# Patient Record
Sex: Female | Born: 1990 | Race: Black or African American | Hispanic: No | Marital: Married | Smoking: Never smoker
Health system: Southern US, Community
[De-identification: ages and names within clinical notes are randomized; demographics above are authoritative.]

## PROBLEM LIST (undated history)

## (undated) DIAGNOSIS — R87619 Unspecified abnormal cytological findings in specimens from cervix uteri: Secondary | ICD-10-CM

## (undated) DIAGNOSIS — F419 Anxiety disorder, unspecified: Secondary | ICD-10-CM

## (undated) DIAGNOSIS — E282 Polycystic ovarian syndrome: Secondary | ICD-10-CM

## (undated) DIAGNOSIS — K219 Gastro-esophageal reflux disease without esophagitis: Secondary | ICD-10-CM

## (undated) DIAGNOSIS — E119 Type 2 diabetes mellitus without complications: Secondary | ICD-10-CM

## (undated) DIAGNOSIS — N926 Irregular menstruation, unspecified: Secondary | ICD-10-CM

## (undated) HISTORY — PX: NO PAST SURGERIES: SHX2092

## (undated) HISTORY — DX: Anxiety disorder, unspecified: F41.9

## (undated) HISTORY — DX: Unspecified abnormal cytological findings in specimens from cervix uteri: R87.619

---
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
## 2008-01-24 ENCOUNTER — Emergency Department (HOSPITAL_COMMUNITY): Admission: EM | Admit: 2008-01-24 | Discharge: 2008-01-24 | Payer: Self-pay | Admitting: Emergency Medicine

## 2008-09-21 ENCOUNTER — Ambulatory Visit: Payer: Self-pay | Admitting: Women's Health

## 2008-10-12 ENCOUNTER — Ambulatory Visit: Payer: Self-pay | Admitting: Women's Health

## 2008-10-12 ENCOUNTER — Other Ambulatory Visit: Admission: RE | Admit: 2008-10-12 | Discharge: 2008-10-12 | Payer: Self-pay | Admitting: Gynecology

## 2008-10-12 ENCOUNTER — Encounter: Payer: Self-pay | Admitting: Women's Health

## 2008-11-09 ENCOUNTER — Ambulatory Visit: Payer: Self-pay | Admitting: Women's Health

## 2009-06-06 ENCOUNTER — Ambulatory Visit: Payer: Self-pay | Admitting: Women's Health

## 2010-01-23 ENCOUNTER — Ambulatory Visit: Payer: Self-pay | Admitting: Internal Medicine

## 2010-05-01 ENCOUNTER — Emergency Department (HOSPITAL_COMMUNITY): Admission: EM | Admit: 2010-05-01 | Discharge: 2010-05-01 | Payer: Self-pay | Admitting: Emergency Medicine

## 2010-11-15 LAB — RAPID STREP SCREEN (MED CTR MEBANE ONLY): Streptococcus, Group A Screen (Direct): NEGATIVE

## 2010-12-31 LAB — HM PAP SMEAR: HM Pap smear: ABNORMAL

## 2011-01-06 ENCOUNTER — Encounter: Payer: Self-pay | Admitting: *Deleted

## 2011-02-20 ENCOUNTER — Emergency Department (HOSPITAL_BASED_OUTPATIENT_CLINIC_OR_DEPARTMENT_OTHER)
Admission: EM | Admit: 2011-02-20 | Discharge: 2011-02-20 | Disposition: A | Discharge status: Home / Self Care | Payer: Self-pay | Attending: Emergency Medicine | Admitting: Emergency Medicine

## 2011-02-20 ENCOUNTER — Emergency Department (INDEPENDENT_AMBULATORY_CARE_PROVIDER_SITE_OTHER): Payer: Self-pay

## 2011-02-20 DIAGNOSIS — R059 Cough, unspecified: Secondary | ICD-10-CM

## 2011-02-20 DIAGNOSIS — J069 Acute upper respiratory infection, unspecified: Secondary | ICD-10-CM | POA: Insufficient documentation

## 2011-02-20 DIAGNOSIS — R0989 Other specified symptoms and signs involving the circulatory and respiratory systems: Secondary | ICD-10-CM

## 2011-02-20 DIAGNOSIS — J329 Chronic sinusitis, unspecified: Secondary | ICD-10-CM

## 2011-02-20 DIAGNOSIS — R05 Cough: Secondary | ICD-10-CM

## 2011-05-28 LAB — POCT URINALYSIS DIP (DEVICE)
Hgb urine dipstick: NEGATIVE
Protein, ur: NEGATIVE
Specific Gravity, Urine: 1.015
Urobilinogen, UA: 0.2
pH: 8.5 — ABNORMAL HIGH

## 2011-06-09 LAB — COMPREHENSIVE METABOLIC PANEL
ALT: 81 — ABNORMAL HIGH
AST: 53 — ABNORMAL HIGH
Alkaline Phosphatase: 77
CO2: 27
GFR calc Af Amer: >60
Glucose, Bld: 101 — ABNORMAL HIGH
Potassium: 3.6
Sodium: 138
Total Protein: 7.6

## 2011-06-09 LAB — DIFFERENTIAL
Basophils Relative: 0
Eosinophils Absolute: 0 — ABNORMAL LOW
Eosinophils Relative: 0
Monocytes Relative: 2 — ABNORMAL LOW
Neutrophils Relative %: 92 — ABNORMAL HIGH

## 2011-06-09 LAB — URINE MICROSCOPIC-ADD ON

## 2011-06-09 LAB — URINALYSIS, ROUTINE W REFLEX MICROSCOPIC
Glucose, UA: NEGATIVE
Hgb urine dipstick: NEGATIVE
Ketones, ur: 15 — AB
Protein, ur: 30 — AB

## 2011-06-09 LAB — CBC
Hemoglobin: 13.3
RBC: 4.84
RDW: 15

## 2011-06-09 LAB — POCT PREGNANCY, URINE
Operator id: 173591
Preg Test, Ur: NEGATIVE

## 2011-07-11 ENCOUNTER — Ambulatory Visit (INDEPENDENT_AMBULATORY_CARE_PROVIDER_SITE_OTHER): Payer: 59 | Admitting: Family

## 2011-07-11 ENCOUNTER — Encounter: Payer: Self-pay | Admitting: Family

## 2011-07-11 ENCOUNTER — Telehealth: Payer: Self-pay | Admitting: Family

## 2011-07-11 ENCOUNTER — Ambulatory Visit: Payer: Self-pay | Admitting: Family

## 2011-07-11 ENCOUNTER — Ambulatory Visit (HOSPITAL_BASED_OUTPATIENT_CLINIC_OR_DEPARTMENT_OTHER)
Admission: RE | Admit: 2011-07-11 | Discharge: 2011-07-11 | Disposition: A | Discharge status: Home / Self Care | Payer: 59 | Source: Ambulatory Visit | Attending: Family | Admitting: Family

## 2011-07-11 DIAGNOSIS — R635 Abnormal weight gain: Secondary | ICD-10-CM

## 2011-07-11 DIAGNOSIS — K219 Gastro-esophageal reflux disease without esophagitis: Secondary | ICD-10-CM

## 2011-07-11 DIAGNOSIS — R141 Gas pain: Secondary | ICD-10-CM | POA: Insufficient documentation

## 2011-07-11 DIAGNOSIS — R112 Nausea with vomiting, unspecified: Secondary | ICD-10-CM

## 2011-07-11 DIAGNOSIS — R142 Eructation: Secondary | ICD-10-CM | POA: Insufficient documentation

## 2011-07-11 DIAGNOSIS — F329 Major depressive disorder, single episode, unspecified: Secondary | ICD-10-CM

## 2011-07-11 DIAGNOSIS — R1013 Epigastric pain: Secondary | ICD-10-CM | POA: Insufficient documentation

## 2011-07-11 DIAGNOSIS — Z0000 Encounter for general adult medical examination without abnormal findings: Secondary | ICD-10-CM

## 2011-07-11 DIAGNOSIS — J45909 Unspecified asthma, uncomplicated: Secondary | ICD-10-CM | POA: Insufficient documentation

## 2011-07-11 DIAGNOSIS — R109 Unspecified abdominal pain: Secondary | ICD-10-CM

## 2011-07-11 DIAGNOSIS — F3289 Other specified depressive episodes: Secondary | ICD-10-CM

## 2011-07-11 DIAGNOSIS — F32A Depression, unspecified: Secondary | ICD-10-CM | POA: Insufficient documentation

## 2011-07-11 DIAGNOSIS — R87619 Unspecified abnormal cytological findings in specimens from cervix uteri: Secondary | ICD-10-CM | POA: Insufficient documentation

## 2011-07-11 DIAGNOSIS — E282 Polycystic ovarian syndrome: Secondary | ICD-10-CM | POA: Insufficient documentation

## 2011-07-11 HISTORY — DX: Unspecified abnormal cytological findings in specimens from cervix uteri: R87.619

## 2011-07-11 LAB — CBC WITH DIFFERENTIAL/PLATELET
Basophils Relative: 0 % (ref 0–1)
Eosinophils Absolute: 0.1 10*3/uL (ref 0.0–0.7)
Eosinophils Relative: 2 % (ref 0–5)
MCH: 26 pg (ref 26.0–34.0)
MCHC: 31.4 g/dL (ref 30.0–36.0)
MCV: 82.6 fL (ref 78.0–100.0)
Neutrophils Relative %: 48 % (ref 43–77)
Platelets: 356 10*3/uL (ref 150–400)
RDW: 14.7 % (ref 11.5–15.5)

## 2011-07-11 MED ORDER — OMEPRAZOLE 40 MG PO CPDR: 40.0000 mg | DELAYED_RELEASE_CAPSULE | Freq: Every day | ORAL | Status: DC

## 2011-07-11 MED ORDER — ALBUTEROL 90 MCG/ACT IN AERS: 2.0000 | INHALATION_SPRAY | Freq: Four times a day (QID) | RESPIRATORY_TRACT | Status: DC | PRN

## 2011-07-11 NOTE — Telephone Encounter (Signed)
Pls let patient know that her ultrasound is normal.  I am still waiting on her blood work.  She should start omeprazole rx and I will make arrangement for her to see GI to further evaluate her pain.

## 2011-07-11 NOTE — Progress Notes (Signed)
Subjective:    Patient ID: Melissa [name], female    DOB: 10-10-1990, 20 y.o.   MRN: [id number]  HPI  Ms.  Melissa [name] is a 20 yr old female who presents today with chief complaint of RUQ pain.  1) RUQ pain-  She feels like the food "just sits."  She reports "sour spit."  Symptoms started about 2-3 weeks ago.  She has tried pepcid Customer service manager and tums.  "nothing is helping." Symptoms are worsening.  She reports that several female family members have had cholecystectomy.  Denies fever.  She has had some associated nausea and vomitting.   She has not had a primary care.  She has followed at [facility]) in [place].    2) Abnormal Pap smear- this was performed by GYN.  They recommended that she follow up but she tells me that she did not because she was "scared."  3)PCOS- she was diagnosed in 2008.  She has been on Metformin since that time.  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  4) Asthma-  She reports that this is well controlled. Exercised induced.  Uses Albuterol MDI PRN.  5) Depression-   She reports that she has been trying to get pregnant since last year.  She is upset about her female issues.  Feels that her depression is situational.  She denies suicidal ideation.  She reports feeling bad about her hirsuitism.    6) + weight gain-  50 pound weight gain in the last 8 months.     Review of Systems  Constitutional: Positive for unexpected weight change.  HENT: Negative for hearing loss.   Eyes: Negative for visual disturbance.  Respiratory: Negative for shortness of breath.   Cardiovascular: Negative for leg swelling.  Gastrointestinal: Positive for abdominal pain.  Genitourinary:       She notes occasional urinary incontinence.    Musculoskeletal: Negative for myalgias and arthralgias.  Skin: Negative for rash.  Neurological: Negative for headaches.  Hematological: Negative for adenopathy.   Past Medical History  Diagnosis Date  . Asthma     activity  induced  . Depression     History   Social History  . Marital Status: Married    Spouse Name: N/A    Number of Children: 0  . Years of Education: N/A   Occupational History  . Not on file.   Social History Main Topics  . Smoking status: Never Smoker   . Smokeless tobacco: Never Used  . Alcohol Use: No  . Drug Use: No  . Sexually Active: Not on file   Other Topics Concern  . Not on file   Social History Narrative   Caffeine use:  1 can soda dailyRegular exercise:  Just started2 sisters.  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 Denies drugs, smoking, or ETOH.    Past Surgical History  Procedure Date  . No past surgeries     Family History  Problem Relation Age of Onset  . Gallbladder disease Mother   . Hypertension Father   . Other Father     Hepatitis A  . Cancer Maternal Grandmother     stomach cancer  . Emphysema Maternal Grandfather   . Heart disease Paternal Grandfather     No Known Allergies  No current outpatient prescriptions on file prior to visit.    BP 100/70  Pulse 66  Temp(Src) 97.8 F (36.6 C) (Oral)  Resp 16  Ht 5\' 3"  (1.6 m)  Wt 210 lb 0.6 oz (95.274 kg)  BMI 37.21 kg/m2  LMP 06/19/2011        Objective:   Physical Exam  Constitutional: She is oriented to person, place, and time. She appears well-developed and well-nourished. No distress.  HENT:  Head: Normocephalic and atraumatic.  Eyes: Conjunctivae are normal. Pupils are equal, round, and reactive to light.  Neck: Normal range of motion. Neck supple. No thyromegaly present.  Cardiovascular: Normal rate and regular rhythm.   No murmur heard. Pulmonary/Chest: Effort normal and breath sounds normal. No respiratory distress. She has no wheezes. She has no rales. She exhibits no tenderness.  Abdominal: Soft. Bowel sounds are normal.       + epigastric tenderness.  + RUQ tenderness    Musculoskeletal: She exhibits no edema.  Neurological: She is alert and oriented to person, place, and time.  Skin: Skin is warm and dry.  Psychiatric:       Slightly flat affect, but appropriate and pleasant.          Assessment & Plan:

## 2011-07-11 NOTE — Assessment & Plan Note (Signed)
I recommended to the patient that she arrange follow up with GYN as recommended.

## 2011-07-11 NOTE — Assessment & Plan Note (Signed)
Lab work pending, ultrasound shows normal gallbladder.  Suspect GERD.  Add PPI and plan to refer to GI as she may also need endoscopy to rule out PUD.

## 2011-07-11 NOTE — Telephone Encounter (Signed)
Call placed to patient [phone], she was advised per Sandford Craze instructions and has verbalized understanding. She is aware the Rx for Omeprazole was sent to pharmacy.

## 2011-07-11 NOTE — Assessment & Plan Note (Signed)
Pt feels that her depression is largely situation due to "female issues" i.e. Infertility, hirsuitism.  I recommended that she see a therapist which she declines at this time.

## 2011-07-11 NOTE — Assessment & Plan Note (Signed)
Exercise induced. Controlled.  Continue PRN albuterol.

## 2011-07-11 NOTE — Patient Instructions (Signed)
Please complete your lab work prior to leaving today. Please schedule your ultrasound on the first floor today. Follow up in 1 month.

## 2011-07-11 NOTE — Assessment & Plan Note (Signed)
Pt is on metformin.  Concerned about fertility issues.  These issues are being managed by OB/GYN.

## 2011-07-11 NOTE — Assessment & Plan Note (Signed)
Check TSH 

## 2011-07-12 LAB — BASIC METABOLIC PANEL
BUN: 9 mg/dL (ref 6–23)
CO2: 28 mEq/L (ref 19–32)
Calcium: 9.1 mg/dL (ref 8.4–10.5)
Chloride: 103 mEq/L (ref 96–112)
Creat: 0.68 mg/dL (ref 0.50–1.10)
Glucose, Bld: 80 mg/dL (ref 70–99)

## 2011-07-12 LAB — TSH: TSH: 1.973 u[IU]/mL (ref 0.350–4.500)

## 2011-07-12 LAB — HEPATIC FUNCTION PANEL
Albumin: 4.5 g/dL (ref 3.5–5.2)
Alkaline Phosphatase: 72 U/L (ref 39–117)
Total Bilirubin: 0.2 mg/dL — ABNORMAL LOW (ref 0.3–1.2)
Total Protein: 7.3 g/dL (ref 6.0–8.3)

## 2011-07-13 ENCOUNTER — Encounter: Payer: Self-pay | Admitting: Family

## 2011-07-14 ENCOUNTER — Ambulatory Visit: Payer: Self-pay | Admitting: Family

## 2011-08-04 ENCOUNTER — Ambulatory Visit: Payer: 59 | Admitting: Gastroenterology

## 2011-08-11 ENCOUNTER — Ambulatory Visit: Payer: 59 | Admitting: Family

## 2012-01-27 ENCOUNTER — Encounter (HOSPITAL_COMMUNITY): Payer: Self-pay

## 2012-01-27 ENCOUNTER — Emergency Department (HOSPITAL_COMMUNITY)
Admission: EM | Admit: 2012-01-27 | Discharge: 2012-01-27 | Disposition: A | Discharge status: Home / Self Care | Payer: 59 | Attending: Emergency Medicine | Admitting: Emergency Medicine

## 2012-01-27 DIAGNOSIS — K529 Noninfective gastroenteritis and colitis, unspecified: Secondary | ICD-10-CM

## 2012-01-27 DIAGNOSIS — N390 Urinary tract infection, site not specified: Secondary | ICD-10-CM | POA: Insufficient documentation

## 2012-01-27 DIAGNOSIS — K5289 Other specified noninfective gastroenteritis and colitis: Secondary | ICD-10-CM | POA: Insufficient documentation

## 2012-01-27 HISTORY — DX: Polycystic ovarian syndrome: E28.2

## 2012-01-27 HISTORY — DX: Gastro-esophageal reflux disease without esophagitis: K21.9

## 2012-01-27 HISTORY — DX: Irregular menstruation, unspecified: N92.6

## 2012-01-27 LAB — CBC
HCT: 37.9 % (ref 36.0–46.0)
Hemoglobin: 12.5 g/dL (ref 12.0–15.0)
MCHC: 33 g/dL (ref 30.0–36.0)
RDW: 14.9 % (ref 11.5–15.5)
WBC: 5.4 10*3/uL (ref 4.0–10.5)

## 2012-01-27 LAB — DIFFERENTIAL
Basophils Absolute: 0 10*3/uL (ref 0.0–0.1)
Basophils Relative: 0 % (ref 0–1)
Lymphocytes Relative: 45 % (ref 12–46)
Monocytes Absolute: 0.6 10*3/uL (ref 0.1–1.0)
Monocytes Relative: 10 % (ref 3–12)
Neutro Abs: 2.4 10*3/uL (ref 1.7–7.7)
Neutrophils Relative %: 44 % (ref 43–77)

## 2012-01-27 LAB — URINALYSIS, ROUTINE W REFLEX MICROSCOPIC
Nitrite: NEGATIVE
Protein, ur: 30 mg/dL — AB
Specific Gravity, Urine: 1.036 — ABNORMAL HIGH (ref 1.005–1.030)
Urobilinogen, UA: 1 mg/dL (ref 0.0–1.0)

## 2012-01-27 LAB — COMPREHENSIVE METABOLIC PANEL
ALT: 16 U/L (ref 0–35)
BUN: 11 mg/dL (ref 6–23)
CO2: 23 mEq/L (ref 19–32)
Calcium: 9.1 mg/dL (ref 8.4–10.5)
Chloride: 102 mEq/L (ref 96–112)
Creatinine, Ser: 0.65 mg/dL (ref 0.50–1.10)
GFR calc Af Amer: >90 mL/min (ref >90)
Potassium: 3.4 mEq/L — ABNORMAL LOW (ref 3.5–5.1)
Sodium: 139 mEq/L (ref 135–145)
Total Protein: 7.6 g/dL (ref 6.0–8.3)

## 2012-01-27 LAB — URINE MICROSCOPIC-ADD ON

## 2012-01-27 LAB — POCT PREGNANCY, URINE: Preg Test, Ur: NEGATIVE

## 2012-01-27 LAB — LIPASE, BLOOD: Lipase: 32 U/L (ref 11–59)

## 2012-01-27 MED ORDER — CEPHALEXIN 500 MG PO CAPS: 500.0000 mg | ORAL_CAPSULE | Freq: Four times a day (QID) | ORAL | Status: AC

## 2012-01-27 MED ORDER — SODIUM CHLORIDE 0.9 % IV SOLN
INTRAVENOUS | Status: DC
  Administered 2012-01-27 (×2): via INTRAVENOUS

## 2012-01-27 MED ORDER — PROMETHAZINE HCL 25 MG RE SUPP: 25.0000 mg | Freq: Four times a day (QID) | RECTAL | Status: DC | PRN

## 2012-01-27 MED ORDER — ONDANSETRON HCL 4 MG/2ML IJ SOLN
4.0000 mg | Freq: Once | INTRAMUSCULAR | Status: AC
  Administered 2012-01-27: 4 mg via INTRAVENOUS
  Filled 2012-01-27: qty 2

## 2012-01-27 MED ORDER — ONDANSETRON HCL 4 MG/2ML IJ SOLN
4.0000 mg | INTRAMUSCULAR | Status: DC | PRN
  Administered 2012-01-27: 4 mg via INTRAVENOUS
  Filled 2012-01-27: qty 2

## 2012-01-27 MED ORDER — DEXTROSE 5 % IV SOLN
1.0000 g | Freq: Once | INTRAVENOUS | Status: AC
  Administered 2012-01-27: 1 g via INTRAVENOUS
  Filled 2012-01-27: qty 10

## 2012-01-27 MED ORDER — SODIUM CHLORIDE 0.9 % IV BOLUS (SEPSIS)
2000.0000 mL | Freq: Once | INTRAVENOUS | Status: AC
  Administered 2012-01-27: 2000 mL via INTRAVENOUS

## 2012-01-27 NOTE — ED Notes (Signed)
Pa in to discuss results and discharge plan with with pt and family

## 2012-01-27 NOTE — ED Provider Notes (Signed)
Patient  was placed in CDU on observation by Dr. Fonnie Jarvis. Patient care resumed from blue side .  Patient is here for fluid hydration with labs pending and has received 1Lbolus. While in obeservation over night the pt slept well and had no complaints, per nursing staff. Patient re-evaluated and is resting comfortable, VSS, with no new complaints or concerns at this time. Plan per previous provider is to dc pt id can tolerate PO challenge. On exam: hemodynamically stable, NAD, heart w/ RRR, lungs CTAB, Chest & abd diffuse mild tenderness, no peripheral edema or calf tenderness.  12:01 PM  Patient with symptoms consistent with viral gastroenteritis.  Vitals are stable, no fever.  No signs of dehydration, tolerating PO fluids > 6 oz.  Lungs are clear.  No focal abdominal pain, no concern for appendicitis, cholecystitis, pancreatitis, ruptured viscus, UTI, kidney stone, or any other abdominal etiology.  Supportive therapy indicated with return if symptoms worsen.  Patient counseled. In addition Pt has been diagnosed with a UTI. Pt is afebrile, no CVA tenderness, normotensive, and denies N/V. Pt has been given 1 g rocephin & will to be dc home with antibiotics and instructions to follow up with PCP if symptoms persist.       Jaci Carrel, PA-C 01/27/12 1202

## 2012-01-27 NOTE — ED Notes (Signed)
Pt family has returned and is requesting soda etc.

## 2012-01-27 NOTE — ED Notes (Signed)
IV attempt x 2 unsuccessful.

## 2012-01-27 NOTE — ED Notes (Signed)
Patient has arrived in cdu. Her iv fluids have been placed on pump. Site asymptomatic. States still with some nausea, last emesis around 4 am, last diarrhea around 8 am states stool liquid yellow. States she did not feel well Saturday. States onset vomiting and diarrhea Sunday evening. States a lot of vomiting and diarrhea yesterday. States has had dry heaves this morning. States at this time she does feel some better. Patient has ambulated to the restroom and collected urine sample. Sample cloudy. Pt made comfortable in room,.

## 2012-01-27 NOTE — Discharge Instructions (Signed)
Follow up with your primary care doctor about your hospital visit. Continue to hydrate orally.Take all medications as prescribed & use Zofran as directed for nausea & vomiting.  Read the instructions below for reasons to return to the ER.  ° °The 'BRAT' diet is suggested, then progress to diet as tolerated as symptoms abate. Call if bloody stools, persistent diarrhea, vomiting, fever or abdominal pain. °Bananas.  °Rice.  °Applesauce.  °Toast (and other simple starches such as crackers, potatoes, noodles).  ° °SEEK IMMEDIATE MEDICAL ATTENTION IF: ° °You begin having localized abdominal pain that does not go away or becomes severe (The right side could  possibly be appendicitis. In an adult, the left lower portion of the abdomen could be colitis or diverticulitis) °  °A temperature above 101 develops ° °Repeated vomiting occurs (multiple uncontrollable episodes) or you are unable to keep fluids down ° °Blood is being passed in stools or vomit (bright red or black tarry stools).  ° °Return also if you develop chest pain, difficulty breathing, dizziness or fainting, or become confused, poorly responsive, or inconsolable (young children). ° ° °RESOURCE GUIDE ° °Dental Problems ° °Patients with Medicaid: °[facility]                     [facility] °[address]                                           [address] °Phone:  [phone]                                                  Phone:  510-2600 ° °If unable to pay or uninsured, contact:  [facility] or [facility] to become qualified for the adult dental clinic. ° °Chronic Pain Problems °Contact [facility]  [phone] °Patients need to be referred by their primary care doctor. ° °Insufficient Money for Medicine °Contact [employer]:  call "211" or [facility] 271-5999. ° °No Primary Care Doctor °Call [facility]  832-8000 °Other agencies that provide inexpensive medical care °   [facility]  [phone] °   [facility]  [phone] °   [facility]  [phone] °   [facility]  [phone] °   [facility]  [phone] °   [facility]  [phone] ° °Psychological Services °[facility]  [phone] °[facility]  [phone] °[facility]   [phone] (emergency services [phone]) ° °Substance Abuse Resources °[facility]  336-882-2125 °[facility] 336-784-9470 °[facility] 336-285-9073 °[facility] 336-845-3988 °Residential & Outpatient Substance Abuse Program  800-659-3381 ° °Abuse/Neglect °[place] [facility] (336) 641-3795 °[place] [facility] Hotline 800-378-5315 (After Hours) ° °Emergency Shelter °[facility] (336) 271-5985 ° °Maternity Homes °[facility] (336) 275-9566 °[facility] (704) 372-4663 ° °MRSA Hotline #:   832-7006 ° ° ° °[place] [facility] ° °[facility]     [employer]                            [facility] °[address] [place]                       [address]      [address]  °[place]                                                [place]                            [place] °Phone:  349-3220                                   Phone:  [phone]                 Phone:  342-8140 ° °[facility] °Phone:  342-8316 ° °[place] [facility] Hotline °(336) 342-1394 °(336) 342-3537 (After Hours) ° ° ° ° °

## 2012-01-27 NOTE — ED Notes (Signed)
Dr. Bednar at bedside. 

## 2012-01-27 NOTE — ED Notes (Signed)
Family at bedside. 

## 2012-01-27 NOTE — ED Provider Notes (Signed)
History     CSN: [id number]  Arrival date & time 01/27/12  2130   First MD Initiated Contact with Patient 01/27/12 [phone]      Chief Complaint  Patient presents with  . Emesis  . Diarrhea    (Consider location/radiation/quality/duration/timing/severity/associated sxs/prior treatment) HPI This 21 year old female has had nonbloody vomiting and diarrhea several times a day for the last couple of days and today is day 3 of her illness with intermittent diffuse crampy abdominal pains mostly in the epigastric area. She is no fever no body aches no confusion no rash no shortness of breath no vaginal discharge no vaginal bleeding no dysuria. There is no treatment prior to arrival other than attempting to swallow Zofran tablets which she vomited and was not able to hold down. Today she feels as if she has a dry mouth and is a little bit lightheaded and generally weak and dehydrated so came to the ED for rehydration. Past Medical History  Diagnosis Date  . Asthma     activity induced  . Abnormal Pap smear of cervix 07/11/2011  . Polycystic ovarian syndrome   . Irregular menses   . Acid reflux     Past Surgical History  Procedure Date  . No past surgeries     Family History  Problem Relation Age of Onset  . Gallbladder disease Mother   . Hypertension Father   . Other Father     Hepatitis A  . Cancer Maternal Grandmother     stomach cancer  . Emphysema Maternal Grandfather   . Heart disease Paternal Grandfather     History  Substance Use Topics  . Smoking status: Never Smoker   . Smokeless tobacco: Never Used  . Alcohol Use: No    OB History    Grav Para Term Preterm Abortions TAB SAB Ect Mult Living                  Review of Systems  Constitutional: Negative for fever.       10 Systems reviewed and are negative for acute change except as noted in the HPI.  HENT: Negative for congestion.   Eyes: Negative for discharge and redness.  Respiratory: Negative for cough and  shortness of breath.   Cardiovascular: Negative for chest pain.  Gastrointestinal: Positive for nausea, vomiting, abdominal pain and diarrhea.  Genitourinary: Negative for dysuria, vaginal bleeding and vaginal discharge.  Musculoskeletal: Negative for back pain.  Skin: Negative for rash.  Neurological: Negative for syncope, numbness and headaches.  Psychiatric/Behavioral:       No behavior change.    Allergies  Benadryl  Home Medications   Current Outpatient Rx  Name Route Sig Dispense Refill  . LETROZOLE 2.5 MG PO TABS Oral Take 5 mg by mouth daily.    Marland Kitchen OMEPRAZOLE 40 MG PO CPDR Oral Take 1 capsule (40 mg total) by mouth daily. 30 capsule 2  . ALBUTEROL 90 MCG/ACT IN AERS Inhalation Inhale 2 puffs into the lungs every 6 (six) hours as needed for wheezing. 17 g 2  . CEPHALEXIN 500 MG PO CAPS Oral Take 1 capsule (500 mg total) by mouth 4 (four) times daily. 40 capsule 0  . PROMETHAZINE HCL 25 MG RE SUPP Rectal Place 1 suppository (25 mg total) rectally every 6 (six) hours as needed for nausea. 12 each 0    BP 107/72  Pulse 75  Temp(Src) 98.1 F (36.7 C) (Oral)  Resp 16  Ht 5\' 2"  (1.575 m)  Wt  211 lb (95.709 kg)  BMI 38.59 kg/m2  SpO2 100%  LMP 01/17/2012  Physical Exam  Nursing note and vitals reviewed. Constitutional:       Awake, alert, nontoxic appearance.  HENT:  Head: Atraumatic.  Eyes: Right eye exhibits no discharge. Left eye exhibits no discharge.  Neck: Neck supple.  Cardiovascular: Normal rate and regular rhythm.   No murmur heard. Pulmonary/Chest: Effort normal and breath sounds normal. No respiratory distress. She has no wheezes. She has no rales. She exhibits no tenderness.  Abdominal: Soft. Bowel sounds are normal. She exhibits no mass. There is tenderness. There is no rebound and no guarding.       Minimal diffuse tenderness with minimal to mild tenderness in the epigastrium  Genitourinary:       No CVA tenderness  Musculoskeletal: She exhibits no  edema and no tenderness.       Baseline ROM, no obvious new focal weakness.  Neurological:       Mental status and motor strength appears baseline for patient and situation.  Skin: No rash noted.  Psychiatric: She has a normal mood and affect.    ED Course  Procedures (including critical care time) Move to CDU labs pnd. Labs Reviewed  CBC - Abnormal; Notable for the following:    MCH 25.7 (*)    All other components within normal limits  COMPREHENSIVE METABOLIC PANEL - Abnormal; Notable for the following:    Potassium 3.4 (*)    Total Bilirubin 0.2 (*)    All other components within normal limits  URINALYSIS, ROUTINE W REFLEX MICROSCOPIC - Abnormal; Notable for the following:    Color, Urine AMBER (*) BIOCHEMICALS MAY BE AFFECTED BY COLOR   APPearance CLOUDY (*)    Specific Gravity, Urine 1.036 (*)    Hgb urine dipstick TRACE (*)    Bilirubin Urine MODERATE (*)    Ketones, ur 15 (*)    Protein, ur 30 (*)    Leukocytes, UA MODERATE (*)    All other components within normal limits  URINE MICROSCOPIC-ADD ON - Abnormal; Notable for the following:    Squamous Epithelial / LPF MANY (*)    Bacteria, UA FEW (*)    All other components within normal limits  DIFFERENTIAL  LIPASE, BLOOD  POCT PREGNANCY, URINE  LAB REPORT - SCANNED   No results found.   1. UTI (lower urinary tract infection)   2. Gastroenteritis       MDM  Patient / Family / Caregiver understand and agree with initial ED impression and plan with expectations set for ED visit.  Medical screening examination/treatment/procedure(s) were conducted as a shared visit with non-physician practitioner(s) and myself.  I personally evaluated the patient during the encounter      Hurman Horn, MD 01/29/12 [phone]

## 2012-01-27 NOTE — ED Notes (Signed)
Pt sts she has been vomiting and having diarrhea since Sunday. Unable to keep liquids down. Unsure if running a fever. States that she sweats a lot at night while she is sleeping. Cramping pain in stomach from vomiting or diarrhea.

## 2012-01-29 NOTE — ED Provider Notes (Signed)
Medical screening examination/treatment/procedure(s) were conducted as a shared visit with non-physician practitioner(s) and myself.  I personally evaluated the patient during the encounter    Hurman Horn, MD 01/29/12 [phone]

## 2012-08-10 ENCOUNTER — Ambulatory Visit: Payer: 59 | Admitting: Family

## 2012-08-11 ENCOUNTER — Ambulatory Visit: Payer: 59 | Admitting: Family

## 2012-10-03 ENCOUNTER — Encounter (HOSPITAL_BASED_OUTPATIENT_CLINIC_OR_DEPARTMENT_OTHER): Payer: Self-pay | Admitting: *Deleted

## 2012-10-03 ENCOUNTER — Emergency Department (HOSPITAL_BASED_OUTPATIENT_CLINIC_OR_DEPARTMENT_OTHER)
Admission: EM | Admit: 2012-10-03 | Discharge: 2012-10-03 | Disposition: A | Discharge status: Home / Self Care | Payer: 59 | Attending: Emergency Medicine | Admitting: Emergency Medicine

## 2012-10-03 DIAGNOSIS — E282 Polycystic ovarian syndrome: Secondary | ICD-10-CM | POA: Insufficient documentation

## 2012-10-03 DIAGNOSIS — R42 Dizziness and giddiness: Secondary | ICD-10-CM | POA: Insufficient documentation

## 2012-10-03 DIAGNOSIS — R112 Nausea with vomiting, unspecified: Secondary | ICD-10-CM

## 2012-10-03 DIAGNOSIS — Z3202 Encounter for pregnancy test, result negative: Secondary | ICD-10-CM | POA: Insufficient documentation

## 2012-10-03 DIAGNOSIS — R51 Headache: Secondary | ICD-10-CM | POA: Insufficient documentation

## 2012-10-03 DIAGNOSIS — K219 Gastro-esophageal reflux disease without esophagitis: Secondary | ICD-10-CM | POA: Insufficient documentation

## 2012-10-03 DIAGNOSIS — N946 Dysmenorrhea, unspecified: Secondary | ICD-10-CM | POA: Insufficient documentation

## 2012-10-03 DIAGNOSIS — J45909 Unspecified asthma, uncomplicated: Secondary | ICD-10-CM | POA: Insufficient documentation

## 2012-10-03 DIAGNOSIS — Z79899 Other long term (current) drug therapy: Secondary | ICD-10-CM | POA: Insufficient documentation

## 2012-10-03 DIAGNOSIS — R1013 Epigastric pain: Secondary | ICD-10-CM | POA: Insufficient documentation

## 2012-10-03 LAB — URINALYSIS, ROUTINE W REFLEX MICROSCOPIC
Bilirubin Urine: NEGATIVE
Ketones, ur: NEGATIVE mg/dL
Leukocytes, UA: NEGATIVE
Nitrite: NEGATIVE
Protein, ur: 30 mg/dL — AB
pH: 8.5 — ABNORMAL HIGH (ref 5.0–8.0)

## 2012-10-03 LAB — CBC WITH DIFFERENTIAL/PLATELET
Eosinophils Absolute: 0 10*3/uL (ref 0.0–0.7)
Eosinophils Relative: 0 % (ref 0–5)
Lymphs Abs: 1.5 10*3/uL (ref 0.7–4.0)
MCH: 26.2 pg (ref 26.0–34.0)
MCHC: 33.2 g/dL (ref 30.0–36.0)
MCV: 78.7 fL (ref 78.0–100.0)
Monocytes Relative: 6 % (ref 3–12)
Platelets: 362 10*3/uL (ref 150–400)
RBC: 4.89 MIL/uL (ref 3.87–5.11)

## 2012-10-03 LAB — COMPREHENSIVE METABOLIC PANEL
BUN: 10 mg/dL (ref 6–23)
Calcium: 9.4 mg/dL (ref 8.4–10.5)
GFR calc Af Amer: >90 mL/min (ref >90)
Glucose, Bld: 97 mg/dL (ref 70–99)
Sodium: 143 mEq/L (ref 135–145)
Total Protein: 8.2 g/dL (ref 6.0–8.3)

## 2012-10-03 LAB — LIPASE, BLOOD: Lipase: 21 U/L (ref 11–59)

## 2012-10-03 LAB — URINE MICROSCOPIC-ADD ON

## 2012-10-03 LAB — PREGNANCY, URINE: Preg Test, Ur: NEGATIVE

## 2012-10-03 MED ORDER — FAMOTIDINE IN NACL 20-0.9 MG/50ML-% IV SOLN
20.0000 mg | Freq: Once | INTRAVENOUS | Status: AC
  Administered 2012-10-03: 20 mg via INTRAVENOUS
  Filled 2012-10-03: qty 50

## 2012-10-03 MED ORDER — SODIUM CHLORIDE 0.9 % IV BOLUS (SEPSIS)
1000.0000 mL | Freq: Once | INTRAVENOUS | Status: AC
  Administered 2012-10-03: 1000 mL via INTRAVENOUS

## 2012-10-03 MED ORDER — ONDANSETRON 8 MG PO TBDP: 8.0000 mg | ORAL_TABLET | Freq: Three times a day (TID) | ORAL | Status: DC | PRN

## 2012-10-03 MED ORDER — ONDANSETRON HCL 4 MG/2ML IJ SOLN
4.0000 mg | Freq: Once | INTRAMUSCULAR | Status: AC
  Administered 2012-10-03: 4 mg via INTRAVENOUS
  Filled 2012-10-03: qty 2

## 2012-10-03 NOTE — ED Notes (Signed)
Pt states she drank alcohol last night and woke up this a.m. vomiting

## 2012-10-03 NOTE — ED Provider Notes (Signed)
History     CSN: [id number]  Arrival date & time 10/03/12  1423   First MD Initiated Contact with Patient 10/03/12 1436      Chief Complaint  Patient presents with  . Emesis    (Consider location/radiation/quality/duration/timing/severity/associated sxs/prior treatment) HPI Melissa [name] is a 22 y.o. female who presents to ED complaining of nausea, vomiting, abdominal pain. States symptoms began this morning when she woke up. Pt admits to drinking alcohol lastnight. States normally does not drink. Had 3 martinis at dinner and two vodka cocktails after. State doe snot remember what happened after this. This morning states nauseated, unable to eat or drink, states upper abdomina pain. States has hx of indigestion and hx of gallbladder problems. States no surgeries. States tried taking phenergan, but vomited.   Past Medical History  Diagnosis Date  . Asthma     activity induced  . Abnormal Pap smear of cervix 07/11/2011  . Polycystic ovarian syndrome   . Irregular menses   . Acid reflux     Past Surgical History  Procedure Date  . No past surgeries     Family History  Problem Relation Age of Onset  . Gallbladder disease Mother   . Hypertension Father   . Other Father     Hepatitis A  . Cancer Maternal Grandmother     stomach cancer  . Emphysema Maternal Grandfather   . Heart disease Paternal Grandfather     History  Substance Use Topics  . Smoking status: Never Smoker   . Smokeless tobacco: Never Used  . Alcohol Use: No    OB History    Grav Para Term Preterm Abortions TAB SAB Ect Mult Living                  Review of Systems  Constitutional: Negative for fever and chills.  Respiratory: Negative.   Cardiovascular: Negative.   Gastrointestinal: Positive for nausea, vomiting and abdominal pain. Negative for diarrhea.  Genitourinary: Negative for dysuria and flank pain.  Musculoskeletal: Positive for myalgias.  Skin: Negative.   Neurological: Positive for  dizziness, light-headedness and headaches.    Allergies  Benadryl  Home Medications   Current Outpatient Rx  Name  Route  Sig  Dispense  Refill  . ALBUTEROL 90 MCG/ACT IN AERS   Inhalation   Inhale 2 puffs into the lungs every 6 (six) hours as needed for wheezing.   17 g   2   . LETROZOLE 2.5 MG PO TABS   Oral   Take 5 mg by mouth daily.         Marland Kitchen OMEPRAZOLE 40 MG PO CPDR   Oral   Take 1 capsule (40 mg total) by mouth daily.   30 capsule   2   . PROMETHAZINE HCL 25 MG RE SUPP   Rectal   Place 1 suppository (25 mg total) rectally every 6 (six) hours as needed for nausea.   12 each   0     BP 126/77  Pulse 94  Temp 98.5 F (36.9 C) (Oral)  Resp 18  Ht 5' 1.75" (1.568 m)  Wt 209 lb (94.802 kg)  BMI 38.54 kg/m2  SpO2 99%  LMP 07/03/2012  Physical Exam  Nursing note and vitals reviewed. Constitutional: She is oriented to person, place, and time. She appears well-developed and well-nourished. No distress.  HENT:  Head: Normocephalic and atraumatic.  Neck: Neck supple.  Cardiovascular: Normal rate, regular rhythm and normal heart sounds.   Pulmonary/Chest:  Effort normal and breath sounds normal. No respiratory distress. She has no wheezes. She has no rales.  Abdominal: Soft. Bowel sounds are normal. She exhibits no distension. There is tenderness. There is no rebound and no guarding.       Epigastric tenderenss  Musculoskeletal: She exhibits no edema.  Neurological: She is alert and oriented to person, place, and time.  Skin: Skin is warm and dry.    ED Course  Procedures (including critical care time)  Pt with epigastric abdominal pain. Alcohol intake last night, more than usual. Hx of gallstones.  Will get labs. Fluids. zofran IV.   Results for orders placed during the hospital encounter of 10/03/12  URINALYSIS, ROUTINE W REFLEX MICROSCOPIC      Component Value Range   Color, Urine YELLOW  YELLOW   APPearance CLOUDY (*) CLEAR   Specific Gravity,  Urine 1.019  1.005 - 1.030   pH 8.5 (*) 5.0 - 8.0   Glucose, UA NEGATIVE  NEGATIVE mg/dL   Hgb urine dipstick NEGATIVE  NEGATIVE   Bilirubin Urine NEGATIVE  NEGATIVE   Ketones, ur NEGATIVE  NEGATIVE mg/dL   Protein, ur 30 (*) NEGATIVE mg/dL   Urobilinogen, UA 0.2  0.0 - 1.0 mg/dL   Nitrite NEGATIVE  NEGATIVE   Leukocytes, UA NEGATIVE  NEGATIVE  PREGNANCY, URINE      Component Value Range   Preg Test, Ur NEGATIVE  NEGATIVE  CBC WITH DIFFERENTIAL      Component Value Range   WBC 7.8  4.0 - 10.5 K/uL   RBC 4.89  3.87 - 5.11 MIL/uL   Hemoglobin 12.8  12.0 - 15.0 g/dL   HCT 16.1  09.6 - 04.5 %   MCV 78.7  78.0 - 100.0 fL   MCH 26.2  26.0 - 34.0 pg   MCHC 33.2  30.0 - 36.0 g/dL   RDW 40.9  81.1 - 91.4 %   Platelets 362  150 - 400 K/uL   Neutrophils Relative 75  43 - 77 %   Neutro Abs 5.9  1.7 - 7.7 K/uL   Lymphocytes Relative 19  12 - 46 %   Lymphs Abs 1.5  0.7 - 4.0 K/uL   Monocytes Relative 6  3 - 12 %   Monocytes Absolute 0.4  0.1 - 1.0 K/uL   Eosinophils Relative 0  0 - 5 %   Eosinophils Absolute 0.0  0.0 - 0.7 K/uL   Basophils Relative 0  0 - 1 %   Basophils Absolute 0.0  0.0 - 0.1 K/uL  COMPREHENSIVE METABOLIC PANEL      Component Value Range   Sodium 143  135 - 145 mEq/L   Potassium 3.8  3.5 - 5.1 mEq/L   Chloride 104  96 - 112 mEq/L   CO2 26  19 - 32 mEq/L   Glucose, Bld 97  70 - 99 mg/dL   BUN 10  6 - 23 mg/dL   Creatinine, Ser 7.82  0.50 - 1.10 mg/dL   Calcium 9.4  8.4 - 95.6 mg/dL   Total Protein 8.2  6.0 - 8.3 g/dL   Albumin 4.3  3.5 - 5.2 g/dL   AST 18  0 - 37 U/L   ALT 22  0 - 35 U/L   Alkaline Phosphatase 77  39 - 117 U/L   Total Bilirubin 0.1 (*) 0.3 - 1.2 mg/dL   GFR calc non Af Amer >90  >90 mL/min   GFR calc Af Amer >90  >90  mL/min  LIPASE, BLOOD      Component Value Range   Lipase 21  11 - 59 U/L  URINE MICROSCOPIC-ADD ON      Component Value Range   Squamous Epithelial / LPF FEW (*) RARE   WBC, UA 3-6  <3 WBC/hpf   RBC / HPF 0-2  <3 RBC/hpf    No results found.  4:40 PM Pt feeling better. Abdominal pain and nausea resolved. Tolerating POs. Will d/c home.    1. Nausea & vomiting   2. Epigastric pain       MDM  Pt with nausea, vomiting, epigastric pain after having more alcohol than usual. Headache. Exam unremarkable other than mild epigastric tenderness, with no guarding or rebound. UA, labs all normal. I treated her in ED with IV fluids, zofran, pepcid. Her symptoms all resolved. She is tolerating PO fluids. Her abdomen remains soft, now non tender. i do believe this is most likely alcoholic gastritis. Will d/c home with anti emetics and follow up.   Filed Vitals:   10/03/12 1429 10/03/12 1522 10/03/12 1523 10/03/12 1600  BP: 126/77 105/58  104/52  Pulse: 94  88   Temp: 98.5 F (36.9 C)     TempSrc: Oral     Resp: 18     Height: 5' 1.75" (1.568 m)     Weight: 209 lb (94.802 kg)     SpO2: 99%  100%          Lottie Mussel, PA 10/03/12 1645

## 2012-10-04 NOTE — ED Provider Notes (Signed)
Medical screening examination/treatment/procedure(s) were performed by non-physician practitioner and as supervising physician I was immediately available for consultation/collaboration.    Nelia Shi, MD 10/04/12 2230

## 2012-11-22 ENCOUNTER — Other Ambulatory Visit: Payer: Self-pay | Admitting: Obstetrics and Gynecology

## 2012-11-23 LAB — PAP IG, CT-NG, RFX HPV ASCU: GC Probe Amp: NEGATIVE

## 2012-11-24 LAB — HUMAN PAPILLOMAVIRUS, HIGH RISK: HPV DNA High Risk: NOT DETECTED

## 2013-02-04 ENCOUNTER — Ambulatory Visit: Payer: 59 | Admitting: Family

## 2013-02-07 ENCOUNTER — Ambulatory Visit: Payer: 59 | Admitting: Family

## 2013-02-11 ENCOUNTER — Ambulatory Visit (INDEPENDENT_AMBULATORY_CARE_PROVIDER_SITE_OTHER): Payer: 59 | Admitting: Family

## 2013-02-11 ENCOUNTER — Encounter: Payer: Self-pay | Admitting: Family

## 2013-02-11 VITALS — BP 102/70 | HR 69 | Temp 98.3°F | Resp 16 | Wt 211.0 lb

## 2013-02-11 DIAGNOSIS — D229 Melanocytic nevi, unspecified: Secondary | ICD-10-CM | POA: Insufficient documentation

## 2013-02-11 DIAGNOSIS — K219 Gastro-esophageal reflux disease without esophagitis: Secondary | ICD-10-CM | POA: Insufficient documentation

## 2013-02-11 DIAGNOSIS — IMO0001 Reserved for inherently not codable concepts without codable children: Secondary | ICD-10-CM

## 2013-02-11 DIAGNOSIS — D239 Other benign neoplasm of skin, unspecified: Secondary | ICD-10-CM

## 2013-02-11 DIAGNOSIS — R635 Abnormal weight gain: Secondary | ICD-10-CM

## 2013-02-11 MED ORDER — PANTOPRAZOLE SODIUM 40 MG PO TBEC: 40.0000 mg | DELAYED_RELEASE_TABLET | Freq: Every day | ORAL | Status: DC

## 2013-02-11 NOTE — Assessment & Plan Note (Signed)
Will refer to dermatology

## 2013-02-11 NOTE — Progress Notes (Signed)
  Subjective:    Patient ID: Melissa [name], female    DOB: 02/05/1991, 22 y.o.   MRN: [id number]  HPI  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  Reports that the lesion "hurts."  2) Weight gain- reports 20 pound weight gain in last 7 months.  Would like diabetes testing. She reports poor diet and lack of exercise.    3) GERD- reports that she is taking OTC prilosec 2 tabs daily.  Has gerd symptoms "all day."  Review of Systems    see HPI  Past Medical History  Diagnosis Date  . Asthma     activity induced  . Abnormal Pap smear of cervix 07/11/2011  . Polycystic ovarian syndrome   . Irregular menses   . Acid reflux     History   Social History  . Marital Status: Married    Spouse Name: N/A    Number of Children: 0  . Years of Education: N/A   Occupational History  . Not on file.   Social History Main Topics  . Smoking status: Never Smoker   . Smokeless tobacco: Never Used  . Alcohol Use: No  . Drug Use: No  . Sexually Active: Yes    Birth Control/ Protection: None   Other Topics Concern  . Not on file   Social History Narrative   Caffeine use:  1 can soda daily   Regular exercise:  Just started   2 sisters.     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   Married   No children.    Denies drugs, smoking, or ETOH.          Past Surgical History  Procedure Laterality Date  . No past surgeries      Family History  Problem Relation Age of Onset  . Gallbladder disease Mother   . Hypertension Father   . Other Father     Hepatitis A  . Cancer Maternal Grandmother     stomach cancer  . Emphysema Maternal Grandfather   . Heart disease Paternal Grandfather     Allergies  Allergen Reactions  . Benadryl (Diphenhydramine Hcl) Other (See Comments)    Heart races     Current Outpatient Prescriptions on File Prior to Visit  Medication Sig Dispense Refill  . letrozole (FEMARA) 2.5 MG tablet Take 5 mg by mouth daily.      Marland Kitchen albuterol (PROVENTIL,VENTOLIN) 90 MCG/ACT inhaler Inhale 2 puffs into the lungs every 6 (six) hours as needed for wheezing.  17 g  2  . omeprazole (PRILOSEC) 40 MG capsule Take 1 capsule (40 mg total) by mouth daily.  30 capsule  2   No current facility-administered medications on file prior to visit.    BP 102/70  Pulse 69  Temp(Src) 98.3 F (36.8 C) (Oral)  Resp 16  Wt 211 lb 0.6 oz (95.727 kg)  BMI 38.94 kg/m2  SpO2 99%    Objective:   Physical Exam  Constitutional: She appears well-developed and well-nourished.  Skin:  Approximately 1 cm wide raised, irregular lesion noted on scalp.          Assessment & Plan:

## 2013-02-11 NOTE — Assessment & Plan Note (Signed)
Check TSH, A1C.

## 2013-02-11 NOTE — Assessment & Plan Note (Signed)
Deteriorated. Switch to trial of protonix.

## 2013-02-11 NOTE — Patient Instructions (Signed)
Call if reflux symptoms are not improved in 1 month. You will be contacted about your referral to dermatology. Please let us know if you have not heard back within 1 week about your referral. Complete your lab work prior to leaving.

## 2013-09-01 LAB — HM PAP SMEAR

## 2013-10-02 ENCOUNTER — Emergency Department (HOSPITAL_COMMUNITY)
Admission: EM | Admit: 2013-10-02 | Discharge: 2013-10-02 | Disposition: A | Discharge status: Home / Self Care | Payer: 59 | Attending: Emergency Medicine | Admitting: Emergency Medicine

## 2013-10-02 ENCOUNTER — Encounter (HOSPITAL_COMMUNITY): Payer: Self-pay | Admitting: Emergency Medicine

## 2013-10-02 DIAGNOSIS — R112 Nausea with vomiting, unspecified: Secondary | ICD-10-CM

## 2013-10-02 DIAGNOSIS — R1013 Epigastric pain: Secondary | ICD-10-CM

## 2013-10-02 DIAGNOSIS — Z862 Personal history of diseases of the blood and blood-forming organs and certain disorders involving the immune mechanism: Secondary | ICD-10-CM | POA: Insufficient documentation

## 2013-10-02 DIAGNOSIS — J45909 Unspecified asthma, uncomplicated: Secondary | ICD-10-CM | POA: Insufficient documentation

## 2013-10-02 DIAGNOSIS — K219 Gastro-esophageal reflux disease without esophagitis: Secondary | ICD-10-CM | POA: Insufficient documentation

## 2013-10-02 DIAGNOSIS — J029 Acute pharyngitis, unspecified: Secondary | ICD-10-CM | POA: Insufficient documentation

## 2013-10-02 DIAGNOSIS — J3489 Other specified disorders of nose and nasal sinuses: Secondary | ICD-10-CM | POA: Insufficient documentation

## 2013-10-02 DIAGNOSIS — Z8639 Personal history of other endocrine, nutritional and metabolic disease: Secondary | ICD-10-CM | POA: Insufficient documentation

## 2013-10-02 DIAGNOSIS — Z8742 Personal history of other diseases of the female genital tract: Secondary | ICD-10-CM | POA: Insufficient documentation

## 2013-10-02 DIAGNOSIS — R197 Diarrhea, unspecified: Secondary | ICD-10-CM | POA: Insufficient documentation

## 2013-10-02 DIAGNOSIS — Z79899 Other long term (current) drug therapy: Secondary | ICD-10-CM | POA: Insufficient documentation

## 2013-10-02 DIAGNOSIS — Z3202 Encounter for pregnancy test, result negative: Secondary | ICD-10-CM | POA: Insufficient documentation

## 2013-10-02 LAB — COMPREHENSIVE METABOLIC PANEL
ALT: 23 U/L (ref 0–35)
AST: 21 U/L (ref 0–37)
Albumin: 4.2 g/dL (ref 3.5–5.2)
Alkaline Phosphatase: 84 U/L (ref 39–117)
BILIRUBIN TOTAL: 0.2 mg/dL — AB (ref 0.3–1.2)
BUN: 8 mg/dL (ref 6–23)
CHLORIDE: 100 meq/L (ref 96–112)
CO2: 24 meq/L (ref 19–32)
Calcium: 9.2 mg/dL (ref 8.4–10.5)
Creatinine, Ser: 0.52 mg/dL (ref 0.50–1.10)
GLUCOSE: 87 mg/dL (ref 70–99)
Potassium: 4.2 mEq/L (ref 3.7–5.3)
SODIUM: 141 meq/L (ref 137–147)
Total Protein: 8.4 g/dL — ABNORMAL HIGH (ref 6.0–8.3)

## 2013-10-02 LAB — URINALYSIS, ROUTINE W REFLEX MICROSCOPIC
Bilirubin Urine: NEGATIVE
GLUCOSE, UA: NEGATIVE mg/dL
HGB URINE DIPSTICK: NEGATIVE
Ketones, ur: NEGATIVE mg/dL
LEUKOCYTES UA: NEGATIVE
Nitrite: NEGATIVE
PH: 8.5 — AB (ref 5.0–8.0)
PROTEIN: NEGATIVE mg/dL
SPECIFIC GRAVITY, URINE: 1.016 (ref 1.005–1.030)
Urobilinogen, UA: 0.2 mg/dL (ref 0.0–1.0)

## 2013-10-02 LAB — CBC WITH DIFFERENTIAL/PLATELET
BASOS ABS: 0 10*3/uL (ref 0.0–0.1)
Basophils Relative: 0 % (ref 0–1)
Eosinophils Absolute: 0 10*3/uL (ref 0.0–0.7)
Eosinophils Relative: 1 % (ref 0–5)
HEMATOCRIT: 39.3 % (ref 36.0–46.0)
Hemoglobin: 13.3 g/dL (ref 12.0–15.0)
LYMPHS ABS: 1.8 10*3/uL (ref 0.7–4.0)
LYMPHS PCT: 29 % (ref 12–46)
MCH: 27 pg (ref 26.0–34.0)
MCHC: 33.8 g/dL (ref 30.0–36.0)
MCV: 79.9 fL (ref 78.0–100.0)
MONO ABS: 0.4 10*3/uL (ref 0.1–1.0)
Monocytes Relative: 6 % (ref 3–12)
NEUTROS ABS: 4 10*3/uL (ref 1.7–7.7)
Neutrophils Relative %: 64 % (ref 43–77)
Platelets: 339 10*3/uL (ref 150–400)
RBC: 4.92 MIL/uL (ref 3.87–5.11)
RDW: 15.5 % (ref 11.5–15.5)
WBC: 6.2 10*3/uL (ref 4.0–10.5)

## 2013-10-02 LAB — RAPID STREP SCREEN (MED CTR MEBANE ONLY): Streptococcus, Group A Screen (Direct): NEGATIVE

## 2013-10-02 LAB — LIPASE, BLOOD: Lipase: 24 U/L (ref 11–59)

## 2013-10-02 LAB — POCT PREGNANCY, URINE: Preg Test, Ur: NEGATIVE

## 2013-10-02 MED ORDER — MORPHINE SULFATE 4 MG/ML IJ SOLN
2.0000 mg | Freq: Once | INTRAMUSCULAR | Status: AC
Start: 2013-10-02 — End: 2013-10-02
  Administered 2013-10-02: 2 mg via INTRAVENOUS
  Filled 2013-10-02: qty 1

## 2013-10-02 MED ORDER — ONDANSETRON HCL 4 MG/2ML IJ SOLN
4.0000 mg | Freq: Once | INTRAMUSCULAR | Status: AC
  Administered 2013-10-02: 4 mg via INTRAVENOUS
  Filled 2013-10-02: qty 2

## 2013-10-02 MED ORDER — GI COCKTAIL ~~LOC~~
30.0000 mL | Freq: Once | ORAL | Status: AC
  Administered 2013-10-02: 30 mL via ORAL
  Filled 2013-10-02: qty 30

## 2013-10-02 MED ORDER — SODIUM CHLORIDE 0.9 % IV BOLUS (SEPSIS)
1000.0000 mL | Freq: Once | INTRAVENOUS | Status: AC
  Administered 2013-10-02: 1000 mL via INTRAVENOUS

## 2013-10-02 NOTE — ED Provider Notes (Signed)
CSN: [id number]     Arrival date & time 10/02/13  1111 History   First MD Initiated Contact with Patient 10/02/13 1223     Chief Complaint  Patient presents with  . Emesis    HPI  Melissa [name] is a 23 y.o. female with a PMH of asthma, PCOS, irregular mensis, and acid reflux who presents to the ED for evaluation of emesis.  History was provided by the patient.  Patient states that she had vomiting, nausea, and diarrhea which started this morning.  She had approximately 5 episodes of vomiting today which was described as yellow with no hematemesis.  She also had yellow diarrhea this morning with no hematochezia.  She complains of intermittent epigastric abdominal pain which is worse prior to emesis and relieved after vomiting.  She states she has a hx of reflux and this pain is similar to her reflux in the past but worse. She took anti-nausea medication and her omeprazole with no relief in her symptoms. She denies any dysuria, vaginal bleeding/discharge, or genital sores/odors. She also admits to drinking two alcoholic beverages last night. No previous abdominal surgeries.   She denies any back pain, fevers, chills, cough, chest pain or SOB. She states she is developing a sore throat and rhinorrhea and thinks she may be getting a cold.    Past Medical History  Diagnosis Date  . Asthma     activity induced  . Abnormal Pap smear of cervix 07/11/2011  . Polycystic ovarian syndrome   . Irregular menses   . Acid reflux    Past Surgical History  Procedure Laterality Date  . No past surgeries     Family History  Problem Relation Age of Onset  . Gallbladder disease Mother   . Hypertension Father   . Other Father     Hepatitis A  . Cancer Maternal Grandmother     stomach cancer  . Emphysema Maternal Grandfather   . Heart disease Paternal Grandfather    History  Substance Use Topics  . Smoking status: Never Smoker   . Smokeless tobacco: Never Used  . Alcohol Use: No   OB History   Grav  Para Term Preterm Abortions TAB SAB Ect Mult Living                 Review of Systems  Constitutional: Negative for fever, chills, diaphoresis, activity change, appetite change and fatigue.  HENT: Positive for congestion, rhinorrhea and sore throat. Negative for ear pain.   Respiratory: Negative for cough and shortness of breath.   Cardiovascular: Negative for chest pain and leg swelling.  Gastrointestinal: Positive for nausea, vomiting, abdominal pain and diarrhea. Negative for constipation, blood in stool, anal bleeding and rectal pain.  Genitourinary: Negative for dysuria, hematuria, vaginal bleeding, vaginal discharge, difficulty urinating and vaginal pain.  Musculoskeletal: Negative for back pain and myalgias.  Skin: Negative for color change and wound.  Neurological: Negative for dizziness, syncope, weakness, light-headedness, numbness and headaches.    Allergies  Benadryl  Home Medications   Current Outpatient Rx  Name  Route  Sig  Dispense  Refill  . omeprazole (PRILOSEC) 20 MG capsule   Oral   Take 20 mg by mouth daily.          BP 112/77  Pulse 91  Temp(Src) 97.8 F (36.6 C) (Oral)  Resp 18  SpO2 99%  LMP 03/01/2013  Filed Vitals:   10/02/13 1135 10/02/13 1309 10/02/13 1401 10/02/13 1403  BP: 112/77 111/77 100/62  Pulse: 91 90  91  Temp: 97.8 F (36.6 C)     TempSrc: Oral     Resp: 18     SpO2: 99% 100%  100%    Physical Exam  Nursing note and vitals reviewed. Constitutional: She is oriented to person, place, and time. She appears well-developed and well-nourished. No distress.  HENT:  Head: Normocephalic and atraumatic.  Right Ear: External ear normal.  Left Ear: External ear normal.  Nose: Nose normal.  Mouth/Throat: Oropharynx is clear and moist. No oropharyngeal exudate.  Mild erythema to the posterior pharynx. No exudates or edema. No trismus. Uvula midline. Tympanic membranes gray and translucent bilaterally with no erythema, edema, or  hemotympanum.   Eyes: Conjunctivae are normal. Pupils are equal, round, and reactive to light. Right eye exhibits no discharge. Left eye exhibits no discharge.  Neck: Normal range of motion. Neck supple.  Cardiovascular: Normal rate, regular rhythm and normal heart sounds.  Exam reveals no gallop and no friction rub.   No murmur heard. Pulmonary/Chest: Effort normal and breath sounds normal. No respiratory distress. She has no wheezes. She has no rales. She exhibits no tenderness.  Abdominal: Soft. Bowel sounds are normal. She exhibits no distension and no mass. There is no tenderness. There is no rebound and no guarding.  Musculoskeletal: Normal range of motion. She exhibits no edema and no tenderness.  No CVA or flank tenderness bilaterally.   Neurological: She is alert and oriented to person, place, and time.  Skin: Skin is warm and dry. She is not diaphoretic.   s ED Course  Procedures (including critical care time) Labs Review Labs Reviewed  URINALYSIS, ROUTINE W REFLEX MICROSCOPIC   Imaging Review No results found.  EKG Interpretation   None      Results for orders placed during the hospital encounter of 10/02/13  RAPID STREP SCREEN      Result Value Range   Streptococcus, Group A Screen (Direct) NEGATIVE  NEGATIVE  URINALYSIS, ROUTINE W REFLEX MICROSCOPIC      Result Value Range   Color, Urine YELLOW  YELLOW   APPearance CLEAR  CLEAR   Specific Gravity, Urine 1.016  1.005 - 1.030   pH 8.5 (*) 5.0 - 8.0   Glucose, UA NEGATIVE  NEGATIVE mg/dL   Hgb urine dipstick NEGATIVE  NEGATIVE   Bilirubin Urine NEGATIVE  NEGATIVE   Ketones, ur NEGATIVE  NEGATIVE mg/dL   Protein, ur NEGATIVE  NEGATIVE mg/dL   Urobilinogen, UA 0.2  0.0 - 1.0 mg/dL   Nitrite NEGATIVE  NEGATIVE   Leukocytes, UA NEGATIVE  NEGATIVE  CBC WITH DIFFERENTIAL      Result Value Range   WBC 6.2  4.0 - 10.5 K/uL   RBC 4.92  3.87 - 5.11 MIL/uL   Hemoglobin 13.3  12.0 - 15.0 g/dL   HCT 39.3  36.0 - 46.0 %    MCV 79.9  78.0 - 100.0 fL   MCH 27.0  26.0 - 34.0 pg   MCHC 33.8  30.0 - 36.0 g/dL   RDW 15.5  11.5 - 15.5 %   Platelets 339  150 - 400 K/uL   Neutrophils Relative % 64  43 - 77 %   Neutro Abs 4.0  1.7 - 7.7 K/uL   Lymphocytes Relative 29  12 - 46 %   Lymphs Abs 1.8  0.7 - 4.0 K/uL   Monocytes Relative 6  3 - 12 %   Monocytes Absolute 0.4  0.1 - 1.0 K/uL  Eosinophils Relative 1  0 - 5 %   Eosinophils Absolute 0.0  0.0 - 0.7 K/uL   Basophils Relative 0  0 - 1 %   Basophils Absolute 0.0  0.0 - 0.1 K/uL  COMPREHENSIVE METABOLIC PANEL      Result Value Range   Sodium 141  137 - 147 mEq/L   Potassium 4.2  3.7 - 5.3 mEq/L   Chloride 100  96 - 112 mEq/L   CO2 24  19 - 32 mEq/L   Glucose, Bld 87  70 - 99 mg/dL   BUN 8  6 - 23 mg/dL   Creatinine, Ser 0.52  0.50 - 1.10 mg/dL   Calcium 9.2  8.4 - 10.5 mg/dL   Total Protein 8.4 (*) 6.0 - 8.3 g/dL   Albumin 4.2  3.5 - 5.2 g/dL   AST 21  0 - 37 U/L   ALT 23  0 - 35 U/L   Alkaline Phosphatase 84  39 - 117 U/L   Total Bilirubin 0.2 (*) 0.3 - 1.2 mg/dL   GFR calc non Af Amer >90  >90 mL/min   GFR calc Af Amer >90  >90 mL/min  LIPASE, BLOOD      Result Value Range   Lipase 24  11 - 59 U/L  POCT PREGNANCY, URINE      Result Value Range   Preg Test, Ur NEGATIVE  NEGATIVE    MDM   Fallan Mccarey is a 23 y.o. female  with a PMH of asthma, PCOS, irregular mensis, and acid reflux who presents to the ED for evaluation of emesis.    Rechecks  1:30 PM = Pain still present after 2 mg morphine. GI cocktail ordered.   2:15 PM = Patient dressed. States abdominal pain resolved after GI cocktail.    Etiology of abdominal pain possibly due to gastroenteritis (vomiting and diarrhea) vs reflux/GERD. Patient had complete relief in her symptoms with GI cocktail. Abdominal exam benign. Labs unremarkable. Patient has hx of GERD with similar symptoms today. Patient afebrile and non-toxic in appearance. Patient instructed to continue to take omeprazole  and follow-up with her PCP. Patient also complained of sore throat, rhinorrhea and congestion. Rapid strep negative. Possibly developing a URI. Return precautions, discharge instructions, and follow-up was discussed with the patient before discharge. Patient's significant other in the ED to drive patient home.   Discharge Medication List as of 10/02/2013  2:27 PM      Final impressions: 1. Epigastric pain   2. Nausea vomiting and diarrhea      Mercy Moore PA-C   This patient was discussed with Dr. Shon Hale, PA-C 10/02/13 2056

## 2013-10-02 NOTE — ED Notes (Addendum)
To ED for eval of vomiting and nausea that started this am. Last meal was yesterday, cake. LMP July, pt states she is irregular. Pt states she drank 2 tequila drinks last night also

## 2013-10-02 NOTE — Discharge Instructions (Signed)
Drink plenty of fluids and rest - follow a clear liquid diet for 24 hours  Continue to take your omeprazole as directed  Return to the emergency department if you develop any changing/worsening condition, repeated vomiting, fever, back pain, blood in your stool/vomit/urine, feeling like you are going to pass out, or any other concerns (please read additional information regarding your condition below)   Abdominal Pain, Adult Many things can cause abdominal pain. Usually, abdominal pain is not caused by a disease and will improve without treatment. It can often be observed and treated at home. Your health care provider will do a physical exam and possibly order blood tests and X-rays to help determine the seriousness of your pain. However, in many cases, more time must pass before a clear cause of the pain can be found. Before that point, your health care provider may not know if you need more testing or further treatment. HOME CARE INSTRUCTIONS  Monitor your abdominal pain for any changes. The following actions may help to alleviate any discomfort you are experiencing:  Only take over-the-counter or prescription medicines as directed by your health care provider.  Do not take laxatives unless directed to do so by your health care provider.  Try a clear liquid diet (broth, tea, or water) as directed by your health care provider. Slowly move to a bland diet as tolerated. SEEK MEDICAL CARE IF:  You have unexplained abdominal pain.  You have abdominal pain associated with nausea or diarrhea.  You have pain when you urinate or have a bowel movement.  You experience abdominal pain that wakes you in the night.  You have abdominal pain that is worsened or improved by eating food.  You have abdominal pain that is worsened with eating fatty foods. SEEK IMMEDIATE MEDICAL CARE IF:   Your pain does not go away within 2 hours.  You have a fever.  You keep throwing up (vomiting).  Your pain is  felt only in portions of the abdomen, such as the right side or the left lower portion of the abdomen.  You pass bloody or black tarry stools. MAKE SURE YOU:  Understand these instructions.   Will watch your condition.   Will get help right away if you are not doing well or get worse.  Document Released: 05/28/2005 Document Revised: 06/08/2013 Document Reviewed: 04/27/2013 [facility] Patient Information 2014 [place].  Clear liquid diet    The clear liquid diet consists of foods that are liquid or will become liquid at room temperature. Examples of foods allowed on a clear liquid diet include fruit juice, broth or bouillon, gelatin, or frozen ice pops. You should be able to see through the liquid. The purpose of this diet is to provide the necessary fluids, electrolytes (such as sodium and potassium), and energy to keep the body functioning during times when you are not able to consume a regular diet. A clear liquid diet should not be continued for long periods of time, as it is not nutritionally adequate.  A CLEAR LIQUID DIET MAY BE NEEDED:  When a sudden-onset (acute) condition occurs before or after surgery.   As the first step in oral feeding.   For fluid and electrolyte replacement in diarrheal diseases.   As a diet before certain medical tests are performed.  ADEQUACY The clear liquid diet is adequate only in ascorbic acid, according to the Recommended Dietary Allowances of the [employer].  CHOOSING FOODS Breads and Starches  Allowed: None are allowed.  Avoid: All are to be avoided.  Vegetables  Allowed: Strained vegetable juices.   Avoid: Any others.  Fruit  Allowed: Strained fruit juices and fruit drinks. Include 1 serving of citrus or vitamin C-enriched fruit juice daily.   Avoid: Any others.  Meat and Meat Substitutes  Allowed: None are allowed.   Avoid: All are to be avoided.  Milk Products  Allowed:  None are allowed.   Avoid: All are to be avoided.  Soups and Combination Foods  Allowed: Clear bouillon, broth, or strained broth-based soups.   Avoid: Any others.  Desserts and Sweets  Allowed: Sugar, honey. High-protein gelatin. Flavored gelatin, ices, or frozen ice pops that do not contain milk.   Avoid: Any others.  Fats and Oils  Allowed: None are allowed.   Avoid: All are to be avoided.  Beverages  Allowed: Cereal beverages, coffee (regular or decaffeinated), tea, or soda at the discretion of your health care provider.   Avoid: Any others.  Condiments  Allowed: Salt.   Avoid: Any others, including pepper.  Supplements  Allowed: Liquid nutrition beverages that you can see through.   Avoid: Any others that contain lactose or fiber. SAMPLE MEAL PLAN Breakfast  4 oz (120 mL) strained orange juice.   to 1 cup (120 to 240 mL) gelatin (plain or fortified).  1 cup (240 mL) beverage (coffee or tea).  Sugar, if desired. Midmorning Snack   cup (120 mL) gelatin (plain or fortified). Lunch  1 cup (240 mL) broth or consomm.  4 oz (120 mL) strained grapefruit juice.   cup (120 mL) gelatin (plain or fortified).  1 cup (240 mL) beverage (coffee or tea).  Sugar, if desired. Midafternoon Snack   cup (120 mL) fruit ice.   cup (120 mL) strained fruit juice. Dinner  1 cup (240 mL) broth or consomm.   cup (120 mL) cranberry juice.   cup (120 mL) flavored gelatin (plain or fortified).  1 cup (240 mL) beverage (coffee or tea).  Sugar, if desired. Evening Snack  4 oz (120 mL) strained apple juice (vitamin C-fortified).   cup (120 mL) flavored gelatin (plain or fortified). MAKE SURE YOU:  Understand these instructions.  Will watch your child's condition.  Will get help right away if your child is not doing well or gets worse. Document Released: 08/18/2005 Document Revised: 04/20/2013 Document Reviewed:  01/18/2013 [facility] Patient Information 2014 [place].

## 2013-10-03 NOTE — ED Provider Notes (Signed)
Medical screening examination/treatment/procedure(s) were conducted as a shared visit with non-physician practitioner(s) or resident and myself. I personally evaluated the patient during the encounter and agree with the findings and plan unless otherwise indicated.  I have personally reviewed any xrays and/ or EKG's with the provider and I agree with interpretation.  Vomiting, nausea and abd pain worse after eating. Pt did have alcohol last night but has had intermittent post meal pain. No known ulcers or biliary hx. Exam mild epig pain, soft, no guarding, mmm, well appearing. Clinically gastritis vs gerd vs biliary. Labs okay. Bedside US gb okay. PPI and fup outpt discussed.  EMERGENCY DEPARTMENT BILIARY ULTRASOUND INTERPRETATION  "Study: Limited Abdominal Ultrasound of the gallbladder and common bile duct."  INDICATIONS: Abdominal pain, Nausea and Vomiting  Indication: Multiple views of the gallbladder and common bile duct were obtained in real-time with a Multi-frequency probe."  PERFORMED BY: Myself  IMAGES ARCHIVED?: Yes  FINDINGS: Gallstones absent, Gallbladder wall normal in thickness, Sonographic Dakin's sign absent and Common bile duct normal in size  LIMITATIONS: Body Habitus, Bowel Gas and Abdominal pain  INTERPRETATION: Normal  Epig pain, Vomiting   Mariea Clonts, MD 10/03/13 1956

## 2013-10-04 LAB — CULTURE, GROUP A STREP

## 2013-11-15 ENCOUNTER — Encounter (HOSPITAL_COMMUNITY): Payer: Self-pay | Admitting: Emergency Medicine

## 2013-11-15 ENCOUNTER — Encounter: Payer: Self-pay | Admitting: Physician Assistant

## 2013-11-15 ENCOUNTER — Emergency Department (HOSPITAL_COMMUNITY)
Admission: EM | Admit: 2013-11-15 | Discharge: 2013-11-15 | Disposition: A | Discharge status: Home / Self Care | Payer: 59 | Attending: Emergency Medicine | Admitting: Emergency Medicine

## 2013-11-15 ENCOUNTER — Ambulatory Visit (INDEPENDENT_AMBULATORY_CARE_PROVIDER_SITE_OTHER): Payer: 59 | Admitting: Physician Assistant

## 2013-11-15 ENCOUNTER — Emergency Department (HOSPITAL_COMMUNITY): Payer: 59

## 2013-11-15 VITALS — BP 124/82 | HR 112 | Temp 101.4°F

## 2013-11-15 DIAGNOSIS — J45909 Unspecified asthma, uncomplicated: Secondary | ICD-10-CM | POA: Insufficient documentation

## 2013-11-15 DIAGNOSIS — Z8742 Personal history of other diseases of the female genital tract: Secondary | ICD-10-CM | POA: Insufficient documentation

## 2013-11-15 DIAGNOSIS — R109 Unspecified abdominal pain: Secondary | ICD-10-CM | POA: Insufficient documentation

## 2013-11-15 DIAGNOSIS — K219 Gastro-esophageal reflux disease without esophagitis: Secondary | ICD-10-CM | POA: Insufficient documentation

## 2013-11-15 DIAGNOSIS — Z79899 Other long term (current) drug therapy: Secondary | ICD-10-CM | POA: Insufficient documentation

## 2013-11-15 DIAGNOSIS — Z3202 Encounter for pregnancy test, result negative: Secondary | ICD-10-CM | POA: Insufficient documentation

## 2013-11-15 DIAGNOSIS — K810 Acute cholecystitis: Secondary | ICD-10-CM

## 2013-11-15 DIAGNOSIS — E669 Obesity, unspecified: Secondary | ICD-10-CM | POA: Insufficient documentation

## 2013-11-15 DIAGNOSIS — R000 Tachycardia, unspecified: Secondary | ICD-10-CM | POA: Insufficient documentation

## 2013-11-15 DIAGNOSIS — R1110 Vomiting, unspecified: Secondary | ICD-10-CM | POA: Insufficient documentation

## 2013-11-15 LAB — PREGNANCY, URINE: PREG TEST UR: NEGATIVE

## 2013-11-15 LAB — URINALYSIS, ROUTINE W REFLEX MICROSCOPIC
Glucose, UA: NEGATIVE mg/dL
KETONES UR: 40 mg/dL — AB
Leukocytes, UA: NEGATIVE
NITRITE: NEGATIVE
PH: 6 (ref 5.0–8.0)
Protein, ur: NEGATIVE mg/dL
Specific Gravity, Urine: 1.026 (ref 1.005–1.030)
Urobilinogen, UA: 0.2 mg/dL (ref 0.0–1.0)

## 2013-11-15 LAB — COMPREHENSIVE METABOLIC PANEL
ALK PHOS: 82 U/L (ref 39–117)
ALT: 26 U/L (ref 0–35)
AST: 20 U/L (ref 0–37)
Albumin: 4.3 g/dL (ref 3.5–5.2)
BUN: 11 mg/dL (ref 6–23)
CHLORIDE: 97 meq/L (ref 96–112)
CO2: 24 mEq/L (ref 19–32)
CREATININE: 0.65 mg/dL (ref 0.50–1.10)
Calcium: 9.3 mg/dL (ref 8.4–10.5)
GFR calc Af Amer: >90 mL/min (ref >90)
Glucose, Bld: 86 mg/dL (ref 70–99)
POTASSIUM: 3.4 meq/L — AB (ref 3.7–5.3)
Sodium: 138 mEq/L (ref 137–147)
Total Bilirubin: 0.4 mg/dL (ref 0.3–1.2)
Total Protein: 8.4 g/dL — ABNORMAL HIGH (ref 6.0–8.3)

## 2013-11-15 LAB — CBC WITH DIFFERENTIAL/PLATELET
Basophils Absolute: 0 10*3/uL (ref 0.0–0.1)
Basophils Relative: 0 % (ref 0–1)
Eosinophils Absolute: 0 10*3/uL (ref 0.0–0.7)
Eosinophils Relative: 0 % (ref 0–5)
HEMATOCRIT: 41.2 % (ref 36.0–46.0)
HEMOGLOBIN: 13.8 g/dL (ref 12.0–15.0)
LYMPHS PCT: 14 % (ref 12–46)
Lymphs Abs: 1 10*3/uL (ref 0.7–4.0)
MCH: 26.7 pg (ref 26.0–34.0)
MCHC: 33.5 g/dL (ref 30.0–36.0)
MCV: 79.7 fL (ref 78.0–100.0)
MONO ABS: 0.6 10*3/uL (ref 0.1–1.0)
MONOS PCT: 8 % (ref 3–12)
NEUTROS ABS: 5.8 10*3/uL (ref 1.7–7.7)
Neutrophils Relative %: 78 % — ABNORMAL HIGH (ref 43–77)
Platelets: 349 10*3/uL (ref 150–400)
RBC: 5.17 MIL/uL — ABNORMAL HIGH (ref 3.87–5.11)
RDW: 14.7 % (ref 11.5–15.5)
WBC: 7.4 10*3/uL (ref 4.0–10.5)

## 2013-11-15 LAB — URINE MICROSCOPIC-ADD ON

## 2013-11-15 LAB — LIPASE, BLOOD: Lipase: 23 U/L (ref 11–59)

## 2013-11-15 MED ORDER — ONDANSETRON HCL 4 MG/2ML IJ SOLN: 4.0000 mg | Freq: Once | INTRAMUSCULAR | Status: DC

## 2013-11-15 MED ORDER — PROMETHAZINE HCL 25 MG/ML IJ SOLN
12.5000 mg | Freq: Once | INTRAMUSCULAR | Status: AC
  Administered 2013-11-15: 12.5 mg via INTRAVENOUS
  Filled 2013-11-15: qty 1

## 2013-11-15 MED ORDER — MORPHINE SULFATE 4 MG/ML IJ SOLN: 6.0000 mg | Freq: Once | INTRAMUSCULAR | Status: DC

## 2013-11-15 MED ORDER — ONDANSETRON HCL 4 MG/2ML IJ SOLN
4.0000 mg | Freq: Once | INTRAMUSCULAR | Status: AC
  Administered 2013-11-15: 4 mg via INTRAVENOUS
  Filled 2013-11-15: qty 2

## 2013-11-15 MED ORDER — POTASSIUM CHLORIDE 10 MEQ/100ML IV SOLN
10.0000 meq | Freq: Once | INTRAVENOUS | Status: AC
  Administered 2013-11-15: 10 meq via INTRAVENOUS
  Filled 2013-11-15: qty 100

## 2013-11-15 MED ORDER — FENTANYL CITRATE 0.05 MG/ML IJ SOLN
100.0000 ug | Freq: Once | INTRAMUSCULAR | Status: AC
  Administered 2013-11-15: 100 ug via INTRAVENOUS
  Filled 2013-11-15: qty 2

## 2013-11-15 MED ORDER — ONDANSETRON HCL 4 MG PO TABS: 4.0000 mg | ORAL_TABLET | Freq: Four times a day (QID) | ORAL | Status: DC

## 2013-11-15 MED ORDER — GI COCKTAIL ~~LOC~~
30.0000 mL | Freq: Once | ORAL | Status: AC
  Administered 2013-11-15: 30 mL via ORAL
  Filled 2013-11-15: qty 30

## 2013-11-15 MED ORDER — SODIUM CHLORIDE 0.9 % IV BOLUS (SEPSIS)
2000.0000 mL | Freq: Once | INTRAVENOUS | Status: AC
  Administered 2013-11-15: 2000 mL via INTRAVENOUS

## 2013-11-15 NOTE — Discharge Instructions (Signed)
Return here as needed. Follow up with your doctor. °

## 2013-11-15 NOTE — ED Provider Notes (Signed)
CSN: [id number]     Arrival date & time 11/15/13  1545 History   First MD Initiated Contact with Patient 11/15/13 1808     Chief Complaint  Patient presents with  . Abdominal Pain  . Emesis     (Consider location/radiation/quality/duration/timing/severity/associated sxs/prior Treatment) HPI: [name] is a 23 year old woman with past medical history of GERD who presents to the Emergency Department today with vomiting and abdominal pain.  She reports that the pain and vomiting began this morning.  The pain begins in her middle upper abdomen and shoots around her right upper quadrant to her right side and back.  She describes it as a 7/10 "stabbing" pain that is relieved after she vomits for about 20 minutes and then starts to come back.  She has vomited over five times and is now dry heaving.  She has not had anything to eat or drink today.  She had one watery bowel movement this morning.  She also describes strong smelling urine for a few weeks, for which she has a doctors appointment in a few days.  She denies dysuria.  She went to her PCP for evaluation of her vomiting and abdominal pain earlier today and was found to have a temperature of 101 F.    Past Medical History  Diagnosis Date  . Asthma     activity induced  . Abnormal Pap smear of cervix 07/11/2011  . Polycystic ovarian syndrome   . Irregular menses   . Acid reflux    Past Surgical History  Procedure Laterality Date  . No past surgeries     Family History  Problem Relation Age of Onset  . Gallbladder disease Mother   . Hypertension Father   . Other Father     Hepatitis A  . Cancer Maternal Grandmother     stomach cancer  . Emphysema Maternal Grandfather   . Heart disease Paternal Grandfather    History  Substance Use Topics  . Smoking status: Never Smoker   . Smokeless tobacco: Never Used  . Alcohol Use: No   OB History   Grav Para Term Preterm Abortions TAB SAB Ect Mult Living                 Review of  Systems  All other systems negative except as documented in the HPI. All pertinent positives and negatives as reviewed in the HPI.  Allergies  Benadryl  Home Medications   Current Outpatient Rx  Name  Route  Sig  Dispense  Refill  . omeprazole (PRILOSEC) 20 MG capsule   Oral   Take 20 mg by mouth daily.          BP 110/81  Pulse 86  Temp(Src) 100 F (37.8 C) (Oral)  Resp 22  Ht 5\' 2"  (1.575 m)  Wt 215 lb (97.523 kg)  BMI 39.31 kg/m2  SpO2 98% Physical Exam  Nursing note and vitals reviewed. Constitutional: She is oriented to person, place, and time. She appears well-developed and well-nourished. No distress.  Patient is obese with significant central adiposity  HENT:  Head: Normocephalic and atraumatic.  Mouth/Throat: Oropharynx is clear and moist.  Eyes: Pupils are equal, round, and reactive to light.  Neck: Normal range of motion. Neck supple.  Cardiovascular: Normal rate, regular rhythm, normal heart sounds and intact distal pulses.  Exam reveals no gallop and no friction rub.   No murmur heard. Tachycardic, regular rhythm  Pulmonary/Chest: Effort normal and breath sounds normal. No respiratory  distress.  Abdominal: Soft. She exhibits no distension. There is tenderness. There is no rebound and no guarding.  Bowel sounds distant; tenderness to light and deep palpation of right upper quadrant.  No rebound tenderness.  Negative Murphys.  Neurological: She is alert and oriented to person, place, and time.  Skin: Skin is warm and dry. No rash noted. No erythema.    ED Course  Procedures (including critical care time) Labs Review Labs Reviewed  CBC WITH DIFFERENTIAL - Abnormal; Notable for the following:    RBC 5.17 (*)    Neutrophils Relative % 78 (*)    All other components within normal limits  COMPREHENSIVE METABOLIC PANEL - Abnormal; Notable for the following:    Potassium 3.4 (*)    Total Protein 8.4 (*)    All other components within normal limits   URINALYSIS, ROUTINE W REFLEX MICROSCOPIC - Abnormal; Notable for the following:    APPearance CLOUDY (*)    Hgb urine dipstick SMALL (*)    Bilirubin Urine SMALL (*)    Ketones, ur 40 (*)    All other components within normal limits  URINE MICROSCOPIC-ADD ON - Abnormal; Notable for the following:    Squamous Epithelial / LPF MANY (*)    Bacteria, UA MANY (*)    All other components within normal limits  LIPASE, BLOOD  PREGNANCY, URINE   Patient be treated for gastroenteritis.  Told to return here as needed.  Patient has a GI Dr.  Followup.  Told to increase her fluid intake.  She is advised to rest as much possible   Brent General, PA-C 11/16/13 0139

## 2013-11-15 NOTE — Progress Notes (Signed)
   Subjective:    Patient ID: Melissa [name], female    DOB: [birth year], 23 y.o.   MRN: [id number]  HPI Comments: Reports epigastric and RUQ pain, feels needs to burp and then vomits. Vomiting occurs approximately every 1.5 to 2.5 hours. Vomit is without blood. History significant for GERD.  Abdominal Pain This is a new problem. The current episode started today. The onset quality is sudden. The problem occurs constantly. The problem has been gradually worsening. The pain is located in the epigastric region and RUQ. The pain is at a severity of 8/10. The quality of the pain is aching and cramping. The abdominal pain does not radiate. Associated symptoms include anorexia, belching, a fever, nausea and vomiting. Pertinent negatives include no arthralgias, constipation, diarrhea, dysuria, flatus, headaches, hematochezia or hematuria. Exacerbated by: no aggravating or alleviating factors. The pain is relieved by nothing. She has tried nothing for the symptoms.  Emesis  Associated symptoms include abdominal pain (does not radiate) and a fever. Pertinent negatives include no arthralgias, chills, diarrhea, dizziness or headaches.      Review of Systems  Constitutional: Positive for fever and appetite change. Negative for chills.  Gastrointestinal: Positive for nausea, vomiting, abdominal pain (does not radiate) and anorexia. Negative for diarrhea, constipation, hematochezia and flatus.  Genitourinary: Negative for dysuria and hematuria.  Musculoskeletal: Negative for arthralgias.  Skin: Negative for rash.  Neurological: Negative for dizziness and headaches.   Past Medical History  Diagnosis Date  . Asthma     activity induced  . Abnormal Pap smear of cervix 07/11/2011  . Polycystic ovarian syndrome   . Irregular menses   . Acid reflux        Objective:   Physical Exam  Nursing note and vitals reviewed. Constitutional: She is oriented to person, place, and time. She appears well-developed  and well-nourished. She appears distressed (writhing in pain when I enter the room).  HENT:  Head: Normocephalic and atraumatic.  Eyes: Conjunctivae are normal. No scleral icterus.  Cardiovascular: Regular rhythm.  Exam reveals no gallop and no friction rub.   No murmur heard. Patient is tachycardic  Abdominal: Soft. Normal appearance. There is tenderness in the right upper quadrant and epigastric area. There is positive Tondreau's sign. There is no rigidity, no rebound, no guarding, no CVA tenderness and no tenderness at McBurney's point.  Musculoskeletal: Normal range of motion.  Neurological: She is oriented to person, place, and time.  Skin: Skin is warm and dry.  Psychiatric: She has a normal mood and affect.   Lab Results  Component Value Date   WBC 6.2 10/02/2013   HGB 13.3 10/02/2013   HCT 39.3 10/02/2013   PLT 339 10/02/2013   GLUCOSE 87 10/02/2013   ALT 23 10/02/2013   AST 21 10/02/2013   NA 141 10/02/2013   K 4.2 10/02/2013   CL 100 10/02/2013   CREATININE 0.52 10/02/2013   BUN 8 10/02/2013   CO2 24 10/02/2013   TSH 1.973 07/11/2011       Assessment & Plan:    Abdominal pain Possible cholecystitis Patient febrile, tachy with positive Dibari's sign Counseled patient to report to [facility] Emergency Department for further evaluation.

## 2013-11-15 NOTE — Progress Notes (Signed)
Pre visit review using our clinic review tool, if applicable. No additional management support is needed unless otherwise documented below in the visit note. 

## 2013-11-15 NOTE — ED Notes (Signed)
Pt c/o mid upper abdominal pain which started today. Pt also c/o vomiting since this morning. Pt states now she has dry heaves. Pt states pain is constant. Pt went to [facility] earlier today and they sent her to the ED. Pt alert, no acute distress. Skin warm dry. Pt reports last BM was this morning.

## 2013-11-15 NOTE — ED Notes (Signed)
Labs attempted to be drawn by phlebotomist, however unsuccessful.

## 2013-11-15 NOTE — Patient Instructions (Signed)
It was great to meet you today [name]!   I would like you to go to [facility] for further evaluation of your abdominal pain.  Cholecystitis  Cholecystitis is swelling and irritation (inflammation) of your gallbladder. This often happens when gallstones or sludge build up in the gallbladder. Treatment is needed right away. [place] care depends on how you were treated. In general:  If you were given antibiotic medicine, take it as told. Finish the medicine even if you start to feel better.  Only take medicines as told by your doctor.  Eat low-fat foods until your next doctor visit.  Keep all doctor visits as told. GET HELP RIGHT AWAY IF:  You have more pain and medicine does not help.  Your pain moves to a different part of your belly (abdomen) or to your back.  You have a fever.  You feel sick to your stomach (nauseous).  You throw up (vomit). MAKE SURE YOU:  Understand these instructions.  Will watch your condition.  Will get help right away if you are not doing well or get worse. Document Released: 08/07/2011 Document Revised: 11/10/2011 Document Reviewed: 08/07/2011 [facility] Patient Information 2014 [place], [place].

## 2013-11-16 ENCOUNTER — Encounter: Payer: Self-pay | Admitting: Gastroenterology

## 2013-11-16 NOTE — ED Provider Notes (Signed)
Medical screening examination/treatment/procedure(s) were performed by non-physician practitioner and as supervising physician I was immediately available for consultation/collaboration.   EKG Interpretation None       Martha K Linker, MD 11/16/13 1500 

## 2013-11-17 ENCOUNTER — Ambulatory Visit: Payer: 59 | Admitting: Physician Assistant

## 2013-11-18 ENCOUNTER — Encounter: Payer: Self-pay | Admitting: Family Medicine

## 2013-11-18 ENCOUNTER — Ambulatory Visit (INDEPENDENT_AMBULATORY_CARE_PROVIDER_SITE_OTHER): Payer: 59 | Admitting: Family Medicine

## 2013-11-18 VITALS — BP 100/78 | HR 83 | Temp 98.4°F | Ht 62.0 in

## 2013-11-18 DIAGNOSIS — R82998 Other abnormal findings in urine: Secondary | ICD-10-CM

## 2013-11-18 DIAGNOSIS — J329 Chronic sinusitis, unspecified: Secondary | ICD-10-CM

## 2013-11-18 DIAGNOSIS — R1013 Epigastric pain: Secondary | ICD-10-CM

## 2013-11-18 DIAGNOSIS — K219 Gastro-esophageal reflux disease without esophagitis: Secondary | ICD-10-CM

## 2013-11-18 DIAGNOSIS — R8271 Bacteriuria: Secondary | ICD-10-CM

## 2013-11-18 DIAGNOSIS — R112 Nausea with vomiting, unspecified: Secondary | ICD-10-CM

## 2013-11-18 LAB — CBC
HCT: 40.3 % (ref 36.0–46.0)
Hemoglobin: 13.1 g/dL (ref 12.0–15.0)
MCH: 25.9 pg — ABNORMAL LOW (ref 26.0–34.0)
MCHC: 32.5 g/dL (ref 30.0–36.0)
MCV: 79.8 fL (ref 78.0–100.0)
PLATELETS: 353 10*3/uL (ref 150–400)
RBC: 5.05 MIL/uL (ref 3.87–5.11)
RDW: 15.7 % — AB (ref 11.5–15.5)
WBC: 5.8 10*3/uL (ref 4.0–10.5)

## 2013-11-18 MED ORDER — CLARITHROMYCIN 500 MG PO TABS: 500.0000 mg | ORAL_TABLET | Freq: Two times a day (BID) | ORAL | Status: DC

## 2013-11-18 MED ORDER — RANITIDINE HCL 300 MG PO TABS: 300.0000 mg | ORAL_TABLET | Freq: Every day | ORAL | Status: DC

## 2013-11-18 MED ORDER — PROMETHAZINE HCL 25 MG PO TABS: 25.0000 mg | ORAL_TABLET | Freq: Four times a day (QID) | ORAL | Status: DC | PRN

## 2013-11-18 NOTE — Progress Notes (Signed)
Pre visit review using our clinic review tool, if applicable. No additional management support is needed unless otherwise documented below in the visit note. 

## 2013-11-18 NOTE — Patient Instructions (Signed)
Bananas, rice, applesauce toast Clear fluids, ginger ale and gatorade frequent sips  Diet for Diarrhea, Adult Frequent, runny stools (diarrhea) may be caused or worsened by food or drink. Diarrhea may be relieved by changing your diet. Since diarrhea can last up to 7 days, it is easy for you to lose too much fluid from the body and become dehydrated. Fluids that are lost need to be replaced. Along with a modified diet, make sure you drink enough fluids to keep your urine clear or pale yellow. DIET INSTRUCTIONS  Ensure adequate fluid intake (hydration): have 1 cup (8 oz) of fluid for each diarrhea episode. Avoid fluids that contain simple sugars or sports drinks, fruit juices, whole milk products, and sodas. Your urine should be clear or pale yellow if you are drinking enough fluids. Hydrate with an oral rehydration solution that you can purchase at pharmacies, retail stores, and online. You can prepare an oral rehydration solution at home by mixing the following ingredients together:    tsp table salt.   tsp baking soda.   tsp salt substitute containing potassium chloride.  1  tablespoons sugar.  1 L (34 oz) of water.  Certain foods and beverages may increase the speed at which food moves through the gastrointestinal (GI) tract. These foods and beverages should be avoided and include:  Caffeinated and alcoholic beverages.  High-fiber foods, such as raw fruits and vegetables, nuts, seeds, and whole grain breads and cereals.  Foods and beverages sweetened with sugar alcohols, such as xylitol, sorbitol, and mannitol.  Some foods may be well tolerated and may help thicken stool including:  Starchy foods, such as rice, toast, pasta, low-sugar cereal, oatmeal, grits, baked potatoes, crackers, and bagels.   Bananas.   Applesauce.  Add probiotic-rich foods to help increase healthy bacteria in the GI tract, such as yogurt and fermented milk products. RECOMMENDED FOODS AND  BEVERAGES Starches Choose foods with less than 2 g of fiber per serving.  Recommended:  White, [place], and pita breads, plain rolls, buns, bagels. Plain muffins, matzo. Soda, saltine, or graham crackers. Pretzels, melba toast, zwieback. Cooked cereals made with water: cornmeal, farina, cream cereals. Dry cereals: refined corn, wheat, rice. Potatoes prepared any way without skins, refined macaroni, spaghetti, noodles, refined rice.  Avoid:  Bread, rolls, or crackers made with whole wheat, multi-grains, rye, bran seeds, nuts, or coconut. Corn tortillas or taco shells. Cereals containing whole grains, multi-grains, bran, coconut, nuts, raisins. Cooked or dry oatmeal. Coarse wheat cereals, granola. Cereals advertised as "high-fiber." Potato skins. Whole grain pasta, wild or brown rice. Popcorn. Sweet potatoes, yams. Sweet rolls, doughnuts, waffles, pancakes, sweet breads. Vegetables  Recommended: Strained tomato and vegetable juices. Most well-cooked and canned vegetables without seeds. Fresh: Tender lettuce, cucumber without the skin, cabbage, spinach, bean sprouts.  Avoid: Fresh, cooked, or canned: Artichokes, baked beans, beet greens, broccoli, Brussels sprouts, corn, kale, legumes, peas, sweet potatoes. Cooked: Green or red cabbage, spinach. Avoid large servings of any vegetables because vegetables shrink when cooked, and they contain more fiber per serving than fresh vegetables. Fruit  Recommended: Cooked or canned: Apricots, applesauce, cantaloupe, cherries, fruit cocktail, grapefruit, grapes, kiwi, mandarin oranges, peaches, pears, plums, watermelon. Fresh: Apples without skin, ripe banana, grapes, cantaloupe, cherries, grapefruit, peaches, oranges, plums. Keep servings limited to  cup or 1 piece.  Avoid: Fresh: Apples with skin, apricots, mangoes, pears, raspberries, strawberries. Prune juice, stewed or dried prunes. Dried fruits, raisins, dates. Large servings of all fresh  fruits. Protein  Recommended: Ground or  well-cooked tender beef, ham, veal, lamb, pork, or poultry. Eggs. Fish, oysters, shrimp, lobster, other seafoods. Liver, organ meats.  Avoid: Tough, fibrous meats with gristle. Peanut butter, smooth or chunky. Cheese, nuts, seeds, legumes, dried peas, beans, lentils. Dairy  Recommended: Yogurt, lactose-free milk, kefir, drinkable yogurt, buttermilk, soy milk, or plain hard cheese.  Avoid: Milk, chocolate milk, beverages made with milk, such as milkshakes. Soups  Recommended: Bouillon, broth, or soups made from allowed foods. Any strained soup.  Avoid: Soups made from vegetables that are not allowed, cream or milk-based soups. Desserts and Sweets  Recommended: Sugar-free gelatin, sugar-free frozen ice pops made without sugar alcohol.  Avoid: Plain cakes and cookies, pie made with fruit, pudding, custard, cream pie. Gelatin, fruit, ice, sherbet, frozen ice pops. Ice cream, ice milk without nuts. Plain hard candy, honey, jelly, molasses, syrup, sugar, chocolate syrup, gumdrops, marshmallows. Fats and Oils  Recommended: Limit fats to less than 8 tsp per day.  Avoid: Seeds, nuts, olives, avocados. Margarine, butter, cream, mayonnaise, salad oils, plain salad dressings. Plain gravy, crisp bacon without rind. Beverages  Recommended: Water, decaffeinated teas, oral rehydration solutions, sugar-free beverages not sweetened with sugar alcohols.  Avoid: Fruit juices, caffeinated beverages (coffee, tea, soda), alcohol, sports drinks, or lemon-lime soda. Condiments  Recommended: Ketchup, mustard, horseradish, vinegar, cocoa powder. Spices in moderation: allspice, basil, bay leaves, celery powder or leaves, cinnamon, cumin powder, curry powder, ginger, mace, marjoram, onion or garlic powder, oregano, paprika, parsley flakes, ground pepper, rosemary, sage, savory, tarragon, thyme, turmeric.  Avoid: Coconut, honey. Document Released: 11/08/2003 Document  Revised: 05/12/2012 Document Reviewed: 01/02/2012 [facility] Patient Information 2014 [place].

## 2013-11-19 LAB — URINALYSIS
Glucose, UA: NEGATIVE mg/dL
LEUKOCYTES UA: NEGATIVE
NITRITE: NEGATIVE
Protein, ur: NEGATIVE mg/dL
SPECIFIC GRAVITY, URINE: 1.027 (ref 1.005–1.030)
UROBILINOGEN UA: 1 mg/dL (ref 0.0–1.0)
pH: 6.5 (ref 5.0–8.0)

## 2013-11-20 LAB — URINE CULTURE: Colony Count: 60000

## 2013-11-21 ENCOUNTER — Encounter: Payer: Self-pay | Admitting: Family Medicine

## 2013-11-21 DIAGNOSIS — R112 Nausea with vomiting, unspecified: Secondary | ICD-10-CM | POA: Insufficient documentation

## 2013-11-21 DIAGNOSIS — J329 Chronic sinusitis, unspecified: Secondary | ICD-10-CM | POA: Insufficient documentation

## 2013-11-21 LAB — H. PYLORI ANTIBODY, IGG

## 2013-11-21 NOTE — Progress Notes (Signed)
Patient ID: Melissa [name], female   DOB: [birth year], 23 y.o.   MRN: [id number] Danine Hor [id number] 1990-11-28 11/21/2013      Progress Note-Follow Up  Subjective  Chief Complaint  Chief Complaint  Patient presents with  . Sinusitis    alot of pressure in head, cough and blowing nose yellow phlegm X 1 week, drowsy and fever    HPI  Patient is a 23 year old femal in today for routine medical care. Condition is struggling with sinus pressure and cough. Has a headache and nasal congestion with yellow phlegm. Symptoms have been present for over a week. She's fevers, chills, malaise and myalgias. She has been using Tylenol and axial when necessary for pain relief but it's been minimally helpful. She has had some epistaxis as well. Is also complaining of nausea and vomiting with some epigastric discomfort and heartburn over the last couple of days. Has an appointment with gastroenterology in May and was seen in ER last week. 3 Past Medical History  Diagnosis Date  . Asthma     activity induced  . Abnormal Pap smear of cervix 07/11/2011  . Polycystic ovarian syndrome   . Irregular menses   . Acid reflux     Past Surgical History  Procedure Laterality Date  . No past surgeries      Family History  Problem Relation Age of Onset  . Gallbladder disease Mother   . Hypertension Father   . Other Father     Hepatitis A  . Cancer Maternal Grandmother     stomach cancer  . Emphysema Maternal Grandfather   . Heart disease Paternal Grandfather     History   Social History  . Marital Status: Married    Spouse Name: N/A    Number of Children: 0  . Years of Education: N/A   Occupational History  . Not on file.   Social History Main Topics  . Smoking status: Never Smoker   . Smokeless tobacco: Never Used  . Alcohol Use: No  . Drug Use: No  . Sexual Activity: Yes    Birth Control/ Protection: None   Other Topics Concern  . Not on file   Social History Narrative   Caffeine  use:  1 can soda daily   Regular exercise:  Just started   2 sisters.     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   Married   No children.    Denies drugs, smoking, or ETOH.          Current Outpatient Prescriptions on File Prior to Visit  Medication Sig Dispense Refill  . omeprazole (PRILOSEC) 20 MG capsule Take 20 mg by mouth daily.      . ondansetron (ZOFRAN) 4 MG tablet Take 1 tablet (4 mg total) by mouth every 6 (six) hours.  12 tablet  0   No current facility-administered medications on file prior to visit.    Allergies  Allergen Reactions  . Benadryl [Diphenhydramine Hcl] Other (See Comments)    Heart races    Review of Systems  Review of Systems  Constitutional: Positive for fever, chills and malaise/fatigue.  HENT: Positive for congestion.   Eyes: Negative for double vision and discharge.  Respiratory: Positive for cough and sputum production. Negative for shortness of breath.   Cardiovascular: Negative for chest pain, palpitations and leg swelling.  Gastrointestinal: Positive for heartburn, nausea and vomiting. Negative  for diarrhea.  Genitourinary: Negative for dysuria.  Musculoskeletal: Negative for falls.  Skin: Negative for rash.  Neurological: Positive for headaches. Negative for loss of consciousness.  Endo/Heme/Allergies: Negative for polydipsia.  Psychiatric/Behavioral: Negative for depression and suicidal ideas. The patient is not nervous/anxious and does not have insomnia.     Objective  BP 100/78  Pulse 83  Temp(Src) 98.4 F (36.9 C) (Oral)  Ht 5\' 2"  (1.575 m)  SpO2 99%  LMP 10/18/2013  Physical Exam  Physical Exam  Constitutional: She is oriented to person, place, and time and well-developed, well-nourished, and in no distress. No distress.  HENT:  Head: Normocephalic and atraumatic.  Nasal mucosa is boggy and erythematous  Eyes: Conjunctivae are normal.  Neck:  Neck supple. No thyromegaly present.  Cardiovascular: Normal rate, regular rhythm and normal heart sounds.   No murmur heard. Pulmonary/Chest: Effort normal and breath sounds normal. She has no wheezes.  Abdominal: She exhibits no distension and no mass.  Musculoskeletal: She exhibits no edema.  Lymphadenopathy:    She has no cervical adenopathy.  Neurological: She is alert and oriented to person, place, and time.  Skin: Skin is warm and dry. No rash noted. She is not diaphoretic.  Psychiatric: Memory, affect and judgment normal.    Lab Results  Component Value Date   TSH 1.973 07/11/2011   Lab Results  Component Value Date   WBC 5.8 11/18/2013   HGB 13.1 11/18/2013   HCT 40.3 11/18/2013   MCV 79.8 11/18/2013   PLT 353 11/18/2013   Lab Results  Component Value Date   CREATININE 0.65 11/15/2013   BUN 11 11/15/2013   NA 138 11/15/2013   K 3.4* 11/15/2013   CL 97 11/15/2013   CO2 24 11/15/2013   Lab Results  Component Value Date   ALT 26 11/15/2013   AST 20 11/15/2013   ALKPHOS 82 11/15/2013   BILITOT 0.4 11/15/2013     Assessment & Plan  Sinusitis Started on Clindamycin. Encouraged increased rest and hydration, add probiotics, zinc such as Coldeze or Xicam. Treat fevers as needed  GERD (gastroesophageal reflux disease) Avoid offending foods, start probiotics. Do not eat large meals in late evening and consider raising head of bed. Omeprazole daily. H Pylori negative  Nausea with vomiting Ginger prn and Promethazine prn. Increase fluids

## 2013-11-21 NOTE — Assessment & Plan Note (Signed)
Started on Clindamycin. Encouraged increased rest and hydration, add probiotics, zinc such as Coldeze or Xicam. Treat fevers as needed

## 2013-11-21 NOTE — Assessment & Plan Note (Signed)
Ginger prn and Promethazine prn. Increase fluids

## 2013-11-21 NOTE — Assessment & Plan Note (Signed)
Avoid offending foods, start probiotics. Do not eat large meals in late evening and consider raising head of bed. Omeprazole daily. H Pylori negative

## 2013-11-22 ENCOUNTER — Telehealth: Payer: Self-pay

## 2013-11-22 ENCOUNTER — Other Ambulatory Visit: Payer: Self-pay | Admitting: Family Medicine

## 2013-11-22 DIAGNOSIS — R109 Unspecified abdominal pain: Secondary | ICD-10-CM

## 2013-11-22 NOTE — Telephone Encounter (Signed)
I have released her labs to [facility]. Please let her know I reordered a new ua and culture due to contaminated and she still has abdominal pain please have her come in to do. The send her in ciprofloxacin 500 mg po bid x 7 days to treat empirically since she does not feel any better.

## 2013-11-22 NOTE — Telephone Encounter (Signed)
I remember seeing her urine and sending a note saying her urine looks contaminated. Did this show up anywhere? I will release via My chart but I will have her come in for repeat UA due to she is not feeling better.

## 2013-11-22 NOTE — Telephone Encounter (Signed)
Pt left a message stating that she is wandering what her lab results are because she is not feeling any better?  Please advise?  Pt is on mychart so this could be released this way

## 2013-11-23 NOTE — Telephone Encounter (Signed)
Notified pt and she voices understanding. States she will return to provide another urine sample tomorrow. Lab order released. Advised her we would send antibiotic when she brings in the urine. Pt asks that we contact GI and ask them for earlier appt as pt states her abdominal pain, n/v are not getting any better. Notified [facility] and she will contact GI.

## 2013-11-24 NOTE — Telephone Encounter (Signed)
She has an appt on 3/27 with GI

## 2013-11-25 ENCOUNTER — Ambulatory Visit (INDEPENDENT_AMBULATORY_CARE_PROVIDER_SITE_OTHER): Payer: 59 | Admitting: Nurse Practitioner

## 2013-11-25 ENCOUNTER — Encounter: Payer: Self-pay | Admitting: Nurse Practitioner

## 2013-11-25 VITALS — BP 124/70 | HR 70 | Ht 62.0 in | Wt 214.6 lb

## 2013-11-25 DIAGNOSIS — K219 Gastro-esophageal reflux disease without esophagitis: Secondary | ICD-10-CM

## 2013-11-25 DIAGNOSIS — R1013 Epigastric pain: Secondary | ICD-10-CM

## 2013-11-25 DIAGNOSIS — R112 Nausea with vomiting, unspecified: Secondary | ICD-10-CM

## 2013-11-25 NOTE — Progress Notes (Signed)
HPI :  Patient is a 23 year old female, new to this practice, referred for evaluation of nausea and vomiting. Patient is a 2 year history of GERD, she takes daily omeprazole but feels like it has lost efficacy.  For taking a Zantac at bedtime. On February 1 she developed sudden knifelike pain in the epigastric area. A couple of weeks she had another episode of the same pain. Since then patient has continued to have epigastric pain but to a lesser degree. She does not drink alcohol the regular basis. She takes an occasional BC powder, no other NSAIDs. Bowel movements are normal. Weight is stable. She is using Phenergan for nausea, it helps.   Labs at PCP office : H. pylori IgG negative, normal white count, hemoglobin 13, MCV 79 .8,  LFTs normal. Urine pregnancy test negative. Abdominal ultrasound negative  Past Medical History  Diagnosis Date  . Asthma     activity induced  . Abnormal Pap smear of cervix 07/11/2011  . Polycystic ovarian syndrome   . Irregular menses   . Acid reflux   . Anxiety     Family History  Problem Relation Age of Onset  . Gallbladder disease Mother   . Hypertension Father   . Other Father     Hepatitis A  . Stomach cancer Maternal Grandmother   . Emphysema Maternal Grandfather   . Heart disease Paternal Grandfather   . Breast cancer      mat great aunts x 2  . Colon cancer      mat great uncles x 2  . Diabetes Maternal Grandmother   . Irritable bowel syndrome Sister   . Colon polyps Mother   . Colon polyps Maternal Grandmother    History  Substance Use Topics  . Smoking status: Never Smoker   . Smokeless tobacco: Never Used  . Alcohol Use: No   Current Outpatient Prescriptions  Medication Sig Dispense Refill  . omeprazole (PRILOSEC) 20 MG capsule Take 20 mg by mouth daily.      . ondansetron (ZOFRAN) 4 MG tablet Take 1 tablet (4 mg total) by mouth every 6 (six) hours.  12 tablet  0  . OVER THE COUNTER MEDICATION Vitaex Lovell Sheehan, 2 tabs every  day to regulate her cycle      . promethazine (PHENERGAN) 25 MG tablet Take 1 tablet (25 mg total) by mouth every 6 (six) hours as needed for nausea or vomiting.  30 tablet  1  . ranitidine (ZANTAC) 300 MG tablet Take 1 tablet (300 mg total) by mouth at bedtime.  30 tablet  1   No current facility-administered medications for this visit.   Allergies  Allergen Reactions  . Benadryl [Diphenhydramine Hcl] Other (See Comments)    Heart races   Review of Systems: All systems reviewed and negative except where noted in HPI.    US Abdomen Complete  11/15/2013   CLINICAL DATA:  Epigastric pain.  Nausea and vomiting.  EXAM: ULTRASOUND ABDOMEN COMPLETE  COMPARISON:  Abdominal ultrasound 07/11/2011.  FINDINGS: Gallbladder:  No gallstones or wall thickening visualized. No sonographic Hagey sign noted.  Common bile duct:  Diameter: 0.1 cm  Liver:  No focal lesion identified. Within normal limits in parenchymal echogenicity.  IVC:  No abnormality visualized.  Pancreas:  Visualized portion unremarkable.  Spleen:  Size and appearance within normal limits.  Right Kidney:  Length: 10.4 cm. Echogenicity within normal limits. No mass or hydronephrosis visualized.  Left Kidney:  Length: 11.9  cm. Echogenicity within normal limits. No mass or hydronephrosis visualized.  Abdominal aorta:  No aneurysm visualized.  Other findings:  None.  IMPRESSION: Negative exam.   Electronically Signed   By: Inge Rise M.D.   On: 11/15/2013 22:48     Physical Exam: BP 124/70  Pulse 70  Ht 5\' 2"  (1.575 m)  Wt 214 lb 9.6 oz (97.342 kg)  BMI 39.24 kg/m2  LMP 11/20/2013 Constitutional: Pleasant,well-developed, female in no acute distress. HEENT: Normocephalic and atraumatic. Conjunctivae are normal. No scleral icterus. Neck supple.  Cardiovascular: Normal rate, regular rhythm.  Pulmonary/chest: Effort normal and breath sounds normal. No wheezing, rales or rhonchi. Abdominal: Soft, nondistended, nontender. Bowel sounds  active throughout. There are no masses palpable. No hepatomegaly. Extremities: no edema Lymphadenopathy: No cervical adenopathy noted. Neurological: Alert and oriented to person place and time. Skin: Skin is warm and dry. No rashes noted. Psychiatric: Normal mood and affect. Behavior is normal.   ASSESSMENT AND PLAN:   23 year old female with nausea, vomiting and upper abdominal pain. LFTs and ultrasound were negative. She does not drink alcohol the regular basis. Takes an occasional BC powder. This could be functional but peptic ulcer disease should be excluded. Will schedule patient for EGD. The benefits, risks, and potential complications of EGD with possible biopsies were discussed with the patient and she agrees to proceed. Further recommendations following EGD results

## 2013-11-25 NOTE — Patient Instructions (Signed)
You have been scheduled for an endoscopy with propofol. Please follow written instructions given to you at your visit today. If you use inhalers (even only as needed), please bring them with you on the day of your procedure. 

## 2013-11-27 NOTE — Progress Notes (Signed)
i agree with the plan above 

## 2013-12-05 ENCOUNTER — Encounter: Payer: Self-pay | Admitting: Gastroenterology

## 2013-12-26 ENCOUNTER — Ambulatory Visit (AMBULATORY_SURGERY_CENTER): Payer: 59 | Admitting: Gastroenterology

## 2013-12-26 ENCOUNTER — Encounter: Payer: Self-pay | Admitting: Gastroenterology

## 2013-12-26 VITALS — BP 108/68 | HR 78 | Temp 97.7°F | Resp 14 | Ht 62.0 in | Wt 214.0 lb

## 2013-12-26 DIAGNOSIS — K219 Gastro-esophageal reflux disease without esophagitis: Secondary | ICD-10-CM

## 2013-12-26 DIAGNOSIS — R1013 Epigastric pain: Secondary | ICD-10-CM

## 2013-12-26 DIAGNOSIS — R112 Nausea with vomiting, unspecified: Secondary | ICD-10-CM

## 2013-12-26 MED ORDER — RANITIDINE HCL 300 MG PO TABS: 300.0000 mg | ORAL_TABLET | Freq: Every day | ORAL | Status: DC

## 2013-12-26 MED ORDER — SODIUM CHLORIDE 0.9 % IV SOLN: 500.0000 mL | INTRAVENOUS | Status: DC

## 2013-12-26 NOTE — Patient Instructions (Signed)
YOU HAD AN ENDOSCOPIC PROCEDURE TODAY AT THE [facility]: Refer to the procedure report that was given to you for any specific questions about what was found during the examination.  If the procedure report does not answer your questions, please call your gastroenterologist to clarify.  If you requested that your care partner not be given the details of your procedure findings, then the procedure report has been included in a sealed envelope for you to review at your convenience later.  YOU SHOULD EXPECT: Some feelings of bloating in the abdomen. Passage of more gas than usual.  Walking can help get rid of the air that was put into your GI tract during the procedure and reduce the bloating. If you had a lower endoscopy (such as a colonoscopy or flexible sigmoidoscopy) you may notice spotting of blood in your stool or on the toilet paper. If you underwent a bowel prep for your procedure, then you may not have a normal bowel movement for a few days.  DIET: Your first meal following the procedure should be a light meal and then it is ok to progress to your normal diet.  A half-sandwich or bowl of soup is an example of a good first meal.  Heavy or fried foods are harder to digest and may make you feel nauseous or bloated.  Likewise meals heavy in dairy and vegetables can cause extra gas to form and this can also increase the bloating.  Drink plenty of fluids but you should avoid alcoholic beverages for 24 hours.  ACTIVITY: Your care partner should take you home directly after the procedure.  You should plan to take it easy, moving slowly for the rest of the day.  You can resume normal activity the day after the procedure however you should NOT DRIVE or use heavy machinery for 24 hours (because of the sedation medicines used during the test).    SYMPTOMS TO REPORT IMMEDIATELY: A gastroenterologist can be reached at any hour.  During normal business hours, 8:30 AM to 5:00 PM Monday through Friday,  call [phone].  After hours and on weekends, please call the GI answering service at [phone] who will take a message and have the physician on call contact you.   Following upper endoscopy (EGD)  Vomiting of blood or coffee ground material  New chest pain or pain under the shoulder blades  Painful or persistently difficult swallowing  New shortness of breath  Fever of 100F or higher  Black, tarry-looking stools  FOLLOW UP: If any biopsies were taken you will be contacted by phone or by letter within the next 1-3 weeks.  Call your gastroenterologist if you have not heard about the biopsies in 3 weeks.  Our staff will call the home number listed on your records the next business day following your procedure to check on you and address any questions or concerns that you may have at that time regarding the information given to you following your procedure. This is a courtesy call and so if there is no answer at the home number and we have not heard from you through the emergency physician on call, we will assume that you have returned to your regular daily activities without incident.   Resume medications.  SIGNATURES/CONFIDENTIALITY: You and/or your care partner have signed paperwork which will be entered into your electronic medical record.  These signatures attest to the fact that that the information above on your After Visit Summary has been reviewed and is   Full responsibility of the confidentiality of this discharge information lies with you and/or your care-partner.

## 2013-12-26 NOTE — Progress Notes (Signed)
Report to pacu rn, vss, bbs=clear 

## 2013-12-26 NOTE — Op Note (Signed)
[facility]  [address] [place], [id number]   ENDOSCOPY PROCEDURE REPORT  PATIENT: Melissa [name], Melissa [name]  MR#: [id number] BIRTHDATE: [birth year] , 23  yrs. old GENDER: Female ENDOSCOPIST: Milus Banister, MD REFERRED BY:  Debbrah Alar, FNP PROCEDURE DATE:  12/26/2013 PROCEDURE:  EGD, diagnostic ASA CLASS:     Class II INDICATIONS:  epigastric pain, nausea, vomiting (normal cbc, lfts, [place]). MEDICATIONS: MAC sedation, administered by CRNA and Propofol (Diprivan) 170 mg IV TOPICAL ANESTHETIC: Cetacaine Spray  DESCRIPTION OF PROCEDURE: After the risks benefits and alternatives of the procedure were thoroughly explained, informed consent was obtained.  The LB [id number] endoscope was introduced through the mouth and advanced to the second portion of the duodenum. Without limitations.  The instrument was slowly withdrawn as the mucosa was fully examined.     The upper, middle and distal third of the esophagus were carefully inspected and no abnormalities were noted.  The z-line was well seen at the GEJ.  The endoscope was pushed into the fundus which was normal including a retroflexed view.  The antrum, gastric body, first and second part of the duodenum were unremarkable. Retroflexed views revealed no abnormalities.     The scope was then withdrawn from the patient and the procedure completed.  COMPLICATIONS: There were no complications.  ENDOSCOPIC IMPRESSION: Normal EGD  RECOMMENDATIONS: My office will call you to scheduled HIDA scan (test of your gallbladder function).    eSigned:  Milus Banister, MD 12/26/2013 4:09 PM

## 2013-12-26 NOTE — Progress Notes (Signed)
Pt. Emotional when first arrived to recovery, she attempted to get up stating I want to go home and she began crying. Talked to pt. To calm down and pt. Began pulling off gown and stating that she was hot. Curtain pulled and cool wash cloth placed on back of neck. Pt. Calmed down and smiling and talking to me prior to discharge.

## 2013-12-27 ENCOUNTER — Telehealth: Payer: Self-pay

## 2013-12-27 NOTE — Telephone Encounter (Signed)
HIDA scan (test of your  gallbladder function).

## 2013-12-27 NOTE — Telephone Encounter (Signed)
Left a message at # [phone] with ID of Toriana, to call us back if any questions or concerns. Maw

## 2013-12-28 NOTE — Telephone Encounter (Signed)
Left message on machine to call back  

## 2013-12-30 NOTE — Telephone Encounter (Signed)
pts phone does not take incoming calls letter sent to the pt via my chart

## 2014-01-02 ENCOUNTER — Ambulatory Visit: Payer: 59 | Admitting: Gastroenterology

## 2014-01-11 ENCOUNTER — Other Ambulatory Visit: Payer: Self-pay

## 2014-01-11 DIAGNOSIS — R109 Unspecified abdominal pain: Secondary | ICD-10-CM

## 2014-01-11 NOTE — Progress Notes (Signed)
You have been scheduled for a HIDA scan at [facility] (1st floor) on 01/27/14. Please arrive 15 minutes prior to your scheduled appointment at  315 am. Make certain not to have anything to eat or drink at least 6 hours prior to your test. Should this appointment date or time not work well for you, please call radiology scheduling at [phone].  _____________________________________________________________________ hepatobiliary (HIDA) scan is an imaging procedure used to diagnose problems in the liver, gallbladder and bile ducts. In the HIDA scan, a radioactive chemical or tracer is injected into a vein in your arm. The tracer is handled by the liver like bile. Bile is a fluid produced and excreted by your liver that helps your digestive system break down fats in the foods you eat. Bile is stored in your gallbladder and the gallbladder releases the bile when you eat a meal. A special nuclear medicine scanner (gamma camera) tracks the flow of the tracer from your liver into your gallbladder and small intestine.  During your HIDA scan  You'll be asked to change into a hospital gown before your HIDA scan begins. Your health care team will position you on a table, usually on your back. The radioactive tracer is then injected into a vein in your arm.The tracer travels through your bloodstream to your liver, where it's taken up by the bile-producing cells. The radioactive tracer travels with the bile from your liver into your gallbladder and through your bile ducts to your small intestine.You may feel some pressure while the radioactive tracer is injected into your vein. As you lie on the table, a special gamma camera is positioned over your abdomen taking pictures of the tracer as it moves through your body. The gamma camera takes pictures continually for about an hour. You'll need to keep still during the HIDA scan. This can become uncomfortable, but you may find that you can lessen the discomfort by  taking deep breaths and thinking about other things. Tell your health care team if you're uncomfortable. The radiologist will watch on a computer the progress of the radioactive tracer through your body. The HIDA scan may be stopped when the radioactive tracer is seen in the gallbladder and enters your small intestine. This typically takes about an hour. In some cases extra imaging will be performed if original images aren't satisfactory, if morphine is given to help visualize the gallbladder or if the medication CCK is given to look at the contraction of the gallbladder. This test typically takes 2 hours to complete. ________________________________________________________________________ Pt has been instructed and will call with any questions or concerns

## 2014-01-17 ENCOUNTER — Encounter: Payer: Self-pay | Admitting: Family Medicine

## 2014-01-17 ENCOUNTER — Other Ambulatory Visit: Payer: Self-pay | Admitting: Family Medicine

## 2014-01-17 DIAGNOSIS — N390 Urinary tract infection, site not specified: Secondary | ICD-10-CM

## 2014-01-17 DIAGNOSIS — K219 Gastro-esophageal reflux disease without esophagitis: Secondary | ICD-10-CM

## 2014-01-17 MED ORDER — PROMETHAZINE HCL 25 MG PO TABS: 25.0000 mg | ORAL_TABLET | Freq: Four times a day (QID) | ORAL | Status: DC | PRN

## 2014-01-26 LAB — URINALYSIS
Bilirubin Urine: NEGATIVE
Glucose, UA: NEGATIVE mg/dL
Ketones, ur: NEGATIVE mg/dL
NITRITE: NEGATIVE
PH: 6 (ref 5.0–8.0)
Protein, ur: 100 mg/dL — AB
Specific Gravity, Urine: >1.03 — ABNORMAL HIGH (ref 1.005–1.030)
Urobilinogen, UA: 0.2 mg/dL (ref 0.0–1.0)

## 2014-01-26 LAB — URINE CULTURE: Colony Count: >=100000

## 2014-01-26 MED ORDER — CIPROFLOXACIN HCL 250 MG PO TABS: 250.0000 mg | ORAL_TABLET | Freq: Two times a day (BID) | ORAL | Status: DC

## 2014-01-26 NOTE — Addendum Note (Signed)
Addended by: Varney Daily on: 01/26/2014 05:08 PM   Modules accepted: Orders

## 2014-01-27 ENCOUNTER — Ambulatory Visit (HOSPITAL_COMMUNITY)
Admission: RE | Admit: 2014-01-27 | Discharge: 2014-01-27 | Disposition: A | Discharge status: Home / Self Care | Payer: 59 | Source: Ambulatory Visit | Attending: Gastroenterology | Admitting: Gastroenterology

## 2014-01-27 DIAGNOSIS — R112 Nausea with vomiting, unspecified: Secondary | ICD-10-CM | POA: Insufficient documentation

## 2014-01-27 DIAGNOSIS — R109 Unspecified abdominal pain: Secondary | ICD-10-CM | POA: Insufficient documentation

## 2014-01-27 MED ORDER — TECHNETIUM TC 99M MEBROFENIN IV KIT
5.0000 | PACK | Freq: Once | INTRAVENOUS | Status: AC | PRN
  Administered 2014-01-27: 5 via INTRAVENOUS

## 2014-03-14 ENCOUNTER — Telehealth: Payer: Self-pay | Admitting: Family Medicine

## 2014-03-14 ENCOUNTER — Ambulatory Visit (INDEPENDENT_AMBULATORY_CARE_PROVIDER_SITE_OTHER): Payer: 59 | Admitting: Nurse Practitioner

## 2014-03-14 ENCOUNTER — Encounter: Payer: Self-pay | Admitting: Nurse Practitioner

## 2014-03-14 VITALS — BP 102/69 | HR 75 | Temp 98.0°F | Ht 62.0 in

## 2014-03-14 DIAGNOSIS — S8000XA Contusion of unspecified knee, initial encounter: Secondary | ICD-10-CM

## 2014-03-14 DIAGNOSIS — W138XXA Fall from, out of or through other building or structure, initial encounter: Secondary | ICD-10-CM

## 2014-03-14 DIAGNOSIS — W130XXA Fall from, out of or through balcony, initial encounter: Secondary | ICD-10-CM | POA: Insufficient documentation

## 2014-03-14 DIAGNOSIS — S8010XA Contusion of unspecified lower leg, initial encounter: Secondary | ICD-10-CM

## 2014-03-14 NOTE — Progress Notes (Signed)
Pre visit review using our clinic review tool, if applicable. No additional management support is needed unless otherwise documented below in the visit note. 

## 2014-03-14 NOTE — Patient Instructions (Signed)
Ice L knee 3 times daily for 10 minutes for few days.  Hot bath or heat pack for sore leg muscles couple times daily for several days.  Take ibuprophen 400 mg twice daily for few days with food, then as needed for pain. Swelling should improve within 4-6 days. Bruising will be resolve over 2-3 weeks. Wound care: clean with hydrogen peroxide to remove dried blood. Then wash daily with mild soap-Dove bar soap. Pat dry. Apply thin layer vaseline for about 1 week.  Contusion A contusion is a deep bruise. Contusions are the result of an injury that caused bleeding under the skin. The contusion may turn blue, purple, or yellow. Minor injuries will give you a painless contusion, but more severe contusions may stay painful and swollen for a few weeks.  CAUSES  A contusion is usually caused by a blow, trauma, or direct force to an area of the body. SYMPTOMS   Swelling and redness of the injured area.  Bruising of the injured area.  Tenderness and soreness of the injured area.  Pain. DIAGNOSIS  The diagnosis can be made by taking a history and physical exam. An X-ray, CT scan, or MRI may be needed to determine if there were any associated injuries, such as fractures. TREATMENT  Specific treatment will depend on what area of the body was injured. In general, the best treatment for a contusion is resting, icing, elevating, and applying cold compresses to the injured area. Over-the-counter medicines may also be recommended for pain control. Ask your caregiver what the best treatment is for your contusion. HOME CARE INSTRUCTIONS   Put ice on the injured area.  Put ice in a plastic bag.  Place a towel between your skin and the bag.  Leave the ice on for 15-20 minutes, 3-4 times a day, or as directed by your health care provider.  Only take over-the-counter or prescription medicines for pain, discomfort, or fever as directed by your caregiver. Your caregiver may recommend avoiding anti-inflammatory  medicines (aspirin, ibuprofen, and naproxen) for 48 hours because these medicines may increase bruising.  Rest the injured area.  If possible, elevate the injured area to reduce swelling. SEEK IMMEDIATE MEDICAL CARE IF:   You have increased bruising or swelling.  You have pain that is getting worse.  Your swelling or pain is not relieved with medicines. MAKE SURE YOU:   Understand these instructions.  Will watch your condition.  Will get help right away if you are not doing well or get worse. Document Released: 05/28/2005 Document Revised: 08/23/2013 Document Reviewed: 06/23/2011 [facility] Patient Information 2015 [place], [place]. This information is not intended to replace advice given to you by your health care provider. Make sure you discuss any questions you have with your health care provider.

## 2014-03-14 NOTE — Telephone Encounter (Signed)
Patient Information:  Caller Name: Loa Socks  Phone: [phone]  Patient: Melissa [name]  Gender: Female  DOB: 1990/09/21  Age: 23 Years  PCP: Debbrah Alar (Adults only)  Pregnant: No  Office Follow Up:  Does the office need to follow up with this patient?: Yes  Instructions For The Office: Please call.  RN Note:  Pt was on a wooden deck that gave way on 03/13/14. She fell about 10 feet. Now has swelling to her L lower leg and ankle and some small lacerations on other parts of her body. Ambulatory. No obvious deformity.  Symptoms  Reason For Call & Symptoms: Fall  Reviewed Health History In EMR: Yes  Reviewed Medications In EMR: Yes  Reviewed Allergies In EMR: Yes  Reviewed Surgeries / Procedures: Yes  Date of Onset of Symptoms: 03/13/2014  Treatments Tried: Ibuprofen  Treatments Tried Worked: No OB / GYN:  LMP: Unknown  Guideline(s) Used:  Leg Injury  Disposition Per Guideline:   See Today in Office  Reason For Disposition Reached:   Patient wants to be seen  Advice Given:  Reassurance - Direct Blow (Contusion, Bruise)  A direct blow to your leg can cause a contusion. Contusion is the medical term for bruise.  Symptoms are mild pain, swelling, and/or bruising.  Apply a Cold Pack:  Apply a cold pack or an ice bag (wrapped in a moist towel) to the area for 20 minutes. Repeat in 1 hour, then every 4 hours while awake.  Apply Heat to the Area:  Beginning 48 hours after an injury, apply a warm washcloth or heating pad for 10 minutes three times a day.  This will help increase blood flow and improve healing.  Elevate the Leg:  Lay down and put your leg on a pillow. This puts (elevates) the leg above the heart.  This can also help decrease swelling, bruising, and pain.  Patient Will Follow Care Advice:  YES

## 2014-03-14 NOTE — Telephone Encounter (Signed)
Attempted to reach pt and left message on her voicemail to call to arrange appt or let us know if she has appt with another Provider.

## 2014-03-14 NOTE — Progress Notes (Signed)
   Subjective:    Patient ID: Melissa [name], female    DOB: February 12, 1991, 23 y.o.   MRN: [id number]  HPI Comments: Pt presents with injuries to bilateral legs, back & buttocks after fall from collapsed deck at her aunt's house. Deck was about 7 feet high. She was holding a baby when deck collapsed. Baby did not suffer injuries. She denies hitting her head.  Fall The accident occurred 12 to 24 hours ago (yesterday). The fall occurred while standing (standing on deck-about 56ft high, holding baby, deck collapsed.). She fell from a height of 6 to 10 ft. She landed on dirt (landed on some old boards). There was no blood loss. The point of impact was the buttocks and left knee. The pain is present in the right upper leg, right lower leg, left lower leg and left knee. The pain is moderate. The symptoms are aggravated by ambulation. Pertinent negatives include no abdominal pain, fever, headaches, nausea, numbness, tingling or vomiting. She has tried nothing for the symptoms.      Review of Systems  Constitutional: Negative for fever, chills and appetite change.  Cardiovascular: Positive for leg swelling. Negative for chest pain and palpitations.  Gastrointestinal: Negative for nausea, vomiting and abdominal pain.  Musculoskeletal: Positive for back pain and myalgias. Negative for joint swelling and neck pain.  Skin: Positive for color change (bruises back of legs) and wound.  Neurological: Negative for dizziness, tingling, numbness and headaches.       Objective:   Physical Exam  Vitals reviewed. Constitutional: She is oriented to person, place, and time. She appears well-developed and well-nourished. No distress.  HENT:  Head: Normocephalic and atraumatic.  Eyes: Conjunctivae are normal. Right eye exhibits no discharge. Left eye exhibits no discharge.  Cardiovascular: Normal rate.   Pulmonary/Chest: Effort normal. No respiratory distress.  Abdominal: Soft. She exhibits no distension and no mass.  There is no tenderness. There is no rebound and no guarding.  Musculoskeletal: She exhibits edema and tenderness.       Legs: Neurological: She is alert and oriented to person, place, and time.  Skin: Skin is warm and dry.  3" superficial linear scrape R upper thigh, scant dried blood, mild erythema.  Psychiatric: She has a normal mood and affect. Her behavior is normal. Thought content normal.          Assessment & Plan:  1. Fall from balcony, initial encounter Contusion bilat lower legs & L knee, scrape R thigh & backs of bilat thighs. See pt instructions. F/u PRN.

## 2014-03-15 ENCOUNTER — Encounter: Payer: Self-pay | Admitting: Nurse Practitioner

## 2014-03-15 ENCOUNTER — Other Ambulatory Visit: Payer: Self-pay | Admitting: Nurse Practitioner

## 2014-03-15 ENCOUNTER — Telehealth: Payer: Self-pay | Admitting: *Deleted

## 2014-03-15 DIAGNOSIS — M791 Myalgia, unspecified site: Secondary | ICD-10-CM

## 2014-03-15 MED ORDER — METHOCARBAMOL 500 MG PO TABS: 1000.0000 mg | ORAL_TABLET | Freq: Two times a day (BID) | ORAL | Status: DC | PRN

## 2014-03-15 NOTE — Progress Notes (Signed)
Pt called, having pain in legs, unrelieved by ibuprophen. Will prescribe robaxin, may continue tylenol & ibuprophen.

## 2014-03-15 NOTE — Telephone Encounter (Signed)
Patient called office requesting a "pain medication" for pain she is having due to fall. Please advise? Advised pt that it may be tomorrow before we get back to her.

## 2014-03-15 NOTE — Telephone Encounter (Signed)
I will send muscle relaxer to pharm. She may continue to use ibuprophen and tylenol: 400 mg ibuprophen and 1000 mg tylenol, taken together. Muscle relaxer will make her drowsy, she should not drive after taking.

## 2014-03-15 NOTE — Telephone Encounter (Signed)
Patient notified

## 2014-06-16 ENCOUNTER — Other Ambulatory Visit: Payer: Self-pay

## 2014-07-04 DIAGNOSIS — N970 Female infertility associated with anovulation: Secondary | ICD-10-CM | POA: Insufficient documentation

## 2014-08-07 ENCOUNTER — Ambulatory Visit (INDEPENDENT_AMBULATORY_CARE_PROVIDER_SITE_OTHER): Payer: 59 | Admitting: Nurse Practitioner

## 2014-08-07 ENCOUNTER — Encounter: Payer: Self-pay | Admitting: Nurse Practitioner

## 2014-08-07 VITALS — BP 134/83 | HR 78 | Temp 98.2°F | Resp 18 | Ht 62.0 in | Wt 216.0 lb

## 2014-08-07 DIAGNOSIS — L0291 Cutaneous abscess, unspecified: Secondary | ICD-10-CM

## 2014-08-07 MED ORDER — MUPIROCIN 2 % EX OINT: 1.0000 "application " | TOPICAL_OINTMENT | Freq: Three times a day (TID) | CUTANEOUS | Status: DC

## 2014-08-07 NOTE — Progress Notes (Signed)
Pre visit review using our clinic review tool, if applicable. No additional management support is needed unless otherwise documented below in the visit note. 

## 2014-08-07 NOTE — Progress Notes (Signed)
Subjective:     Melissa [name] is a 23 y.o. female presents w/c/o skin lesion on forehead. Painful & swollen for about 2 days. She has not put anything on lesion.  The following portions of the patient's history were reviewed and updated as appropriate: allergies, current medications, past medical history, past social history, past surgical history and problem list.  Review of Systems Constitutional: negative for fevers Integument/breast: positive for skin lesion(s), negative for drainage, previous lesions    Objective:    BP 134/83 mmHg  Pulse 78  Temp(Src) 98.2 F (36.8 C) (Oral)  Resp 18  Ht 5\' 2"  (1.575 m)  Wt 216 lb (97.977 kg)  BMI 39.50 kg/m2  SpO2 98%  LMP 08/07/2014 BP 134/83 mmHg  Pulse 78  Temp(Src) 98.2 F (36.8 C) (Oral)  Resp 18  Ht 5\' 2"  (1.575 m)  Wt 216 lb (97.977 kg)  BMI 39.50 kg/m2  SpO2 98%  LMP 08/07/2014 General appearance: alert, cooperative, appears stated age, mild distress and she has been crying-states not r/t skin lesion Head: Normocephalic, without obvious abnormality, atraumatic Eyes: negative findings: lids and lashes normal and conjunctiva injected-pt has been crying Skin: pustular lesion L forehead. swollen, mild erythema, tender to touch    Procedure: cleansed lesion w/alcohol pad. Pierced lesion using bevel depth of 23 ga needle. Moderate amount of purulent exudate expressed when pressure applied. Swelling reduced. Lesion cleansed. Thin layer of bacitracin applied. Band aid applied. Assessment:Plan     1. Abscess forehead - mupirocin ointment (BACTROBAN) 2 %; Apply 1 application topically 3 (three) times daily. Apply to forehead lesion.  Dispense: 30 g; Refill: 0  See patient instructions for complete plan. F/u PRN

## 2014-08-07 NOTE — Patient Instructions (Signed)
Wash twice daily with mild soap-Dove bar soap. Apply bactroban cream 3 times daily. Continue to try to express drainage daily. Let us know if it is not smaller by end of week.

## 2014-09-08 ENCOUNTER — Ambulatory Visit: Payer: Self-pay | Admitting: Medical

## 2014-09-12 ENCOUNTER — Encounter: Payer: Self-pay | Admitting: Family

## 2014-09-12 ENCOUNTER — Ambulatory Visit (INDEPENDENT_AMBULATORY_CARE_PROVIDER_SITE_OTHER): Payer: 59 | Admitting: Family

## 2014-09-12 VITALS — BP 120/80 | HR 93 | Temp 97.9°F | Resp 18 | Wt 216.2 lb

## 2014-09-12 DIAGNOSIS — R0683 Snoring: Secondary | ICD-10-CM

## 2014-09-12 DIAGNOSIS — R635 Abnormal weight gain: Secondary | ICD-10-CM

## 2014-09-12 DIAGNOSIS — L83 Acanthosis nigricans: Secondary | ICD-10-CM

## 2014-09-12 LAB — BASIC METABOLIC PANEL
BUN: 14 mg/dL (ref 6–23)
CHLORIDE: 106 meq/L (ref 96–112)
CO2: 26 meq/L (ref 19–32)
Calcium: 8.7 mg/dL (ref 8.4–10.5)
Creatinine, Ser: 0.6 mg/dL (ref 0.4–1.2)
GFR: 155.5 mL/min (ref >60.00)
Glucose, Bld: 88 mg/dL (ref 70–99)
Potassium: 3.7 mEq/L (ref 3.5–5.1)
SODIUM: 137 meq/L (ref 135–145)

## 2014-09-12 LAB — HEMOGLOBIN A1C: Hgb A1c MFr Bld: 6 % (ref 4.6–6.5)

## 2014-09-12 NOTE — Assessment & Plan Note (Addendum)
Discussed reducing her risk of diabetes through diet and exercise. Instructed her to avoid concentrated sweets, white fluffy carbs and  make sure to get regular exercise.  Follow up in 6 months. Check A1c and BMET.

## 2014-09-12 NOTE — Assessment & Plan Note (Signed)
She reports snoring, sometimes even waking herself up. Feels fatigued after sleeping all night. Will order home sleep study.

## 2014-09-12 NOTE — Assessment & Plan Note (Deleted)
Discussed reducing her risk of diabetes through diet and exercise. Instructed her to avoid concentrated sweets, white fluffy carbs and  make sure to get regular exercise.  Follow up in 6 months.

## 2014-09-12 NOTE — Progress Notes (Signed)
Pre visit review using our clinic review tool, if applicable. No additional management support is needed unless otherwise documented below in the visit note. 

## 2014-09-12 NOTE — Progress Notes (Signed)
Subjective:    Patient ID: Melissa [name], female    DOB: 07/03/1991, 24 y.o.   MRN: [id number]  HPI 1. Melissa [name] is here today for a rash on her neck for a little over a week. She reports it is pruritic and scaly. Picture she showed from last week shows a white scaly area over a hyperpigmented area on posterior neck. Has used diaper rash cream and A&D ointment without relief. She thinks the rash is related to her PCOS.  2. She is also interested in being tested for diabetes due to her PCOS. Mother has diabetes. Wt Readings from Last 3 Encounters:  09/12/14 216 lb 3.2 oz (98.068 kg)  08/07/14 216 lb (97.977 kg)  12/26/13 214 lb (97.07 kg)   3. Insomnia - has had trouble going to sleep and staying asleep for two years. She does not know of any precipitating factor. Has tried melatonin without success.  Works until 12:30 AM, goes to sleep 2:30-3:00. Wakes up at 8 feels unrested.  Hears herself snoring, wakes herself up.  Falls asleep with benadryl, but then can't stay asleep.    Review of Systems  Constitutional: Negative for fever.  Endocrine: Positive for polydipsia. Negative for polyphagia and polyuria.   Past Medical History  Diagnosis Date  . Asthma     activity induced  . Abnormal Pap smear of cervix 07/11/2011  . Polycystic ovarian syndrome   . Irregular menses   . Acid reflux   . Anxiety     History   Social History  . Marital Status: Married    Spouse Name: N/A    Number of Children: 0  . Years of Education: N/A   Occupational History  . student    Social History Main Topics  . Smoking status: Never Smoker   . Smokeless tobacco: Never Used  . Alcohol Use: No  . Drug Use: No  . Sexual Activity: Yes    Birth Control/ Protection: None   Other Topics Concern  . Not on file   Social History Narrative   Caffeine use:  1 can soda daily   Regular exercise:  Just started   2 sisters.     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   Married   No children.    Denies drugs, smoking, or ETOH.          Past Surgical History  Procedure Laterality Date  . No past surgeries      Family History  Problem Relation Age of Onset  . Gallbladder disease Mother   . Hypertension Father   . Other Father     Hepatitis A  . Stomach cancer Maternal Grandmother   . Emphysema Maternal Grandfather   . Heart disease Paternal Grandfather   . Breast cancer      mat great aunts x 2  . Colon cancer      mat great uncles x 2  . Diabetes Maternal Grandmother   . Irritable bowel syndrome Sister   . Colon polyps Mother   . Colon polyps Maternal Grandmother     Allergies  Allergen Reactions  . Benadryl [Diphenhydramine Hcl] Other (See Comments)    Heart races    Current Outpatient Prescriptions on File Prior to Visit  Medication Sig Dispense Refill  . omeprazole (PRILOSEC) 20 MG capsule Take 20 mg by mouth daily.    Marland Kitchen OVER THE COUNTER  MEDICATION Vitaex Lovell Sheehan, 2 tabs every day to regulate her cycle    . promethazine (PHENERGAN) 25 MG tablet Take 1 tablet (25 mg total) by mouth every 6 (six) hours as needed for nausea or vomiting. 30 tablet 1  . ranitidine (ZANTAC) 300 MG tablet Take 1 tablet (300 mg total) by mouth at bedtime. 30 tablet 11   No current facility-administered medications on file prior to visit.    BP 120/80 mmHg  Pulse 93  Temp(Src) 97.9 F (36.6 C) (Oral)  Resp 18  Wt 216 lb 3.2 oz (98.068 kg)  SpO2 94%  LMP 09/12/2014       Objective:   Physical Exam  Constitutional: She is oriented to person, place, and time. No distress.  Obese, cooperative.  Cardiovascular: Normal rate, regular rhythm and normal heart sounds.  Exam reveals no gallop and no friction rub.   No murmur heard. Pulmonary/Chest: Breath sounds normal. No respiratory distress. She has no wheezes. She has no rales.  Neurological: She is alert and oriented to person, place, and time.   Skin: Skin is warm and dry. She is not diaphoretic.  Hyperpigmented area noted in posterior neck skin fold. No rash noted.  Psychiatric:  Flat affect.          Assessment & Plan:  Patient seen along with Dawn Whitmire NP-student.  I have personally seen and examined patient and agree with Ms. Whitmire's assessment and plan- Debbrah Alar NP

## 2014-09-12 NOTE — Assessment & Plan Note (Addendum)
Instructed her keep her neck moisturized and use OTC hydrocortisone cream if she develops dry patches or scales.  She desires testing for diabtetes due to her family hx (mother) and PCOS. Will do A1c and BMET today.

## 2014-09-12 NOTE — Patient Instructions (Signed)
Please complete your lab work prior to leaving. We will contact you about your sleep study. Avoid concentrated sweets, white fluffy carbs. Make sure to get regular exercise. Work on weight loss.   Follow up in 6 months.

## 2014-09-13 ENCOUNTER — Encounter: Payer: Self-pay | Admitting: Family

## 2014-11-09 ENCOUNTER — Telehealth: Payer: Self-pay | Admitting: Family

## 2014-11-09 NOTE — Telephone Encounter (Signed)
See my chart message

## 2014-11-22 ENCOUNTER — Other Ambulatory Visit: Payer: Self-pay | Admitting: Gastroenterology

## 2014-11-29 ENCOUNTER — Other Ambulatory Visit: Payer: Self-pay | Admitting: Gastroenterology

## 2014-12-13 ENCOUNTER — Ambulatory Visit: Payer: 59 | Admitting: Medical

## 2014-12-15 ENCOUNTER — Ambulatory Visit: Payer: Self-pay | Admitting: Medical

## 2014-12-18 ENCOUNTER — Telehealth: Payer: Self-pay | Admitting: *Deleted

## 2014-12-18 NOTE — Telephone Encounter (Signed)
Pt dropped off form for Dept of SS for foster/adoptive parent. Forwarded to [facility] (per pt request). JG//CMA

## 2015-01-01 NOTE — Telephone Encounter (Signed)
Form completed. Informed pt. Copy sent for scanning. JG//CMA

## 2015-01-23 ENCOUNTER — Encounter: Payer: Self-pay | Admitting: Physician Assistant

## 2015-01-23 ENCOUNTER — Telehealth: Payer: Self-pay | Admitting: *Deleted

## 2015-01-23 ENCOUNTER — Ambulatory Visit (INDEPENDENT_AMBULATORY_CARE_PROVIDER_SITE_OTHER): Payer: 59 | Admitting: Physician Assistant

## 2015-01-23 VITALS — BP 106/58 | HR 76 | Temp 98.2°F | Ht 62.0 in | Wt 203.0 lb

## 2015-01-23 DIAGNOSIS — M76899 Other specified enthesopathies of unspecified lower limb, excluding foot: Secondary | ICD-10-CM

## 2015-01-23 DIAGNOSIS — M76891 Other specified enthesopathies of right lower limb, excluding foot: Secondary | ICD-10-CM

## 2015-01-23 HISTORY — DX: Other specified enthesopathies of unspecified lower limb, excluding foot: M76.899

## 2015-01-23 MED ORDER — MELOXICAM 15 MG PO TABS
15.0000 mg | ORAL_TABLET | Freq: Every day | ORAL | Status: DC
Start: 2015-01-23 — End: 2015-03-02

## 2015-01-23 MED ORDER — CYCLOBENZAPRINE HCL 10 MG PO TABS: 10.0000 mg | ORAL_TABLET | Freq: Every day | ORAL | Status: DC

## 2015-01-23 NOTE — Progress Notes (Signed)
Pre visit review using our clinic review tool, if applicable. No additional management support is needed unless otherwise documented below in the visit note. 

## 2015-01-23 NOTE — Telephone Encounter (Signed)
Pt was in the office today and has concerns about Medical Evaluation form we completed on 12/20/14 for [employer]. Pt wanted to know if we could some other BP reading than the one we took at 09/12/14 office visit (120/80). I advised pt this is a normal reading and not to be concerned. Pt also wants mental health section addended as she states she has never had depression and feels that she will not be able to obtain a foster child if there has been mention of a mental health diagnosis.  I gave pt printouts of 09/12/14 and 07/11/11 office notes that supported the findings on this form. Pt requests review by PCP.  Please advise.

## 2015-01-23 NOTE — Progress Notes (Signed)
Patient presents to clinic today c/o R leg pain that first began yesterday while doing squats at the gym  States she noted "popping" sound and then pain began.  Denies numbness, tingling or weakness. Endorses pain is worse with ambulating.  Is taking ibuprofen with no relief of symptoms.  Denies prior injury to leg.  Past Medical History  Diagnosis Date  . Asthma     activity induced  . Abnormal Pap smear of cervix 07/11/2011  . Polycystic ovarian syndrome   . Irregular menses   . Acid reflux   . Anxiety     Current Outpatient Prescriptions on File Prior to Visit  Medication Sig Dispense Refill  . omeprazole (PRILOSEC) 20 MG capsule Take 20 mg by mouth daily.    Marland Kitchen OVER THE COUNTER MEDICATION Vitaex Lovell Sheehan, 2 tabs every day to regulate her cycle    . ranitidine (ZANTAC) 300 MG tablet TAKE 1 TABLET (300 MG TOTAL) BY MOUTH AT BEDTIME. 30 tablet 1  . promethazine (PHENERGAN) 25 MG tablet Take 1 tablet (25 mg total) by mouth every 6 (six) hours as needed for nausea or vomiting. (Patient not taking: Reported on 01/23/2015) 30 tablet 1   No current facility-administered medications on file prior to visit.    Allergies  Allergen Reactions  . Benadryl [Diphenhydramine Hcl] Other (See Comments)    Heart races    Family History  Problem Relation Age of Onset  . Gallbladder disease Mother   . Hypertension Father   . Other Father     Hepatitis A  . Stomach cancer Maternal Grandmother   . Emphysema Maternal Grandfather   . Heart disease Paternal Grandfather   . Breast cancer      mat great aunts x 2  . Colon cancer      mat great uncles x 2  . Diabetes Maternal Grandmother   . Irritable bowel syndrome Sister   . Colon polyps Mother   . Colon polyps Maternal Grandmother     History   Social History  . Marital Status: Married    Spouse Name: N/A  . Number of Children: 0  . Years of Education: N/A   Occupational History  . student    Social History Main Topics  .  Smoking status: Never Smoker   . Smokeless tobacco: Never Used  . Alcohol Use: No  . Drug Use: No  . Sexual Activity: Yes    Birth Control/ Protection: None   Other Topics Concern  . None   Social History Narrative   Caffeine use:  1 can soda daily   Regular exercise:  Just started   2 sisters.     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   Married   No children.    Denies drugs, smoking, or ETOH.         Review of Systems - See HPI.  All other ROS are negative.  BP 106/58 mmHg  Pulse 76  Temp(Src) 98.2 F (36.8 C) (Oral)  Ht 5\' 2"  (1.575 m)  Wt 203 lb (92.08 kg)  BMI 37.12 kg/m2  SpO2 100%  LMP 01/16/2015  Physical Exam  Constitutional: She is oriented to person, place, and time and well-developed, well-nourished, and in no distress.  HENT:  Head: Normocephalic and atraumatic.  Eyes: Conjunctivae are normal. Pupils are equal, round, and reactive to light.  Cardiovascular: Normal rate, regular rhythm, normal  heart sounds and intact distal pulses.   Pulmonary/Chest: Effort normal and breath sounds normal. No respiratory distress. She has no wheezes. She has no rales. She exhibits no tenderness.  Musculoskeletal:       Legs: Neurological: She is alert and oriented to person, place, and time.  Skin: Skin is warm and dry. No rash noted.  Psychiatric: Affect normal.  Vitals reviewed.  Assessment/Plan: Hip abductor tendinitis Rx mobic daily with food.  ES tylenol for breakthrough pain.  RICE discussed.  Topical Aspercreme to the area. Follow-up in 1 week.

## 2015-01-23 NOTE — Patient Instructions (Signed)
Please take Meloxicam daily as directed with food.  Use Extra Strength tylenol for breakthrough pain. Avoid lying on affected side. No exercising for the next week.   Alternate ice and heat to the area. Apply topical Aspercreme to the lateral and inside hip. Follow-up with Melissa in 1 week.

## 2015-01-23 NOTE — Assessment & Plan Note (Signed)
Rx mobic daily with food.  ES tylenol for breakthrough pain.  RICE discussed.  Topical Aspercreme to the area. Follow-up in 1 week.

## 2015-01-24 ENCOUNTER — Telehealth: Payer: Self-pay | Admitting: Family Medicine

## 2015-01-24 NOTE — Telephone Encounter (Signed)
We discussed situational depression back in 2012.  This is part of her medical record so I cannot remove it.  I did note on her form that her depression was situational and is well controlled off meds.

## 2015-01-24 NOTE — Telephone Encounter (Signed)
Form retrieved and placed at front desk for pick up.

## 2015-01-24 NOTE — Telephone Encounter (Signed)
Caller name:Celester Percell Miller Relationship to patient: Can be reached:[phone] Pharmacy:  Reason for call: Wants to apologize for hanging up, will be by to get her form

## 2015-01-24 NOTE — Telephone Encounter (Signed)
Notified pt of Provider response. She became upset and states she is not going to submit this form to the state because she feels like it makes her sound like she has a "mental problem" and she doesn't think they would allow her to become a foster parent. I restated that form states "depression was situational and she was not requiring medication" indicating this is not a current problem. Pt states she doesn't want the form and hung up. Copy was already scanned to the chart so original has been shredded.

## 2015-01-24 NOTE — Telephone Encounter (Signed)
See phone note. If she wants to come in for weight and bp recheck we can do this.

## 2015-03-02 ENCOUNTER — Other Ambulatory Visit (HOSPITAL_COMMUNITY)
Admission: RE | Admit: 2015-03-02 | Discharge: 2015-03-02 | Disposition: A | Discharge status: Home / Self Care | Payer: 59 | Source: Ambulatory Visit | Attending: Family Medicine | Admitting: Family Medicine

## 2015-03-02 ENCOUNTER — Ambulatory Visit (INDEPENDENT_AMBULATORY_CARE_PROVIDER_SITE_OTHER): Payer: 59 | Admitting: Family Medicine

## 2015-03-02 ENCOUNTER — Encounter: Payer: Self-pay | Admitting: Family Medicine

## 2015-03-02 VITALS — BP 110/74 | HR 70 | Temp 98.6°F | Ht 62.0 in | Wt 198.4 lb

## 2015-03-02 DIAGNOSIS — N760 Acute vaginitis: Secondary | ICD-10-CM | POA: Insufficient documentation

## 2015-03-02 DIAGNOSIS — Z113 Encounter for screening for infections with a predominantly sexual mode of transmission: Secondary | ICD-10-CM | POA: Diagnosis not present

## 2015-03-02 MED ORDER — FLUCONAZOLE 150 MG PO TABS: ORAL_TABLET | ORAL | Status: DC

## 2015-03-02 NOTE — Addendum Note (Signed)
Addended by: Ewing Schlein on: 03/02/2015 05:00 PM   Modules accepted: Orders

## 2015-03-02 NOTE — Progress Notes (Signed)
Patient ID: Melissa [name], female    DOB: 1991-03-09  Age: 24 y.o. MRN: [id number]    Subjective:  Subjective HPI Melissa [name] presents for vaginal d/c ---gray -- with odor.    Review of Systems  Constitutional: Negative for fever, chills, activity change and appetite change.  Gastrointestinal: Negative for abdominal pain and abdominal distention.  Genitourinary: Positive for vaginal discharge. Negative for dysuria, urgency, frequency, hematuria, flank pain, difficulty urinating, genital sores, vaginal pain, menstrual problem, pelvic pain and dyspareunia.  Musculoskeletal: Negative for back pain.    History Past Medical History  Diagnosis Date  . Asthma     activity induced  . Abnormal Pap smear of cervix 07/11/2011  . Polycystic ovarian syndrome   . Irregular menses   . Acid reflux   . Anxiety     She has past surgical history that includes No past surgeries.   Her family history includes Breast cancer in an other family member; Colon cancer in an other family member; Colon polyps in her maternal grandmother and mother; Diabetes in her maternal grandmother; Emphysema in her maternal grandfather; Gallbladder disease in her mother; Heart disease in her paternal grandfather; Hypertension in her father; Irritable bowel syndrome in her sister; Other in her father; Stomach cancer in her maternal grandmother.She reports that she has never smoked. She has never used smokeless tobacco. She reports that she does not drink alcohol or use illicit drugs.  No current outpatient prescriptions on file prior to visit.   No current facility-administered medications on file prior to visit.     Objective:  Objective Physical Exam  Abdominal: Soft. She exhibits no distension. There is no tenderness. There is no rebound, no guarding and no CVA tenderness.  Genitourinary: Pelvic exam was performed with patient supine. There is no rash, tenderness, lesion or injury on the right labia. There is no  rash, tenderness, lesion or injury on the left labia. Cervix exhibits discharge. Cervix exhibits no motion tenderness and no friability. No erythema or tenderness in the vagina. Vaginal discharge found.  Psychiatric: She has a normal mood and affect. Her behavior is normal. Thought content normal.   BP 110/74 mmHg  Pulse 70  Temp(Src) 98.6 F (37 C) (Oral)  Ht 5\' 2"  (1.575 m)  Wt 198 lb 6.4 oz (89.994 kg)  BMI 36.28 kg/m2  SpO2 98%  LMP  (LMP Unknown) Wt Readings from Last 3 Encounters:  03/02/15 198 lb 6.4 oz (89.994 kg)  01/23/15 203 lb (92.08 kg)  09/12/14 216 lb 3.2 oz (98.068 kg)     Lab Results  Component Value Date   WBC 5.8 11/18/2013   HGB 13.1 11/18/2013   HCT 40.3 11/18/2013   PLT 353 11/18/2013   GLUCOSE 88 09/12/2014   ALT 26 11/15/2013   AST 20 11/15/2013   NA 137 09/12/2014   K 3.7 09/12/2014   CL 106 09/12/2014   CREATININE 0.6 09/12/2014   BUN 14 09/12/2014   CO2 26 09/12/2014   TSH 1.973 07/11/2011   HGBA1C 6.0 09/12/2014    Nm Hepato W/eject Fract  01/27/2014   CLINICAL DATA:  Abdominal pain.  Nausea and vomiting.  EXAM: NUCLEAR MEDICINE HEPATOBILIARY IMAGING WITH GALLBLADDER EF  TECHNIQUE: Sequential images of the abdomen were obtained out to 60 minutes following intravenous administration of radiopharmaceutical. After slow intravenous infusion of 2.0 micrograms Cholecystokinin, gallbladder ejection fraction was determined.  RADIOPHARMACEUTICALS:  5.0 [place] NW-29F Choletec  COMPARISON:  None.  FINDINGS: Prompt radiopharmaceutical uptake by the liver is  seen. Liver is normal in appearance. Breast attenuation artifact noted.  Prompt biliary excretion of activity is seen. Biliary activity reaches small bowel on the 15 min image. Gallbladder activity is initially seen on the 20 min image.  During infusion of CCK, gallbladder ejection fraction reaches 93%. At 30 min, normal ejection fraction is greater than 30%.  The patient did not experience symptoms  during CCK infusion.  IMPRESSION: Normal hepatobiliary scan.  Normal gallbladder ejection fraction of 93%.   Electronically Signed   By: Earle Gell M.D.   On: 01/27/2014 10:00     Assessment & Plan:  Plan I have discontinued Melissa [name]'s omeprazole, OVER THE COUNTER MEDICATION, promethazine, ranitidine, meloxicam, and cyclobenzaprine. I am also having her start on fluconazole.  Meds ordered this encounter  Medications  . fluconazole (DIFLUCAN) 150 MG tablet    Sig: 1 po qd x1, may repeat in 3 days prn    Dispense:  2 tablet    Refill:  0    Problem List Items Addressed This Visit    None    Visit Diagnoses    Vaginitis and vulvovaginitis    -  Primary    Relevant Medications    fluconazole (DIFLUCAN) 150 MG tablet    Other Relevant Orders    Urine cytology ancillary only    POCT urinalysis dipstick    POCT urine pregnancy    WET PREP BY MOLECULAR PROBE       Follow-up: Return if symptoms worsen or fail to improve.  Garnet Koyanagi, DO

## 2015-03-02 NOTE — Patient Instructions (Signed)

## 2015-03-02 NOTE — Progress Notes (Signed)
Pre visit review using our clinic review tool, if applicable. No additional management support is needed unless otherwise documented below in the visit note. 

## 2015-03-06 ENCOUNTER — Telehealth: Payer: Self-pay | Admitting: Family Medicine

## 2015-03-06 MED ORDER — PROMETHAZINE HCL 25 MG PO TABS: 25.0000 mg | ORAL_TABLET | Freq: Two times a day (BID) | ORAL | Status: DC | PRN

## 2015-03-06 NOTE — Telephone Encounter (Signed)
Called the patient needs more specifics as there are [facility] in [place]. [place].

## 2015-03-06 NOTE — Telephone Encounter (Signed)
Caller name: Lovina Relationship to patient: SELF Can be reached: [phone] Pharmacy: [place]   Reason for call: [place] [phone]

## 2015-03-06 NOTE — Telephone Encounter (Signed)
Pt called back checking on the status of medication request.  Pt states she can not schedule OV she is out of town. Pt is feeling nausea and states a nausea medication has been prescribed in the past. Advised pt to check with pharmacy regarding OTC medication pt was not happy. Please advise

## 2015-03-06 NOTE — Telephone Encounter (Signed)
Advise on this please.  Looks like you have not seen her since 10/2013 and medication is not on her current list.

## 2015-03-06 NOTE — Telephone Encounter (Signed)
Patient called back and wanted to have prescription sent to [place] as she is now on her way home

## 2015-03-06 NOTE — Telephone Encounter (Signed)
Can have an rx for just 10 tabs of promethazine 25 mg tabs 1 tab po bid prn nausea. If she still feels ill will need to be seen

## 2015-03-08 ENCOUNTER — Telehealth: Payer: Self-pay | Admitting: Family Medicine

## 2015-03-08 DIAGNOSIS — N760 Acute vaginitis: Secondary | ICD-10-CM

## 2015-03-08 LAB — URINE CYTOLOGY ANCILLARY ONLY
CHLAMYDIA, DNA PROBE: NEGATIVE
Neisseria Gonorrhea: NEGATIVE
Trichomonas: NEGATIVE

## 2015-03-08 NOTE — Telephone Encounter (Signed)
Pt called b/c fluconazole (DIFLUCAN) 150 MG tablet was sent to the wrong pharmacy. [facility] [address]

## 2015-03-09 LAB — URINE CYTOLOGY ANCILLARY ONLY
BACTERIAL VAGINITIS: NEGATIVE
Candida vaginitis: NEGATIVE

## 2015-03-09 MED ORDER — FLUCONAZOLE 150 MG PO TABS: ORAL_TABLET | ORAL | Status: DC

## 2015-03-09 NOTE — Telephone Encounter (Signed)
Resent to correct pharmacy.

## 2015-03-28 ENCOUNTER — Telehealth: Payer: Self-pay | Admitting: Family Medicine

## 2015-03-28 NOTE — Telephone Encounter (Signed)
Caller name:Helyn Relationship to patient:self Can be reached:[phone] Pharmacy:  Reason for call:came to see dr  Etter Sjogren for a yeast infection a week ago finished with the meds but doesn't feel any better

## 2015-03-29 NOTE — Telephone Encounter (Signed)
Called left detailed message of information PCP is in need of before being able to treat

## 2015-03-29 NOTE — Telephone Encounter (Signed)
Need to know more symptoms so I can advise if she needs to come in or can treat again. Fever, abdominal pain, discharge, sores, dysuria?

## 2015-03-29 NOTE — Telephone Encounter (Signed)
Called the patient left message to call back [phone]).

## 2015-03-30 NOTE — Telephone Encounter (Signed)
Patient returning your call best # [phone]

## 2015-03-30 NOTE — Telephone Encounter (Signed)
Patient denies fevers, abdominal pain, or any urinary problems.  She states that she does have one sore because she has herpes, but it is itching very badly and she is getting no relief.  Her main concerns is the itching.

## 2015-03-30 NOTE — Telephone Encounter (Signed)
OK so if the lesion is not open or painful she is just having itching from the healing process so she should cleanse the area with [place] as much as she needs that stops itching, then send her in some Triamcinolone cream 0.025% to apply a small amount twice daily prn itching. Disp 15 gm tube. Warn her there is a slight chance this can make her herpes lesion flare again but it is unlikely, if it causes the sore to enlarge or get painful then stop the cream but continue the witch hazel. And then call back

## 2015-04-02 NOTE — Telephone Encounter (Signed)
Called patient at # [phone] and left message to return call when available.

## 2015-04-04 NOTE — Telephone Encounter (Signed)
Called patient at [phone] [facility]) *Preferred* and left message to return call when available.

## 2015-04-05 NOTE — Telephone Encounter (Signed)
Called patient and she stated that she has called OBGYN and scheduled appointment for follow-up.

## 2015-11-07 ENCOUNTER — Other Ambulatory Visit: Payer: Self-pay | Admitting: Gastroenterology

## 2016-01-30 ENCOUNTER — Encounter (HOSPITAL_COMMUNITY): Payer: Self-pay

## 2016-01-30 ENCOUNTER — Emergency Department (HOSPITAL_COMMUNITY)
Admission: EM | Admit: 2016-01-30 | Discharge: 2016-01-30 | Disposition: A | Discharge status: Home / Self Care | Payer: Commercial Managed Care - HMO | Attending: Emergency Medicine | Admitting: Emergency Medicine

## 2016-01-30 DIAGNOSIS — Y999 Unspecified external cause status: Secondary | ICD-10-CM | POA: Insufficient documentation

## 2016-01-30 DIAGNOSIS — Y939 Activity, unspecified: Secondary | ICD-10-CM | POA: Diagnosis not present

## 2016-01-30 DIAGNOSIS — M545 Low back pain: Secondary | ICD-10-CM | POA: Diagnosis present

## 2016-01-30 DIAGNOSIS — J45909 Unspecified asthma, uncomplicated: Secondary | ICD-10-CM | POA: Diagnosis not present

## 2016-01-30 DIAGNOSIS — M546 Pain in thoracic spine: Secondary | ICD-10-CM | POA: Insufficient documentation

## 2016-01-30 DIAGNOSIS — Y92410 Unspecified street and highway as the place of occurrence of the external cause: Secondary | ICD-10-CM | POA: Insufficient documentation

## 2016-01-30 DIAGNOSIS — M549 Dorsalgia, unspecified: Secondary | ICD-10-CM
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
## 2016-01-30 NOTE — Discharge Instructions (Signed)
Take 4 over the counter ibuprofen tablets 3 times a day or 2 over-the-counter naproxen tablets twice a day for pain. ° °Back Pain, Adult °Back pain is very common. The pain often gets better over time. The cause of back pain is usually not dangerous. Most people can learn to manage their back pain on their own.  °HOME CARE  °Watch your back pain for any changes. The following actions may help to lessen any pain you are feeling: °· Stay active. Start with short walks on flat ground if you can. Try to walk farther each day. °· Exercise regularly as told by your doctor. Exercise helps your back heal faster. It also helps avoid future injury by keeping your muscles strong and flexible. °· Do not sit, drive, or stand in one place for more than 30 minutes. °· Do not stay in bed. Resting more than 1-2 days can slow down your recovery. °· Be careful when you bend or lift an object. Use good form when lifting: °¨ Bend at your knees. °¨ Keep the object close to your body. °¨ Do not twist. °· Sleep on a firm mattress. Lie on your side, and bend your knees. If you lie on your back, put a pillow under your knees. °· Take medicines only as told by your doctor. °· Put ice on the injured area. °¨ Put ice in a plastic bag. °¨ Place a towel between your skin and the bag. °¨ Leave the ice on for 20 minutes, 2-3 times a day for the first 2-3 days. After that, you can switch between ice and heat packs. °· Avoid feeling anxious or stressed. Find good ways to deal with stress, such as exercise. °· Maintain a healthy weight. Extra weight puts stress on your back. °GET HELP IF:  °· You have pain that does not go away with rest or medicine. °· You have worsening pain that goes down into your legs or buttocks. °· You have pain that does not get better in one week. °· You have pain at night. °· You lose weight. °· You have a fever or chills. °GET HELP RIGHT AWAY IF:  °· You cannot control when you poop (bowel movement) or pee  (urinate). °· Your arms or legs feel weak. °· Your arms or legs lose feeling (numbness). °· You feel sick to your stomach (nauseous) or throw up (vomit). °· You have belly (abdominal) pain. °· You feel like you may pass out (faint). °  °This information is not intended to replace advice given to you by your health care provider. Make sure you discuss any questions you have with your health care provider. °  °Document Released: 02/04/2008 Document Revised: 09/08/2014 Document Reviewed: 12/20/2013 °Elsevier Interactive Patient Education ©2016 [employer] ° °

## 2016-01-31 ENCOUNTER — Ambulatory Visit (INDEPENDENT_AMBULATORY_CARE_PROVIDER_SITE_OTHER): Payer: Commercial Managed Care - HMO | Admitting: Family Medicine

## 2016-01-31 ENCOUNTER — Encounter: Payer: Self-pay | Admitting: Family Medicine

## 2016-01-31 DIAGNOSIS — M791 Myalgia, unspecified site: Secondary | ICD-10-CM

## 2016-01-31 MED ORDER — CYCLOBENZAPRINE HCL 10 MG PO TABS: 10.0000 mg | ORAL_TABLET | Freq: Two times a day (BID) | ORAL | Status: DC | PRN

## 2016-01-31 NOTE — Progress Notes (Signed)
Pre visit review using our clinic review tool, if applicable. No additional management support is needed unless otherwise documented below in the visit note. 
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
## 2016-02-01 ENCOUNTER — Encounter: Payer: Self-pay | Admitting: Family Medicine

## 2016-02-01 MED ORDER — TRAMADOL HCL 50 MG PO TABS: 50.0000 mg | ORAL_TABLET | Freq: Three times a day (TID) | ORAL | Status: DC | PRN

## 2016-02-06 ENCOUNTER — Other Ambulatory Visit: Payer: Self-pay | Admitting: Family Medicine

## 2016-02-06 ENCOUNTER — Ambulatory Visit (INDEPENDENT_AMBULATORY_CARE_PROVIDER_SITE_OTHER): Payer: Commercial Managed Care - HMO | Admitting: Family Medicine

## 2016-02-06 ENCOUNTER — Encounter: Payer: Self-pay | Admitting: Family Medicine

## 2016-02-06 ENCOUNTER — Ambulatory Visit (HOSPITAL_BASED_OUTPATIENT_CLINIC_OR_DEPARTMENT_OTHER)
Admission: RE | Admit: 2016-02-06 | Discharge: 2016-02-06 | Disposition: A | Discharge status: Home / Self Care | Payer: No Typology Code available for payment source | Source: Ambulatory Visit | Attending: Family Medicine | Admitting: Family Medicine

## 2016-02-06 VITALS — BP 117/55 | HR 96 | Temp 98.8°F | Ht 62.0 in | Wt 219.0 lb

## 2016-02-06 DIAGNOSIS — M546 Pain in thoracic spine: Secondary | ICD-10-CM

## 2016-02-06 DIAGNOSIS — M542 Cervicalgia: Secondary | ICD-10-CM

## 2016-02-06 MED ORDER — MELOXICAM 15 MG PO TABS: 15.0000 mg | ORAL_TABLET | Freq: Every day | ORAL | Status: DC

## 2016-02-06 MED ORDER — HYDROCODONE-ACETAMINOPHEN 5-325 MG PO TABS: 1.0000 | ORAL_TABLET | Freq: Three times a day (TID) | ORAL | Status: DC | PRN

## 2016-02-06 NOTE — Progress Notes (Signed)
Pre visit review using our clinic review tool, if applicable. No additional management support is needed unless otherwise documented below in the visit note. 

## 2016-02-06 NOTE — Addendum Note (Signed)
Addended by: Lamar Blinks C on: 02/06/2016 08:51 PM   Modules accepted: Orders

## 2016-02-06 NOTE — Patient Instructions (Addendum)
Please go to the ground floor for your x-rays (imaging department). After this you can go to work and I will contact you with your reports this evening I sent in an rx for mobic 15 mg that you can use daily as needed for pain Also you can use hydrocodone for a few days if needed for pain.  Remember this will make you sleepy- do not combine with tramadol or the muscle relaxer
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
## 2016-02-07 ENCOUNTER — Other Ambulatory Visit: Payer: Self-pay | Admitting: Emergency Medicine

## 2016-02-07 MED ORDER — TRAMADOL HCL 50 MG PO TABS: 50.0000 mg | ORAL_TABLET | Freq: Three times a day (TID) | ORAL | Status: DC | PRN

## 2016-02-07 NOTE — Telephone Encounter (Signed)
Received refill request for Tramadol from pt. Will it be ok to refill? Please advise. Thanks

## 2016-03-22 ENCOUNTER — Other Ambulatory Visit: Payer: Self-pay | Admitting: Gastroenterology

## 2016-03-24 ENCOUNTER — Encounter: Payer: Self-pay | Admitting: Family Medicine

## 2016-03-25 MED ORDER — RANITIDINE HCL 300 MG PO TABS: 300.0000 mg | ORAL_TABLET | Freq: Every day | ORAL | 5 refills | Status: DC

## 2016-05-15 ENCOUNTER — Encounter: Payer: Self-pay | Admitting: Family Medicine

## 2016-05-15 ENCOUNTER — Ambulatory Visit (INDEPENDENT_AMBULATORY_CARE_PROVIDER_SITE_OTHER): Payer: Commercial Managed Care - HMO | Admitting: Family Medicine

## 2016-05-15 VITALS — BP 100/70 | HR 76 | Temp 97.9°F | Ht 62.0 in

## 2016-05-15 DIAGNOSIS — R21 Rash and other nonspecific skin eruption: Secondary | ICD-10-CM | POA: Diagnosis not present

## 2016-05-15 DIAGNOSIS — R8290 Unspecified abnormal findings in urine: Secondary | ICD-10-CM

## 2016-05-15 LAB — POC URINALSYSI DIPSTICK (AUTOMATED)
BILIRUBIN UA: NEGATIVE
GLUCOSE UA: NEGATIVE
KETONES UA: NEGATIVE
LEUKOCYTES UA: NEGATIVE
NITRITE UA: NEGATIVE
Protein, UA: NEGATIVE
Spec Grav, UA: >=1.03
Urobilinogen, UA: 0.2
pH, UA: 6

## 2016-05-15 MED ORDER — FLUOCINONIDE 0.05 % EX OINT: 1.0000 "application " | TOPICAL_OINTMENT | Freq: Two times a day (BID) | CUTANEOUS | 0 refills | Status: DC

## 2016-05-15 NOTE — Progress Notes (Signed)
Dr. Frederico Hamman T. Charnay Nazario, MD, [facility] [place], [address] Phone: [phone] Fax: [phone]  05/15/2016  Patient: Melissa [name], MRN: [id number], DOB: Mar 11, 1991, 25 y.o.  Primary Physician:  Penni Homans, MD   Chief Complaint  Patient presents with  . Rash    Underarm Left Side  . Odor to Urine   Subjective:   Melissa [name] is a 25 y.o. very pleasant female patient who presents with the following:  25 yo presents with 2 primary complaints.  Rash, left axilla.   This is been present for 2 weeks.  Initially she had one vesicle, and then developed a small open area.  She placed some Bactroban on this.  Now she has an area that is approximately 3-1/2 cm across by  2 cm.  This is itchy and character.  She is placed multiple topical ointments on this at this time as detailed below.   None have alleviated her symptoms.  Tinactin burned  Mupiricin Triamcinalone And now Lidex.  One time dried.   Odor with urination. No dysuria or  Polyuria or urgency.  She is currently menstruating.  Past Medical History, Surgical History, Social History, Family History, Problem List, Medications, and Allergies have been reviewed and updated if relevant.  Patient Active Problem List   Diagnosis Date Noted  . Morbid obesity ([place]) 01/31/2016  . Hip abductor tendinitis 01/23/2015  . Acanthosis nigricans 09/12/2014  . Snoring 09/12/2014  . Oligo-ovulation 07/04/2014  . Fall from balcony 03/14/2014  . Nausea with vomiting 11/21/2013  . Sinusitis 11/21/2013  . Nevus 02/11/2013  . GERD (gastroesophageal reflux disease) 02/11/2013  . Epigastric pain 07/11/2011  . PCOS (polycystic ovarian syndrome) 07/11/2011  . Abnormal Pap smear of cervix 07/11/2011  . Depression 07/11/2011  . Asthma 07/11/2011  . Weight gain 07/11/2011    Past Medical History:  Diagnosis Date  . Abnormal Pap smear of cervix 07/11/2011  . Acid reflux   .  Anxiety   . Asthma    activity induced  . Irregular menses   . Polycystic ovarian syndrome     Past Surgical History:  Procedure Laterality Date  . NO PAST SURGERIES      Social History   Social History  . Marital status: Married    Spouse name: N/A  . Number of children: 0  . Years of education: N/A   Occupational History  . student Building surveyor    Social History Main Topics  . Smoking status: Never Smoker  . Smokeless tobacco: Never Used  . Alcohol use No  . Drug use: No  . Sexual activity: Yes    Birth control/ protection: None   Other Topics Concern  . Not on file   Social History Narrative   Caffeine use:  1 can soda daily   Regular exercise:  Just started   2 sisters.     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   Married   No children.    Denies drugs, smoking, or ETOH.          Family History  Problem Relation Age of Onset  . Gallbladder disease Mother   . Hypertension Father   . Other Father     Hepatitis A  . Stomach cancer Maternal Grandmother   . Emphysema Maternal Grandfather   . Heart disease Paternal Grandfather   .  Breast cancer      mat great aunts x 2  . Colon cancer      mat great uncles x 2  . Diabetes Maternal Grandmother   . Irritable bowel syndrome Sister   . Colon polyps Mother   . Colon polyps Maternal Grandmother     Allergies  Allergen Reactions  . Benadryl [Diphenhydramine Hcl] Other (See Comments)    Heart races    Medication list reviewed and updated in full in [place].   GEN: No acute illnesses, no fevers, chills. GI: No n/v/d, eating normally Pulm: No SOB Interactive and getting along well at home.  Otherwise, ROS is as per the HPI.  Objective:   BP 100/70   Pulse 76   Temp 97.9 F (36.6 C) (Oral)   Ht 5\' 2"  (1.575 m)   LMP 05/09/2016   GEN: WDWN, NAD, Non-toxic, A & O x 3 HEENT: Atraumatic, Normocephalic.  Neck supple. No masses, No LAD. Ears and Nose: No external deformity. EXTR: No c/c/e NEURO Normal gait.  PSYCH: Normally interactive. Conversant. Not depressed or anxious appearing.  Calm demeanor.   SKIN: there is a flat  Mildly  Elevated area in the left axilla approximately 3.5 x 2 cm across.  There is no vesicle.  There is no central clearing. This portion of the physical examination was chaperoned by Hedy Camara, CMA.   Laboratory and Imaging Data: Results for orders placed or performed in visit on 05/15/16  POCT Urinalysis Dipstick (Automated)  Result Value Ref Range   Color, UA yellow    Clarity, UA clear    Glucose, UA negative    Bilirubin, UA negative    Ketones, UA negative    Spec Grav, UA >=1.030    Blood, UA large    pH, UA 6.0    Protein, UA negative    Urobilinogen, UA 0.2    Nitrite, UA negative    Leukocytes, UA Negative Negative     Assessment and Plan:   Rash and nonspecific skin eruption  Abnormal urine odor - Plan: POCT Urinalysis Dipstick (Automated), Urine culture  The patient's urine appears clear.  Certainly dehydrated.  Specific gravity is 1.030.  Reassured the patient.  We will obtain a urine culture  To be very clear that this is not infection.  Multiple medications have been used on this rash.   This may all simply be irritation an allergic response secondary to multiple medication use, and is very difficult to tell etiology secondary to this.  To me this appears to be more allergic in character.  I sent the patient and some Lidex initially.   She sent me a internal mychart message  Within 3-4 hours of her office visit requesting some antifungal medication.  This is not entirely unreasonable.  Trial 3 weeks.  Follow-up with primary care provider or dermatology if symptoms persist.  New Prescriptions   BUTENAFINE HCL 1 % CREAM    Apply topically bid   FLUOCINONIDE OINTMENT (LIDEX) 0.05 %    Apply 1 application topically 2 (two) times daily.    Orders Placed This Encounter  Procedures  . Urine culture  . POCT Urinalysis Dipstick (Automated)    Signed,  Byrd Rushlow T. Usbaldo Pannone, MD   Patient's Medications  New Prescriptions   BUTENAFINE HCL 1 % CREAM    Apply topically bid   FLUOCINONIDE OINTMENT (LIDEX) 0.05 %    Apply 1 application topically 2 (two) times daily.  Previous Medications  RANITIDINE (ZANTAC) 300 MG TABLET    Take 1 tablet (300 mg total) by mouth at bedtime.  Modified Medications   No medications on file  Discontinued Medications   CYCLOBENZAPRINE (FLEXERIL) 10 MG TABLET    Take 1 tablet (10 mg total) by mouth 2 (two) times daily as needed for muscle spasms.   HYDROCODONE-ACETAMINOPHEN (NORCO/VICODIN) 5-325 MG TABLET    Take 1 tablet by mouth every 8 (eight) hours as needed.   IBUPROFEN (ADVIL,MOTRIN) 200 MG TABLET    Take 3 tablets as needed for pain   MELOXICAM (MOBIC) 15 MG TABLET    Take 1 tablet (15 mg total) by mouth daily.   ORSYTHIA 0.1-20 MG-MCG TABLET    Take 1 tablet by mouth daily.   TRAMADOL (ULTRAM) 50 MG TABLET    Take 1 tablet (50 mg total) by mouth every 8 (eight) hours as needed.

## 2016-05-15 NOTE — Progress Notes (Signed)
Pre visit review using our clinic review tool, if applicable. No additional management support is needed unless otherwise documented below in the visit note. 

## 2016-05-16 LAB — URINE CULTURE

## 2016-05-16 MED ORDER — BUTENAFINE HCL 1 % EX CREA: TOPICAL_CREAM | CUTANEOUS | 1 refills | Status: DC

## 2016-05-16 NOTE — Telephone Encounter (Signed)
Please call:  It would be accurate to say that I do not know exactly what your rash is, because I do not.   The use of several types of ointments distorts the picture, and while I think it is more likely an allergic response, I think it is very reasonable to do a trial of an antifungal medication for a few weeks.   We will send some to your pharmacy.

## 2016-06-10 ENCOUNTER — Telehealth: Payer: Commercial Managed Care - HMO | Admitting: Family

## 2016-06-10 DIAGNOSIS — N898 Other specified noninflammatory disorders of vagina: Secondary | ICD-10-CM

## 2016-06-10 MED ORDER — TERCONAZOLE 0.8 % VA CREA: 1.0000 | TOPICAL_CREAM | Freq: Every day | VAGINAL | 0 refills | Status: DC

## 2016-06-10 MED ORDER — FLUCONAZOLE 150 MG PO TABS: 150.0000 mg | ORAL_TABLET | Freq: Once | ORAL | 0 refills | Status: DC

## 2016-06-10 NOTE — Progress Notes (Signed)
We are sorry that you are not feeling well. Here is how we plan to help! Based on what you shared with me it looks like you: May have a yeast vaginosis  Vaginosis is an inflammation of the vagina that can result in discharge, itching and pain. The cause is usually a change in the normal balance of vaginal bacteria or an infection. Vaginosis can also result from reduced estrogen levels after menopause.  The most common causes of vaginosis are:   Bacterial vaginosis which results from an overgrowth of one on several organisms that are normally present in your vagina.   Yeast infections which are caused by a naturally occurring fungus called candida.   Vaginal atrophy (atrophic vaginosis) which results from the thinning of the vagina from reduced estrogen levels after menopause.   Trichomoniasis which is caused by a parasite and is commonly transmitted by sexual intercourse.  Factors that increase your risk of developing vaginosis include: Marland Kitchen Medications, such as antibiotics and steroids . Uncontrolled diabetes . Use of hygiene products such as bubble bath, vaginal spray or vaginal deodorant . Douching . Wearing damp or tight-fitting clothing . Using an intrauterine device (IUD) for birth control . Hormonal changes, such as those associated with pregnancy, birth control pills or menopause . Sexual activity . Having a sexually transmitted infection  Your treatment plan is A single Diflucan (fluconazole) 150mg  tablet once.  I have electronically sent this prescription into the pharmacy that you have chosen. And Terazol cream  Be sure to take all of the medication as directed. Stop taking any medication if you develop a rash, tongue swelling or shortness of breath. Mothers who are breast feeding should consider pumping and discarding their breast milk while on these antibiotics. However, there is no consensus that infant exposure at these doses would be harmful.  Remember that medication creams  can weaken latex condoms. Marland Kitchen   HOME CARE:  Good hygiene may prevent some types of vaginosis from recurring and may relieve some symptoms:  . Avoid baths, hot tubs and whirlpool spas. Rinse soap from your outer genital area after a shower, and dry the area well to prevent irritation. Don't use scented or harsh soaps, such as those with deodorant or antibacterial action. Marland Kitchen Avoid irritants. These include scented tampons and pads. . Wipe from front to back after using the toilet. Doing so avoids spreading fecal bacteria to your vagina.  Other things that may help prevent vaginosis include:  Marland Kitchen Don't douche. Your vagina doesn't require cleansing other than normal bathing. Repetitive douching disrupts the normal organisms that reside in the vagina and can actually increase your risk of vaginal infection. Douching won't clear up a vaginal infection. . Use a latex condom. Both female and female latex condoms may help you avoid infections spread by sexual contact. . Wear cotton underwear. Also wear pantyhose with a cotton crotch. If you feel comfortable without it, skip wearing underwear to bed. Yeast thrives in [employer] Your symptoms should improve in the next day or two.  GET HELP RIGHT AWAY IF:  . You have pain in your lower abdomen ( pelvic area or over your ovaries) . You develop nausea or vomiting . You develop a fever . Your discharge changes or worsens . You have persistent pain with intercourse . You develop shortness of breath, a rapid pulse, or you faint.  These symptoms could be signs of problems or infections that need to be evaluated by a medical provider now.  MAKE SURE  YOU    Understand these instructions.  Will watch your condition.  Will get help right away if you are not doing well or get worse.  Your e-visit answers were reviewed by a board certified advanced clinical practitioner to complete your personal care plan. Depending upon the condition, your plan  could have included both over the counter or prescription medications. Please review your pharmacy choice to make sure that you have choses a pharmacy that is open for you to pick up any needed prescription, Your safety is important to [place]. If you have drug allergies check your prescription carefully.   You can use MyChart to ask questions about today's visit, request a non-urgent call back, or ask for a work or school excuse for 24 hours related to this e-Visit. If it has been greater than 24 hours you will need to follow up with your provider, or enter a new e-Visit to address those concerns. You will get a MyChart message within the next two days asking about your experience. I hope that your e-visit has been valuable and will speed your recovery.

## 2016-06-11 MED ORDER — TERCONAZOLE 0.8 % VA CREA: 1.0000 | TOPICAL_CREAM | Freq: Every day | VAGINAL | 0 refills | Status: DC

## 2016-06-11 MED ORDER — FLUCONAZOLE 150 MG PO TABS: 150.0000 mg | ORAL_TABLET | Freq: Once | ORAL | 0 refills | Status: AC

## 2016-06-11 NOTE — Addendum Note (Signed)
Addended by: Evelina Dun A on: 06/11/2016 08:01 PM   Modules accepted: Orders

## 2016-07-07 ENCOUNTER — Encounter: Payer: Self-pay | Admitting: Family Medicine

## 2016-07-23 ENCOUNTER — Ambulatory Visit (INDEPENDENT_AMBULATORY_CARE_PROVIDER_SITE_OTHER): Payer: Commercial Managed Care - HMO | Admitting: Family Medicine

## 2016-07-23 ENCOUNTER — Encounter: Payer: Self-pay | Admitting: Family Medicine

## 2016-07-23 VITALS — BP 109/54 | HR 98 | Temp 99.1°F | Ht 62.0 in | Wt 208.4 lb

## 2016-07-23 DIAGNOSIS — J029 Acute pharyngitis, unspecified: Secondary | ICD-10-CM | POA: Diagnosis not present

## 2016-07-23 DIAGNOSIS — J209 Acute bronchitis, unspecified: Secondary | ICD-10-CM | POA: Diagnosis not present

## 2016-07-23 LAB — POCT RAPID STREP A (OFFICE): Rapid Strep A Screen: NEGATIVE

## 2016-07-23 MED ORDER — BENZONATATE 100 MG PO CAPS: 100.0000 mg | ORAL_CAPSULE | Freq: Three times a day (TID) | ORAL | 0 refills | Status: DC | PRN

## 2016-07-23 MED ORDER — KETOROLAC TROMETHAMINE 30 MG/ML IJ SOLN
30.0000 mg | Freq: Once | INTRAMUSCULAR | Status: AC
  Administered 2016-07-23: 30 mg via INTRAMUSCULAR

## 2016-07-23 NOTE — Progress Notes (Signed)
Pre visit review using our clinic review tool, if applicable. No additional management support is needed unless otherwise documented below in the visit note. 

## 2016-07-23 NOTE — Progress Notes (Signed)
Chief Complaint  Patient presents with  . Cough    Since Monday. States that her cough is dry, non-productive; causing her to experience SOB and a sore throat and LBP. Pt also states that she hasn't had a appetitie since [place] here for URI complaints.  Duration: 2 days  Associated symptoms: dry cough, SOB, ST, LBP, decreased appetite Denies: fever, ear pain/drainage, runny/stuffy nose Treatment to date:  Sick contacts: Yes- daughter has RSV  ROS:  Const: Denies fevers HEENT: As noted in HPI Lungs: +SOB, +cough  Past Medical History:  Diagnosis Date  . Abnormal Pap smear of cervix 07/11/2011  . Acid reflux   . Anxiety   . Asthma    activity induced  . Irregular menses   . Polycystic ovarian syndrome    Family History  Problem Relation Age of Onset  . Gallbladder disease Mother   . Hypertension Father   . Other Father     Hepatitis A  . Stomach cancer Maternal Grandmother   . Emphysema Maternal Grandfather   . Heart disease Paternal Grandfather   . Breast cancer      mat great aunts x 2  . Colon cancer      mat great uncles x 2  . Diabetes Maternal Grandmother   . Irritable bowel syndrome Sister   . Colon polyps Mother   . Colon polyps Maternal Grandmother     BP (!) 109/54 (BP Location: Left Arm, Patient Position: Sitting, Cuff Size: Large)   Pulse 98   Temp 99.1 F (37.3 C) (Oral)   Ht 5\' 2"  (1.575 m)   Wt 208 lb 6.4 oz (94.5 kg)   LMP 07/17/2016   SpO2 99% Comment: RA  BMI 38.12 kg/m  General: Awake, alert, appears stated age HEENT: AT, [place], ears patent b/l and TM's neg, nares patent w/o discharge, pharynx pink and without exudates, MMM Neck: No masses or asymmetry Heart: RRR, no murmurs, no bruits Lungs: CTAB, no accessory muscle use Psych: Age appropriate judgment and insight, normal mood and affect  Acute bronchitis, unspecified organism - Plan: benzonatate (TESSALON) 100 MG capsule  Orders as above. Strep neg. Push fluids and  practice good hand hygiene.  NSAIDs for pain. She is not breast feeding or pregnant. F/u in 1 week if symptoms worsen or fail to improve. Pt voiced understanding and agreement to the plan.  [place], DO 07/23/16 4:20 PM

## 2016-07-28 ENCOUNTER — Other Ambulatory Visit: Payer: Self-pay | Admitting: Family Medicine

## 2016-07-28 DIAGNOSIS — R05 Cough: Secondary | ICD-10-CM

## 2016-07-28 DIAGNOSIS — R059 Cough, unspecified: Secondary | ICD-10-CM

## 2016-07-28 MED ORDER — PROMETHAZINE-DM 6.25-15 MG/5ML PO SYRP: 5.0000 mL | ORAL_SOLUTION | Freq: Four times a day (QID) | ORAL | 0 refills | Status: DC | PRN

## 2016-07-28 NOTE — Telephone Encounter (Signed)
Cough med sent to pharmacy will help with nausea as well--- dr Etter Sjogren

## 2016-07-28 NOTE — Telephone Encounter (Signed)
Dr Etter Sjogren-- Please advise in Dr Irene Limbo absence?

## 2016-09-30 ENCOUNTER — Other Ambulatory Visit: Payer: Self-pay | Admitting: Family Medicine

## 2016-09-30 NOTE — Telephone Encounter (Signed)
Received refill request for ranitidine (ZANTAC) 300 MG tablet. Last office visit 02/06/2016 and last refill 03/25/2016. Sent refill to pharmacy.

## 2017-02-05 ENCOUNTER — Telehealth: Payer: Self-pay | Admitting: *Deleted

## 2017-02-05 NOTE — Telephone Encounter (Signed)
Received Medical Evaluation [place] DSS Form, forwarded to provider/SLS 06/07

## 2017-02-05 NOTE — Telephone Encounter (Signed)
Not sure best way to proceed but since I have not seen her I cannot complete her forms. I will forward to other provider to see what they want to do.unclear if she should stay with practice since she is threatening [place] for so little and being disrespectful to staff

## 2017-02-05 NOTE — Telephone Encounter (Signed)
Patient needs an appointment with PCP. Last seen by PCP in 2015. Form can not be filled out until visit.  Please schedule   PC

## 2017-02-05 NOTE — Telephone Encounter (Signed)
Looking through the patients chart she has been very rude and disrespectful to other staff members in the past.  01/23/15 she was very Rude to [place] in a telephone call. Also today 02/05/17 very rude to [place]. Patient has not been seen since 07/2016 for an acute bronchitis.  Her pcp was never changed to [facility], however she has ot seen Melissa since 09/12/14. Please advise as to how you would like to proceed with this situation.

## 2017-02-05 NOTE — Telephone Encounter (Signed)
lvm advising patient of message below °

## 2017-02-05 NOTE — Telephone Encounter (Signed)
Caller name: Relationship to patient: Self Can be reached: [phone] Pharmacy:  Reason for call: Patient returning a call from message she received. Patient was very rude and disrespectful. She was upset about the message that was left for her stating she needed an appointment. Patient states she transferred from Dr. Charlett Blake as her PCP and she has not seen her in 3 years and transferred to [facility] because she could never see Dr. Charlett Blake. States her last PCP was Debbrah Alar and she don't know why it was changed. States she needs these forms completed ASAP because she has children in her home that will be taken if the forms are not completed ASAP. Patient states she is not going to have her children taken from her because [place] can't decide who her PCP is so she will come to the office and just sit until someone sees her and completes the forms. States Melissa completed the forms in 2016 and she see no reason why she has to come in for a visit to get them completed. States she will call administration at [facility].

## 2017-02-06 NOTE — Telephone Encounter (Signed)
I agree that patient needs to be seen before forms can be completed.

## 2017-02-09 ENCOUNTER — Encounter: Payer: Self-pay | Admitting: Family

## 2017-02-09 ENCOUNTER — Ambulatory Visit (INDEPENDENT_AMBULATORY_CARE_PROVIDER_SITE_OTHER): Payer: Commercial Managed Care - HMO | Admitting: Family

## 2017-02-09 VITALS — BP 111/74 | HR 70 | Temp 98.6°F | Resp 16 | Ht 62.0 in | Wt 197.6 lb

## 2017-02-09 DIAGNOSIS — Z0000 Encounter for general adult medical examination without abnormal findings: Secondary | ICD-10-CM

## 2017-02-09 DIAGNOSIS — Z23 Encounter for immunization: Secondary | ICD-10-CM

## 2017-02-09 LAB — BASIC METABOLIC PANEL
BUN: 14 mg/dL (ref 6–23)
CALCIUM: 9.1 mg/dL (ref 8.4–10.5)
CO2: 25 mEq/L (ref 19–32)
Chloride: 106 mEq/L (ref 96–112)
Creatinine, Ser: 0.55 mg/dL (ref 0.40–1.20)
GFR: 168.85 mL/min (ref >60.00)
GLUCOSE: 95 mg/dL (ref 70–99)
Potassium: 3.9 mEq/L (ref 3.5–5.1)
Sodium: 139 mEq/L (ref 135–145)

## 2017-02-09 LAB — HEPATIC FUNCTION PANEL
ALK PHOS: 55 U/L (ref 39–117)
ALT: 23 U/L (ref 0–35)
AST: 18 U/L (ref 0–37)
Albumin: 4.2 g/dL (ref 3.5–5.2)
BILIRUBIN DIRECT: 0.1 mg/dL (ref 0.0–0.3)
BILIRUBIN TOTAL: 0.2 mg/dL (ref 0.2–1.2)
Total Protein: 7 g/dL (ref 6.0–8.3)

## 2017-02-09 LAB — CBC WITH DIFFERENTIAL/PLATELET
BASOS ABS: 0.1 10*3/uL (ref 0.0–0.1)
Basophils Relative: 1 % (ref 0.0–3.0)
EOS ABS: 0.1 10*3/uL (ref 0.0–0.7)
Eosinophils Relative: 2.1 % (ref 0.0–5.0)
HCT: 38.8 % (ref 36.0–46.0)
HEMOGLOBIN: 12.8 g/dL (ref 12.0–15.0)
LYMPHS ABS: 2.7 10*3/uL (ref 0.7–4.0)
Lymphocytes Relative: 54.8 % — ABNORMAL HIGH (ref 12.0–46.0)
MCHC: 32.9 g/dL (ref 30.0–36.0)
MCV: 80.7 fl (ref 78.0–100.0)
MONO ABS: 0.4 10*3/uL (ref 0.1–1.0)
Monocytes Relative: 7.9 % (ref 3.0–12.0)
NEUTROS PCT: 34.2 % — AB (ref 43.0–77.0)
Neutro Abs: 1.7 10*3/uL (ref 1.4–7.7)
Platelets: 347 10*3/uL (ref 150.0–400.0)
RBC: 4.81 Mil/uL (ref 3.87–5.11)
RDW: 15.8 % — ABNORMAL HIGH (ref 11.5–15.5)
WBC: 5 10*3/uL (ref 4.0–10.5)

## 2017-02-09 LAB — URINALYSIS, ROUTINE W REFLEX MICROSCOPIC
BILIRUBIN URINE: NEGATIVE
Ketones, ur: NEGATIVE
Leukocytes, UA: NEGATIVE
NITRITE: NEGATIVE
PH: 6 (ref 5.0–8.0)
Specific Gravity, Urine: >=1.03 — AB (ref 1.000–1.030)
Total Protein, Urine: NEGATIVE
Urine Glucose: NEGATIVE
Urobilinogen, UA: 0.2 (ref 0.0–1.0)

## 2017-02-09 LAB — TSH: TSH: 1.78 u[IU]/mL (ref 0.35–4.50)

## 2017-02-09 LAB — LIPID PANEL
CHOL/HDL RATIO: 4
Cholesterol: 177 mg/dL (ref 0–200)
HDL: 46 mg/dL (ref >39.00)
LDL CALC: 116 mg/dL — AB (ref 0–99)
NONHDL: 130.99
Triglycerides: 74 mg/dL (ref 0.0–149.0)
VLDL: 14.8 mg/dL (ref 0.0–40.0)

## 2017-02-09 NOTE — Telephone Encounter (Signed)
Pt had CPE today with Melissa and form was completed and given to pt. Copy was sent for scanning.

## 2017-02-09 NOTE — Patient Instructions (Addendum)
Complete lab work Programmer, systems to leaving.  Please complete lab work prior to leaving. Schedule follow up with your GYN for a pap smear. Continue to work on [employer], exercise and weight loss.

## 2017-02-09 NOTE — Progress Notes (Signed)
Subjective:    Patient ID: Melissa [name], female    DOB: 02-Jun-1991, 26 y.o.   MRN: [id number]  HPI  Patient presents today for complete physical.  Immunizations: 6/08 Diet: has been working on [employer] Readings from Last 3 Encounters:  02/09/17 197 lb 9.6 oz (89.6 kg)  07/23/16 208 lb 6.4 oz (94.5 kg)  02/06/16 219 lb (99.3 kg)    Exercise: walks during her lunch breakPap Smear: Mammogram: [facility] to [facility] [place] [facility] last pap 2015 Dental:  Due Vision: up to date   Review of Systems  Constitutional: Negative for unexpected weight change.  HENT: Negative for hearing loss and rhinorrhea.   Eyes: Negative for visual disturbance.  Respiratory: Negative for cough.   Cardiovascular: Negative for leg swelling.  Gastrointestinal: Negative for constipation and diarrhea.  Genitourinary: Negative for dysuria and frequency.  Musculoskeletal: Negative for arthralgias and myalgias.  Neurological: Negative for headaches.  Hematological: Negative for adenopathy.  Psychiatric/Behavioral:       Denies depression/anxiety   Past Medical History:  Diagnosis Date  . Abnormal Pap smear of cervix 07/11/2011  . Acid reflux   . Anxiety   . Asthma    activity induced  . Irregular menses   . Polycystic ovarian syndrome      Social History   Social History  . Marital status: Married    Spouse name: N/A  . Number of children: 0  . Years of education: N/A   Occupational History  . student Building surveyor    Social History Main Topics  . Smoking status: Never Smoker  . Smokeless tobacco: Never Used  . Alcohol use 2.4 - 4.2 oz/week    4 - 7 Cans of beer per week     Comment: 1 beer every other ay  . Drug use: No  . Sexual activity: Yes    Partners: Male    Birth control/ protection: None   Other Topics Concern  . Not on file   Social History Narrative   Caffeine use:  1 can soda daily   Regular exercise:  Just started   2 sisters.     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   Married   No children.    Denies drugs, smoking, or ETOH.          Past Surgical History:  Procedure Laterality Date  . NO PAST SURGERIES      Family History  Problem Relation Age of Onset  . Gallbladder disease Mother   . Colon polyps Mother   . Hypertension Father   . Other Father        Hepatitis A  . Stomach cancer Maternal Grandmother   . Diabetes Maternal Grandmother   . Colon polyps Maternal Grandmother   . Emphysema Maternal Grandfather   . Heart disease Paternal Grandfather   . Breast cancer Unknown        mat great aunts x 2  . Colon cancer Unknown        mat great uncles x 2  . Irritable bowel syndrome Sister     Allergies  Allergen Reactions  . Benadryl [Diphenhydramine Hcl] Other (See Comments)    Heart races    Current Outpatient Prescriptions on File Prior to Visit  Medication Sig Dispense Refill  . ranitidine (ZANTAC) 300 MG tablet TAKE 1 TABLET (300 MG TOTAL) BY MOUTH AT BEDTIME. (Patient taking  differently: Take 300 mg by mouth at bedtime as needed. ) 30 tablet 2   No current facility-administered medications on file prior to visit.     BP 111/74 (BP Location: Right Arm, Cuff Size: Normal)   Pulse 70   Temp 98.6 F (37 C) (Oral)   Resp 16   Ht 5\' 2"  (1.575 m)   Wt 197 lb 9.6 oz (89.6 kg)   LMP 01/26/2017   SpO2 99%   BMI 36.14 kg/m       Objective:   Physical Exam Physical Exam  Constitutional: She is oriented to person, place, and time. She appears well-developed and well-nourished. No distress.  HENT:  Head: Normocephalic and atraumatic.  Right Ear: Tympanic membrane and ear canal normal.  Left Ear: Tympanic membrane and ear canal normal.  Mouth/Throat: Oropharynx is clear and moist.  Eyes: Pupils are equal, round, and reactive to light. No scleral icterus.  Neck: Normal range of motion. No thyromegaly present.  Cardiovascular:  Normal rate and regular rhythm.   No murmur heard. Pulmonary/Chest: Effort normal and breath sounds normal. No respiratory distress. He has no wheezes. She has no rales. She exhibits no tenderness.  Abdominal: Soft. Bowel sounds are normal. She exhibits no distension and no mass. There is no tenderness. There is no rebound and no guarding.  Musculoskeletal: She exhibits no edema.  Lymphadenopathy:    She has no cervical adenopathy.  Neurological: She is alert and oriented to person, place, and time. She has normal patellar reflexes. She exhibits normal muscle tone. Coordination normal.  Skin: Skin is warm and dry.  Psychiatric: She has a normal mood and affect. Her behavior is normal. Judgment and thought content normal.  Breasts: Examined lying Right: Without masses, retractions, discharge or axillary adenopathy.  Left: Without masses, retractions, discharge or axillary adenopathy.  Pelvic: deferred to GYN         Assessment & Plan:    Preventative Care- discussed healthy diet, exercise and weight loss. Tdap today. Obtain routine lab work. Form was filled for foster care.       Assessment & Plan:

## 2017-03-05 ENCOUNTER — Telehealth: Payer: Self-pay | Admitting: Family Medicine

## 2017-03-05 NOTE — Telephone Encounter (Signed)
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 She said that one box wasn't complete when form was completed by PCP.   She would like a call back to be advised and have question answered.   Question states: Do pt have any current or ongoing conditions?   Please call back to verbalize.   CB: [phone]

## 2017-03-05 NOTE — Telephone Encounter (Signed)
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 She said that

## 2017-03-06 NOTE — Telephone Encounter (Signed)
I will fill on Monday when I am back in the office

## 2017-03-06 NOTE — Telephone Encounter (Signed)
Social services called back and they are refaxing over the form as one question was left unanswered. They will be faxing today 03/06/17 to Attn: Debbrah Alar and Dr. Sammuel Cooper saw this patient to complete form, but dr. Charlett Blake is her PCP

## 2017-03-06 NOTE — Telephone Encounter (Signed)
Sharyn Lull from [facility] emailed form due to both her faxes not working, formed received and forwarded to Shirlean Mylar for PCP review, please advise

## 2017-03-06 NOTE — Telephone Encounter (Signed)
Dr. Charlett Blake completed form Faxed back to attn Thurnell Lose at [phone]

## 2017-03-06 NOTE — Telephone Encounter (Signed)
Spoke to Dr. Charlett Blake and she had me copy last OV notes and she will try to complete at lunch today.

## 2017-03-09 NOTE — Telephone Encounter (Signed)
Sharyn Lull called back in. She said that she keep receiving the cover sheet but not the actual fax of form completed. She would like to know if form could be scanned in and emailed?    Her email is: [email or web address]

## 2017-03-09 NOTE — Telephone Encounter (Signed)
Have refaxed form again this time without cover sheet.--- only the form was faxed

## 2017-03-09 NOTE — Telephone Encounter (Signed)
Paperwork sent back d/t section on Medical Conditions left blank; needs to answer if pt has any Chronic/Ongoing Medical Conditions, attached Snapshot and forwarded to provider with section and note highlighted/SLS 07/09

## 2017-03-09 NOTE — Telephone Encounter (Signed)
Another form came through for completion as previously done and was dated 03/06/17. Form completed by Dr Charlett Blake and I emailed it to the below address.

## 2017-03-13 ENCOUNTER — Other Ambulatory Visit: Payer: Self-pay | Admitting: Family Medicine

## 2017-03-24 ENCOUNTER — Telehealth: Payer: Self-pay | Admitting: Family Medicine

## 2017-03-24 NOTE — Telephone Encounter (Signed)
Relation to ON:GEXB Call back number:[phone]   Reason for call:  Patient would like her PAP before her insurance covers ends August 11th, please advise if another physican can conduct pap

## 2017-03-27 NOTE — Telephone Encounter (Signed)
lvm advising patient of message below °

## 2017-03-27 NOTE — Telephone Encounter (Signed)
Patient has been seeing Melissa she can schedule with her.   PC
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
## 2019-07-21 ENCOUNTER — Other Ambulatory Visit: Payer: Self-pay

## 2019-07-21 DIAGNOSIS — Z20822 Contact with and (suspected) exposure to covid-19: Secondary | ICD-10-CM

## 2019-07-24 LAB — NOVEL CORONAVIRUS, NAA: SARS-CoV-2, NAA: NOT DETECTED

## 2019-09-20 ENCOUNTER — Telehealth: Payer: Commercial Managed Care - HMO | Admitting: Physician Assistant

## 2019-09-20 DIAGNOSIS — R059 Cough, unspecified: Secondary | ICD-10-CM

## 2019-09-20 DIAGNOSIS — R05 Cough: Secondary | ICD-10-CM

## 2019-09-20 DIAGNOSIS — R0981 Nasal congestion: Secondary | ICD-10-CM

## 2019-09-20 MED ORDER — BENZONATATE 100 MG PO CAPS: 100.0000 mg | ORAL_CAPSULE | Freq: Three times a day (TID) | ORAL | 0 refills | Status: DC | PRN

## 2019-09-20 MED ORDER — IPRATROPIUM BROMIDE 0.03 % NA SOLN: 2.0000 | Freq: Three times a day (TID) | NASAL | 0 refills | Status: DC | PRN

## 2019-09-20 MED ORDER — ALBUTEROL SULFATE HFA 108 (90 BASE) MCG/ACT IN AERS: 1.0000 | INHALATION_SPRAY | Freq: Four times a day (QID) | RESPIRATORY_TRACT | 0 refills | Status: DC | PRN

## 2019-09-20 NOTE — Progress Notes (Signed)
We are sorry you are not feeling well.  Here is how we plan to help!  Based on what you have shared with me, it looks like you may have a viral upper respiratory infection.  Upper respiratory infections are caused by a large number of viruses; however, rhinovirus is the most common cause. I would recommend getting re-tested for COVID due to your recent exposure. Please refer to https://www.[facility].org/ for information about testing.   Symptoms vary from person to person, with common symptoms including sore throat, cough, fatigue or lack of energy and feeling of general discomfort.  A low-grade fever of up to 100.4 may present, but is often uncommon.  Symptoms vary however, and are closely related to a person's age or underlying illnesses.  The most common symptoms associated with an upper respiratory infection are nasal discharge or congestion, cough, sneezing, headache and pressure in the ears and face.  These symptoms usually persist for about 3 to 10 days, but can last up to 2 weeks.  It is important to know that upper respiratory infections do not cause serious illness or complications in most cases.    Upper respiratory infections can be transmitted from person to person, with the most common method of transmission being a person's hands.  The virus is able to live on the skin and can infect other persons for up to 2 hours after direct contact.  Also, these can be transmitted when someone coughs or sneezes; thus, it is important to cover the mouth to reduce this risk.  To keep the spread of the illness at [place], good hand hygiene is very important.  This is an infection that is most likely caused by a virus. There are no specific treatments other than to help you with the symptoms until the infection runs its course.  We are sorry you are not feeling well.  Here is how we plan to help!   For nasal congestion, you may use an oral decongestants such as Mucinex D or if you  have glaucoma or high blood pressure use plain Mucinex.  Saline nasal spray or nasal drops can help and can safely be used as often as needed for congestion.  For your congestion, I have prescribed Ipratropium Bromide nasal spray 0.03% two sprays in each nostril 2-3 times a day  If you do not have a history of heart disease, hypertension, diabetes or thyroid disease, prostate/bladder issues or glaucoma, you may also use Sudafed to treat nasal congestion.  It is highly recommended that you consult with a pharmacist or your primary care physician to ensure this medication is safe for you to take.     If you have a cough, you may use cough suppressants such as Delsym and Robitussin.  If you have glaucoma or high blood pressure, you can also use Coricidin HBP.   For cough I have prescribed for you A prescription cough medication called Tessalon Perles 100 mg. You may take 1-2 capsules every 8 hours as needed for cough. I have also sent over a prescription for an albuterol inhaler to be used 1-2 puffs every 4-6 hours as needed for wheezing/shortness of breath. You may find that this will help decrease the cough as well.   If you have a sore or scratchy throat, use a saltwater gargle-  to  teaspoon of salt dissolved in a 4-ounce to 8-ounce glass of warm water.  Gargle the solution for approximately 15-30 seconds and then spit.  It is important not to  swallow the solution.  You can also use throat lozenges/cough drops and Chloraseptic spray to help with throat pain or discomfort.  Warm or cold liquids can also be helpful in relieving throat pain.  For headache, pain or general discomfort, you can use Ibuprofen or Tylenol as directed.  You can take 1 to 2 tablets of Tylenol (350mg -1000mg  depending on the dose) every 6 hours as needed for pain.  Do not exceed 4000 mg of Tylenol daily.  If your pain persists you can take a dose of ibuprofen in between doses of Tylenol.  I usually recommend 400 to 600 mg of  ibuprofen every 6 hours.  Take this with food to avoid upset stomach issues.  Some authorities believe that zinc sprays or the use of Echinacea may shorten the course of your symptoms.   HOME CARE . Only take medications as instructed by your medical team. . Be sure to drink plenty of fluids. Water is fine as well as fruit juices, sodas and electrolyte beverages. You may want to stay away from caffeine or alcohol. If you are nauseated, try taking small sips of liquids. How do you know if you are getting enough fluid? Your urine should be a pale yellow or almost colorless. . Get rest. . Taking a steamy shower or using a humidifier may help nasal congestion and ease sore throat pain. You can place a towel over your head and breathe in the steam from hot water coming from a faucet. . Using a saline nasal spray works much the same way. . Cough drops, hard candies and sore throat lozenges may ease your cough. . Avoid close contacts especially the very young and the elderly . Cover your mouth if you cough or sneeze . Always remember to wash your hands.   GET HELP RIGHT AWAY IF: . You develop worsening fever. . If your symptoms do not improve within 10 days . You develop yellow or green discharge from your nose over 3 days. . You have coughing fits . You develop a severe head ache or visual changes. . You develop shortness of breath, difficulty breathing or start having chest pain . Your symptoms persist after you have completed your treatment plan  MAKE SURE YOU   Understand these instructions.  Will watch your condition.  Will get help right away if you are not doing well or get worse.  Your e-visit answers were reviewed by a board certified advanced clinical practitioner to complete your personal care plan. Depending upon the condition, your plan could have included both over the counter or prescription medications. Please review your pharmacy choice. If there is a problem, you may call  our nursing hot line at and have the prescription routed to another pharmacy. Your safety is important to [place]. If you have drug allergies check your prescription carefully.   You can use MyChart to ask questions about today's visit, request a non-urgent call back, or ask for a work or school excuse for 24 hours related to this e-Visit. If it has been greater than 24 hours you will need to follow up with your provider, or enter a new e-Visit to address those concerns. You will get an e-mail in the next two days asking about your experience.  I hope that your e-visit has been valuable and will speed your recovery. Thank you for using e-visits.  Greater than 5 minutes, yet less than 10 minutes of time have been spent researching, coordinating, and implementing care for this patient  today.

## 2019-09-22 MED ORDER — PROMETHAZINE-DM 6.25-15 MG/5ML PO SYRP: 5.0000 mL | ORAL_SOLUTION | Freq: Four times a day (QID) | ORAL | 0 refills | Status: DC | PRN

## 2019-09-22 MED ORDER — ONDANSETRON 4 MG PO TBDP: ORAL_TABLET | ORAL | 0 refills | Status: DC

## 2019-09-22 NOTE — Addendum Note (Signed)
Addended by: Abigail Butts on: 09/22/2019 11:07 AM   Modules accepted: Orders

## 2019-09-22 NOTE — Addendum Note (Signed)
Addended by: Abigail Butts on: 09/22/2019 10:25 AM   Modules accepted: Orders

## 2019-09-23 ENCOUNTER — Emergency Department (HOSPITAL_COMMUNITY): Payer: 59

## 2019-09-23 ENCOUNTER — Encounter (HOSPITAL_COMMUNITY): Payer: Self-pay | Admitting: Student

## 2019-09-23 ENCOUNTER — Emergency Department (HOSPITAL_COMMUNITY)
Admission: EM | Admit: 2019-09-23 | Discharge: 2019-09-23 | Disposition: A | Discharge status: Home / Self Care | Payer: 59 | Attending: Emergency Medicine | Admitting: Emergency Medicine

## 2019-09-23 ENCOUNTER — Other Ambulatory Visit: Payer: Self-pay

## 2019-09-23 DIAGNOSIS — U071 COVID-19: Secondary | ICD-10-CM

## 2019-09-23 DIAGNOSIS — J45909 Unspecified asthma, uncomplicated: Secondary | ICD-10-CM | POA: Insufficient documentation

## 2019-09-23 DIAGNOSIS — R05 Cough: Secondary | ICD-10-CM | POA: Insufficient documentation

## 2019-09-23 DIAGNOSIS — R0602 Shortness of breath: Secondary | ICD-10-CM | POA: Diagnosis not present

## 2019-09-23 DIAGNOSIS — Z79899 Other long term (current) drug therapy: Secondary | ICD-10-CM | POA: Diagnosis not present

## 2019-09-23 DIAGNOSIS — R519 Headache, unspecified: Secondary | ICD-10-CM | POA: Insufficient documentation

## 2019-09-23 DIAGNOSIS — R5381 Other malaise: Secondary | ICD-10-CM | POA: Diagnosis present

## 2019-09-23 LAB — URINALYSIS, ROUTINE W REFLEX MICROSCOPIC
Bilirubin Urine: NEGATIVE
Glucose, UA: NEGATIVE mg/dL
Ketones, ur: 5 mg/dL — AB
Leukocytes,Ua: NEGATIVE
Nitrite: NEGATIVE
Protein, ur: NEGATIVE mg/dL
Specific Gravity, Urine: 1.027 (ref 1.005–1.030)
pH: 6 (ref 5.0–8.0)

## 2019-09-23 LAB — CBC WITH DIFFERENTIAL/PLATELET
Abs Immature Granulocytes: 0.04 10*3/uL (ref 0.00–0.07)
Basophils Absolute: 0 10*3/uL (ref 0.0–0.1)
Basophils Relative: 0 %
Eosinophils Absolute: 0.1 10*3/uL (ref 0.0–0.5)
Eosinophils Relative: 1 %
HCT: 45.6 % (ref 36.0–46.0)
Hemoglobin: 14.2 g/dL (ref 12.0–15.0)
Immature Granulocytes: 0 %
Lymphocytes Relative: 24 %
Lymphs Abs: 2.4 10*3/uL (ref 0.7–4.0)
MCH: 25.3 pg — ABNORMAL LOW (ref 26.0–34.0)
MCHC: 31.1 g/dL (ref 30.0–36.0)
MCV: 81.3 fL (ref 80.0–100.0)
Monocytes Absolute: 0.8 10*3/uL (ref 0.1–1.0)
Monocytes Relative: 8 %
Neutro Abs: 6.9 10*3/uL (ref 1.7–7.7)
Neutrophils Relative %: 67 %
Platelets: 354 10*3/uL (ref 150–400)
RBC: 5.61 MIL/uL — ABNORMAL HIGH (ref 3.87–5.11)
RDW: 18.8 % — ABNORMAL HIGH (ref 11.5–15.5)
WBC: 10.3 10*3/uL (ref 4.0–10.5)
nRBC: 0 % (ref 0.0–0.2)

## 2019-09-23 LAB — COMPREHENSIVE METABOLIC PANEL
ALT: 28 U/L (ref 0–44)
AST: 25 U/L (ref 15–41)
Albumin: 3.8 g/dL (ref 3.5–5.0)
Alkaline Phosphatase: 79 U/L (ref 38–126)
Anion gap: 12 (ref 5–15)
BUN: 11 mg/dL (ref 6–20)
CO2: 23 mmol/L (ref 22–32)
Calcium: 8.3 mg/dL — ABNORMAL LOW (ref 8.9–10.3)
Chloride: 104 mmol/L (ref 98–111)
Creatinine, Ser: 0.68 mg/dL (ref 0.44–1.00)
GFR calc Af Amer: >60 mL/min (ref >60)
GFR calc non Af Amer: >60 mL/min (ref >60)
Glucose, Bld: 97 mg/dL (ref 70–99)
Potassium: 3.5 mmol/L (ref 3.5–5.1)
Sodium: 139 mmol/L (ref 135–145)
Total Bilirubin: 0.8 mg/dL (ref 0.3–1.2)
Total Protein: 7.8 g/dL (ref 6.5–8.1)

## 2019-09-23 LAB — LIPASE, BLOOD: Lipase: 21 U/L (ref 11–51)

## 2019-09-23 LAB — TROPONIN I (HIGH SENSITIVITY)
Troponin I (High Sensitivity): 2 ng/L (ref <18)
Troponin I (High Sensitivity): <2 ng/L (ref <18)

## 2019-09-23 LAB — I-STAT BETA HCG BLOOD, ED (MC, WL, AP ONLY): I-stat hCG, quantitative: <5 m[IU]/mL (ref <5)

## 2019-09-23 LAB — BRAIN NATRIURETIC PEPTIDE: B Natriuretic Peptide: 36.1 pg/mL (ref 0.0–100.0)

## 2019-09-23 LAB — D-DIMER, QUANTITATIVE: D-Dimer, Quant: 1.06 ug/mL-FEU — ABNORMAL HIGH (ref 0.00–0.50)

## 2019-09-23 MED ORDER — SODIUM CHLORIDE 0.9 % IV BOLUS
500.0000 mL | Freq: Once | INTRAVENOUS | Status: AC
  Administered 2019-09-23: 500 mL via INTRAVENOUS

## 2019-09-23 MED ORDER — IOHEXOL 350 MG/ML SOLN
100.0000 mL | Freq: Once | INTRAVENOUS | Status: AC | PRN
  Administered 2019-09-23: 100 mL via INTRAVENOUS

## 2019-09-23 MED ORDER — SODIUM CHLORIDE (PF) 0.9 % IJ SOLN
INTRAMUSCULAR | Status: AC
  Filled 2019-09-23: qty 50

## 2019-09-23 MED ORDER — LIDOCAINE VISCOUS HCL 2 % MT SOLN
15.0000 mL | Freq: Once | OROMUCOSAL | Status: AC
  Administered 2019-09-23: 15 mL via ORAL
  Filled 2019-09-23: qty 15

## 2019-09-23 MED ORDER — ALUM & MAG HYDROXIDE-SIMETH 200-200-20 MG/5ML PO SUSP
30.0000 mL | Freq: Once | ORAL | Status: AC
  Administered 2019-09-23: 30 mL via ORAL
  Filled 2019-09-23: qty 30

## 2019-09-23 MED ORDER — ONDANSETRON HCL 4 MG/2ML IJ SOLN
4.0000 mg | Freq: Once | INTRAMUSCULAR | Status: AC
  Administered 2019-09-23: 4 mg via INTRAVENOUS
  Filled 2019-09-23: qty 2

## 2019-09-23 MED ORDER — ONDANSETRON HCL 4 MG/2ML IJ SOLN
4.0000 mg | Freq: Once | INTRAMUSCULAR | Status: AC
  Administered 2019-09-23: 06:00:00 4 mg via INTRAVENOUS
  Filled 2019-09-23: qty 2

## 2019-09-23 MED ORDER — ONDANSETRON HCL 4 MG PO TABS: 4.0000 mg | ORAL_TABLET | Freq: Four times a day (QID) | ORAL | 0 refills | Status: DC

## 2019-09-23 MED ORDER — BENZONATATE 100 MG PO CAPS: 100.0000 mg | ORAL_CAPSULE | Freq: Three times a day (TID) | ORAL | 0 refills | Status: DC

## 2019-09-23 MED ORDER — ALBUTEROL SULFATE HFA 108 (90 BASE) MCG/ACT IN AERS
2.0000 | INHALATION_SPRAY | Freq: Once | RESPIRATORY_TRACT | Status: AC
  Administered 2019-09-23: 2 via RESPIRATORY_TRACT
  Filled 2019-09-23: qty 6.7

## 2019-09-23 MED ORDER — AEROCHAMBER PLUS FLO-VU MEDIUM MISC
1.0000 | Freq: Once | Status: AC
  Administered 2019-09-23: 1
  Filled 2019-09-23: qty 1

## 2019-09-23 MED ORDER — ACETAMINOPHEN 325 MG PO TABS
650.0000 mg | ORAL_TABLET | Freq: Once | ORAL | Status: DC
  Filled 2019-09-23: qty 2

## 2019-09-23 MED ORDER — PANTOPRAZOLE SODIUM 20 MG PO TBEC: 20.0000 mg | DELAYED_RELEASE_TABLET | Freq: Every day | ORAL | 0 refills | Status: DC

## 2019-09-23 NOTE — ED Provider Notes (Signed)
[facility] DEPT Provider Note   CSN: [id number] Arrival date & time: 09/23/19  [id number]     History Chief complaint: Covid 19  Melissa [name] is a 29 y.o. female with a history of PCOS, anxiety, asthma, & obesity who presents to the emergency department with known positive COVID-19 testing with complaints of feeling generally poorly with acute worsening over the past 24 hours.  Patient states she has been sick for the past 5-6 days with sxs including nasal congestion, sinus headaches, sore throat, cough productive of phlegm sputum which occasionally has bright red blood, dyspnea, chest pain (worse with deep breath & coughing), fever, chills, body aches, emesis,& diarrhea. She has had poor PO intake. She has had E visits with multiple prescriptions for medicines (albuterol inhaler, tessalon, promethazine-DM, zofran, & ipratropium bromide spray) all which she has tried without relief. She feels very weak/exhausted & at times lightheaded. She is concerned she is dehydrated.  Denies unilateral leg pain/swelling, recent surgery/trauma, recent long travel, hormone use, personal hx of cancer, or hx of DVT/PE.   HPI     Past Medical History:  Diagnosis Date  . Abnormal Pap smear of cervix 07/11/2011  . Acid reflux   . Anxiety   . Asthma    activity induced  . Irregular menses   . Polycystic ovarian syndrome     Patient Active Problem List   Diagnosis Date Noted  . Morbid obesity ([place]) 01/31/2016  . Hip abductor tendinitis 01/23/2015  . Acanthosis nigricans 09/12/2014  . Snoring 09/12/2014  . Oligo-ovulation 07/04/2014  . Fall from balcony 03/14/2014  . Nausea with vomiting 11/21/2013  . Sinusitis 11/21/2013  . Nevus 02/11/2013  . GERD (gastroesophageal reflux disease) 02/11/2013  . Epigastric pain 07/11/2011  . PCOS (polycystic ovarian syndrome) 07/11/2011  . Abnormal Pap smear of cervix 07/11/2011  . Depression 07/11/2011  . Asthma 07/11/2011  . Weight  gain 07/11/2011    Past Surgical History:  Procedure Laterality Date  . NO PAST SURGERIES       OB History   No obstetric history on file.     Family History  Problem Relation Age of Onset  . Gallbladder disease Mother   . Colon polyps Mother   . Hypertension Father   . Other Father        Hepatitis A  . Stomach cancer Maternal Grandmother   . Diabetes Maternal Grandmother   . Colon polyps Maternal Grandmother   . Emphysema Maternal Grandfather   . Heart disease Paternal Grandfather   . Breast cancer Unknown        mat great aunts x 2  . Colon cancer Unknown        mat great uncles x 2  . Irritable bowel syndrome Sister     Social History   Tobacco Use  . Smoking status: Never Smoker  . Smokeless tobacco: Never Used  Substance Use Topics  . Alcohol use: Yes    Alcohol/week: 4.0 - 7.0 standard drinks    Types: 4 - 7 Cans of beer per week    Comment: 1 beer every other ay  . Drug use: No    Home Medications Prior to Admission medications   Medication Sig Start Date End Date Taking? Authorizing Provider  albuterol (VENTOLIN HFA) 108 (90 Base) MCG/ACT inhaler Inhale 1-2 puffs into the lungs every 6 (six) hours as needed for wheezing or shortness of breath. 09/20/19   Fawze, Mina A, PA-C  benzonatate (TESSALON) 100 MG capsule  Take 1 capsule (100 mg total) by mouth 3 (three) times daily as needed for cough. 09/20/19   Fawze, Mina A, PA-C  ipratropium (ATROVENT) 0.03 % nasal spray Place 2 sprays into both nostrils 3 (three) times daily as needed for rhinitis. 09/20/19   Nils Flack, Mina A, PA-C  ondansetron (ZOFRAN ODT) 4 MG disintegrating tablet 4mg  ODT q4 hours prn nausea/vomit 09/22/19   Muthersbaugh, Jarrett Soho, PA-C  promethazine-dextromethorphan (PROMETHAZINE-DM) 6.25-15 MG/5ML syrup Take 5 mLs by mouth 4 (four) times daily as needed for cough. 09/22/19   Muthersbaugh, Jarrett Soho, PA-C  ranitidine (ZANTAC) 300 MG tablet TAKE 1 TABLET (300 MG TOTAL) BY MOUTH AT BEDTIME. 03/13/17    Copland, Gay Filler, MD    Allergies    Benadryl [diphenhydramine hcl]  Review of Systems   Review of Systems  Constitutional: Positive for appetite change, chills, fatigue and fever.  HENT: Positive for congestion, sinus pain and sore throat. Negative for ear pain.   Eyes: Negative for visual disturbance.  Respiratory: Positive for cough and shortness of breath.   Cardiovascular: Positive for chest pain. Negative for leg swelling.  Gastrointestinal: Positive for diarrhea, nausea and vomiting. Negative for anal bleeding, blood in stool and constipation.  Genitourinary: Negative for dysuria.  Musculoskeletal: Positive for myalgias (generalized).  Neurological: Positive for weakness (generalized), light-headedness and headaches. Negative for syncope.  All other systems reviewed and are negative.   Physical Exam Updated Vital Signs BP (!) 133/92 (BP Location: Left Arm)   Pulse (!) 121   Temp 99.5 F (37.5 C) (Oral)   Resp 20   SpO2 97%   Physical Exam Vitals and nursing note reviewed.  Constitutional:      General: She is not in acute distress.    Appearance: She is well-developed.  HENT:     Head: Normocephalic and atraumatic.     Right Ear: Ear canal normal. Tympanic membrane is not perforated, erythematous, retracted or bulging.     Left Ear: Ear canal normal. Tympanic membrane is not perforated, erythematous, retracted or bulging.     Ears:     Comments: No mastoid erythema/swelling/tenderness.     Nose: Congestion present.     Right Sinus: No maxillary sinus tenderness or frontal sinus tenderness.     Left Sinus: No maxillary sinus tenderness or frontal sinus tenderness.     Mouth/Throat:     Mouth: Mucous membranes are dry.     Pharynx: Uvula midline. No oropharyngeal exudate or posterior oropharyngeal erythema.     Comments: Posterior oropharynx is symmetric appearing. Patient tolerating own secretions without difficulty. No trismus. No drooling. No hot potato voice.  No swelling beneath the tongue, submandibular compartment is soft.  Eyes:     General:        Right eye: No discharge.        Left eye: No discharge.     Conjunctiva/sclera: Conjunctivae normal.     Pupils: Pupils are equal, round, and reactive to light.  Cardiovascular:     Rate and Rhythm: Regular rhythm. Tachycardia present.     Heart sounds: No murmur.  Pulmonary:     Effort: Pulmonary effort is normal. No respiratory distress.     Breath sounds: Normal breath sounds. No wheezing, rhonchi or rales.     Comments: SpO2 with ambulation maintained @ 98-100% Chest:     Chest wall: Tenderness (lower anterior chest wall) present.  Abdominal:     General: There is no distension.     Palpations: Abdomen is soft.  Tenderness: There is no abdominal tenderness. There is no guarding or rebound.  Musculoskeletal:        General: No tenderness.     Cervical back: Normal range of motion and neck supple. No edema or rigidity.     Right lower leg: No edema.     Left lower leg: No edema.  Lymphadenopathy:     Cervical: No cervical adenopathy.  Skin:    General: Skin is warm and dry.     Findings: No rash.  Neurological:     Mental Status: She is alert.  Psychiatric:        Behavior: Behavior normal.     ED Results / Procedures / Treatments   Labs (all labs ordered are listed, but only abnormal results are displayed) Labs Reviewed - No data to display  EKG EKG Interpretation  Date/Time:  Friday September 23 2019 05:09:17 EST Ventricular Rate:  119 PR Interval:    QRS Duration: 71 QT Interval:  312 QTC Calculation: 439 R Axis:   48 Text Interpretation: Sinus tachycardia Consider right atrial enlargement Borderline T abnormalities, inferior leads No previous ECGs available Confirmed by Shanon Rosser [phone]) on 09/23/2019 5:17:39 AM   Radiology DG Chest Portable 1 View  Result Date: 09/23/2019 CLINICAL DATA:  Dyspnea. Fever. EXAM: PORTABLE CHEST 1 VIEW COMPARISON:  Two-view  chest x-ray 02/20/2011 FINDINGS: The heart size is exaggerate by low lung volumes. Bilateral hilar prominence likely represents pulmonary vascular congestion. No focal airspace disease is present. There is no edema or effusion. IMPRESSION: Low lung volumes and mild pulmonary vascular congestion. Electronically Signed   By: San Morelle M.D.   On: 09/23/2019 06:04    Procedures Procedures (including critical care time)  Medications Ordered in ED Medications - No data to display  ED Course  I have reviewed the triage vital signs and the nursing notes.  Pertinent labs & imaging results that were available during my care of the patient were reviewed by me and considered in my medical decision making (see chart for details).    MDM Rules/Calculators/A&P                     Patient with known positive covid 19 testing presents to the ED with complaints of worsening sxs. Nontoxic, vitals notable for tachycardia & HTN- doubt HTN emergency. Does not appear to be in respiratory distress, lungs CTA. Plan for supportive care, labs, EKG, CXR.   EKG: Sinus tachycardia Consider right atrial enlargement Borderline T abnormalities, inferior leads No previous ECGs available- no STEMI CXR: Low lung volumes and mild pulmonary vascular congestion.--> BNP added on.  Pregnancy test: Negative CBC: No significant leukocytosis, leukopenia, anemia, or platelet dysfunction.  UA: No obvious UTI. Small amount of hgb & ketones.   07:00 Patient care signed out to [facility] PA-C at change of shift pending remaining work-up, re-eval, & disposition.    Final Clinical Impression(s) / ED Diagnoses Final diagnoses:  None    Rx / DC Orders ED Discharge Orders    None       Amaryllis Dyke, PA-C 09/23/19 0704    Shanon Rosser, MD 09/23/19 [phone]

## 2019-09-23 NOTE — Discharge Instructions (Signed)
You were seen today for feeling unwell and having covid 19.  Your lab work was reassuring.  Your CT scan did not show any blood clots.  It did show that you have a pneumonia from Covid.  This will get better with rest.  I have sent a medication to the pharmacy to help with your symptoms.  Please stay hydrated with lots of water.  If you have any new or worsening symptoms please return to the emergency department.

## 2019-09-23 NOTE — ED Provider Notes (Signed)
  Physical Exam  BP (!) 124/94   Pulse (!) 113   Temp 99.5 F (37.5 C) (Oral)   Resp 19   SpO2 95%   Physical Exam  ED Course/Procedures     Procedures  MDM  Patient care assumed from [facility], [facility] due to change of shift.  Patient is a Covid positive patient presenting with nausea, vomiting, diarrhea, chest pain or shortness of breath.  When I went to reevaluate the patient she was dressed back in her normal clothes and sitting upright in the chair and reported that she was ready to leave.  Discussed with her our lab findings which were generally reassuring other than elevated D-dimer.  Given the amount of pain that she was describing initially will obtain a CTA of her chest.  Also given Tylenol, Zofran, GI cocktail.  Belly is soft and nontender.  Will plan on discharge home with symptomatic treatment if CT is negative.  She has been afebrile with normal oxygen saturations even with ambulation.   Patient was improved and lab work reassuring.  D-dimer was positive so CTA was performed which showed no signs of pulmonary embolism.  She did have some signs of atypical pneumonia which is to be suspected with COVID-19.  She was given medication for symptomatic relief and advised on return precautions.  She does have a pulse ox at home so we will monitor her oxygen and return if she has any new or worsening symptoms.    Kristine Royal 09/23/19 1119    Lacretia Leigh, MD 09/24/19 1344

## 2019-09-24 ENCOUNTER — Emergency Department (HOSPITAL_COMMUNITY): Payer: 59

## 2019-09-24 ENCOUNTER — Other Ambulatory Visit: Payer: Self-pay

## 2019-09-24 ENCOUNTER — Encounter (HOSPITAL_COMMUNITY): Payer: Self-pay | Admitting: *Deleted

## 2019-09-24 ENCOUNTER — Observation Stay (HOSPITAL_COMMUNITY)
Admission: EM | Admit: 2019-09-24 | Discharge: 2019-09-25 | Disposition: A | Discharge status: Home / Self Care | Payer: 59 | Attending: Internal Medicine | Admitting: Internal Medicine

## 2019-09-24 DIAGNOSIS — R112 Nausea with vomiting, unspecified: Secondary | ICD-10-CM

## 2019-09-24 DIAGNOSIS — R52 Pain, unspecified: Secondary | ICD-10-CM

## 2019-09-24 DIAGNOSIS — K219 Gastro-esophageal reflux disease without esophagitis: Secondary | ICD-10-CM | POA: Diagnosis not present

## 2019-09-24 DIAGNOSIS — R1013 Epigastric pain: Secondary | ICD-10-CM

## 2019-09-24 DIAGNOSIS — Z888 Allergy status to other drugs, medicaments and biological substances status: Secondary | ICD-10-CM | POA: Insufficient documentation

## 2019-09-24 DIAGNOSIS — E860 Dehydration: Secondary | ICD-10-CM | POA: Diagnosis not present

## 2019-09-24 DIAGNOSIS — Z79899 Other long term (current) drug therapy: Secondary | ICD-10-CM | POA: Diagnosis not present

## 2019-09-24 DIAGNOSIS — J45909 Unspecified asthma, uncomplicated: Secondary | ICD-10-CM | POA: Diagnosis not present

## 2019-09-24 DIAGNOSIS — J1282 Pneumonia due to coronavirus disease 2019: Secondary | ICD-10-CM | POA: Insufficient documentation

## 2019-09-24 DIAGNOSIS — J4520 Mild intermittent asthma, uncomplicated: Secondary | ICD-10-CM

## 2019-09-24 DIAGNOSIS — U071 COVID-19: Principal | ICD-10-CM

## 2019-09-24 DIAGNOSIS — R531 Weakness: Secondary | ICD-10-CM

## 2019-09-24 DIAGNOSIS — R197 Diarrhea, unspecified: Secondary | ICD-10-CM

## 2019-09-24 DIAGNOSIS — Z6836 Body mass index (BMI) 36.0-36.9, adult: Secondary | ICD-10-CM | POA: Insufficient documentation

## 2019-09-24 DIAGNOSIS — R000 Tachycardia, unspecified: Secondary | ICD-10-CM | POA: Insufficient documentation

## 2019-09-24 DIAGNOSIS — R0600 Dyspnea, unspecified: Secondary | ICD-10-CM

## 2019-09-24 DIAGNOSIS — R8271 Bacteriuria: Secondary | ICD-10-CM | POA: Diagnosis not present

## 2019-09-24 DIAGNOSIS — F419 Anxiety disorder, unspecified: Secondary | ICD-10-CM | POA: Insufficient documentation

## 2019-09-24 DIAGNOSIS — Z8616 Personal history of COVID-19: Secondary | ICD-10-CM | POA: Diagnosis present

## 2019-09-24 DIAGNOSIS — E876 Hypokalemia: Secondary | ICD-10-CM

## 2019-09-24 LAB — CBC
HCT: 43.2 % (ref 36.0–46.0)
Hemoglobin: 13.1 g/dL (ref 12.0–15.0)
MCH: 24.7 pg — ABNORMAL LOW (ref 26.0–34.0)
MCHC: 30.3 g/dL (ref 30.0–36.0)
MCV: 81.5 fL (ref 80.0–100.0)
Platelets: 338 10*3/uL (ref 150–400)
RBC: 5.3 MIL/uL — ABNORMAL HIGH (ref 3.87–5.11)
RDW: 17.6 % — ABNORMAL HIGH (ref 11.5–15.5)
WBC: 10.6 10*3/uL — ABNORMAL HIGH (ref 4.0–10.5)
nRBC: 0.2 % (ref 0.0–0.2)

## 2019-09-24 LAB — COMPREHENSIVE METABOLIC PANEL
ALT: 25 U/L (ref 0–44)
AST: 21 U/L (ref 15–41)
Albumin: 3.5 g/dL (ref 3.5–5.0)
Alkaline Phosphatase: 74 U/L (ref 38–126)
Anion gap: 15 (ref 5–15)
BUN: 9 mg/dL (ref 6–20)
CO2: 21 mmol/L — ABNORMAL LOW (ref 22–32)
Calcium: 8.2 mg/dL — ABNORMAL LOW (ref 8.9–10.3)
Chloride: 104 mmol/L (ref 98–111)
Creatinine, Ser: 0.86 mg/dL (ref 0.44–1.00)
GFR calc Af Amer: >60 mL/min (ref >60)
GFR calc non Af Amer: >60 mL/min (ref >60)
Glucose, Bld: 78 mg/dL (ref 70–99)
Potassium: 3.1 mmol/L — ABNORMAL LOW (ref 3.5–5.1)
Sodium: 140 mmol/L (ref 135–145)
Total Bilirubin: 0.2 mg/dL — ABNORMAL LOW (ref 0.3–1.2)
Total Protein: 7.4 g/dL (ref 6.5–8.1)

## 2019-09-24 LAB — URINALYSIS, ROUTINE W REFLEX MICROSCOPIC
Bilirubin Urine: NEGATIVE
Glucose, UA: NEGATIVE mg/dL
Ketones, ur: 80 mg/dL — AB
Leukocytes,Ua: NEGATIVE
Nitrite: NEGATIVE
Protein, ur: 100 mg/dL — AB
Specific Gravity, Urine: 1.03 (ref 1.005–1.030)
pH: 5 (ref 5.0–8.0)

## 2019-09-24 LAB — RESPIRATORY PANEL BY RT PCR (FLU A&B, COVID)
Influenza A by PCR: NEGATIVE
Influenza B by PCR: NEGATIVE
SARS Coronavirus 2 by RT PCR: POSITIVE — AB

## 2019-09-24 LAB — LIPASE, BLOOD: Lipase: 22 U/L (ref 11–51)

## 2019-09-24 MED ORDER — POTASSIUM CHLORIDE CRYS ER 20 MEQ PO TBCR
40.0000 meq | EXTENDED_RELEASE_TABLET | Freq: Once | ORAL | Status: AC
  Administered 2019-09-24: 23:00:00 40 meq via ORAL
  Filled 2019-09-24: qty 2

## 2019-09-24 MED ORDER — METOCLOPRAMIDE HCL 5 MG/ML IJ SOLN
10.0000 mg | Freq: Once | INTRAMUSCULAR | Status: AC
  Administered 2019-09-24: 23:00:00 10 mg via INTRAVENOUS
  Filled 2019-09-24: qty 2

## 2019-09-24 MED ORDER — ACETAMINOPHEN 500 MG PO TABS
1000.0000 mg | ORAL_TABLET | Freq: Once | ORAL | Status: AC
  Administered 2019-09-24: 23:00:00 1000 mg via ORAL
  Filled 2019-09-24: qty 2

## 2019-09-24 MED ORDER — FAMOTIDINE IN NACL 20-0.9 MG/50ML-% IV SOLN
20.0000 mg | Freq: Once | INTRAVENOUS | Status: AC
  Administered 2019-09-24: 23:00:00 20 mg via INTRAVENOUS
  Filled 2019-09-24: qty 50

## 2019-09-24 MED ORDER — ONDANSETRON HCL 4 MG/2ML IJ SOLN
4.0000 mg | Freq: Once | INTRAMUSCULAR | Status: AC
  Administered 2019-09-24: 20:00:00 4 mg via INTRAVENOUS
  Filled 2019-09-24: qty 2

## 2019-09-24 MED ORDER — SODIUM CHLORIDE 0.9 % IV SOLN
1.0000 g | Freq: Once | INTRAVENOUS | Status: AC
  Administered 2019-09-25: 1 g via INTRAVENOUS
  Filled 2019-09-24: qty 10

## 2019-09-24 MED ORDER — SODIUM CHLORIDE 0.9 % IV BOLUS
1000.0000 mL | Freq: Once | INTRAVENOUS | Status: AC
  Administered 2019-09-24: 20:00:00 1000 mL via INTRAVENOUS

## 2019-09-24 MED ORDER — LORAZEPAM 2 MG/ML IJ SOLN
1.0000 mg | Freq: Once | INTRAMUSCULAR | Status: AC
  Administered 2019-09-24: 20:00:00 1 mg via INTRAVENOUS
  Filled 2019-09-24: qty 1

## 2019-09-24 NOTE — ED Notes (Signed)
Phlebotomy called to draw labs.

## 2019-09-24 NOTE — ED Notes (Signed)
Attempted straight stick for blood draw, unable to obtain, will request help from phlebotomy

## 2019-09-24 NOTE — ED Triage Notes (Signed)
The pt is here for chest pain and difficulty breathing  She had  A positive covid test this past Wednesday.. she was seen at [facility] long ed last pm and was diagnosed with pneumonia  She came back today with difficulty breathing and some vomiting    She refused to go back to [facility] because she reports that they treated her mean .  She had lab work there and a chest xray  Her covid test was performed at [facility]  Elevated temp today.  lmp irregular

## 2019-09-24 NOTE — ED Provider Notes (Addendum)
[facility] EMERGENCY DEPARTMENT Provider Note   CSN: [id number] Arrival date & time: 09/24/19  1854     History Chief Complaint  Patient presents with  . Pneumonia    Melissa [name] is a 29 y.o. female.  Patient c/o general malaise, intermittent non prod cough, fever, mild sob with cough/exertion, and intermittent nausea/vomiting/diarrhea in the past 5-6 days. States symptoms acute onset, moderate, persistent, slowly worse. States was seen in [facility] ED yesterday for same and told symptoms likely due to covid. Patient reports positive test 4 days ago. Denies chest pain. Some upper abdominal pain especially post vomiting - described as crampy, dull, non radiating. After several episodes vomiting today, not bloody, did have episode nv with some brb in it. States diarrheal stools up until last evening - no melena. No dysuria or gu c/o. No vaginal discharge or bleeding.   The history is provided by the patient.  Pneumonia Associated symptoms include shortness of breath. Pertinent negatives include no chest pain, no abdominal pain and no headaches.       Past Medical History:  Diagnosis Date  . Abnormal Pap smear of cervix 07/11/2011  . Acid reflux   . Anxiety   . Asthma    activity induced  . Irregular menses   . Polycystic ovarian syndrome     Patient Active Problem List   Diagnosis Date Noted  . Morbid obesity ([place]) 01/31/2016  . Hip abductor tendinitis 01/23/2015  . Acanthosis nigricans 09/12/2014  . Snoring 09/12/2014  . Oligo-ovulation 07/04/2014  . Fall from balcony 03/14/2014  . Nausea with vomiting 11/21/2013  . Sinusitis 11/21/2013  . Nevus 02/11/2013  . GERD (gastroesophageal reflux disease) 02/11/2013  . Epigastric pain 07/11/2011  . PCOS (polycystic ovarian syndrome) 07/11/2011  . Abnormal Pap smear of cervix 07/11/2011  . Depression 07/11/2011  . Asthma 07/11/2011  . Weight gain 07/11/2011    Past Surgical History:  Procedure Laterality  Date  . NO PAST SURGERIES       OB History   No obstetric history on file.     Family History  Problem Relation Age of Onset  . Gallbladder disease Mother   . Colon polyps Mother   . Hypertension Father   . Other Father        Hepatitis A  . Stomach cancer Maternal Grandmother   . Diabetes Maternal Grandmother   . Colon polyps Maternal Grandmother   . Emphysema Maternal Grandfather   . Heart disease Paternal Grandfather   . Breast cancer Other        mat great aunts x 2  . Colon cancer Other        mat great uncles x 2  . Irritable bowel syndrome Sister     Social History   Tobacco Use  . Smoking status: Never Smoker  . Smokeless tobacco: Never Used  Substance Use Topics  . Alcohol use: Yes    Alcohol/week: 4.0 - 7.0 standard drinks    Types: 4 - 7 Cans of beer per week    Comment: 1 beer every other ay  . Drug use: No    Home Medications Prior to Admission medications   Medication Sig Start Date End Date Taking? Authorizing Provider  albuterol (VENTOLIN HFA) 108 (90 Base) MCG/ACT inhaler Inhale 1-2 puffs into the lungs every 6 (six) hours as needed for wheezing or shortness of breath. 09/20/19   Fawze, Mina A, PA-C  benzonatate (TESSALON) 100 MG capsule Take 1 capsule (100 mg  total) by mouth every 8 (eight) hours. 09/23/19   Madilyn Hook A, PA-C  ipratropium (ATROVENT) 0.03 % nasal spray Place 2 sprays into both nostrils 3 (three) times daily as needed for rhinitis. Patient not taking: Reported on 09/23/2019 09/20/19   Rodell Perna A, PA-C  ondansetron (ZOFRAN ODT) 4 MG disintegrating tablet 4mg  ODT q4 hours prn nausea/vomit 09/22/19   Muthersbaugh, Jarrett Soho, PA-C  ondansetron (ZOFRAN) 4 MG tablet Take 1 tablet (4 mg total) by mouth every 6 (six) hours. 09/23/19   Alveria Apley, PA-C  pantoprazole (PROTONIX) 20 MG tablet Take 1 tablet (20 mg total) by mouth daily. 09/23/19 10/23/19  Alveria Apley, PA-C  promethazine-dextromethorphan (PROMETHAZINE-DM) 6.25-15 MG/5ML syrup  Take 5 mLs by mouth 4 (four) times daily as needed for cough. 09/22/19   Muthersbaugh, Jarrett Soho, PA-C  ranitidine (ZANTAC) 300 MG tablet TAKE 1 TABLET (300 MG TOTAL) BY MOUTH AT BEDTIME. Patient not taking: Reported on 09/23/2019 03/13/17   Copland, Gay Filler, MD    Allergies    Benadryl [diphenhydramine hcl]  Review of Systems   Review of Systems  Constitutional: Positive for fever.  HENT: Negative for sore throat.   Eyes: Negative for redness.  Respiratory: Positive for cough and shortness of breath.   Cardiovascular: Negative for chest pain.  Gastrointestinal: Positive for diarrhea, nausea and vomiting. Negative for abdominal pain.  Endocrine: Negative for polyuria.  Genitourinary: Negative for dysuria and flank pain.  Musculoskeletal: Positive for myalgias. Negative for back pain and neck pain.  Skin: Negative for rash.  Neurological: Negative for headaches.  Hematological: Does not bruise/bleed easily.  Psychiatric/Behavioral: Negative for confusion.    Physical Exam Updated Vital Signs BP (!) 120/93 (BP Location: Right Arm)   Pulse (!) 135   Temp 100 F (37.8 C) (Oral)   Resp (!) 26   Wt 89.6 kg   LMP 09/14/2019   SpO2 96%   BMI 36.13 kg/m   Physical Exam Vitals and nursing note reviewed.  Constitutional:      Appearance: Normal appearance. Melissa is well-developed.  HENT:     Head: Atraumatic.     Nose: Nose normal.     Mouth/Throat:     Mouth: Mucous membranes are moist.  Eyes:     General: No scleral icterus.    Conjunctiva/sclera: Conjunctivae normal.     Pupils: Pupils are equal, round, and reactive to light.  Neck:     Trachea: No tracheal deviation.     Comments: Trachea midline. No l/a. No gross thyromegaly or tenderness.  Cardiovascular:     Rate and Rhythm: Regular rhythm. Tachycardia present.     Pulses: Normal pulses.     Heart sounds: Normal heart sounds. No murmur. No friction rub. No gallop.   Pulmonary:     Effort: Pulmonary effort is normal.  No respiratory distress.     Breath sounds: Normal breath sounds.  Abdominal:     General: Bowel sounds are normal. There is no distension.     Palpations: Abdomen is soft. There is no mass.     Tenderness: There is no abdominal tenderness. There is no guarding or rebound.     Hernia: No hernia is present.  Genitourinary:    Comments: No cva tenderness.  Musculoskeletal:        General: No swelling or tenderness.     Cervical back: Normal range of motion and neck supple. No rigidity or tenderness. No muscular tenderness.     Right lower leg: No edema.  Left lower leg: No edema.  Skin:    General: Skin is warm and dry.     Findings: No rash.  Neurological:     Mental Status: Melissa is alert.     Comments: Alert, speech normal.   Psychiatric:     Comments: Anxious, tearful.      ED Results / Procedures / Treatments   Labs (all labs ordered are listed, but only abnormal results are displayed) Results for orders placed or performed during the hospital encounter of 09/24/19  Respiratory Panel by RT PCR (Flu A&B, Covid) - Nasopharyngeal Swab   Specimen: Nasopharyngeal Swab  Result Value Ref Range   SARS Coronavirus 2 by RT PCR POSITIVE (A) NEGATIVE   Influenza A by PCR NEGATIVE NEGATIVE   Influenza B by PCR NEGATIVE NEGATIVE  CBC  Result Value Ref Range   WBC 10.6 (H) 4.0 - 10.5 K/uL   RBC 5.30 (H) 3.87 - 5.11 MIL/uL   Hemoglobin 13.1 12.0 - 15.0 g/dL   HCT 43.2 36.0 - 46.0 %   MCV 81.5 80.0 - 100.0 fL   MCH 24.7 (L) 26.0 - 34.0 pg   MCHC 30.3 30.0 - 36.0 g/dL   RDW 17.6 (H) 11.5 - 15.5 %   Platelets 338 150 - 400 K/uL   nRBC 0.2 0.0 - 0.2 %  Comprehensive metabolic panel  Result Value Ref Range   Sodium 140 135 - 145 mmol/L   Potassium 3.1 (L) 3.5 - 5.1 mmol/L   Chloride 104 98 - 111 mmol/L   CO2 21 (L) 22 - 32 mmol/L   Glucose, Bld 78 70 - 99 mg/dL   BUN 9 6 - 20 mg/dL   Creatinine, Ser 0.86 0.44 - 1.00 mg/dL   Calcium 8.2 (L) 8.9 - 10.3 mg/dL   Total Protein 7.4  6.5 - 8.1 g/dL   Albumin 3.5 3.5 - 5.0 g/dL   AST 21 15 - 41 U/L   ALT 25 0 - 44 U/L   Alkaline Phosphatase 74 38 - 126 U/L   Total Bilirubin 0.2 (L) 0.3 - 1.2 mg/dL   GFR calc non Af Amer >60 >60 mL/min   GFR calc Af Amer >60 >60 mL/min   Anion gap 15 5 - 15  Lipase, blood  Result Value Ref Range   Lipase 22 11 - 51 U/L   CT Angio Chest PE W and/or Wo Contrast  Result Date: 09/23/2019 CLINICAL DATA:  Shortness of breath.  COVID-19 positive EXAM: CT ANGIOGRAPHY CHEST WITH CONTRAST TECHNIQUE: Multidetector CT imaging of the chest was performed using the standard protocol during bolus administration of intravenous contrast. Multiplanar CT image reconstructions and MIPs were obtained to evaluate the vascular anatomy. CONTRAST:  138mL OMNIPAQUE IOHEXOL 350 MG/ML SOLN COMPARISON:  Chest radiograph September 23, 2019 FINDINGS: Cardiovascular: There is no demonstrable pulmonary embolus. There is no thoracic aortic aneurysm or dissection. The visualized great vessels appear normal. Note that the left vertebral artery arises directly from the aortic arch, an anatomic variant. There is no pericardial effusion or pericardial thickening. Mediastinum/Nodes: Thyroid appears unremarkable. There are subcentimeter axillary lymph nodes which do not meet size criteria for pathologic significance. There is a prominent lymph node to the left of the distal trachea measuring 1.3 x 1.1 cm. No other lymph node enlargement evident. There is a focal hiatal hernia. Lungs/Pleura: There is ground-glass type opacity in portions of the anterior and posterior segments of the right upper lobe. There is also an apparent area of ground-glass  opacity in the inferior lingula. No pleural effusions are evident. There is slight bibasilar atelectasis. Upper Abdomen: Visualized upper abdominal structures appear unremarkable. Musculoskeletal: No blastic or lytic bone lesions. No chest wall lesions evident. Review of the MIP images confirms the  above findings. IMPRESSION: 1. No pulmonary embolus. No thoracic aortic aneurysm or dissection. 2. Foci of ground-glass type opacity in the right upper lobe and inferior lingula, likely due to atypical organism pneumonia. 3. Mildly enlarged left paratracheal lymph node. Question reactive etiology given the underlying parenchymal lung changes. 4.  Focal hiatal hernia. Electronically Signed   By: Lowella Grip III M.D.   On: 09/23/2019 10:55   DG Chest Port 1 View  Result Date: 09/24/2019 CLINICAL DATA:  Shortness of breath. Chest pain and difficulty breathing. COVID positive 2 days ago. EXAM: PORTABLE CHEST 1 VIEW COMPARISON:  Radiograph and chest CTA yesterday. FINDINGS: Persistent low lung volumes with slightly improved aeration from radiograph yesterday. Patchy airspace opacity in the right suprahilar region corresponds to ground-glass opacities on CT. Additional areas of ground-glass nodularity on CT are not seen radiographically. No pleural fluid or pneumothorax. Unchanged heart size and mediastinal contours. IMPRESSION: Slight worsening right perihilar airspace opacities since radiograph yesterday consistent with known COVID pneumonia. Electronically Signed   By: Keith Rake M.D.   On: 09/24/2019 20:31   DG Chest Portable 1 View  Result Date: 09/23/2019 CLINICAL DATA:  Dyspnea. Fever. EXAM: PORTABLE CHEST 1 VIEW COMPARISON:  Two-view chest x-ray 02/20/2011 FINDINGS: The heart size is exaggerate by low lung volumes. Bilateral hilar prominence likely represents pulmonary vascular congestion. No focal airspace disease is present. There is no edema or effusion. IMPRESSION: Low lung volumes and mild pulmonary vascular congestion. Electronically Signed   By: San Morelle M.D.   On: 09/23/2019 06:04    EKG EKG Interpretation  Date/Time:  Saturday September 24 2019 18:59:38 EST Ventricular Rate:  138 PR Interval:  144 QRS Duration: 74 QT Interval:  286 QTC Calculation: 433 R Axis:    56 Text Interpretation: Sinus tachycardia Right atrial enlargement Nonspecific ST abnormality Confirmed by Lajean Saver [id number]) on 09/24/2019 7:33:24 PM   Radiology CT Angio Chest PE W and/or Wo Contrast  Result Date: 09/23/2019 CLINICAL DATA:  Shortness of breath.  COVID-19 positive EXAM: CT ANGIOGRAPHY CHEST WITH CONTRAST TECHNIQUE: Multidetector CT imaging of the chest was performed using the standard protocol during bolus administration of intravenous contrast. Multiplanar CT image reconstructions and MIPs were obtained to evaluate the vascular anatomy. CONTRAST:  175mL OMNIPAQUE IOHEXOL 350 MG/ML SOLN COMPARISON:  Chest radiograph September 23, 2019 FINDINGS: Cardiovascular: There is no demonstrable pulmonary embolus. There is no thoracic aortic aneurysm or dissection. The visualized great vessels appear normal. Note that the left vertebral artery arises directly from the aortic arch, an anatomic variant. There is no pericardial effusion or pericardial thickening. Mediastinum/Nodes: Thyroid appears unremarkable. There are subcentimeter axillary lymph nodes which do not meet size criteria for pathologic significance. There is a prominent lymph node to the left of the distal trachea measuring 1.3 x 1.1 cm. No other lymph node enlargement evident. There is a focal hiatal hernia. Lungs/Pleura: There is ground-glass type opacity in portions of the anterior and posterior segments of the right upper lobe. There is also an apparent area of ground-glass opacity in the inferior lingula. No pleural effusions are evident. There is slight bibasilar atelectasis. Upper Abdomen: Visualized upper abdominal structures appear unremarkable. Musculoskeletal: No blastic or lytic bone lesions. No chest wall lesions evident. Review  of the MIP images confirms the above findings. IMPRESSION: 1. No pulmonary embolus. No thoracic aortic aneurysm or dissection. 2. Foci of ground-glass type opacity in the right upper lobe and inferior  lingula, likely due to atypical organism pneumonia. 3. Mildly enlarged left paratracheal lymph node. Question reactive etiology given the underlying parenchymal lung changes. 4.  Focal hiatal hernia. Electronically Signed   By: Lowella Grip III M.D.   On: 09/23/2019 10:55   DG Chest Portable 1 View  Result Date: 09/23/2019 CLINICAL DATA:  Dyspnea. Fever. EXAM: PORTABLE CHEST 1 VIEW COMPARISON:  Two-view chest x-ray 02/20/2011 FINDINGS: The heart size is exaggerate by low lung volumes. Bilateral hilar prominence likely represents pulmonary vascular congestion. No focal airspace disease is present. There is no edema or effusion. IMPRESSION: Low lung volumes and mild pulmonary vascular congestion. Electronically Signed   By: San Morelle M.D.   On: 09/23/2019 06:04    Procedures Procedures (including critical care time)  Medications Ordered in ED Medications  sodium chloride 0.9 % bolus 1,000 mL (1,000 mLs Intravenous New Bag/Given 09/24/19 2007)  LORazepam (ATIVAN) injection 1 mg (1 mg Intravenous Given 09/24/19 2007)  ondansetron (ZOFRAN) injection 4 mg (4 mg Intravenous Given 09/24/19 2007)    ED Course  I have reviewed the triage vital signs and the nursing notes.  Pertinent labs & imaging results that were available during my care of the patient were reviewed by me and considered in my medical decision making (see chart for details).    MDM Rules/Calculators/A&P                      Iv ns. Continuous pulse ox and monitor. Stat labs and imaging.  Reviewed nursing notes and prior charts for additional history.  Symptoms do appear c/w covid, however in Care Everywhere, covid test from outside facility this past week appears to show negative result - will get covid test in ED to check.   Astoria Askari was evaluated in Emergency Department on 09/24/2019 for the symptoms described in the history of present illness. Melissa [name],  Melissa necessitated consideration that the patient might be at risk for infection with the SARS-CoV-2 virus that causes COVID-19. Institutional protocols and algorithms that pertain to the evaluation of patients at risk for COVID-19 are in a state of rapid change based on information released by regulatory bodies including the CDC and federal and state organizations. These policies and algorithms were followed during the patient's care in the ED.  Iv ns bolus. zofran iv.   Labs reviewed/interpreted by me - k is low 3.1. w nv, will give iv, and po when able. Mg added to labs. covid is positive.   Xrays reviewed/interpreted by me - increased infiltrate on cxr compared to prior - ?worsening covid pna.   Recheck abd soft, mild epigastric tenderness, no peritoneal signs.   Persistent nausea/vomiting, unable to tolerate po. pepcid iv, reglan iv. Will add abd imaging.   Patient with persistent weakness, tachycardia, nv, in setting of covid - will admit/obs. hospitalist consulted. Additional covid labs added to workup.   CRITICAL CARE RE: COVID infection with worsening covid PNA, dehydration, tachycardia/HR 138, hypokalemia with iv k replacement.  Performed by: Mirna Mires Total critical care time: 40 minutes Critical care time was exclusive of separately billable procedures and treating other patients. Critical care was necessary to treat or prevent imminent or life-threatening deterioration. Critical care was time spent  personally by me on the following activities: development of treatment plan with patient and/or surrogate as well as nursing, discussions with consultants, evaluation of patient's response to treatment, examination of patient, obtaining history from patient or surrogate, ordering and performing treatments and interventions, ordering and review of laboratory studies, ordering and review of radiographic studies, pulse oximetry and re-evaluation of patient's condition.      Final  Clinical Impression(s) / ED Diagnoses Final diagnoses:  None    Rx / DC Orders ED Discharge Orders    None           Lajean Saver, MD 10/03/19 1106

## 2019-09-24 NOTE — H&P (Signed)
Melissa [name] [id number] DOB: September 18, 1990 DOA: 09/24/2019     PCP: [name], MD   Outpatient Specialists:  NONE    Patient arrived to ER on 09/24/19 at [place]  Patient coming from: home Lives With family    Chief Complaint:   Chief Complaint  Patient presents with  . Pneumonia    HPI: Melissa [name] is a 29 y.o. female with medical history significant of asthma and obesity    Presented with   fever shortness of breath cough for the past 5 days.  Patient states that she was exposed to her family friend last week who unknowingly was Covid positive. Patient lives with her children. She denies denies chest pain but reports nausea vomiting diarrhea significant abdominal pain increased work of breathing.  Decreased p.o. intake.  She has been seen in the emergency department yesterday at [facility] long chest x-ray at that time showed bilateral infiltrates.  She comes back today worsening infiltrates tachycardic and tachypneic   Infectious risk factors:  Reports fever, shortness of breath, dry cough, chest pain, Sore throat, URI symptoms, N/V/Diarrhea/abdominal pain,  Body aches, severe fatigue Reports known sick contacts, known COVID 19 exposure,    KNOWN COVID POSITIVE   In  ER RAPID PCR COVID TEST     Lab Results  Component Value Date   [place] (A) 09/24/2019   [place] Not Detected 07/21/2019     Regarding pertinent Chronic problems:     obesity-   BMI Readings from Last 1 Encounters:  09/24/19 36.13 kg/m      Asthma -well  controlled on home inhalers       While in ER: Noted to be tachycardic and tachypneic x-ray showing Covid pneumonia     The following Work up has been ordered so far:  Orders Placed This Encounter  Procedures  . Respiratory Panel by RT PCR (Flu A&B, Covid) - Nasopharyngeal Swab  . DG Chest Port 1 View  . DG Abd Portable 2V  . CBC  . Comprehensive metabolic panel  . Urinalysis, Routine w reflex microscopic  .  Rapid urine drug screen (hospital performed)  . Lipase, blood  . Procalcitonin  . Ferritin  . Lactate dehydrogenase  . D-dimer, quantitative  . Triglycerides  . Fibrinogen  . C-reactive protein  . Magnesium  . Offer Fluids  . Consult to hospitalist  ALL PATIENTS BEING ADMITTED/HAVING PROCEDURES NEED COVID-19 SCREENING  . Airborne and Contact precautions  . EKG 12-Lead  . ED EKG    Following Medications were ordered in ER: Medications  famotidine (PEPCID) IVPB 20 mg premix (20 mg Intravenous New Bag/Given 09/24/19 2310)  sodium chloride 0.9 % bolus 1,000 mL (0 mLs Intravenous Stopped 09/24/19 2304)  LORazepam (ATIVAN) injection 1 mg (1 mg Intravenous Given 09/24/19 2007)  ondansetron (ZOFRAN) injection 4 mg (4 mg Intravenous Given 09/24/19 2007)  potassium chloride SA (KLOR-CON) CR tablet 40 mEq (40 mEq Oral Given 09/24/19 2241)  acetaminophen (TYLENOL) tablet 1,000 mg (1,000 mg Oral Given 09/24/19 2241)  metoCLOPramide (REGLAN) injection 10 mg (10 mg Intravenous Given 09/24/19 2308)        Consult Orders  (From admission, onward)         Start     Ordered   09/24/19 2323  Consult to hospitalist  ALL PATIENTS BEING ADMITTED/HAVING PROCEDURES NEED COVID-19 SCREENING  Once    Comments: ALL PATIENTS BEING ADMITTED/HAVING PROCEDURES NEED COVID-19 SCREENING  Provider:  (Not yet assigned)  Question Answer Comment  Place call to: [facility]   Reason for Consult Admit      09/24/19 2322          Significant initial  Findings: Abnormal Labs Reviewed  RESPIRATORY PANEL BY RT PCR (FLU A&B, COVID) - Abnormal; Notable for the following components:      Result Value   SARS Coronavirus 2 by RT PCR POSITIVE (*)    All other components within normal limits  CBC - Abnormal; Notable for the following components:   WBC 10.6 (*)    RBC 5.30 (*)    MCH 24.7 (*)    RDW 17.6 (*)    All other components within normal limits  COMPREHENSIVE METABOLIC PANEL - Abnormal; Notable for the  following components:   Potassium 3.1 (*)    CO2 21 (*)    Calcium 8.2 (*)    Total Bilirubin 0.2 (*)    All other components within normal limits     Otherwise labs showing:    Recent Labs  Lab 09/23/19 0806 09/24/19 1944  NA 139 140  K 3.5 3.1*  CO2 23 21*  GLUCOSE 97 78  BUN 11 9  CREATININE 0.68 0.86  CALCIUM 8.3* 8.2*    Cr   Up from baseline see below Lab Results  Component Value Date   CREATININE 0.86 09/24/2019   CREATININE 0.68 09/23/2019   CREATININE 0.55 02/09/2017    Recent Labs  Lab 09/23/19 0806 09/24/19 1944  AST 25 21  ALT 28 25  ALKPHOS 79 74  BILITOT 0.8 0.2*  PROT 7.8 7.4  ALBUMIN 3.8 3.5   Lab Results  Component Value Date   CALCIUM 8.2 (L) 09/24/2019     WBC      Component Value Date/Time   WBC 10.6 (H) 09/24/2019 1944   ANC    Component Value Date/Time   NEUTROABS 6.9 09/23/2019 0550   ALC No components found for: LYMPHAB    Plt: Lab Results  Component Value Date   PLT 338 09/24/2019     Procalcitonin   Ordered   COVID-19 Labs  Recent Labs    09/23/19 0823  DDIMER 1.06*    Lab Results  Component Value Date   SARSCOV2NAA POSITIVE (A) 09/24/2019   [place] Not Detected 07/21/2019    HG/HCT stable,       Component Value Date/Time   HGB 13.1 09/24/2019 1944   HCT 43.2 09/24/2019 1944    Recent Labs  Lab 09/23/19 0806 09/24/19 1944  LIPASE 21 22   No results for input(s): AMMONIA in the last 168 hours.  No components found for: LABALBU   Troponin <2     ECG: Ordered Personally reviewed by me showing: HR : 133 Rhythm:   Sinus tachycardia  Right atria enargement  no evidence of ischemic changes QTC 433   BNP (last 3 results) Recent Labs    09/23/19 0557  BNP 36.1        UA bacteruria   Urine analysis:    Component Value Date/Time   COLORURINE AMBER (A) 09/24/2019 2328   APPEARANCEUR HAZY (A) 09/24/2019 2328   LABSPEC 1.030 09/24/2019 2328   PHURINE 5.0 09/24/2019 2328    GLUCOSEU NEGATIVE 09/24/2019 2328   GLUCOSEU NEGATIVE 02/09/2017 0837   HGBUR MODERATE (A) 09/24/2019 2328   BILIRUBINUR NEGATIVE 09/24/2019 2328   BILIRUBINUR negative 05/15/2016 1810   KETONESUR 80 (A) 09/24/2019 2328   PROTEINUR 100 (A) 09/24/2019 2328   UROBILINOGEN 0.2 02/09/2017 [id number]  NITRITE NEGATIVE 09/24/2019 [place] 09/24/2019 2328       Ordered  KUB non-acute  CXR - worsening infiltrates   CTA chest -   no PE,   evidence of infiltrate     ED Triage Vitals  Enc Vitals Group     BP 09/24/19 1906 (!) 120/93     Pulse Rate 09/24/19 1906 (!) 135     Resp 09/24/19 1906 (!) 26     Temp 09/24/19 1906 100 F (37.8 C)     Temp Source 09/24/19 1906 Oral     SpO2 09/24/19 1906 96 %     Weight 09/24/19 1909 197 lb 8.5 oz (89.6 kg)     Height --      Head Circumference --      Peak Flow --      Pain Score 09/24/19 1909 9     Pain Loc --      Pain Edu? --      Excl. in [place]? --   TMAX(24)@       Latest  Blood pressure 119/85, pulse (!) 123, temperature 98.8 F (37.1 C), temperature source Oral, resp. rate 13, weight 89.6 kg, last menstrual period 09/14/2019, SpO2 97 %.    Hospitalist was called for admission for COVID PNA   Review of Systems:    Pertinent positives include:  Fevers, chills, fatigue,  abdominal pain, nausea, vomiting, diarrhea,  shortness of breath at rest.   dyspnea on exertion, loss of appetite, nasal congestion, non-productive cough, Constitutional:  No weight loss, night sweats, weight loss  HEENT:  No headaches, Difficulty swallowing,Tooth/dental problems,Sore throat,  No sneezing, itching, ear ache,  post nasal drip,  Cardio-vascular:  No chest pain, Orthopnea, PND, anasarca, dizziness, palpitations.no Bilateral lower extremity swelling  GI:  No heartburn, indigestion,change in bowel habits, melena, blood in stool, hematemesis Resp:  no No excess mucus, no productive cough,   No coughing up of blood.No change in color  of mucus.No wheezing. Skin:  no rash or lesions. No jaundice GU:  no dysuria, change in color of urine, no urgency or frequency. No straining to urinate.  No flank pain.  Musculoskeletal:  No joint pain or no joint swelling. No decreased range of motion. No back pain.  Psych:  No change in mood or affect. No depression or anxiety. No memory loss.  Neuro: no localizing neurological complaints, no tingling, no weakness, no double vision, no gait abnormality, no slurred speech, no confusion  All systems reviewed and apart from [place] all are negative  Past Medical History:   Past Medical History:  Diagnosis Date  . Abnormal Pap smear of cervix 07/11/2011  . Acid reflux   . Anxiety   . Asthma    activity induced  . Irregular menses   . Polycystic ovarian syndrome       Past Surgical History:  Procedure Laterality Date  . NO PAST SURGERIES      Social History:  Ambulatory   independently       reports that she has never smoked. She has never used smokeless tobacco. She reports current alcohol use of about 4.0 - 7.0 standard drinks of alcohol per week. She reports that she does not use drugs.     Family History:   Family History  Problem Relation Age of Onset  . Gallbladder disease Mother   . Colon polyps Mother   . Hypertension Father   . Other Father  Hepatitis A  . Stomach cancer Maternal Grandmother   . Diabetes Maternal Grandmother   . Colon polyps Maternal Grandmother   . Emphysema Maternal Grandfather   . Heart disease Paternal Grandfather   . Breast cancer Other        mat great aunts x 2  . Colon cancer Other        mat great uncles x 2  . Irritable bowel syndrome Sister     Allergies: Allergies  Allergen Reactions  . Benadryl [Diphenhydramine Hcl] Other (See Comments)    Heart races     Prior to Admission medications   Medication Sig Start Date End Date Taking? Authorizing Provider  albuterol (VENTOLIN HFA) 108 (90 Base) MCG/ACT inhaler  Inhale 1-2 puffs into the lungs every 6 (six) hours as needed for wheezing or shortness of breath. 09/20/19   Fawze, Mina A, PA-C  benzonatate (TESSALON) 100 MG capsule Take 1 capsule (100 mg total) by mouth every 8 (eight) hours. 09/23/19   Madilyn Hook A, PA-C  ipratropium (ATROVENT) 0.03 % nasal spray Place 2 sprays into both nostrils 3 (three) times daily as needed for rhinitis. Patient not taking: Reported on 09/23/2019 09/20/19   Rodell Perna A, PA-C  ondansetron (ZOFRAN ODT) 4 MG disintegrating tablet 4mg  ODT q4 hours prn nausea/vomit 09/22/19   Muthersbaugh, Jarrett Soho, PA-C  ondansetron (ZOFRAN) 4 MG tablet Take 1 tablet (4 mg total) by mouth every 6 (six) hours. 09/23/19   Alveria Apley, PA-C  pantoprazole (PROTONIX) 20 MG tablet Take 1 tablet (20 mg total) by mouth daily. 09/23/19 10/23/19  Alveria Apley, PA-C  promethazine-dextromethorphan (PROMETHAZINE-DM) 6.25-15 MG/5ML syrup Take 5 mLs by mouth 4 (four) times daily as needed for cough. 09/22/19   Muthersbaugh, Jarrett Soho, PA-C  ranitidine (ZANTAC) 300 MG tablet TAKE 1 TABLET (300 MG TOTAL) BY MOUTH AT BEDTIME. Patient not taking: Reported on 09/23/2019 03/13/17   Copland, Gay Filler, MD   Physical Exam: Blood pressure 119/85, pulse (!) 123, temperature 98.8 F (37.1 C), temperature source Oral, resp. rate 13, weight 89.6 kg, last menstrual period 09/14/2019, SpO2 97 %. 1. General:  in No  Acute distress   Chronically ill   -appearing 2. Psychological: Alert and   Oriented 3. Head/ENT:     Dry Mucous Membranes                          Head Non traumatic, neck supple                           Poor Dentition 4. SKIN:  decreased Skin turgor,  Skin clean Dry and intact no rash 5. Heart: Regular rate and rhythm no  Murmur, no Rub or gallop 6. Lungs:   no wheezes or crackles   7. Abdomen: Soft, non-tender, Non distended   8. Lower extremities: no clubbing, cyanosis, no edema 9. Neurologically Grossly intact, moving all 4 extremities equally   10.  MSK: Normal range of motion   All other LABS:     Recent Labs  Lab 09/23/19 0550 09/24/19 1944  WBC 10.3 10.6*  NEUTROABS 6.9  --   HGB 14.2 13.1  HCT 45.6 43.2  MCV 81.3 81.5  PLT 354 338     Recent Labs  Lab 09/23/19 0806 09/24/19 1944  NA 139 140  K 3.5 3.1*  CL 104 104  CO2 23 21*  GLUCOSE 97 78  BUN 11  9  CREATININE 0.68 0.86  CALCIUM 8.3* 8.2*     Recent Labs  Lab 09/23/19 0806 09/24/19 1944  AST 25 21  ALT 28 25  ALKPHOS 79 74  BILITOT 0.8 0.2*  PROT 7.8 7.4  ALBUMIN 3.8 3.5       Cultures:    Component Value Date/Time   SDES THROAT 10/02/2013 1312   SPECREQUEST NONE 10/02/2013 1312   CULT  10/02/2013 1312    No Beta Hemolytic Streptococci Isolated Performed at [facility] 10/04/2013 FINAL 10/02/2013 1312     Radiological Exams on Admission: CT Angio Chest PE W and/or Wo Contrast  Result Date: 09/23/2019 CLINICAL DATA:  Shortness of breath.  COVID-19 positive EXAM: CT ANGIOGRAPHY CHEST WITH CONTRAST TECHNIQUE: Multidetector CT imaging of the chest was performed using the standard protocol during bolus administration of intravenous contrast. Multiplanar CT image reconstructions and MIPs were obtained to evaluate the vascular anatomy. CONTRAST:  181mL OMNIPAQUE IOHEXOL 350 MG/ML SOLN COMPARISON:  Chest radiograph September 23, 2019 FINDINGS: Cardiovascular: There is no demonstrable pulmonary embolus. There is no thoracic aortic aneurysm or dissection. The visualized great vessels appear normal. Note that the left vertebral artery arises directly from the aortic arch, an anatomic variant. There is no pericardial effusion or pericardial thickening. Mediastinum/Nodes: Thyroid appears unremarkable. There are subcentimeter axillary lymph nodes which do not meet size criteria for pathologic significance. There is a prominent lymph node to the left of the distal trachea measuring 1.3 x 1.1 cm. No other lymph node enlargement evident. There  is a focal hiatal hernia. Lungs/Pleura: There is ground-glass type opacity in portions of the anterior and posterior segments of the right upper lobe. There is also an apparent area of ground-glass opacity in the inferior lingula. No pleural effusions are evident. There is slight bibasilar atelectasis. Upper Abdomen: Visualized upper abdominal structures appear unremarkable. Musculoskeletal: No blastic or lytic bone lesions. No chest wall lesions evident. Review of the MIP images confirms the above findings. IMPRESSION: 1. No pulmonary embolus. No thoracic aortic aneurysm or dissection. 2. Foci of ground-glass type opacity in the right upper lobe and inferior lingula, likely due to atypical organism pneumonia. 3. Mildly enlarged left paratracheal lymph node. Question reactive etiology given the underlying parenchymal lung changes. 4.  Focal hiatal hernia. Electronically Signed   By: Lowella Grip III M.D.   On: 09/23/2019 10:55   DG Chest Port 1 View  Result Date: 09/24/2019 CLINICAL DATA:  Shortness of breath. Chest pain and difficulty breathing. COVID positive 2 days ago. EXAM: PORTABLE CHEST 1 VIEW COMPARISON:  Radiograph and chest CTA yesterday. FINDINGS: Persistent low lung volumes with slightly improved aeration from radiograph yesterday. Patchy airspace opacity in the right suprahilar region corresponds to ground-glass opacities on CT. Additional areas of ground-glass nodularity on CT are not seen radiographically. No pleural fluid or pneumothorax. Unchanged heart size and mediastinal contours. IMPRESSION: Slight worsening right perihilar airspace opacities since radiograph yesterday consistent with known COVID pneumonia. Electronically Signed   By: Keith Rake M.D.   On: 09/24/2019 20:31   DG Chest Portable 1 View  Result Date: 09/23/2019 CLINICAL DATA:  Dyspnea. Fever. EXAM: PORTABLE CHEST 1 VIEW COMPARISON:  Two-view chest x-ray 02/20/2011 FINDINGS: The heart size is exaggerate by low lung  volumes. Bilateral hilar prominence likely represents pulmonary vascular congestion. No focal airspace disease is present. There is no edema or effusion. IMPRESSION: Low lung volumes and mild pulmonary vascular congestion. Electronically Signed   By: Harrell Gave  Mattern M.D.   On: 09/23/2019 06:04    Chart has been reviewed   Assessment/Plan   29 y.o. female with medical history significant of asthma and obesity  Admitted for COvid PNA and dehydration  Present on Admission: . Pneumonia due to COVID-19 virus -   FROM HOME   WITH KNOWN HX OF COVID19 ER Novel Corona Virus testing: Ordered 09/24/19 and is  positive      CXR: hazy bilateral peripheral opacities  CT chest: GGO,      -Following work-up initiated:        Blood cultures   Ordered 09/24/19,   CTA negative for PE 09/23/19   Following complications noted:   nausea/vomiting Diarrhea , decreased PO intake resulting in dehydration - will rehydrate   Plan of treatment: - Transfer to [facility] facility if   bed is available  Spoke to dr. Vanita Ingles   And pharmacy consult for remdesivir   - Will follow daily d.dimer - Assess for ability to prone  - Supportive management -Fluid sparing resuscitation  -Provide oxygen as needed currently on RA  SpO2: 95 % - IF d.dimer elvated >5 will increase dose of lovenox Discussed possibility of using monoclonal antibody with pharmacy currently no protocol but this will be discussed in the future  Poor Prognostic factors  29 y.o.   Personal hx of asthma obesity  Evidence of  organ damage  Present  tachycardia present on admission    Will order Airborne and Contact precautions  Family/ patient prognosis discussion: I have discussed case with the patient  who are aware of their prognosis At this point they would like to be full code    . Asthma - chronic stable cont albuterol  inh  . GERD (gastroesophageal reflux disease) - chronic cont home meds  . Morbid obesity ([place]) - will need  follow up with nutrition as an outpatient  . Dehydration - will gently rehydrate  Bacteruria - await result of urine cult in ER given one dose of rocephin, pt with back pain and nausea but also covid, for now will continue  Other plan as per orders.  DVT prophylaxis:  Lovenox     Code Status:  FULL CODE  as per patient                I had personally discussed CODE STATUS with patient   Family Communication:   Family not at  Bedside   Disposition Plan:   To home once workup is complete and patient is stable    Admission status:  ED Disposition    None      Obs     Level of care     tele  please discontinue once patient no longer qualifies   Precautions: admitted as  covid positive Airborne and Contact precautions   PPE: Used by the provider:   P100  eye Goggles,  Gloves  gown   [id number] Esthela Brandner 09/24/2019, 1:10 AM    [facility]     after 2 AM please page floor coverage PA If 7AM-7PM, please contact the day team taking care of the patient using [email or web address]   Patient was evaluated in the context of the global COVID-19 pandemic, which necessitated consideration that the patient might be at risk for infection with the SARS-CoV-2 virus that causes COVID-19. Institutional protocols and algorithms that pertain to the evaluation of patients at risk for COVID-19 are in a state of rapid change based on information released by regulatory  bodies including the CDC and federal and state organizations. These policies and algorithms were followed during the patient's care.

## 2019-09-25 ENCOUNTER — Encounter (HOSPITAL_COMMUNITY): Payer: Self-pay | Admitting: Internal Medicine

## 2019-09-25 ENCOUNTER — Observation Stay (HOSPITAL_COMMUNITY): Payer: 59

## 2019-09-25 ENCOUNTER — Other Ambulatory Visit: Payer: Self-pay

## 2019-09-25 DIAGNOSIS — E860 Dehydration: Secondary | ICD-10-CM | POA: Diagnosis present

## 2019-09-25 DIAGNOSIS — Z8616 Personal history of COVID-19: Secondary | ICD-10-CM | POA: Diagnosis present

## 2019-09-25 DIAGNOSIS — U071 COVID-19: Secondary | ICD-10-CM | POA: Diagnosis present

## 2019-09-25 LAB — LACTATE DEHYDROGENASE: LDH: 166 U/L (ref 98–192)

## 2019-09-25 LAB — COMPREHENSIVE METABOLIC PANEL
ALT: 20 U/L (ref 0–44)
AST: 16 U/L (ref 15–41)
Albumin: 3.1 g/dL — ABNORMAL LOW (ref 3.5–5.0)
Alkaline Phosphatase: 67 U/L (ref 38–126)
Anion gap: 11 (ref 5–15)
BUN: 11 mg/dL (ref 6–20)
CO2: 22 mmol/L (ref 22–32)
Calcium: 7.9 mg/dL — ABNORMAL LOW (ref 8.9–10.3)
Chloride: 108 mmol/L (ref 98–111)
Creatinine, Ser: 0.65 mg/dL (ref 0.44–1.00)
GFR calc Af Amer: >60 mL/min (ref >60)
GFR calc non Af Amer: >60 mL/min (ref >60)
Glucose, Bld: 77 mg/dL (ref 70–99)
Potassium: 3.8 mmol/L (ref 3.5–5.1)
Sodium: 141 mmol/L (ref 135–145)
Total Bilirubin: 0.4 mg/dL (ref 0.3–1.2)
Total Protein: 6.8 g/dL (ref 6.5–8.1)

## 2019-09-25 LAB — CBC WITH DIFFERENTIAL/PLATELET
Abs Immature Granulocytes: 0.05 10*3/uL (ref 0.00–0.07)
Basophils Absolute: 0 10*3/uL (ref 0.0–0.1)
Basophils Relative: 0 %
Eosinophils Absolute: 0 10*3/uL (ref 0.0–0.5)
Eosinophils Relative: 0 %
HCT: 36.6 % (ref 36.0–46.0)
Hemoglobin: 11.3 g/dL — ABNORMAL LOW (ref 12.0–15.0)
Immature Granulocytes: 1 %
Lymphocytes Relative: 21 %
Lymphs Abs: 1.9 10*3/uL (ref 0.7–4.0)
MCH: 24.8 pg — ABNORMAL LOW (ref 26.0–34.0)
MCHC: 30.9 g/dL (ref 30.0–36.0)
MCV: 80.3 fL (ref 80.0–100.0)
Monocytes Absolute: 0.8 10*3/uL (ref 0.1–1.0)
Monocytes Relative: 8 %
Neutro Abs: 6.3 10*3/uL (ref 1.7–7.7)
Neutrophils Relative %: 70 %
Platelets: 291 10*3/uL (ref 150–400)
RBC: 4.56 MIL/uL (ref 3.87–5.11)
RDW: 17.5 % — ABNORMAL HIGH (ref 11.5–15.5)
WBC: 9.1 10*3/uL (ref 4.0–10.5)
nRBC: 0 % (ref 0.0–0.2)

## 2019-09-25 LAB — MAGNESIUM
Magnesium: 2 mg/dL (ref 1.7–2.4)
Magnesium: 2 mg/dL (ref 1.7–2.4)

## 2019-09-25 LAB — RAPID URINE DRUG SCREEN, HOSP PERFORMED
Amphetamines: NOT DETECTED
Barbiturates: NOT DETECTED
Benzodiazepines: POSITIVE — AB
Cocaine: NOT DETECTED
Opiates: NOT DETECTED
Tetrahydrocannabinol: POSITIVE — AB

## 2019-09-25 LAB — FERRITIN: Ferritin: 75 ng/mL (ref 11–307)

## 2019-09-25 LAB — C-REACTIVE PROTEIN
CRP: 17.6 mg/dL — ABNORMAL HIGH (ref <1.0)
CRP: 17 mg/dL — ABNORMAL HIGH (ref <1.0)

## 2019-09-25 LAB — PROCALCITONIN: Procalcitonin: <0.1 ng/mL

## 2019-09-25 LAB — HCG, QUANTITATIVE, PREGNANCY: hCG, Beta Chain, Quant, S: <1 m[IU]/mL (ref <5)

## 2019-09-25 LAB — FIBRINOGEN: Fibrinogen: 568 mg/dL — ABNORMAL HIGH (ref 210–475)

## 2019-09-25 LAB — D-DIMER, QUANTITATIVE
D-Dimer, Quant: 0.38 ug/mL-FEU (ref 0.00–0.50)
D-Dimer, Quant: 0.46 ug/mL-FEU (ref 0.00–0.50)

## 2019-09-25 LAB — HIV ANTIBODY (ROUTINE TESTING W REFLEX): HIV Screen 4th Generation wRfx: NONREACTIVE

## 2019-09-25 LAB — TRIGLYCERIDES: Triglycerides: 83 mg/dL (ref <150)

## 2019-09-25 LAB — CK: Total CK: 44 U/L (ref 38–234)

## 2019-09-25 MED ORDER — HYDROCODONE-ACETAMINOPHEN 5-325 MG PO TABS: 1.0000 | ORAL_TABLET | ORAL | Status: DC | PRN

## 2019-09-25 MED ORDER — FAMOTIDINE IN NACL 20-0.9 MG/50ML-% IV SOLN: 20.0000 mg | Freq: Two times a day (BID) | INTRAVENOUS | Status: DC

## 2019-09-25 MED ORDER — IPRATROPIUM BROMIDE 0.06 % NA SOLN
2.0000 | Freq: Every day | NASAL | Status: DC
  Administered 2019-09-25: 10:00:00 2 via NASAL
  Filled 2019-09-25: qty 15

## 2019-09-25 MED ORDER — PROMETHAZINE HCL 25 MG/ML IJ SOLN
25.0000 mg | Freq: Once | INTRAMUSCULAR | Status: AC
  Administered 2019-09-25: 25 mg via INTRAVENOUS
  Filled 2019-09-25: qty 1

## 2019-09-25 MED ORDER — IPRATROPIUM BROMIDE 0.03 % NA SOLN: 2.0000 | Freq: Two times a day (BID) | NASAL | Status: DC

## 2019-09-25 MED ORDER — LACTATED RINGERS IV BOLUS
1000.0000 mL | Freq: Once | INTRAVENOUS | Status: AC
  Administered 2019-09-25: 08:00:00 1000 mL via INTRAVENOUS

## 2019-09-25 MED ORDER — SODIUM CHLORIDE 0.9 % IV SOLN
200.0000 mg | Freq: Once | INTRAVENOUS | Status: AC
  Administered 2019-09-25: 200 mg via INTRAVENOUS
  Filled 2019-09-25: qty 200

## 2019-09-25 MED ORDER — LIDOCAINE VISCOUS HCL 2 % MT SOLN
15.0000 mL | Freq: Once | OROMUCOSAL | Status: AC
  Administered 2019-09-25: 08:00:00 15 mL via ORAL
  Filled 2019-09-25: qty 15

## 2019-09-25 MED ORDER — ONDANSETRON 4 MG PO TBDP: ORAL_TABLET | ORAL | 0 refills | Status: DC

## 2019-09-25 MED ORDER — SODIUM CHLORIDE 0.9 % IV SOLN: 100.0000 mg | Freq: Every day | INTRAVENOUS | Status: DC

## 2019-09-25 MED ORDER — ONDANSETRON HCL 4 MG/2ML IJ SOLN
4.0000 mg | Freq: Four times a day (QID) | INTRAMUSCULAR | Status: DC | PRN
  Administered 2019-09-25 (×2): 4 mg via INTRAVENOUS
  Filled 2019-09-25 (×2): qty 2

## 2019-09-25 MED ORDER — CEPHALEXIN 500 MG PO CAPS: 500.0000 mg | ORAL_CAPSULE | Freq: Three times a day (TID) | ORAL | 0 refills | Status: AC

## 2019-09-25 MED ORDER — METHYLPREDNISOLONE 4 MG PO TBPK: ORAL_TABLET | ORAL | 0 refills | Status: DC

## 2019-09-25 MED ORDER — ONDANSETRON HCL 4 MG PO TABS: 4.0000 mg | ORAL_TABLET | Freq: Four times a day (QID) | ORAL | Status: DC | PRN

## 2019-09-25 MED ORDER — POTASSIUM CHLORIDE 10 MEQ/100ML IV SOLN: 10.0000 meq | INTRAVENOUS | Status: DC

## 2019-09-25 MED ORDER — ASCORBIC ACID 500 MG PO TABS
500.0000 mg | ORAL_TABLET | Freq: Every day | ORAL | Status: DC
  Administered 2019-09-25: 10:00:00 500 mg via ORAL
  Filled 2019-09-25: qty 1

## 2019-09-25 MED ORDER — SODIUM CHLORIDE 0.9 % IV SOLN: 1.0000 g | INTRAVENOUS | Status: DC

## 2019-09-25 MED ORDER — ALBUTEROL SULFATE HFA 108 (90 BASE) MCG/ACT IN AERS
1.0000 | INHALATION_SPRAY | Freq: Four times a day (QID) | RESPIRATORY_TRACT | Status: DC | PRN
  Filled 2019-09-25: qty 6.7

## 2019-09-25 MED ORDER — SODIUM CHLORIDE 0.9% FLUSH
3.0000 mL | Freq: Two times a day (BID) | INTRAVENOUS | Status: DC
  Administered 2019-09-25 (×2): 3 mL via INTRAVENOUS

## 2019-09-25 MED ORDER — GUAIFENESIN-DM 100-10 MG/5ML PO SYRP: 10.0000 mL | ORAL_SOLUTION | ORAL | Status: DC | PRN

## 2019-09-25 MED ORDER — INFLUENZA VAC SPLIT QUAD 0.5 ML IM SUSY: 0.5000 mL | PREFILLED_SYRINGE | INTRAMUSCULAR | Status: DC

## 2019-09-25 MED ORDER — ENOXAPARIN SODIUM 40 MG/0.4ML ~~LOC~~ SOLN
40.0000 mg | SUBCUTANEOUS | Status: DC
  Administered 2019-09-25: 08:00:00 40 mg via SUBCUTANEOUS
  Filled 2019-09-25: qty 0.4

## 2019-09-25 MED ORDER — SODIUM CHLORIDE 0.9 % IV SOLN: INTRAVENOUS | Status: DC

## 2019-09-25 MED ORDER — ZINC SULFATE 220 (50 ZN) MG PO CAPS
220.0000 mg | ORAL_CAPSULE | Freq: Every day | ORAL | Status: DC
  Administered 2019-09-25: 220 mg via ORAL
  Filled 2019-09-25: qty 1

## 2019-09-25 MED ORDER — METHYLPREDNISOLONE SODIUM SUCC 125 MG IJ SOLR
60.0000 mg | Freq: Two times a day (BID) | INTRAMUSCULAR | Status: DC
  Administered 2019-09-25: 12:00:00 60 mg via INTRAVENOUS
  Filled 2019-09-25: qty 2

## 2019-09-25 MED ORDER — ALUM & MAG HYDROXIDE-SIMETH 200-200-20 MG/5ML PO SUSP
30.0000 mL | Freq: Once | ORAL | Status: AC
  Administered 2019-09-25: 30 mL via ORAL
  Filled 2019-09-25 (×2): qty 30

## 2019-09-25 MED ORDER — ACETAMINOPHEN 325 MG PO TABS
650.0000 mg | ORAL_TABLET | Freq: Four times a day (QID) | ORAL | Status: DC | PRN
  Administered 2019-09-25: 650 mg via ORAL
  Filled 2019-09-25: qty 2

## 2019-09-25 MED ORDER — PANTOPRAZOLE SODIUM 40 MG IV SOLR
40.0000 mg | Freq: Two times a day (BID) | INTRAVENOUS | Status: DC
  Administered 2019-09-25: 10:00:00 40 mg via INTRAVENOUS
  Filled 2019-09-25: qty 40

## 2019-09-25 NOTE — ED Notes (Signed)
[place], [place] to transport pt to [facility]

## 2019-09-25 NOTE — Progress Notes (Signed)
SATURATION QUALIFICATIONS: (This note is used to comply with regulatory documentation for home oxygen)  Patient Saturations on Room Air at Rest = 98%  Patient Saturations on Room Air while Ambulating = 96%  Patient Saturations on 0 Liters of oxygen while Ambulating = 96%  Please briefly explain why patient needs home oxygen:  Needs no home oxygen.

## 2019-09-25 NOTE — Discharge Instructions (Signed)
You are scheduled for an outpatient infusion of Remdesivir at 4:00 PM on Monday 1/25, Tuesday 1/26, Wednesday 1/27 and Thursday 1/28.Marland Kitchen  Please report to [facility] at [address].  Drive to the security guard and tell them you are here for an infusion. They will direct you to the front entrance where we will come and get you.  For questions call [phone].  Thanks    Follow with Primary MD [name], MD in 7 days   Get CBC, CMP, 2 view Chest X ray -  checked next visit within 1 week by Primary MD    Activity: As tolerated with Full fall precautions use Gibril Mastro/cane & assistance as needed  Disposition Home    Diet: Heart Healthy   Special Instructions: If you have smoked or chewed Tobacco  in the last 2 yrs please stop smoking, stop any regular Alcohol  and or any Recreational drug use.  On your next visit with your primary care physician please Get Medicines reviewed and adjusted.  Please request your Prim.MD to go over all Hospital Tests and Procedure/Radiological results at the follow up, please get all Hospital records sent to your Prim MD by signing hospital release before you go home.  If you experience worsening of your admission symptoms, develop shortness of breath, life threatening emergency, suicidal or homicidal thoughts you must seek medical attention immediately by calling 911 or calling your MD immediately  if symptoms less severe.  You Must read complete instructions/literature along with all the possible adverse reactions/side effects for all the Medicines you take and that have been prescribed to you. Take any new Medicines after you have completely understood and accpet all the possible adverse reactions/side effects.     Please note  You were cared for by a hospitalist during your hospital stay. If you have any questions about your discharge medications or the care you received while you were in the hospital after you are discharged, you can call  the unit and asked to speak with the hospitalist on call if the hospitalist that took care of you is not available. Once you are discharged, your primary care physician will handle any further medical issues. Please note that NO REFILLS for any discharge medications will be authorized once you are discharged, as it is imperative that you return to your primary care physician (or establish a relationship with a primary care physician if you do not have one) for your aftercare needs so that they can reassess your need for medications and monitor your lab values.     Person Under Monitoring Name: Melissa [name]  Location: [place] [id number]   Infection Prevention Recommendations for Individuals Confirmed to have, or Being Evaluated for, 2019 Novel Coronavirus (COVID-19) Infection Who Receive Care at Home  Individuals who are confirmed to have, or are being evaluated for, COVID-19 should follow the prevention steps below until a healthcare provider or local or state health department says they can return to normal activities.  Stay home except to get medical care You should restrict activities outside your home, except for getting medical care. Do not go to work, school, or public areas, and do not use public transportation or taxis.  Call ahead before visiting your doctor Before your medical appointment, call the healthcare provider and tell them that you have, or are being evaluated for, COVID-19 infection. This will help the healthcare provider's office take steps to keep other people from getting infected. Ask  your healthcare provider to call the local or state health department.  Monitor your symptoms Seek prompt medical attention if your illness is worsening (e.g., difficulty breathing). Before going to your medical appointment, call the healthcare provider and tell them that you have, or are being evaluated for, COVID-19 infection. Ask your healthcare provider  to call the local or state health department.  Wear a facemask You should wear a facemask that covers your nose and mouth when you are in the same room with other people and when you visit a healthcare provider. People who live with or visit you should also wear a facemask while they are in the same room with you.  Separate yourself from other people in your home As much as possible, you should stay in a different room from other people in your home. Also, you should use a separate bathroom, if available.  Avoid sharing household items You should not share dishes, drinking glasses, cups, eating utensils, towels, bedding, or other items with other people in your home. After using these items, you should wash them thoroughly with soap and water.  Cover your coughs and sneezes Cover your mouth and nose with a tissue when you cough or sneeze, or you can cough or sneeze into your sleeve. Throw used tissues in a lined trash can, and immediately wash your hands with soap and water for at least 20 seconds or use an alcohol-based hand rub.  Wash your [facility] your hands often and thoroughly with soap and water for at least 20 seconds. You can use an alcohol-based hand sanitizer if soap and water are not available and if your hands are not visibly dirty. Avoid touching your eyes, nose, and mouth with unwashed hands.   Prevention Steps for Caregivers and Household Members of Individuals Confirmed to have, or Being Evaluated for, COVID-19 Infection Being Cared for in the Home  If you live with, or provide care at home for, a person confirmed to have, or being evaluated for, COVID-19 infection please follow these guidelines to prevent infection:  Follow healthcare provider's instructions Make sure that you understand and can help the patient follow any healthcare provider instructions for all care.  Provide for the patient's basic needs You should help the patient with basic needs in the  home and provide support for getting groceries, prescriptions, and other personal needs.  Monitor the patient's symptoms If they are getting sicker, call his or her medical provider and tell them that the patient has, or is being evaluated for, COVID-19 infection. This will help the healthcare provider's office take steps to keep other people from getting infected. Ask the healthcare provider to call the local or state health department.  Limit the number of people who have contact with the patient  If possible, have only one caregiver for the patient.  Other household members should stay in another home or place of residence. If this is not possible, they should stay  in another room, or be separated from the patient as much as possible. Use a separate bathroom, if available.  Restrict visitors who do not have an essential need to be in the home.  Keep older adults, very young children, and other sick people away from the patient Keep older adults, very young children, and those who have compromised immune systems or chronic health conditions away from the patient. This includes people with chronic heart, lung, or kidney conditions, diabetes, and cancer.  Ensure good ventilation Make sure that shared spaces in  the home have good air flow, such as from an air conditioner or an opened window, weather permitting.  Wash your hands often  Wash your hands often and thoroughly with soap and water for at least 20 seconds. You can use an alcohol based hand sanitizer if soap and water are not available and if your hands are not visibly dirty.  Avoid touching your eyes, nose, and mouth with unwashed hands.  Use disposable paper towels to dry your hands. If not available, use dedicated cloth towels and replace them when they become wet.  Wear a facemask and gloves  Wear a disposable facemask at all times in the room and gloves when you touch or have contact with the patient's blood, body  fluids, and/or secretions or excretions, such as sweat, saliva, sputum, nasal mucus, vomit, urine, or feces.  Ensure the mask fits over your nose and mouth tightly, and do not touch it during use.  Throw out disposable facemasks and gloves after using them. Do not reuse.  Wash your hands immediately after removing your facemask and gloves.  If your personal clothing becomes contaminated, carefully remove clothing and launder. Wash your hands after handling contaminated clothing.  Place all used disposable facemasks, gloves, and other waste in a lined container before disposing them with other household waste.  Remove gloves and wash your hands immediately after handling these items.  Do not share dishes, glasses, or other household items with the patient  Avoid sharing household items. You should not share dishes, drinking glasses, cups, eating utensils, towels, bedding, or other items with a patient who is confirmed to have, or being evaluated for, COVID-19 infection.  After the person uses these items, you should wash them thoroughly with soap and water.  Wash laundry thoroughly  Immediately remove and wash clothes or bedding that have blood, body fluids, and/or secretions or excretions, such as sweat, saliva, sputum, nasal mucus, vomit, urine, or feces, on them.  Wear gloves when handling laundry from the patient.  Read and follow directions on labels of laundry or clothing items and detergent. In general, wash and dry with the warmest temperatures recommended on the label.  Clean all areas the individual has used often  Clean all touchable surfaces, such as counters, tabletops, doorknobs, bathroom fixtures, toilets, phones, keyboards, tablets, and bedside tables, every day. Also, clean any surfaces that may have blood, body fluids, and/or secretions or excretions on them.  Wear gloves when cleaning surfaces the patient has come in contact with.  Use a diluted bleach solution  (e.g., dilute bleach with 1 part bleach and 10 parts water) or a household disinfectant with a label that says EPA-registered for coronaviruses. To make a bleach solution at home, add 1 tablespoon of bleach to 1 quart (4 cups) of water. For a larger supply, add  cup of bleach to 1 gallon (16 cups) of water.  Read labels of cleaning products and follow recommendations provided on product labels. Labels contain instructions for safe and effective use of the cleaning product including precautions you should take when applying the product, such as wearing gloves or eye protection and making sure you have good ventilation during use of the product.  Remove gloves and wash hands immediately after cleaning.  Monitor yourself for signs and symptoms of illness Caregivers and household members are considered close contacts, should monitor their health, and will be asked to limit movement outside of the home to the extent possible. Follow the monitoring steps for close contacts listed  on the symptom monitoring form.   ? If you have additional questions, contact your local health department or call the epidemiologist on call at [phone] (available 24/7). ? This guidance is subject to change. For the most up-to-date guidance from [facility], please refer to their website: [email or web address]

## 2019-09-25 NOTE — ED Notes (Signed)
Pt left with [facility]

## 2019-09-25 NOTE — Discharge Summary (Signed)
Melissa [name] SHN:[id number] DOB: 07-07-1991 DOA: 09/24/2019  PCP: [name], MD  Admit date: 09/24/2019  Discharge date: 09/25/2019  Admitted From: Home   Disposition:  Home   Recommendations for Outpatient Follow-up:   Follow up with PCP in 1-2 weeks  PCP Please obtain BMP/CBC, 2 view CXR in 1week,  (see Discharge instructions)   PCP Please follow up on the following pending results:    Home Health: None   Equipment/Devices:   Consultations: None  Discharge Condition: Stable    CODE STATUS: Full    Diet Recommendation: Heart Healthy      Chief Complaint  Patient presents with  . Pneumonia     Brief history of present illness from the day of admission and additional interim summary                                                                   Hospital Course   29 year old African-American female with history of asthma, obesity with BMI of 36 who was admitted to the hospital for COVID-19 related gastroenteritis, she had stable blood work, no hypoxia, no shortness of breath, only symptoms were nausea vomiting and some diarrhea.  She was kept here and 23-hour observation, he received IV fluids, remdesivir and IV steroids, besides mild nausea no other symptoms.  Will be discharged home with oral steroid taper and as needed Zofran with PCP follow-up.  She does have pneumonia on CT chest but no clinical symptoms of tach.  She has been scheduled in the outpatient remdesivir infusion clinic for 4 infusions which she will finish starting tomorrow.   She also has history of asthma which is completely stable.  Possible UTI, received a dose of Rocephin here, will give her 2 days of Keflex upon discharge, PCP to follow culture results.   SpO2: 93 %  Recent Labs  Lab 09/23/19 0557 09/23/19 0823  09/24/19 2020 09/24/19 2353 09/25/19 0928 09/25/19 0929  CRP  --   --   --  17.6*  --  17.0*  DDIMER  --  1.06*  --  0.46 0.38  --   FERRITIN  --   --   --  75  --   --   BNP 36.1  --   --   --   --   --   PROCALCITON  --   --   --  <0.10  --   --   SARSCOV2NAA  --   --  POSITIVE*  --   --   --     Hepatic Function Latest Ref Rng & Units 09/25/2019 09/24/2019 09/23/2019  Total Protein 6.5 - 8.1 g/dL 6.8 7.4 7.8  Albumin 3.5 - 5.0 g/dL 3.1(L) 3.5 3.8  AST 15 - 41 U/L 16 21 25   ALT 0 -  44 U/L 20 25 28   Alk Phosphatase 38 - 126 U/L 67 74 79  Total Bilirubin 0.3 - 1.2 mg/dL 0.4 0.2(L) 0.8  Bilirubin, Direct 0.0 - 0.3 mg/dL - - -    Discharge diagnosis     Active Problems:   Asthma   GERD (gastroesophageal reflux disease)   Morbid obesity ([place])   Pneumonia due to COVID-19 virus   Dehydration  Discharge instructions    Discharge Instructions    Diet - low sodium heart healthy   Complete by: As directed    Discharge instructions   Complete by: As directed    You are scheduled for an outpatient infusion of Remdesivir at 4:00 PM on Monday 1/25, Tuesday 1/26, Wednesday 1/27 and Thursday 1/28.Marland Kitchen  Please [facility] to [facility] at [address].  Drive to the security guard and tell them you are here for an infusion. They will direct you to the front entrance where we will come and get you.  For questions call [phone].  Thanks   Follow with Primary MD [name], MD in 7 days   Get CBC, CMP, 2 view Chest X ray -  checked next visit within 1 week by Primary MD    Activity: As tolerated with Full fall precautions use walker/cane & assistance as needed  Disposition Home    Diet: Heart Healthy   Special Instructions: If you have smoked or chewed Tobacco  in the last 2 yrs please stop smoking, stop any regular Alcohol  and or any Recreational drug use.  On your next visit with your primary care physician please Get Medicines reviewed and adjusted.  Please  request your Prim.MD to go over all Hospital Tests and Procedure/Radiological results at the follow up, please get all Hospital records sent to your Prim MD by signing hospital release before you go home.  If you experience worsening of your admission symptoms, develop shortness of breath, life threatening emergency, suicidal or homicidal thoughts you must seek medical attention immediately by calling 911 or calling your MD immediately  if symptoms less severe.  You Must read complete instructions/literature along with all the possible adverse reactions/side effects for all the Medicines you take and that have been prescribed to you. Take any new Medicines after you have completely understood and accpet all the possible adverse reactions/side effects.     Please note  You were cared for by a hospitalist during your hospital stay. If you have any questions about your discharge medications or the care you received while you were in the hospital after you are discharged, you can call the unit and asked to speak with the hospitalist on call if the hospitalist that took care of you is not available. Once you are discharged, your primary care physician will handle any further medical issues. Please note that NO REFILLS for any discharge medications will be authorized once you are discharged, as it is imperative that you return to your primary care physician (or establish a relationship with a primary care physician if you do not have one) for your aftercare needs so that they can reassess your need for medications and monitor your lab values.   Increase activity slowly   Complete by: As directed       Discharge Medications   Allergies as of 09/25/2019      Reactions   Benadryl [diphenhydramine Hcl] Other (See Comments)   Heart races      Medication List    STOP  taking these medications   ipratropium 0.03 % nasal spray Commonly known as: ATROVENT   ondansetron 4 MG tablet Commonly known as:  ZOFRAN     TAKE these medications   albuterol 108 (90 Base) MCG/ACT inhaler Commonly known as: VENTOLIN HFA Inhale 1-2 puffs into the lungs every 6 (six) hours as needed for wheezing or shortness of breath.   benzonatate 100 MG capsule Commonly known as: TESSALON Take 1 capsule (100 mg total) by mouth every 8 (eight) hours.   cephALEXin 500 MG capsule Commonly known as: KEFLEX Take 1 capsule (500 mg total) by mouth 3 (three) times daily for 2 days.   methylPREDNISolone 4 MG Tbpk tablet Commonly known as: MEDROL DOSEPAK follow package directions   ondansetron 4 MG disintegrating tablet Commonly known as: Zofran ODT 63m ODT q4 hours prn nausea/vomit   pantoprazole 20 MG tablet Commonly known as: PROTONIX Take 1 tablet (20 mg total) by mouth daily.   promethazine-dextromethorphan 6.25-15 MG/5ML syrup Commonly known as: PROMETHAZINE-DM Take 5 mLs by mouth 4 (four) times daily as needed for cough.   ranitidine 300 MG tablet Commonly known as: ZANTAC TAKE 1 TABLET (300 MG TOTAL) BY MOUTH AT BEDTIME.       Follow-up Information    BMosie Lukes MD. Schedule an appointment as soon as possible for a visit in 1 week(s).   Specialty: Family Medicine Contact information: [address] [place] [phone]          Major procedures and Radiology Reports - PLEASE review detailed and final reports thoroughly  -       CT Angio Chest PE W and/or Wo Contrast  Result Date: 09/23/2019 CLINICAL DATA:  Shortness of breath.  COVID-19 positive EXAM: CT ANGIOGRAPHY CHEST WITH CONTRAST TECHNIQUE: Multidetector CT imaging of the chest was performed using the standard protocol during bolus administration of intravenous contrast. Multiplanar CT image reconstructions and MIPs were obtained to evaluate the vascular anatomy. CONTRAST:  1088mOMNIPAQUE IOHEXOL 350 MG/ML SOLN COMPARISON:  Chest radiograph September 23, 2019 FINDINGS: Cardiovascular: There is no  demonstrable pulmonary embolus. There is no thoracic aortic aneurysm or dissection. The visualized great vessels appear normal. Note that the left vertebral artery arises directly from the aortic arch, an anatomic variant. There is no pericardial effusion or pericardial thickening. Mediastinum/Nodes: Thyroid appears unremarkable. There are subcentimeter axillary lymph nodes which do not meet size criteria for pathologic significance. There is a prominent lymph node to the left of the distal trachea measuring 1.3 x 1.1 cm. No other lymph node enlargement evident. There is a focal hiatal hernia. Lungs/Pleura: There is ground-glass type opacity in portions of the anterior and posterior segments of the right upper lobe. There is also an apparent area of ground-glass opacity in the inferior lingula. No pleural effusions are evident. There is slight bibasilar atelectasis. Upper Abdomen: Visualized upper abdominal structures appear unremarkable. Musculoskeletal: No blastic or lytic bone lesions. No chest wall lesions evident. Review of the MIP images confirms the above findings. IMPRESSION: 1. No pulmonary embolus. No thoracic aortic aneurysm or dissection. 2. Foci of ground-glass type opacity in the right upper lobe and inferior lingula, likely due to atypical organism pneumonia. 3. Mildly enlarged left paratracheal lymph node. Question reactive etiology given the underlying parenchymal lung changes. 4.  Focal hiatal hernia. Electronically Signed   By: WiLowella GripII M.D.   On: 09/23/2019 10:55   DG Chest Port 1 View  Result Date: 09/24/2019  CLINICAL DATA:  Shortness of breath. Chest pain and difficulty breathing. COVID positive 2 days ago. EXAM: PORTABLE CHEST 1 VIEW COMPARISON:  Radiograph and chest CTA yesterday. FINDINGS: Persistent low lung volumes with slightly improved aeration from radiograph yesterday. Patchy airspace opacity in the right suprahilar region corresponds to ground-glass opacities on CT.  Additional areas of ground-glass nodularity on CT are not seen radiographically. No pleural fluid or pneumothorax. Unchanged heart size and mediastinal contours. IMPRESSION: Slight worsening right perihilar airspace opacities since radiograph yesterday consistent with known COVID pneumonia. Electronically Signed   By: Keith Rake M.D.   On: 09/24/2019 20:31   DG Chest Portable 1 View  Result Date: 09/23/2019 CLINICAL DATA:  Dyspnea. Fever. EXAM: PORTABLE CHEST 1 VIEW COMPARISON:  Two-view chest x-ray 02/20/2011 FINDINGS: The heart size is exaggerate by low lung volumes. Bilateral hilar prominence likely represents pulmonary vascular congestion. No focal airspace disease is present. There is no edema or effusion. IMPRESSION: Low lung volumes and mild pulmonary vascular congestion. Electronically Signed   By: San Morelle M.D.   On: 09/23/2019 06:04   DG Abd Portable 2V  Result Date: 09/24/2019 CLINICAL DATA:  Abdominal pain. EXAM: PORTABLE ABDOMEN - 2 VIEW COMPARISON:  None. FINDINGS: The bowel gas pattern is normal. There is no evidence of free air. No radio-opaque calculi or other significant radiographic abnormality is seen. IMPRESSION: Negative. Electronically Signed   By: Virgina Norfolk M.D.   On: 09/24/2019 23:41    Micro Results     Recent Results (from the past 240 hour(s))  Respiratory Panel by RT PCR (Flu A&B, Covid) - Nasopharyngeal Swab     Status: Abnormal   Collection Time: 09/24/19  8:20 PM   Specimen: Nasopharyngeal Swab  Result Value Ref Range Status   SARS Coronavirus 2 by RT PCR POSITIVE (A) NEGATIVE Final    Comment: RESULT CALLED TO, READ BACK BY AND VERIFIED WITH: HONEYCUTT B, RN AT 2222 ON 09/24/2019 BY SAINVILUS S (NOTE) SARS-CoV-2 target nucleic acids are DETECTED. SARS-CoV-2 RNA is generally detectable in upper respiratory specimens  during the acute phase of infection. Positive results are indicative of the presence of the identified virus, but do  not rule out bacterial infection or co-infection with other pathogens not detected by the test. Clinical correlation with patient history and other diagnostic information is necessary to determine patient infection status. The expected result is Negative. Fact Sheet for Patients:  PinkCheek.be Fact Sheet for Healthcare Providers: GravelBags.it This test is not yet approved or cleared by the [place] FDA and  has been authorized for detection and/or diagnosis of SARS-CoV-2 by FDA under an Emergency Use Authorization (EUA).  This EUA will remain in effect (meaning this te st can be used) for the duration of  the COVID-19 declaration under Section 564(b)(1) of the Act, 21 U.S.C. section 360bbb-3(b)(1), unless the authorization is terminated or revoked sooner.    Influenza A by PCR NEGATIVE NEGATIVE Final   Influenza B by PCR NEGATIVE NEGATIVE Final    Comment: (NOTE) The Xpert Xpress SARS-CoV-2/FLU/RSV assay is intended as an aid in  the diagnosis of influenza from Nasopharyngeal swab specimens and  should not be used as a sole basis for treatment. Nasal washings and  aspirates are unacceptable for Xpert Xpress SARS-CoV-2/FLU/RSV  testing. Fact Sheet for Patients: PinkCheek.be Fact Sheet for Healthcare Providers: GravelBags.it This test is not yet approved or cleared by the [place] FDA and  has been authorized for detection and/or diagnosis of SARS-CoV-2 by  FDA under an Emergency Use Authorization (EUA). This EUA will remain  in effect (meaning this test can be used) for the duration of the  Covid-19 declaration under Section 564(b)(1) of the Act, 21  U.S.C. section 360bbb-3(b)(1), unless the authorization is  terminated or revoked. Performed at [facility], [place] [address] [place], [place] [address]   Culture, blood (Routine X 2) w Reflex to ID  Panel     Status: None (Preliminary result)   Collection Time: 09/25/19  2:02 AM   Specimen: BLOOD RIGHT HAND  Result Value Ref Range Status   Specimen Description BLOOD RIGHT HAND  Final   Special Requests   Final    BOTTLES DRAWN AEROBIC ONLY Blood Culture adequate volume   Culture   Final    NO GROWTH < 12 HOURS Performed at [facility], [place] [address], [place], [place] [address]    Report Status PENDING  Incomplete  Culture, blood (Routine X 2) w Reflex to ID Panel     Status: None (Preliminary result)   Collection Time: 09/25/19  2:02 AM   Specimen: BLOOD LEFT HAND  Result Value Ref Range Status   Specimen Description BLOOD LEFT HAND  Final   Special Requests   Final    BOTTLES DRAWN AEROBIC ONLY Blood Culture results may not be optimal due to an inadequate volume of blood received in culture bottles   Culture   Final    NO GROWTH < 12 HOURS Performed at [facility], [place] [address] [place] [address] [place], [place] [address]    Report Status PENDING  Incomplete    Today   Subjective    Melissa [name] today has no headache,no chest abdominal pain,no new weakness tingling or numbness, feels much better wants to go home today. Mild nausea no emesis.   Objective   Blood pressure 111/78, pulse 99, temperature 98.1 F (36.7 C), temperature source Oral, resp. rate 18, height 5' 2"  (1.575 m), weight 89.6 kg, last menstrual period 09/14/2019, SpO2 93 %.   Intake/Output Summary (Last 24 hours) at 09/25/2019 1223 Last data filed at 09/25/2019 0620 Gross per 24 hour  Intake 426.83 ml  Output --  Net 426.83 ml    Exam  Awake Alert,  No new F.N deficits, Normal affect Amelia.AT,PERRAL Supple Neck,No JVD, No cervical lymphadenopathy appriciated.  Symmetrical Chest wall movement, Good air movement bilaterally, CTAB RRR,No Gallops,Rubs or new Murmurs, No Parasternal Heave +ve B.Sounds, Abd Soft, Non tender, No organomegaly appriciated, No rebound -guarding or rigidity. No Cyanosis,  Clubbing or edema, No new Rash or bruise    Data Review   CBC w Diff:  Lab Results  Component Value Date   WBC 9.1 09/25/2019   HGB 11.3 (L) 09/25/2019   HCT 36.6 09/25/2019   PLT 291 09/25/2019   LYMPHOPCT 21 09/25/2019   MONOPCT 8 09/25/2019   EOSPCT 0 09/25/2019   BASOPCT 0 09/25/2019    CMP:  Lab Results  Component Value Date   NA 141 09/25/2019   K 3.8 09/25/2019   CL 108 09/25/2019   CO2 22 09/25/2019   BUN 11 09/25/2019   CREATININE 0.65 09/25/2019   CREATININE 0.68 07/11/2011   PROT 6.8 09/25/2019   ALBUMIN 3.1 (L) 09/25/2019   BILITOT 0.4 09/25/2019   ALKPHOS 67 09/25/2019   AST 16 09/25/2019   ALT 20 09/25/2019  .   Total Time in preparing paper work, data evaluation and todays exam - 35 minutes  Lala Lund M.D on 09/25/2019 at 12:23 PM  [facility]   Office  [phone]

## 2019-09-25 NOTE — Progress Notes (Signed)
Patient scheduled for outpatient Remdesivir infusion at 4:00 PM on Monday 1/25, Tuesday 1/26, Wednesday 1/27 and Thursday 1/28.  Please advise them to report to [facility] at [address]  Drive to the security guard and tell them you are here for an infusion. They will direct you to the front entrance where we will come and get you.  For questions call [phone].  Thanks

## 2019-09-26 ENCOUNTER — Ambulatory Visit (HOSPITAL_COMMUNITY)
Admission: RE | Admit: 2019-09-26 | Discharge: 2019-09-26 | Disposition: A | Discharge status: Home / Self Care | Payer: 59 | Source: Ambulatory Visit | Attending: Pulmonary Disease | Admitting: Pulmonary Disease

## 2019-09-26 ENCOUNTER — Telehealth: Payer: Self-pay | Admitting: *Deleted

## 2019-09-26 DIAGNOSIS — U071 COVID-19: Secondary | ICD-10-CM | POA: Diagnosis not present

## 2019-09-26 LAB — URINE CULTURE

## 2019-09-26 MED ORDER — SODIUM CHLORIDE 0.9 % IV SOLN
INTRAVENOUS | Status: AC
  Administered 2019-09-26: 100 mg via INTRAVENOUS
  Filled 2019-09-26: qty 20

## 2019-09-26 MED ORDER — SODIUM CHLORIDE 0.9 % IV SOLN: INTRAVENOUS | Status: DC | PRN

## 2019-09-26 MED ORDER — SODIUM CHLORIDE 0.9 % IV SOLN: 100.0000 mg | Freq: Once | INTRAVENOUS | Status: AC

## 2019-09-26 MED ORDER — METHYLPREDNISOLONE SODIUM SUCC 125 MG IJ SOLR: 125.0000 mg | Freq: Once | INTRAMUSCULAR | Status: DC | PRN

## 2019-09-26 MED ORDER — EPINEPHRINE 0.3 MG/0.3ML IJ SOAJ: 0.3000 mg | Freq: Once | INTRAMUSCULAR | Status: DC | PRN

## 2019-09-26 MED ORDER — FAMOTIDINE IN NACL 20-0.9 MG/50ML-% IV SOLN: 20.0000 mg | Freq: Once | INTRAVENOUS | Status: DC | PRN

## 2019-09-26 MED ORDER — DIPHENHYDRAMINE HCL 50 MG/ML IJ SOLN: 50.0000 mg | Freq: Once | INTRAMUSCULAR | Status: DC | PRN

## 2019-09-26 MED ORDER — ALBUTEROL SULFATE HFA 108 (90 BASE) MCG/ACT IN AERS: 2.0000 | INHALATION_SPRAY | Freq: Once | RESPIRATORY_TRACT | Status: DC | PRN

## 2019-09-26 NOTE — Telephone Encounter (Signed)
Transition Care Management Follow-up Telephone Call   Date discharged? 09/25/19   How have you been since you were released from the hospital? "Im doing better and breathing fine"   Do you understand why you were in the hospital? yes   Do you understand the discharge instructions? yes   Where were you discharged to? home   Items Reviewed:  Medications reviewed: yes  Allergies reviewed: yes  Dietary changes reviewed: yes  Referrals reviewed: yes   Functional Questionnaire:   Activities of Daily Living (ADLs):   She states they are independent in the following: ambulation, bathing and hygiene, feeding, continence, grooming, toileting and dressing States they require assistance with the following: na   Any transportation issues/concerns?: no   Any patient concerns? no   Confirmed importance and date/time of follow-up visits scheduled yes  Provider Appointment booked with PCP 09/27/19  Confirmed with patient if condition begins to worsen call PCP or go to the ER.  Patient was given the office number and encouraged to call back with question or concerns.  : yes

## 2019-09-26 NOTE — Progress Notes (Addendum)
  Diagnosis: COVID-19  Physician:dr wright   Procedure: [facility]id Infusion Clinic Med: remdesivir infusion.  Complications: No immediate complications noted.  Discharge: Discharged home   [place] 09/26/2019

## 2019-09-27 ENCOUNTER — Encounter: Payer: Self-pay | Admitting: Family

## 2019-09-27 ENCOUNTER — Ambulatory Visit (HOSPITAL_COMMUNITY)
Admit: 2019-09-27 | Discharge: 2019-09-27 | Disposition: A | Discharge status: Home / Self Care | Payer: 59 | Attending: Pulmonary Disease | Admitting: Pulmonary Disease

## 2019-09-27 ENCOUNTER — Other Ambulatory Visit: Payer: Self-pay

## 2019-09-27 ENCOUNTER — Emergency Department (HOSPITAL_COMMUNITY): Payer: 59

## 2019-09-27 ENCOUNTER — Encounter (HOSPITAL_COMMUNITY): Payer: Self-pay

## 2019-09-27 ENCOUNTER — Ambulatory Visit: Payer: Self-pay | Admitting: Family

## 2019-09-27 ENCOUNTER — Emergency Department (HOSPITAL_COMMUNITY)
Admission: EM | Admit: 2019-09-27 | Discharge: 2019-09-27 | Disposition: A | Discharge status: Home / Self Care | Payer: 59 | Attending: Emergency Medicine | Admitting: Emergency Medicine

## 2019-09-27 DIAGNOSIS — J45909 Unspecified asthma, uncomplicated: Secondary | ICD-10-CM | POA: Insufficient documentation

## 2019-09-27 DIAGNOSIS — Z79899 Other long term (current) drug therapy: Secondary | ICD-10-CM | POA: Insufficient documentation

## 2019-09-27 DIAGNOSIS — U071 COVID-19: Secondary | ICD-10-CM | POA: Diagnosis not present

## 2019-09-27 DIAGNOSIS — R0789 Other chest pain: Secondary | ICD-10-CM | POA: Diagnosis present

## 2019-09-27 LAB — CBC WITH DIFFERENTIAL/PLATELET
Abs Immature Granulocytes: 0.14 10*3/uL — ABNORMAL HIGH (ref 0.00–0.07)
Basophils Absolute: 0.1 10*3/uL (ref 0.0–0.1)
Basophils Relative: 0 %
Eosinophils Absolute: 0 10*3/uL (ref 0.0–0.5)
Eosinophils Relative: 0 %
HCT: 41.4 % (ref 36.0–46.0)
Hemoglobin: 13.1 g/dL (ref 12.0–15.0)
Immature Granulocytes: 1 %
Lymphocytes Relative: 30 %
Lymphs Abs: 4.5 10*3/uL — ABNORMAL HIGH (ref 0.7–4.0)
MCH: 24.8 pg — ABNORMAL LOW (ref 26.0–34.0)
MCHC: 31.6 g/dL (ref 30.0–36.0)
MCV: 78.3 fL — ABNORMAL LOW (ref 80.0–100.0)
Monocytes Absolute: 0.4 10*3/uL (ref 0.1–1.0)
Monocytes Relative: 2 %
Neutro Abs: 9.7 10*3/uL — ABNORMAL HIGH (ref 1.7–7.7)
Neutrophils Relative %: 67 %
Platelets: 412 10*3/uL — ABNORMAL HIGH (ref 150–400)
RBC: 5.29 MIL/uL — ABNORMAL HIGH (ref 3.87–5.11)
RDW: 17 % — ABNORMAL HIGH (ref 11.5–15.5)
WBC: 14.8 10*3/uL — ABNORMAL HIGH (ref 4.0–10.5)
nRBC: 0 % (ref 0.0–0.2)

## 2019-09-27 LAB — COMPREHENSIVE METABOLIC PANEL
ALT: 20 U/L (ref 0–44)
AST: 17 U/L (ref 15–41)
Albumin: 3.4 g/dL — ABNORMAL LOW (ref 3.5–5.0)
Alkaline Phosphatase: 78 U/L (ref 38–126)
Anion gap: 11 (ref 5–15)
BUN: 13 mg/dL (ref 6–20)
CO2: 22 mmol/L (ref 22–32)
Calcium: 8.3 mg/dL — ABNORMAL LOW (ref 8.9–10.3)
Chloride: 104 mmol/L (ref 98–111)
Creatinine, Ser: 0.9 mg/dL (ref 0.44–1.00)
GFR calc Af Amer: >60 mL/min (ref >60)
GFR calc non Af Amer: >60 mL/min (ref >60)
Glucose, Bld: 95 mg/dL (ref 70–99)
Potassium: 3.6 mmol/L (ref 3.5–5.1)
Sodium: 137 mmol/L (ref 135–145)
Total Bilirubin: 0.7 mg/dL (ref 0.3–1.2)
Total Protein: 7.8 g/dL (ref 6.5–8.1)

## 2019-09-27 LAB — I-STAT BETA HCG BLOOD, ED (MC, WL, AP ONLY): I-stat hCG, quantitative: <5 m[IU]/mL (ref <5)

## 2019-09-27 LAB — LIPASE, BLOOD: Lipase: 33 U/L (ref 11–51)

## 2019-09-27 MED ORDER — EPINEPHRINE 0.3 MG/0.3ML IJ SOAJ: 0.3000 mg | Freq: Once | INTRAMUSCULAR | Status: DC | PRN

## 2019-09-27 MED ORDER — PANTOPRAZOLE SODIUM 40 MG IV SOLR
40.0000 mg | Freq: Once | INTRAVENOUS | Status: AC
  Administered 2019-09-27: 10:00:00 40 mg via INTRAVENOUS
  Filled 2019-09-27: qty 40

## 2019-09-27 MED ORDER — PROMETHAZINE HCL 25 MG/ML IJ SOLN
25.0000 mg | Freq: Once | INTRAMUSCULAR | Status: AC
  Administered 2019-09-27: 25 mg via INTRAVENOUS
  Filled 2019-09-27: qty 1

## 2019-09-27 MED ORDER — HYDROXYZINE HCL 25 MG PO TABS: 25.0000 mg | ORAL_TABLET | Freq: Four times a day (QID) | ORAL | 0 refills | Status: DC

## 2019-09-27 MED ORDER — METHYLPREDNISOLONE SODIUM SUCC 125 MG IJ SOLR: 125.0000 mg | Freq: Once | INTRAMUSCULAR | Status: DC | PRN

## 2019-09-27 MED ORDER — SODIUM CHLORIDE 0.9 % IV SOLN
INTRAVENOUS | Status: AC
  Filled 2019-09-27: qty 20

## 2019-09-27 MED ORDER — ONDANSETRON HCL 4 MG/2ML IJ SOLN
4.0000 mg | Freq: Once | INTRAMUSCULAR | Status: AC
  Administered 2019-09-27: 10:00:00 4 mg via INTRAVENOUS
  Filled 2019-09-27: qty 2

## 2019-09-27 MED ORDER — HYDROXYZINE HCL 25 MG PO TABS
25.0000 mg | ORAL_TABLET | Freq: Once | ORAL | Status: AC
  Administered 2019-09-27: 10:00:00 25 mg via ORAL
  Filled 2019-09-27: qty 1

## 2019-09-27 MED ORDER — ONDANSETRON 4 MG PO TBDP: 4.0000 mg | ORAL_TABLET | Freq: Three times a day (TID) | ORAL | 0 refills | Status: DC | PRN

## 2019-09-27 MED ORDER — FAMOTIDINE IN NACL 20-0.9 MG/50ML-% IV SOLN: 20.0000 mg | Freq: Once | INTRAVENOUS | Status: DC | PRN

## 2019-09-27 MED ORDER — ALBUTEROL SULFATE HFA 108 (90 BASE) MCG/ACT IN AERS: 2.0000 | INHALATION_SPRAY | Freq: Once | RESPIRATORY_TRACT | Status: DC | PRN

## 2019-09-27 MED ORDER — SODIUM CHLORIDE 0.9 % IV SOLN
100.0000 mg | Freq: Once | INTRAVENOUS | Status: AC
  Administered 2019-09-27: 100 mg via INTRAVENOUS

## 2019-09-27 MED ORDER — SODIUM CHLORIDE 0.9 % IV SOLN
INTRAVENOUS | Status: DC | PRN
  Administered 2019-09-27: 250 mL via INTRAVENOUS

## 2019-09-27 MED ORDER — DIPHENHYDRAMINE HCL 50 MG/ML IJ SOLN: 50.0000 mg | Freq: Once | INTRAMUSCULAR | Status: DC | PRN

## 2019-09-27 NOTE — Progress Notes (Signed)
Reviewed pt chart- pt found to be in the ED at time of her appointment.

## 2019-09-27 NOTE — ED Provider Notes (Signed)
[facility]PART[facility] Provider Note   CSN: [id number] Arrival date & time: 09/27/19  [id number]     History Chief Complaint  Patient presents with  . COVID +  . Shortness of Breath  . Chest Pain    Melissa [name] is a 29 y.o. female with PMH significant for asthma, obesity with BMI of 36, and recent discharge from hospital 09/24/2019 after being admitted for COVID-19 related nausea and vomiting.  She was provided IV infusions of remdesivir and steroids and then discharged with oral steroid taper, continued outpatient remdesivir infusions, and Zofran ODT as needed.  CT angio chest obtained demonstrated opacities concerning for COVID-19 pneumonia, but no evidence of PE.  She was given Rocephin and Keflex for possible UTI.  She presents to the ED today after checking her pulse oximetry at home and seeing 82%.  She also reports that she was experiencing central, nonradiating, substernal chest discomfort, however she attributes it to her anxiety as well as her GERD.  She simply want to come to the ED to ensure that she was not hypoxic.  She also states that she has not been regularly eating or drinking, even since her discharge.  She is asking about Ativan she received in the hospital which helped with her anxiety.  She denies any uncontrolled fevers or chills, headache or dizziness, vomiting, or changes in bowel or urinary habits.  She states that she has not been taking all her medications at home due to her ongoing nausea symptoms that are only partially alleviated with her Zofran ODT.   HPI     Past Medical History:  Diagnosis Date  . Abnormal Pap smear of cervix 07/11/2011  . Acid reflux   . Anxiety   . Asthma    activity induced  . Irregular menses   . Polycystic ovarian syndrome     Patient Active Problem List   Diagnosis Date Noted  . Pneumonia due to COVID-19 virus 09/25/2019  . Dehydration 09/25/2019  . Morbid obesity ([place]) 01/31/2016  . 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 Acanthosis nigricans 09/12/2014  . Snoring 09/12/2014  . Oligo-ovulation 07/04/2014  . Fall from balcony 03/14/2014  . Nausea with vomiting 11/21/2013  . Sinusitis 11/21/2013  . Nevus 02/11/2013  . GERD (gastroesophageal reflux disease) 02/11/2013  . Epigastric pain 07/11/2011  . PCOS (polycystic ovarian syndrome) 07/11/2011  . Abnormal Pap smear of cervix 07/11/2011  . Depression 07/11/2011  . Asthma 07/11/2011  . Weight gain 07/11/2011    Past Surgical History:  Procedure Laterality Date  . NO PAST SURGERIES       OB History   No obstetric history on file.     Family History  Problem Relation Age of Onset  . Gallbladder disease Mother   . Colon polyps Mother   . Hypertension Father   . Other Father        Hepatitis A  . Stomach cancer Maternal Grandmother   . Diabetes Maternal Grandmother   . Colon polyps Maternal Grandmother   . Emphysema Maternal Grandfather   . Heart disease Paternal Grandfather   . Breast cancer Other        mat great aunts x 2  . Colon cancer Other        mat great uncles x 2  . Irritable bowel syndrome Sister     Social History   Tobacco Use  . Smoking status: Never Smoker  . Smokeless tobacco: Never Used  Substance Use Topics  .  Alcohol use: Yes    Alcohol/week: 4.0 - 7.0 standard drinks    Types: 4 - 7 Cans of beer per week    Comment: 1 beer every other ay  . Drug use: No    Home Medications Prior to Admission medications   Medication Sig Start Date End Date Taking? Authorizing Provider  albuterol (VENTOLIN HFA) 108 (90 Base) MCG/ACT inhaler Inhale 1-2 puffs into the lungs every 6 (six) hours as needed for wheezing or shortness of breath. 09/20/19  Yes Fawze, Mina A, PA-C  diphenhydramine-acetaminophen (TYLENOL PM) 25-500 MG TABS tablet Take 1 tablet by mouth at bedtime as needed (sleep).   Yes [provider]  ibuprofen (ADVIL) 800 MG tablet Take 800 mg by mouth 3 (three) times daily as  needed for fever or moderate pain.  09/26/19  Yes [provider]  methylPREDNISolone (MEDROL DOSEPAK) 4 MG TBPK tablet follow package directions Patient taking differently: Take 4-24 mg by mouth as directed. Take 6 tablets by mouth on Day 1, then decrease by 1 tablet daily until gone (6.5.4.3.2.1.STOP) 09/25/19  Yes Thurnell Lose, MD  pantoprazole (PROTONIX) 20 MG tablet Take 1 tablet (20 mg total) by mouth daily. 09/23/19 10/23/19 Yes Alveria Apley, PA-C  promethazine-dextromethorphan (PROMETHAZINE-DM) 6.25-15 MG/5ML syrup Take 5 mLs by mouth 4 (four) times daily as needed for cough. 09/22/19  Yes Muthersbaugh, Jarrett Soho, PA-C  benzonatate (TESSALON) 100 MG capsule Take 1 capsule (100 mg total) by mouth every 8 (eight) hours. Patient not taking: Reported on 09/26/2019 09/23/19   Madilyn Hook A, PA-C  cephALEXin (KEFLEX) 500 MG capsule Take 1 capsule (500 mg total) by mouth 3 (three) times daily for 2 days. 09/25/19 09/27/19  Thurnell Lose, MD  ondansetron (ZOFRAN ODT) 4 MG disintegrating tablet Take 1 tablet (4 mg total) by mouth every 8 (eight) hours as needed for nausea or vomiting. 09/27/19   Corena Herter, PA-C  ranitidine (ZANTAC) 300 MG tablet TAKE 1 TABLET (300 MG TOTAL) BY MOUTH AT BEDTIME. Patient not taking: Reported on 09/27/2019 03/13/17   Copland, Gay Filler, MD    Allergies    Benadryl [diphenhydramine hcl]  Review of Systems   Review of Systems  Constitutional: Negative for chills and fever.  Respiratory: Positive for shortness of breath.   Cardiovascular: Negative for chest pain.  Gastrointestinal: Positive for nausea.  Neurological: Negative for dizziness.    Physical Exam Updated Vital Signs BP 107/74   Pulse 100   Temp 99.9 F (37.7 C) (Oral)   Resp 14   Ht 5\' 2"  (1.575 m)   Wt 99.8 kg   LMP 09/14/2019   SpO2 94%   BMI 40.24 kg/m   Physical Exam Vitals and nursing note reviewed. Exam conducted with a chaperone present.  Constitutional:       Appearance: Normal appearance.  HENT:     Head: Normocephalic and atraumatic.  Eyes:     General: No scleral icterus.    Conjunctiva/sclera: Conjunctivae normal.  Cardiovascular:     Rate and Rhythm: Regular rhythm. Tachycardia present.     Pulses: Normal pulses.     Heart sounds: Normal heart sounds.  Pulmonary:     Effort: Pulmonary effort is normal. No respiratory distress.     Breath sounds: Normal breath sounds. No wheezing or rales.     Comments: No increased work of breathing.  No accessory muscle use. Abdominal:     General: Abdomen is flat. There is no distension.  Palpations: Abdomen is soft.     Tenderness: There is no abdominal tenderness. There is no guarding.  Musculoskeletal:     Cervical back: Normal range of motion and neck supple. No rigidity or tenderness.  Skin:    General: Skin is dry.     Capillary Refill: Capillary refill takes less than 2 seconds.  Neurological:     General: No focal deficit present.     Mental Status: She is alert and oriented to person, place, and time.     GCS: GCS eye subscore is 4. GCS verbal subscore is 5. GCS motor subscore is 6.     Cranial Nerves: No cranial nerve deficit.     Sensory: No sensory deficit.     Motor: No weakness.     Gait: Gait normal.  Psychiatric:        Mood and Affect: Mood normal.        Behavior: Behavior normal.        Thought Content: Thought content normal.     ED Results / Procedures / Treatments   Labs (all labs ordered are listed, but only abnormal results are displayed) Labs Reviewed  CBC WITH DIFFERENTIAL/PLATELET - Abnormal; Notable for the following components:      Result Value   WBC 14.8 (*)    RBC 5.29 (*)    MCV 78.3 (*)    MCH 24.8 (*)    RDW 17.0 (*)    Platelets 412 (*)    Neutro Abs 9.7 (*)    Lymphs Abs 4.5 (*)    Abs Immature Granulocytes 0.14 (*)    All other components within normal limits  COMPREHENSIVE METABOLIC PANEL - Abnormal; Notable for the following  components:   Calcium 8.3 (*)    Albumin 3.4 (*)    All other components within normal limits  LIPASE, BLOOD  I-STAT BETA HCG BLOOD, ED ([facility], [facility], [facility] ONLY)    EKG EKG Interpretation  Date/Time:  Tuesday September 27 2019 09:07:11 EST Ventricular Rate:  122 PR Interval:    QRS Duration: 69 QT Interval:  290 QTC Calculation: 414 R Axis:   36 Text Interpretation: Sinus tachycardia Atrial premature complex Consider right atrial enlargement Borderline T abnormalities, diffuse leads No significant change since 1/21 Confirmed by Aletta Edouard [phone]) on 09/27/2019 9:09:34 AM   Radiology DG Chest Portable 1 View  Result Date: 09/27/2019 CLINICAL DATA:  Shortness of breath EXAM: PORTABLE CHEST 1 VIEW COMPARISON:  09/24/2019 FINDINGS: Patchy increased density in the right upper lung. No pleural effusion or pneumothorax. Stable cardiomediastinal contours. IMPRESSION: Patchy atelectasis/consolidation in the right upper lung. Electronically Signed   By: Macy Mis M.D.   On: 09/27/2019 10:30    Procedures Procedures (including critical care time)  Medications Ordered in ED Medications  hydrOXYzine (ATARAX/VISTARIL) tablet 25 mg (25 mg Oral Given 09/27/19 1018)  pantoprazole (PROTONIX) injection 40 mg (40 mg Intravenous Given 09/27/19 1020)  ondansetron (ZOFRAN) injection 4 mg (4 mg Intravenous Given 09/27/19 1019)  promethazine (PHENERGAN) injection 25 mg (25 mg Intravenous Given 09/27/19 1302)    ED Course  I have reviewed the triage vital signs and the nursing notes.  Pertinent labs & imaging results that were available during my care of the patient were reviewed by me and considered in my medical decision making (see chart for details).    MDM Rules/Calculators/A&P                      DG  chest demonstrates atelectasis versus consolidation right upper lung.  CMP demonstrates no electrolyte normalities.  Patient's heart rate and tachypnea improved with administration of hydroxyzine.   Lipase normal.  CBC demonstrates new leukocytosis, but likely attributable to steroids.  Patient's tachypnea has almost entirely resolved, however she continues to be tachycardic between 120s and 130s.  I reviewed her recent encounters and she has persistently been in this range.  I suspect her albuterol and prednisone may also be contributing to her elevated heart rate.  She is likely dehydrated due to her diminished p.o. intake, however does not appear to be clinically dehydrated.  On reexamination, patient is feeling improved.  She is having water without difficulty.  I would like to see patient eat crackers or applesauce before discharge.  She expressed desire to return home and is reassured by today's work-up.  On final reexamination, patient's heart rate was down around 90 and she was no longer tachypneic.  She is feeling improved since she first arrived this morning.  She has outpatient infusion scheduled for 4 PM and would like to leave the ER that she may attend.  At this time, given her recent negative CTA in addition to lack of any new leg swelling, pleuritic chest discomfort, or pleuritic cough, discussed with Dr. Melina Copa and do not feel as though repeat CTA chest warranted at this time to evaluate for PE.   Very strict return precautions discussed with patient.  All of the evaluation and work-up results were discussed with the patient and any family at bedside. They were provided opportunity to ask any additional questions and have none at this time. They have expressed understanding of verbal discharge instructions as well as return precautions and are agreeable to the plan.   Tiphani Villasana was evaluated in Emergency Department on 09/27/2019 for the symptoms described in the history of present illness. She was evaluated in the context of the global COVID-19 pandemic, which necessitated consideration that the patient might be at risk for infection with the SARS-CoV-2 virus that causes COVID-19.  Institutional protocols and algorithms that pertain to the evaluation of patients at risk for COVID-19 are in a state of rapid change based on information released by regulatory bodies including the CDC and federal and state organizations. These policies and algorithms were followed during the patient's care in the ED.   Final Clinical Impression(s) / ED Diagnoses Final diagnoses:  [id number]    Rx / DC Orders ED Discharge Orders         Ordered    ondansetron (ZOFRAN ODT) 4 MG disintegrating tablet  Every 8 hours PRN     09/27/19 1501           Reita Chard 09/27/19 1503    Hayden Rasmussen, MD 09/27/19 1921

## 2019-09-27 NOTE — ED Triage Notes (Signed)
Pt tested positive for COVID 3 days ago. States she has had worsening SOB and chest pain this morning when she was trying to take a shower, checked her pulse ox at home and it read 82%. Pt also reports nausea and vomiting for the past week. Took 1 ODT zofran and 2 puffs of albuterol PTA. Pt a.o, able to talk in complete sentences.

## 2019-09-27 NOTE — ED Notes (Signed)
Pt reports nausea after attempting PO fluids, EDP notified, plans to collect istat beta hcg and provide phenergan if appropriate

## 2019-09-27 NOTE — ED Notes (Signed)
Patient verbalizes understanding of discharge instructions. Opportunity for questioning and answers were provided. Armband removed by staff, pt discharged from ED to home 

## 2019-09-27 NOTE — Progress Notes (Signed)
  Diagnosis: COVID-19  Physician:DR Joya Gaskins  Procedure: [facility] Med: remdesivir infusion.  Complications: No immediate complications noted.  Discharge: Discharged home   [employer], [place] 09/27/2019

## 2019-09-27 NOTE — ED Notes (Signed)
Pt c.o lower back pain that radiates down her legs, pt sitting on the side of the bed for comfort.

## 2019-09-27 NOTE — Discharge Instructions (Signed)
Please continue to monitor your pulse oximetry at home.  Please go to your infusions, as scheduled.  I have represcribed your Zofran.  Please continue take, as needed.  It is important that you continue to eat and drink regular meals.  If you cannot eat, I recommend Ensure or a similar nutrition packed drink.  Please return to the ED or seek immediate medical attention for any uncontrolled nausea or vomiting, inability to eat or drink, worsening shortness of breath or chest discomfort, or any other new or worsening symptoms.

## 2019-09-28 ENCOUNTER — Telehealth: Payer: MEDICAID | Admitting: Family

## 2019-09-28 ENCOUNTER — Ambulatory Visit (HOSPITAL_COMMUNITY)
Admit: 2019-09-28 | Discharge: 2019-09-28 | Disposition: A | Discharge status: Home / Self Care | Payer: 59 | Attending: Pulmonary Disease | Admitting: Pulmonary Disease

## 2019-09-28 DIAGNOSIS — U071 COVID-19: Secondary | ICD-10-CM | POA: Diagnosis not present

## 2019-09-28 DIAGNOSIS — R110 Nausea: Secondary | ICD-10-CM

## 2019-09-28 DIAGNOSIS — J1282 Pneumonia due to coronavirus disease 2019: Secondary | ICD-10-CM

## 2019-09-28 MED ORDER — METHYLPREDNISOLONE SODIUM SUCC 125 MG IJ SOLR: 125.0000 mg | Freq: Once | INTRAMUSCULAR | Status: DC | PRN

## 2019-09-28 MED ORDER — DIPHENHYDRAMINE HCL 50 MG/ML IJ SOLN: 50.0000 mg | Freq: Once | INTRAMUSCULAR | Status: DC | PRN

## 2019-09-28 MED ORDER — EPINEPHRINE 0.3 MG/0.3ML IJ SOAJ: 0.3000 mg | Freq: Once | INTRAMUSCULAR | Status: DC | PRN

## 2019-09-28 MED ORDER — SODIUM CHLORIDE 0.9 % IV SOLN
INTRAVENOUS | Status: AC
  Administered 2019-09-28: 100 mg via INTRAVENOUS
  Filled 2019-09-28: qty 20

## 2019-09-28 MED ORDER — FAMOTIDINE IN NACL 20-0.9 MG/50ML-% IV SOLN: 20.0000 mg | Freq: Once | INTRAVENOUS | Status: DC | PRN

## 2019-09-28 MED ORDER — ALBUTEROL SULFATE HFA 108 (90 BASE) MCG/ACT IN AERS: 2.0000 | INHALATION_SPRAY | Freq: Once | RESPIRATORY_TRACT | Status: DC | PRN

## 2019-09-28 MED ORDER — PROMETHAZINE HCL 25 MG PO TABS: 25.0000 mg | ORAL_TABLET | Freq: Three times a day (TID) | ORAL | 0 refills | Status: DC | PRN

## 2019-09-28 MED ORDER — SODIUM CHLORIDE 0.9 % IV SOLN: 100.0000 mg | Freq: Once | INTRAVENOUS | Status: AC

## 2019-09-28 MED ORDER — SODIUM CHLORIDE 0.9 % IV SOLN: INTRAVENOUS | Status: DC | PRN

## 2019-09-28 NOTE — Progress Notes (Signed)
We are sorry that you are not feeling well. Here is how we plan to help!  I will go ahead and given you phenergan. However, I have reviewed your ED note and want you to make a follow up appt with your PCP or Urgent care. If your symptoms worsen or do not improve over the next few hours you need to be seen face to face.    I have prescribed a medication that will help alleviate your symptoms and allow you to stay hydrated:  Promethazine 25 mg take 1 tablet twice daily  HOME CARE:  Drink clear liquids.  This is very important! Dehydration (the lack of fluid) can lead to a serious complication.  Start off with 1 tablespoon every 5 minutes for 8 hours.  You may begin eating bland foods after 8 hours without vomiting.  Start with saltine crackers, white bread, rice, mashed potatoes, applesauce.  After 48 hours on a bland diet, you may resume a normal diet.  Try to go to sleep.  Sleep often empties the stomach and relieves the need to vomit.  GET HELP RIGHT AWAY IF:   Your symptoms do not improve or worsen within 2 days after treatment.  You have a fever for over 3 days.  You cannot keep down fluids after trying the medication.  MAKE SURE YOU:   Understand these instructions.  Will watch your condition.  Will get help right away if you are not doing well or get worse.   Thank you for choosing an e-visit. Your e-visit answers were reviewed by a board certified advanced clinical practitioner to complete your personal care plan. Depending upon the condition, your plan could have included both over the counter or prescription medications. Please review your pharmacy choice. Be sure that the pharmacy you have chosen is open so that you can pick up your prescription now.  If there is a problem you may message your provider in [place] to have the prescription routed to another pharmacy. Your safety is important to [place]. If you have drug allergies check your prescription carefully.  For the  next 24 hours, you can use MyChart to ask questions about today's visit, request a non-urgent call back, or ask for a work or school excuse from your e-visit provider. You will get an e-mail in the next two days asking about your experience. I hope that your e-visit has been valuable and will speed your recovery.  Approximately 5 minutes was spent documenting and reviewing patient's chart.

## 2019-09-28 NOTE — Progress Notes (Signed)
  Diagnosis: COVID-19  Physician: Dr. Penni Homans  Procedure: [facility] Med: remdesivir infusion.  Complications: No immediate complications noted.  Discharge: Discharged home   Melissa [name] L 09/28/2019

## 2019-09-28 NOTE — Progress Notes (Signed)
  Diagnosis: COVID-19  Physician: Dr. Joya Gaskins Procedure: [facility] Med: remdesivir infusion.  Complications: No immediate complications noted.  Discharge: Discharged home   Gaye Alken 09/28/2019

## 2019-09-29 ENCOUNTER — Ambulatory Visit (HOSPITAL_COMMUNITY)
Admit: 2019-09-29 | Discharge: 2019-09-29 | Disposition: A | Discharge status: Home / Self Care | Payer: 59 | Attending: Pulmonary Disease | Admitting: Pulmonary Disease

## 2019-09-29 DIAGNOSIS — U071 COVID-19: Secondary | ICD-10-CM | POA: Diagnosis not present

## 2019-09-29 MED ORDER — ALBUTEROL SULFATE HFA 108 (90 BASE) MCG/ACT IN AERS: 2.0000 | INHALATION_SPRAY | Freq: Once | RESPIRATORY_TRACT | Status: DC | PRN

## 2019-09-29 MED ORDER — FAMOTIDINE IN NACL 20-0.9 MG/50ML-% IV SOLN: 20.0000 mg | Freq: Once | INTRAVENOUS | Status: DC | PRN

## 2019-09-29 MED ORDER — SODIUM CHLORIDE 0.9 % IV SOLN: INTRAVENOUS | Status: DC | PRN

## 2019-09-29 MED ORDER — DIPHENHYDRAMINE HCL 50 MG/ML IJ SOLN: 50.0000 mg | Freq: Once | INTRAMUSCULAR | Status: DC | PRN

## 2019-09-29 MED ORDER — SODIUM CHLORIDE 0.9 % IV SOLN
100.0000 mg | Freq: Once | INTRAVENOUS | Status: AC
  Administered 2019-09-29: 100 mg via INTRAVENOUS

## 2019-09-29 MED ORDER — METHYLPREDNISOLONE SODIUM SUCC 125 MG IJ SOLR: 125.0000 mg | Freq: Once | INTRAMUSCULAR | Status: DC | PRN

## 2019-09-29 MED ORDER — SODIUM CHLORIDE 0.9 % IV SOLN
INTRAVENOUS | Status: AC
  Filled 2019-09-29: qty 20

## 2019-09-29 MED ORDER — EPINEPHRINE 0.3 MG/0.3ML IJ SOAJ: 0.3000 mg | Freq: Once | INTRAMUSCULAR | Status: DC | PRN

## 2019-09-29 NOTE — Discharge Instructions (Signed)
COVID-19 COVID-19 is a respiratory infection that is caused by a virus called severe acute respiratory syndrome coronavirus 2 (SARS-CoV-2). The disease is also known as coronavirus disease or novel coronavirus. In some people, the virus may not cause any symptoms. In others, it may cause a serious infection. The infection can get worse quickly and can lead to complications, such as:  Pneumonia, or infection of the lungs.  Acute respiratory distress syndrome or ARDS. This is a condition in which fluid build-up in the lungs prevents the lungs from filling with air and passing oxygen into the blood.  Acute respiratory failure. This is a condition in which there is not enough oxygen passing from the lungs to the body or when carbon dioxide is not passing from the lungs out of the body.  Sepsis or septic shock. This is a serious bodily reaction to an infection.  Blood clotting problems.  Secondary infections due to bacteria or fungus.  Organ failure. This is when your body's organs stop working. The virus that causes COVID-19 is contagious. This means that it can spread from person to person through droplets from coughs and sneezes (respiratory secretions). What are the causes? This illness is caused by a virus. You may catch the virus by:   .ba  Breathing in droplets from an infected person. Droplets can be spread by a person breathing, speaking, singing, coughing, or sneezing.  Touching something, like a table or a doorknob, that was exposed to the virus (contaminated) and then touching your mouth, nose, or eyes. What increases the risk? Risk for infection You are more likely to be infected with this virus if you:  Are within 6 feet (2 meters) of a person with COVID-19.  Provide care for or live with a person who is infected with COVID-19.  Spend time in crowded indoor spaces or live in shared housing. Risk for serious illness You are more likely to become seriously ill from the  virus if you:  Are 4 years of age or older. The higher your age, the more you are at risk for serious illness.  Live in a nursing home or long-term care facility.  Have cancer.  Have a long-term (chronic) disease such as: ? Chronic lung disease, including chronic obstructive pulmonary disease or asthma. ? A long-term disease that lowers your body's ability to fight infection (immunocompromised). ? Heart disease, including heart failure, a condition in which the arteries that lead to the heart become narrow or blocked (coronary artery disease), a disease which makes the heart muscle thick, weak, or stiff (cardiomyopathy). ? Diabetes. ? Chronic kidney disease. ? Sickle cell disease, a condition in which red blood cells have an abnormal "sickle" shape. ? Liver disease.  Are obese. What are the signs or symptoms? Symptoms of this condition can range from mild to severe. Symptoms may appear any time from 2 to 14 days after being exposed to the virus. They include:  A fever or chills.  A cough.  Difficulty breathing.  Headaches, body aches, or muscle aches.  Runny or stuffy (congested) nose.  A sore throat.  New loss of taste or smell. Some people may also have stomach problems, such as nausea, vomiting, or diarrhea. Other people may not have any symptoms of COVID-19. How is this diagnosed? This condition may be diagnosed based on:  Your signs and symptoms, especially if: ? You live in an area with a COVID-19 outbreak. ? You recently traveled to or from an area where the virus is  common. ? You provide care for or live with a person who was diagnosed with COVID-19. ? You were exposed to a person who was diagnosed with COVID-19.  A physical exam.  Lab tests, which may include: ? Taking a sample of fluid from the back of your nose and throat (nasopharyngeal fluid), your nose, or your throat using a swab. ? A sample of mucus from your lungs (sputum). ? Blood tests.  Imaging  tests, which may include, X-rays, CT scan, or ultrasound. How is this treated? At present, there is no medicine to treat COVID-19. Medicines that treat other diseases are being used on a trial basis to see if they are effective against COVID-19. Your health care provider will talk with you about ways to treat your symptoms. For most people, the infection is mild and can be managed at home with rest, fluids, and over-the-counter medicines. Treatment for a serious infection usually takes places in a hospital intensive care unit (ICU). It may include one or more of the following treatments. These treatments are given until your symptoms improve.  Receiving fluids and medicines through an IV.  Supplemental oxygen. Extra oxygen is given through a tube in the nose, a face mask, or a hood.  Positioning you to lie on your stomach (prone position). This makes it easier for oxygen to get into the lungs.  Continuous positive airway pressure (CPAP) or bi-level positive airway pressure (BPAP) machine. This treatment uses mild air pressure to keep the airways open. A tube that is connected to a motor delivers oxygen to the body.  Ventilator. This treatment moves air into and out of the lungs by using a tube that is placed in your windpipe.  Tracheostomy. This is a procedure to create a hole in the neck so that a breathing tube can be inserted.  Extracorporeal membrane oxygenation (ECMO). This procedure gives the lungs a chance to recover by taking over the functions of the heart and lungs. It supplies oxygen to the body and removes carbon dioxide. Follow these instructions at home: Lifestyle  If you are sick, stay home except to get medical care. Your health care provider will tell you how long to stay home. Call your health care provider before you go for medical care.  Rest at home as told by your health care provider.  Do not use any products that contain nicotine or tobacco, such as cigarettes,  e-cigarettes, and chewing tobacco. If you need help quitting, ask your health care provider.  Return to your normal activities as told by your health care provider. Ask your health care provider what activities are safe for you. General instructions  Take over-the-counter and prescription medicines only as told by your health care provider.  Drink enough fluid to keep your urine pale yellow.  Keep all follow-up visits as told by your health care provider. This is important. How is this prevented?  There is no vaccine to help prevent COVID-19 infection. However, there are steps you can take to protect yourself and others from this virus. To protect yourself:   Do not travel to areas where COVID-19 is a risk. The areas where COVID-19 is reported change often. To identify high-risk areas and travel restrictions, check the CDC travel website: FatFares.com.br  If you live in, or must travel to, an area where COVID-19 is a risk, take precautions to avoid infection. ? Stay away from people who are sick. ? Wash your hands often with soap and water for 20 seconds. If  soap and water are not available, use an alcohol-based hand sanitizer. ? Avoid touching your mouth, face, eyes, or nose. ? Avoid going out in public, follow guidance from your state and local health authorities. ? If you must go out in public, wear a cloth face covering or face mask. Make sure your mask covers your nose and mouth. ? Avoid crowded indoor spaces. Stay at least 6 feet (2 meters) away from others. ? Disinfect objects and surfaces that are frequently touched every day. This may include:  Counters and tables.  Doorknobs and light switches.  Sinks and faucets.  Electronics, such as phones, remote controls, keyboards, computers, and tablets. To protect others: If you have symptoms of COVID-19, take steps to prevent the virus from spreading to others.  If you think you have a COVID-19 infection, contact  your health care provider right away. Tell your health care team that you think you may have a COVID-19 infection.  Stay home. Leave your house only to seek medical care. Do not use public transport.  Do not travel while you are sick.  Wash your hands often with soap and water for 20 seconds. If soap and water are not available, use alcohol-based hand sanitizer.  Stay away from other members of your household. Let healthy household members care for children and pets, if possible. If you have to care for children or pets, wash your hands often and wear a mask. If possible, stay in your own room, separate from others. Use a different bathroom.  Make sure that all people in your household wash their hands well and often.  Cough or sneeze into a tissue or your sleeve or elbow. Do not cough or sneeze into your hand or into the air.  Wear a cloth face covering or face mask. Make sure your mask covers your nose and mouth. Where to find more information  Centers for Disease Control and Prevention: PurpleGadgets.be  World Health Organization: https://www.castaneda.info/ Contact a health care provider if:  You live in or have traveled to an area where COVID-19 is a risk and you have symptoms of the infection.  You have had contact with someone who has COVID-19 and you have symptoms of the infection. Get help right away if:  You have trouble breathing.  You have pain or pressure in your chest.  You have confusion.  You have bluish lips and fingernails.  You have difficulty waking from sleep.  You have symptoms that get worse. These symptoms may represent a serious problem that is an emergency. Do not wait to see if the symptoms will go away. Get medical help right away. Call your local emergency services (911 in the [place] Do not drive yourself to the hospital. Let the emergency medical personnel know if you think you have  COVID-19. Summary  COVID-19 is a respiratory infection that is caused by a virus. It is also known as coronavirus disease or novel coronavirus. It can cause serious infections, such as pneumonia, acute respiratory distress syndrome, acute respiratory failure, or sepsis.  The virus that causes COVID-19 is contagious. This means that it can spread from person to person through droplets from breathing, speaking, singing, coughing, or sneezing.  You are more likely to develop a serious illness if you are 8 years of age or older, have a weak immune system, live in a nursing home, or have chronic disease.  There is no medicine to treat COVID-19. Your health care provider will talk with you about ways to  treat your symptoms.  Take steps to protect yourself and others from infection. Wash your hands often and disinfect objects and surfaces that are frequently touched every day. Stay away from people who are sick and wear a mask if you are sick. This information is not intended to replace advice given to you by your health care provider. Make sure you discuss any questions you have with your health care provider. Document Revised: 06/17/2019 Document Reviewed: 09/23/2018 Elsevier Patient Education  [place].

## 2019-09-29 NOTE — Telephone Encounter (Signed)
Please set patient up to see pcp in the next 3-4 weeks in person is ok as well  Please advise

## 2019-09-29 NOTE — Progress Notes (Signed)
  Diagnosis: COVID-19  Physician: Reece Levy  Procedure: [facility] Med: remdesivir infusion.  Complications: No immediate complications noted.  Discharge: Discharged home   [place], [place] C 09/29/2019

## 2019-09-30 LAB — CULTURE, BLOOD (ROUTINE X 2)
Culture: NO GROWTH
Culture: NO GROWTH
Special Requests: ADEQUATE

## 2019-10-03 ENCOUNTER — Other Ambulatory Visit: Payer: Self-pay

## 2019-10-03 ENCOUNTER — Ambulatory Visit (INDEPENDENT_AMBULATORY_CARE_PROVIDER_SITE_OTHER): Payer: Self-pay | Admitting: Family

## 2019-10-03 ENCOUNTER — Encounter: Payer: Self-pay | Admitting: Family

## 2019-10-03 DIAGNOSIS — U071 COVID-19: Secondary | ICD-10-CM

## 2019-10-03 MED ORDER — AZITHROMYCIN 250 MG PO TABS: ORAL_TABLET | ORAL | 0 refills | Status: DC

## 2019-10-03 MED ORDER — SERTRALINE HCL 50 MG PO TABS: ORAL_TABLET | ORAL | 0 refills | Status: DC

## 2019-10-03 NOTE — Progress Notes (Signed)
Virtual Visit via Video Note  I connected with Melissa [name] on 10/03/19 at 11:00 AM EST by a video enabled telemedicine application and verified that I am speaking with the correct person using two identifiers.  Location: Patient: home Provider: home   I discussed the limitations of evaluation and management by telemedicine and the availability of in person appointments. The patient expressed understanding and agreed to proceed.  History of Present Illness:  Pt was hospitalized on 1/23 for covid-19.  She had a CTA which was concerning for PNA. She was treated with Rocephin, d'cd on keflex for ? UTI.  She returned to the ED on 1/26 with c/o substernal chest pain. She was noted to have a leukocytosis which was attributed to steroids.  Pt states she lost the keflex rx so she did not take.   She was treated with outpatient remdesivir infusions which she reports she completed.   She reports that she had a Covid exposure on the 15th. Cough and Headache started on the 17th. She had a positive test 1/18 at [facility]. Rapid test that day was negative but send out was positive.   She completed steroids yesterday. Reports no fever since last Wednesday. Denies SOB/wheezing. Very mild cough.  Reports mild right sided chest discomfort with deep inspiration.  X-ray performed in the ED noted:    Patchy atelectasis/consolidation in the right upper lung.  Denies burning/dysuria. Urine culture from the ED noted mixed flora.    Reports increased anxiety since July. Reports that she was the primary caregiver for her mother-in-law at home who died from pneumonia complications.  She has been supporting her husband in his grief.  She herself notes increased anxiety since that period of time.  Reports that she is "always on edge" but tries not to show it.  Notes insomnia because she is nervous and "just thinking."  When asked if she is depressed she states, "yeah, I probably am, but isn't everybody right now." She denies  SI.   Past Medical History:  Diagnosis Date  . Abnormal Pap smear of cervix 07/11/2011  . Acid reflux   . Anxiety   . Asthma    activity induced  . Irregular menses   . Polycystic ovarian syndrome      Social History   Socioeconomic History  . Marital status: Married    Spouse name: Not on file  . Number of children: 0  . Years of education: Not on file  . Highest education level: Not on file  Occupational History  . Occupation: Lexicographer: [employer]   Tobacco Use  . Smoking status: Never Smoker  . Smokeless tobacco: Never Used  Substance and Sexual Activity  . Alcohol use: Yes    Alcohol/week: 4.0 - 7.0 standard drinks    Types: 4 - 7 Cans of beer per week    Comment: 1 beer every other ay  . Drug use: No  . Sexual activity: Yes    Partners: Male    Birth control/protection: None  Other Topics Concern  . Not on file  Social History Narrative   Caffeine use:  1 can soda daily   Regular exercise:  Just started   2 sisters.      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    Social  Determinants of Health   Financial Resource Strain:   . Difficulty of Paying Living Expenses: Not on file  Food Insecurity:   . Worried About Charity fundraiser in the Last Year: Not on file  . Ran Out of Food in the Last Year: Not on file  Transportation Needs:   . Lack of Transportation (Medical): Not on file  . Lack of Transportation (Non-Medical): Not on file  Physical Activity:   . Days of Exercise per Week: Not on file  . Minutes of Exercise per Session: Not on file  Stress:   . Feeling of Stress : Not on file  Social Connections:   . Frequency of Communication with Friends and Family: Not on file  . Frequency of Social Gatherings with Friends and Family: Not on file  . Attends Religious Services: Not on file  .  Active Member of Clubs or Organizations: Not on file  . Attends Archivist Meetings: Not on file  . Marital Status: Not on file  Intimate Partner Violence:   . Fear of Current or Ex-Partner: Not on file  . Emotionally Abused: Not on file  . Physically Abused: Not on file  . Sexually Abused: Not on file    Past Surgical History:  Procedure Laterality Date  . NO PAST SURGERIES      Family History  Problem Relation Age of Onset  . Gallbladder disease Mother   . Colon polyps Mother   . Hypertension Father   . Other Father        Hepatitis A  . Stomach cancer Maternal Grandmother   . Diabetes Maternal Grandmother   . Colon polyps Maternal Grandmother   . Emphysema Maternal Grandfather   . Heart disease Paternal Grandfather   . Breast cancer Other        mat great aunts x 2  . Colon cancer Other        mat great uncles x 2  . Irritable bowel syndrome Sister     Allergies  Allergen Reactions  . Benadryl [Diphenhydramine Hcl] Other (See Comments)    Heart races    Current Outpatient Medications on File Prior to Visit  Medication Sig Dispense Refill  . albuterol (VENTOLIN HFA) 108 (90 Base) MCG/ACT inhaler Inhale 1-2 puffs into the lungs every 6 (six) hours as needed for wheezing or shortness of breath. 8 g 0  . benzonatate (TESSALON) 100 MG capsule Take 1 capsule (100 mg total) by mouth every 8 (eight) hours. 21 capsule 0  . diphenhydramine-acetaminophen (TYLENOL PM) 25-500 MG TABS tablet Take 1 tablet by mouth at bedtime as needed (sleep).    . hydrOXYzine (ATARAX/VISTARIL) 25 MG tablet Take 1 tablet (25 mg total) by mouth every 6 (six) hours. 12 tablet 0  . ibuprofen (ADVIL) 800 MG tablet Take 800 mg by mouth 3 (three) times daily as needed for fever or moderate pain.     . methylPREDNISolone (MEDROL DOSEPAK) 4 MG TBPK tablet follow package directions (Patient taking differently: Take 4-24 mg by mouth as directed. Take 6 tablets by mouth on Day 1, then decrease by 1  tablet daily until gone ([id number].[id number])) 21 tablet 0  . ondansetron (ZOFRAN ODT) 4 MG disintegrating tablet Take 1 tablet (4 mg total) by mouth every 8 (eight) hours as needed for nausea or vomiting. 20 tablet 0  . pantoprazole (PROTONIX) 20 MG tablet Take 1 tablet (20 mg total) by mouth daily. 30 tablet 0  . promethazine (PHENERGAN) 25 MG tablet  Take 1 tablet (25 mg total) by mouth every 8 (eight) hours as needed for nausea or vomiting. 20 tablet 0  . promethazine-dextromethorphan (PROMETHAZINE-DM) 6.25-15 MG/5ML syrup Take 5 mLs by mouth 4 (four) times daily as needed for cough. 118 mL 0  . ranitidine (ZANTAC) 300 MG tablet TAKE 1 TABLET (300 MG TOTAL) BY MOUTH AT BEDTIME. 30 tablet 2   No current facility-administered medications on file prior to visit.    LMP 09/14/2019   Pulse today is 89,  Oxygen is 100% on room air.   Observations/Objective:   Gen: Awake, alert, no acute distress Resp: Breathing is even and non-labored Psych: calm/pleasant demeanor Neuro: Alert and Oriented x 3, + facial symmetry, speech is clear.   Assessment and Plan:  Pneumonia- will plan to give rx for azithromycin for RUL pneumonia. Clinically she is stable however.    Anxiety- Pt given information to call re:  [facility] to schedule an appointment with a counselor.  I instructed pt to start 1/2 tablet once daily for 1 week and then increase to a full tablet once daily on week two as tolerated.  We discussed common side effects such as nausea, drowsiness and weight gain.  Also discussed rare but serious side effect of suicide ideation.  She is instructed to discontinue medication go directly to ED if this occurs.  Pt verbalizes understanding.  Plan follow up in 1 month to evaluate progress.  Will need a follow up CXR and lab work at that time.   30 minutes spent on today's visit.     Follow Up Instructions:    I discussed the assessment and treatment plan with the patient. The  patient was provided an opportunity to ask questions and all were answered. The patient agreed with the plan and demonstrated an understanding of the instructions.   The patient was advised to call back or seek an in-person evaluation if the symptoms worsen or if the condition fails to improve as anticipated.  Nance Pear, NP

## 2019-10-04 ENCOUNTER — Other Ambulatory Visit: Payer: Self-pay

## 2019-10-06 ENCOUNTER — Telehealth: Payer: MEDICAID | Admitting: Physician Assistant

## 2019-10-06 DIAGNOSIS — U071 COVID-19: Secondary | ICD-10-CM

## 2019-10-06 DIAGNOSIS — Z8701 Personal history of pneumonia (recurrent): Secondary | ICD-10-CM

## 2019-10-06 NOTE — Progress Notes (Signed)
Based on what you shared with me, I feel your condition warrants further evaluation and I recommend that you be seen for a face to face office visit.   NOTE: If you entered your credit card information for this eVisit, you will not be charged. You may see a "hold" on your card for the $35 but that hold will drop off and you will not have a charge processed.   If you are having a true medical emergency please call 911.      For an urgent face to face visit, [facility] has five urgent care centers for your convenience:      NEW:  [facility] [place] [facility] [place] Get Driving Directions [id number] [facility] [place], [place] [address] . 10 am - 6pm Monday - Friday    [place] [facility]) Get Driving Directions [id number] [address] [place], [place] [address] . 10 am to 8 pm Monday-Friday . 12 pm to 8 pm [facility] at [facility] Get Driving Directions [id number] [place], [place], [place] [address] . 8 am to 8 pm Monday-Friday . 9 am to 6 pm Saturday . 11 am to 6 pm Sunday     [facility] at [facility] Get Driving Directions  [id number] [address], [place] [address] . 8 am to 8 pm Monday-Friday . 8 am to 4 pm [facility] at [place] Get Driving Directions [id number] [place], [place] [address] . 12 pm to 6 pm Monday-Friday      Your e-visit answers were reviewed by a board certified advanced clinical practitioner to complete your personal care plan.  Thank you for using e-Visits.    Particia Nearing PA-C  Approximately 5 minutes was spent documenting and reviewing patient's chart.

## 2019-10-12 ENCOUNTER — Other Ambulatory Visit: Payer: Self-pay | Admitting: Family Medicine

## 2019-10-12 ENCOUNTER — Encounter: Payer: Self-pay | Admitting: Family

## 2019-10-12 MED ORDER — HYDROXYZINE HCL 25 MG PO TABS: 25.0000 mg | ORAL_TABLET | Freq: Every evening | ORAL | 0 refills | Status: DC | PRN

## 2019-10-17 ENCOUNTER — Other Ambulatory Visit: Payer: Self-pay | Admitting: Family Medicine

## 2019-10-18 ENCOUNTER — Telehealth: Payer: Self-pay | Admitting: Family Medicine

## 2019-10-18 ENCOUNTER — Other Ambulatory Visit: Payer: Self-pay

## 2019-10-18 MED ORDER — HYDROXYZINE HCL 25 MG PO TABS: 25.0000 mg | ORAL_TABLET | Freq: Every evening | ORAL | 0 refills | Status: DC | PRN

## 2019-10-18 NOTE — Telephone Encounter (Signed)
Pt called in about hydrOXYzine (ATARAX/VISTARIL) 25 MG tablet   per Rod Holler she was going to print again and have Dr. B sign it and  Manually fax it back over pham

## 2019-10-20 ENCOUNTER — Ambulatory Visit (INDEPENDENT_AMBULATORY_CARE_PROVIDER_SITE_OTHER): Payer: 59 | Admitting: Family Medicine

## 2019-10-20 ENCOUNTER — Other Ambulatory Visit: Payer: Self-pay

## 2019-10-20 DIAGNOSIS — R1013 Epigastric pain: Secondary | ICD-10-CM

## 2019-10-20 DIAGNOSIS — R41840 Attention and concentration deficit: Secondary | ICD-10-CM

## 2019-10-20 DIAGNOSIS — R110 Nausea: Secondary | ICD-10-CM

## 2019-10-20 DIAGNOSIS — F419 Anxiety disorder, unspecified: Secondary | ICD-10-CM

## 2019-10-20 DIAGNOSIS — F331 Major depressive disorder, recurrent, moderate: Secondary | ICD-10-CM | POA: Diagnosis not present

## 2019-10-20 DIAGNOSIS — R112 Nausea with vomiting, unspecified: Secondary | ICD-10-CM

## 2019-10-20 DIAGNOSIS — U071 COVID-19: Secondary | ICD-10-CM

## 2019-10-20 DIAGNOSIS — K219 Gastro-esophageal reflux disease without esophagitis: Secondary | ICD-10-CM

## 2019-10-20 MED ORDER — HYDROXYZINE HCL 25 MG PO TABS: 25.0000 mg | ORAL_TABLET | Freq: Every evening | ORAL | 1 refills | Status: DC | PRN

## 2019-10-20 MED ORDER — FAMOTIDINE 40 MG PO TABS: 40.0000 mg | ORAL_TABLET | Freq: Every day | ORAL | 3 refills | Status: DC

## 2019-10-20 MED ORDER — PROMETHAZINE HCL 25 MG PO TABS: 25.0000 mg | ORAL_TABLET | Freq: Two times a day (BID) | ORAL | 1 refills | Status: DC | PRN

## 2019-10-20 MED ORDER — BUSPIRONE HCL 5 MG PO TABS: 5.0000 mg | ORAL_TABLET | Freq: Three times a day (TID) | ORAL | 1 refills | Status: DC

## 2019-10-20 MED ORDER — SERTRALINE HCL 50 MG PO TABS: ORAL_TABLET | ORAL | 2 refills | Status: DC

## 2019-10-21 NOTE — Progress Notes (Signed)
Virtual Visit via Video Note  I connected with Melissa [name] on 10/23/19 at 12:20 PM EST by a video enabled telemedicine application and verified that I am speaking with the correct person using two identifiers.  Location: Patient: home Provider: home   I discussed the limitations of evaluation and management by telemedicine and the availability of in person appointments. The patient expressed understanding and agreed to proceed.    Subjective:    Patient ID: Melissa [name], female    DOB: [birth year], 29 y.o.   MRN: [id number]  No chief complaint on file.   HPI Patient is in today for follow up on chronic medical concerns. She was diagnosed with Covid pneumonia in January and is feeling much improved but does still have some congestion and cough as well as nausea. She notes her reflux has gotten much worse and Pantoprazole has not been helpful enough. Promethazine helps the nausea, she is very tearful and overwhelmed by her poor health and being home alone with her 69 and 30 year olds. She notes trouble concentrating and completing tasks. She endorses anhedonia but not suicidal ideation. Denies CP/palp/HA/feversI or GU c/o. Taking meds as prescribed  Past Medical History:  Diagnosis Date  . Abnormal Pap smear of cervix 07/11/2011  . Acid reflux   . Anxiety   . Asthma    activity induced  . Irregular menses   . Polycystic ovarian syndrome     Past Surgical History:  Procedure Laterality Date  . NO PAST SURGERIES      Family History  Problem Relation Age of Onset  . Gallbladder disease Mother   . Colon polyps Mother   . Hypertension Father   . Other Father        Hepatitis A  . Stomach cancer Maternal Grandmother   . Diabetes Maternal Grandmother   . Colon polyps Maternal Grandmother   . Emphysema Maternal Grandfather   . Heart disease Paternal Grandfather   . Breast cancer Other        mat great aunts x 2  . Colon cancer Other        mat great uncles x 2  . Irritable  bowel syndrome Sister     Social History   Socioeconomic History  . Marital status: Married    Spouse name: Not on file  . Number of children: 0  . Years of education: Not on file  . Highest education level: Not on file  Occupational History  . Occupation: Lexicographer: [employer]   Tobacco Use  . Smoking status: Never Smoker  . Smokeless tobacco: Never Used  Substance and Sexual Activity  . Alcohol use: Yes    Alcohol/week: 4.0 - 7.0 standard drinks    Types: 4 - 7 Cans of beer per week    Comment: 1 beer every other ay  . Drug use: No  . Sexual activity: Yes    Partners: Male    Birth control/protection: None  Other Topics Concern  . Not on file  Social History Narrative   Caffeine use:  1 can soda daily   Regular exercise:  Just started   2 sisters.      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    Social Determinants of Health   Financial Resource Strain:   .  Difficulty of Paying Living Expenses: Not on file  Food Insecurity:   . Worried About Charity fundraiser in the Last Year: Not on file  . Ran Out of Food in the Last Year: Not on file  Transportation Needs:   . Lack of Transportation (Medical): Not on file  . Lack of Transportation (Non-Medical): Not on file  Physical Activity:   . Days of Exercise per Week: Not on file  . Minutes of Exercise per Session: Not on file  Stress:   . Feeling of Stress : Not on file  Social Connections:   . Frequency of Communication with Friends and Family: Not on file  . Frequency of Social Gatherings with Friends and Family: Not on file  . Attends Religious Services: Not on file  . Active Member of Clubs or Organizations: Not on file  . Attends Archivist Meetings: Not on file  . Marital Status: Not on file  Intimate Partner Violence:   . Fear of  Current or Ex-Partner: Not on file  . Emotionally Abused: Not on file  . Physically Abused: Not on file  . Sexually Abused: Not on file    Outpatient Medications Prior to Visit  Medication Sig Dispense Refill  . albuterol (VENTOLIN HFA) 108 (90 Base) MCG/ACT inhaler Inhale 1-2 puffs into the lungs every 6 (six) hours as needed for wheezing or shortness of breath. 8 g 0  . benzonatate (TESSALON) 100 MG capsule Take 1 capsule (100 mg total) by mouth every 8 (eight) hours. 21 capsule 0  . diphenhydramine-acetaminophen (TYLENOL PM) 25-500 MG TABS tablet Take 1 tablet by mouth at bedtime as needed (sleep).    . ondansetron (ZOFRAN ODT) 4 MG disintegrating tablet Take 1 tablet (4 mg total) by mouth every 8 (eight) hours as needed for nausea or vomiting. 20 tablet 0  . pantoprazole (PROTONIX) 20 MG tablet Take 1 tablet (20 mg total) by mouth daily. 30 tablet 0  . azithromycin (ZITHROMAX) 250 MG tablet Take 2 tabs by mouth today, then one tab once daily for 4 more day. 6 tablet 0  . hydrOXYzine (ATARAX/VISTARIL) 25 MG tablet Take 1 tablet (25 mg total) by mouth at bedtime as needed for anxiety. 20 tablet 0  . promethazine (PHENERGAN) 25 MG tablet Take 1 tablet (25 mg total) by mouth every 8 (eight) hours as needed for nausea or vomiting. 20 tablet 0  . promethazine-dextromethorphan (PROMETHAZINE-DM) 6.25-15 MG/5ML syrup Take 5 mLs by mouth 4 (four) times daily as needed for cough. 118 mL 0  . sertraline (ZOLOFT) 50 MG tablet 1/2 tablet by mouth once daily for 1 week, then increase to a full tab once daily on week two. 30 tablet 0   No facility-administered medications prior to visit.    Allergies  Allergen Reactions  . Benadryl [Diphenhydramine Hcl] Other (See Comments)    Heart races    Review of Systems  Constitutional: Positive for malaise/fatigue. Negative for fever.  HENT: Positive for congestion.   Eyes: Negative for blurred vision.  Respiratory: Positive for shortness of breath.     Cardiovascular: Negative for chest pain, palpitations and leg swelling.  Gastrointestinal: Positive for nausea and vomiting. Negative for abdominal pain and blood in stool.  Genitourinary: Negative for dysuria and frequency.  Musculoskeletal: Negative for falls.  Skin: Negative for rash.  Neurological: Negative for dizziness, loss of consciousness and headaches.  Endo/Heme/Allergies: Negative for environmental allergies.  Psychiatric/Behavioral: Positive for depression. The patient is nervous/anxious.  Objective:    Physical Exam Constitutional:      Appearance: Normal appearance. She is not ill-appearing.  HENT:     Head: Normocephalic and atraumatic.     Right Ear: External ear normal.     Left Ear: External ear normal.  Neurological:     Mental Status: She is alert.     There were no vitals taken for this visit. Wt Readings from Last 3 Encounters:  09/27/19 220 lb (99.8 kg)  09/25/19 197 lb 8.5 oz (89.6 kg)  02/09/17 197 lb 9.6 oz (89.6 kg)    Diabetic Foot Exam - Simple   No data filed     Lab Results  Component Value Date   WBC 14.8 (H) 09/27/2019   HGB 13.1 09/27/2019   HCT 41.4 09/27/2019   PLT 412 (H) 09/27/2019   GLUCOSE 95 09/27/2019   CHOL 177 02/09/2017   TRIG 83 09/24/2019   HDL 46.00 02/09/2017   LDLCALC 116 (H) 02/09/2017   ALT 20 09/27/2019   AST 17 09/27/2019   NA 137 09/27/2019   K 3.6 09/27/2019   CL 104 09/27/2019   CREATININE 0.90 09/27/2019   BUN 13 09/27/2019   CO2 22 09/27/2019   TSH 1.78 02/09/2017   HGBA1C 6.0 09/12/2014    Lab Results  Component Value Date   TSH 1.78 02/09/2017   Lab Results  Component Value Date   WBC 14.8 (H) 09/27/2019   HGB 13.1 09/27/2019   HCT 41.4 09/27/2019   MCV 78.3 (L) 09/27/2019   PLT 412 (H) 09/27/2019   Lab Results  Component Value Date   NA 137 09/27/2019   K 3.6 09/27/2019   CO2 22 09/27/2019   GLUCOSE 95 09/27/2019   BUN 13 09/27/2019   CREATININE 0.90 09/27/2019    BILITOT 0.7 09/27/2019   ALKPHOS 78 09/27/2019   AST 17 09/27/2019   ALT 20 09/27/2019   PROT 7.8 09/27/2019   ALBUMIN 3.4 (L) 09/27/2019   CALCIUM 8.3 (L) 09/27/2019   ANIONGAP 11 09/27/2019   GFR 168.85 02/09/2017   Lab Results  Component Value Date   CHOL 177 02/09/2017   Lab Results  Component Value Date   HDL 46.00 02/09/2017   Lab Results  Component Value Date   LDLCALC 116 (H) 02/09/2017   Lab Results  Component Value Date   TRIG 83 09/24/2019   Lab Results  Component Value Date   CHOLHDL 4 02/09/2017   Lab Results  Component Value Date   HGBA1C 6.0 09/12/2014       Assessment & Plan:   Problem List Items Addressed This Visit    Epigastric pain   Relevant Orders   Ambulatory referral to Gastroenterology   Depression    Is really struggling with the stress of being home alone with her 30 and 31 year old. Will use Sertraline and referred to behavioral health for further consideration      Relevant Medications   hydrOXYzine (ATARAX/VISTARIL) 25 MG tablet   sertraline (ZOLOFT) 50 MG tablet   busPIRone (BUSPAR) 5 MG tablet   Other Relevant Orders   Ambulatory referral to [facility]   GERD (gastroesophageal reflux disease)    Avoid offending foods, start probiotics. Do not eat large meals in late evening and consider raising head of bed. Has been flared recently and Pantoprazole has not been able to control it. Will add Famotidine and refer to gastroenterology for further consideration. Will check labs including H Pylori. May use Promethazine  for nausea and vomiting.       Relevant Medications   famotidine (PEPCID) 40 MG tablet   Other Relevant Orders   Ambulatory referral to Gastroenterology   Nausea with vomiting    Maintain low fat low spice diet and refill given on Promethazine      Poor concentration - Primary    Having trouble staying focused and completing tasks. Referred to behavioral health for an evaluationof adult ADD      Relevant  Orders   Ambulatory referral to [place]    Likely multifactorial causes Buspar is added tid and will f/u in 4-8 weeks or as needed. Referred to behavioral health for counseling and evaluation      Relevant Medications   hydrOXYzine (ATARAX/VISTARIL) 25 MG tablet   sertraline (ZOLOFT) 50 MG tablet   busPIRone (BUSPAR) 5 MG tablet   Other Relevant Orders   Ambulatory referral to [facility]    Other Visit Diagnoses    Nausea       Relevant Medications   promethazine (PHENERGAN) 25 MG tablet   Other Relevant Orders   Ambulatory referral to Gastroenterology      I have discontinued Sheryll Durall's promethazine-dextromethorphan and azithromycin. I have also changed her promethazine. Additionally, I am having her start on busPIRone and famotidine. Lastly, I am having her maintain her albuterol, benzonatate, pantoprazole, diphenhydramine-acetaminophen, ondansetron, hydrOXYzine, and sertraline.  Meds ordered this encounter  Medications  . promethazine (PHENERGAN) 25 MG tablet    Sig: Take 1 tablet (25 mg total) by mouth 2 (two) times daily as needed for nausea or vomiting (during the day).    Dispense:  45 tablet    Refill:  1  . hydrOXYzine (ATARAX/VISTARIL) 25 MG tablet    Sig: Take 1 tablet (25 mg total) by mouth at bedtime as needed for anxiety.    Dispense:  30 tablet    Refill:  1  . sertraline (ZOLOFT) 50 MG tablet    Sig: 1/2 tablet by mouth once daily for 1 week, then increase to a full tab once daily on week two.    Dispense:  30 tablet    Refill:  2  . busPIRone (BUSPAR) 5 MG tablet    Sig: Take 1 tablet (5 mg total) by mouth 3 (three) times daily.    Dispense:  90 tablet    Refill:  1  . famotidine (PEPCID) 40 MG tablet    Sig: Take 1 tablet (40 mg total) by mouth at bedtime.    Dispense:  30 tablet    Refill:  3     Penni Homans, MD    I discussed the assessment and treatment plan with the patient. The patient was provided an  opportunity to ask questions and all were answered. The patient agreed with the plan and demonstrated an understanding of the instructions.   The patient was advised to call back or seek an in-person evaluation if the symptoms worsen or if the condition fails to improve as anticipated.  I provided 25 minutes of non-face-to-face time during this encounter.   Penni Homans, MD

## 2019-10-23 DIAGNOSIS — F32A Depression, unspecified: Secondary | ICD-10-CM | POA: Insufficient documentation

## 2019-10-23 DIAGNOSIS — F419 Anxiety disorder, unspecified: Secondary | ICD-10-CM | POA: Insufficient documentation

## 2019-10-23 DIAGNOSIS — R41840 Attention and concentration deficit: Secondary | ICD-10-CM | POA: Insufficient documentation

## 2019-10-23 NOTE — Assessment & Plan Note (Signed)
Maintain low fat low spice diet and refill given on Promethazine

## 2019-10-23 NOTE — Assessment & Plan Note (Signed)
Having trouble staying focused and completing tasks. Referred to behavioral health for an evaluationof adult ADD

## 2019-10-23 NOTE — Assessment & Plan Note (Signed)
Likely multifactorial causes Buspar is added tid and will f/u in 4-8 weeks or as needed. Referred to behavioral health for counseling and evaluation

## 2019-10-23 NOTE — Assessment & Plan Note (Addendum)
She is feeling much better but not fully recovered. She has lost 25 pounds. Her cough and sob are improving. She willreport any concerns.

## 2019-10-23 NOTE — Assessment & Plan Note (Signed)
Avoid offending foods, start probiotics. Do not eat large meals in late evening and consider raising head of bed. Has been flared recently and Pantoprazole has not been able to control it. Will add Famotidine and refer to gastroenterology for further consideration. Will check labs including H Pylori. May use Promethazine for nausea and vomiting.

## 2019-10-23 NOTE — Assessment & Plan Note (Signed)
Is really struggling with the stress of being home alone with her 42 and 31 year old. Will use Sertraline and referred to behavioral health for further consideration

## 2019-10-25 ENCOUNTER — Encounter: Payer: Self-pay | Admitting: Gastroenterology

## 2019-10-25 ENCOUNTER — Other Ambulatory Visit: Payer: Self-pay

## 2019-10-25 ENCOUNTER — Ambulatory Visit (INDEPENDENT_AMBULATORY_CARE_PROVIDER_SITE_OTHER): Payer: 59 | Admitting: Gastroenterology

## 2019-10-25 VITALS — BP 102/78 | HR 86 | Temp 97.0°F | Ht 62.0 in | Wt 238.0 lb

## 2019-10-25 DIAGNOSIS — R110 Nausea: Secondary | ICD-10-CM

## 2019-10-25 DIAGNOSIS — R112 Nausea with vomiting, unspecified: Secondary | ICD-10-CM | POA: Diagnosis not present

## 2019-10-25 MED ORDER — ONDANSETRON HCL 4 MG PO TABS: ORAL_TABLET | ORAL | 3 refills | Status: DC

## 2019-10-25 MED ORDER — PANTOPRAZOLE SODIUM 40 MG PO TBEC: 40.0000 mg | DELAYED_RELEASE_TABLET | Freq: Every day | ORAL | 11 refills | Status: DC

## 2019-10-25 MED ORDER — PROMETHAZINE HCL 25 MG PO TABS: 25.0000 mg | ORAL_TABLET | ORAL | 1 refills | Status: DC | PRN

## 2019-10-25 NOTE — Patient Instructions (Signed)
If you are age 31 or older, your body mass index should be between 23-30. Your Body mass index is 43.53 kg/m. If this is out of the aforementioned range listed, please consider follow up with your Primary Care Provider.  If you are age 31 or younger, your body mass index should be between 19-25. Your Body mass index is 43.53 kg/m. If this is out of the aformentioned range listed, please consider follow up with your Primary Care Provider.   We have sent the following medications to your pharmacy for you to pick up at your convenience:  Increase: Protonix to 40mg  one tablet everyday 30-60 minutes before breakfast meal. Continue: Pepcid 20mg  at bedtime  You should only use phenegran as needed.  START: Zofran 4 mg one tablet by mouth twice daily for 2 weeks, then only use as needed for nausea.  You should cut back on the use of NSAIDS  You are scheduled for follow up on 11-22-19 @ 10:10am  Thank you, Dr Ardis Hughs

## 2019-10-25 NOTE — Progress Notes (Signed)
HPI: This is a very pleasant 29 year old woman who was referred to me by [name], MD  to evaluate vomiting, GERD, abdominal pain.    She has mild chronic GERD symptoms for years.  She has been on proton pump inhibitor over-the-counter strength daily for many years.  Last month she contracted Covid.  She was hospitalized for 24 hours.  She was put on steroids, remdesivir.  She vomited about every day for 2 weeks.  She caught this from one of her friends.  She did have diarrhea for about 2 days.  The vomiting stopped about 3 weeks ago.  She was put on Phenergan 25 mg which she takes once or twice a day.  Does seem to help.  She continues on her proton pump inhibitor and also takes Tums periodically.  She does not have any dysphagia.  She is not taking her Zofran orally disintegrating tablets.  She lost about 20 pounds with all of this.  She takes Excedrin about 2 pills daily for headaches  She had Covid associated pneumonia January 2021.   Old Data Reviewed: I did an upper endoscopy for her April 2015 for epigastric pain, nausea, vomiting.  At that time she had had normal CBC, normal LFTs, normal ultrasound.  The upper endoscopy was completely normal.  Follow-up HIDA scan to estimate her gallbladder ejection fraction was normal as well.   Review of systems: Pertinent positive and negative review of systems were noted in the above HPI section. All other review negative.   Past Medical History:  Diagnosis Date  . Abnormal Pap smear of cervix 07/11/2011  . Acid reflux   . Anxiety   . Asthma    activity induced  . Irregular menses   . Polycystic ovarian syndrome     Past Surgical History:  Procedure Laterality Date  . NO PAST SURGERIES      Current Outpatient Medications  Medication Sig Dispense Refill  . albuterol (VENTOLIN HFA) 108 (90 Base) MCG/ACT inhaler Inhale 1-2 puffs into the lungs every 6 (six) hours as needed for wheezing or shortness of breath. 8 g 0  .  benzonatate (TESSALON) 100 MG capsule Take 1 capsule (100 mg total) by mouth every 8 (eight) hours. 21 capsule 0  . busPIRone (BUSPAR) 5 MG tablet Take 1 tablet (5 mg total) by mouth 3 (three) times daily. 90 tablet 1  . diphenhydramine-acetaminophen (TYLENOL PM) 25-500 MG TABS tablet Take 1 tablet by mouth at bedtime as needed (sleep).    . famotidine (PEPCID) 40 MG tablet Take 1 tablet (40 mg total) by mouth at bedtime. 30 tablet 3  . hydrOXYzine (ATARAX/VISTARIL) 25 MG tablet Take 1 tablet (25 mg total) by mouth at bedtime as needed for anxiety. 30 tablet 1  . ondansetron (ZOFRAN ODT) 4 MG disintegrating tablet Take 1 tablet (4 mg total) by mouth every 8 (eight) hours as needed for nausea or vomiting. 20 tablet 0  . pantoprazole (PROTONIX) 20 MG tablet Take 1 tablet (20 mg total) by mouth daily. 30 tablet 0  . promethazine (PHENERGAN) 25 MG tablet Take 1 tablet (25 mg total) by mouth 2 (two) times daily as needed for nausea or vomiting (during the day). 45 tablet 1  . sertraline (ZOLOFT) 50 MG tablet 1/2 tablet by mouth once daily for 1 week, then increase to a full tab once daily on week two. 30 tablet 2   No current facility-administered medications for this visit.    Allergies as of 10/25/2019 -  Review Complete 10/25/2019  Allergen Reaction Noted  . Benadryl [diphenhydramine hcl] Other (See Comments) 01/27/2012    Family History  Problem Relation Age of Onset  . Gallbladder disease Mother   . Colon polyps Mother   . Hypertension Father   . Other Father        Hepatitis A  . Stomach cancer Maternal Grandmother   . Diabetes Maternal Grandmother   . Colon polyps Maternal Grandmother   . Colon cancer Maternal Grandmother   . Emphysema Maternal Grandfather   . Heart disease Paternal Grandfather   . Breast cancer Other        mat great aunts x 2  . Colon cancer Other        mat great uncles x 2  . Irritable bowel syndrome Sister   . Pancreatic cancer Neg Hx   . Esophageal cancer  Neg Hx     Social History   Socioeconomic History  . Marital status: Married    Spouse name: Not on file  . Number of children: 0  . Years of education: Not on file  . Highest education level: Not on file  Occupational History  . Occupation: Lexicographer: [employer]   Tobacco Use  . Smoking status: Never Smoker  . Smokeless tobacco: Never Used  Substance and Sexual Activity  . Alcohol use: Not Currently    Alcohol/week: 4.0 - 7.0 standard drinks    Types: 4 - 7 Cans of beer per week    Comment: 1 beer every other ay  . Drug use: Yes    Types: Marijuana  . Sexual activity: Yes    Partners: Male    Birth control/protection: None  Other Topics Concern  . Not on file  Social History Narrative   Caffeine use:  1 can soda daily   Regular exercise:  Just started   2 sisters.      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    Social Determinants of Health   Financial Resource Strain:   . Difficulty of Paying Living Expenses: Not on file  Food Insecurity:   . Worried About Charity fundraiser in the Last Year: Not on file  . Ran Out of Food in the Last Year: Not on file  Transportation Needs:   . Lack of Transportation (Medical): Not on file  . Lack of Transportation (Non-Medical): Not on file  Physical Activity:   . Days of Exercise per Week: Not on file  . Minutes of Exercise per Session: Not on file  Stress:   . Feeling of Stress : Not on file  Social Connections:   . Frequency of Communication with Friends and Family: Not on file  . Frequency of Social Gatherings with Friends and Family: Not on file  . Attends Religious Services: Not on file  . Active Member of Clubs or Organizations: Not on file  . Attends Archivist Meetings: Not on file  . Marital Status: Not on file  Intimate Partner Violence:    . Fear of Current or Ex-Partner: Not on file  . Emotionally Abused: Not on file  . Physically Abused: Not on file  . Sexually Abused: Not on file     Physical Exam: BP 102/78 (BP Location: Left Arm, Patient Position: Sitting, Cuff  Size: Large)   Pulse 86   Temp (!) 97 F (36.1 C)   Ht 5\' 2"  (1.575 m)   Wt 238 lb (108 kg)   SpO2 98%   BMI 43.53 kg/m  Constitutional: generally well-appearing except for obesity Psychiatric: alert and oriented x3 Eyes: extraocular movements intact Mouth: oral pharynx moist, no lesions Neck: supple no lymphadenopathy Cardiovascular: heart regular rate and rhythm Lungs: clear to auscultation bilaterally Abdomen: soft, nontender, nondistended, no obvious ascites, no peritoneal signs, normal bowel sounds Extremities: no lower extremity edema bilaterally Skin: no lesions on visible extremities   Assessment and plan: 29 y.o. female with daily vomiting for about 2 weeks with Covid, chronic GERD, obesity  Vomiting, diarrheal symptoms with Covid are not uncommon.  She vomited every day at least once or twice a day for about 2 weeks.  I suspect she has some significant damage to her esophagus with all of this.  She still has some abdominal pains and GERD symptoms.  She is only on over-the-counter strength proton pump inhibitor.  She takes NSAIDs daily.  I am going to call her in a prescription strength proton pump inhibitor, she will take once daily shortly before either her breakfast or lunch meal.  She will continue taking Pepcid at bedtime every night.  She is going to try to limit her NSAID use.  I recommended that she use Phenergan on an as-needed basis only and instead take Zofran twice daily for the next 2 weeks as things settle down in her esophagus, stomach.  After that I hope she will only need Zofran on a as needed basis.  She will return to see me in 2 to 3 months and sooner if needed.    Please see the "Patient Instructions" section for  addition details about the plan.   Owens Loffler, MD [facility] 10/25/2019, 8:52 AM  Cc: [name], MD  Total time on date of encounter was45 minutes (this included time spent preparing to see the patient reviewing records; obtaining and/or reviewing separately obtained history; performing a medically appropriate exam and/or evaluation; counseling and educating the patient and family if present; ordering medications, tests or procedures if applicable; and documenting clinical information in the health record).

## 2019-10-28 ENCOUNTER — Encounter: Payer: Self-pay | Admitting: Family Medicine

## 2019-10-28 ENCOUNTER — Other Ambulatory Visit: Payer: Self-pay | Admitting: Family Medicine

## 2019-10-28 MED ORDER — BUSPIRONE HCL 10 MG PO TABS: 10.0000 mg | ORAL_TABLET | Freq: Three times a day (TID) | ORAL | 1 refills | Status: DC

## 2019-11-04 ENCOUNTER — Telehealth: Payer: Self-pay | Admitting: Family Medicine

## 2019-11-04 NOTE — Telephone Encounter (Signed)
Pt is requesting to her have  CPE & PAP Done by Dr. Lorelei Pont. Due to [facility] CPE  Availability. Please Advise. Thanks

## 2019-11-07 ENCOUNTER — Other Ambulatory Visit: Payer: Self-pay

## 2019-11-07 NOTE — Telephone Encounter (Signed)
Done

## 2019-11-07 NOTE — Telephone Encounter (Signed)
Ok to schedule with [place]

## 2019-11-09 ENCOUNTER — Encounter: Payer: 59 | Admitting: Family Medicine

## 2019-11-11 ENCOUNTER — Other Ambulatory Visit: Payer: Self-pay

## 2019-11-14 ENCOUNTER — Encounter: Payer: Self-pay | Admitting: Family Medicine

## 2019-11-14 ENCOUNTER — Telehealth: Payer: Self-pay

## 2019-11-14 ENCOUNTER — Other Ambulatory Visit (HOSPITAL_COMMUNITY)
Admission: RE | Admit: 2019-11-14 | Discharge: 2019-11-14 | Disposition: A | Discharge status: Home / Self Care | Payer: 59 | Source: Ambulatory Visit | Attending: Family Medicine | Admitting: Family Medicine

## 2019-11-14 ENCOUNTER — Other Ambulatory Visit: Payer: Self-pay

## 2019-11-14 ENCOUNTER — Ambulatory Visit (INDEPENDENT_AMBULATORY_CARE_PROVIDER_SITE_OTHER): Payer: 59 | Admitting: Family Medicine

## 2019-11-14 VITALS — BP 105/69 | HR 81 | Temp 96.9°F | Resp 12 | Ht 62.0 in | Wt 241.8 lb

## 2019-11-14 DIAGNOSIS — U071 COVID-19: Secondary | ICD-10-CM

## 2019-11-14 DIAGNOSIS — N898 Other specified noninflammatory disorders of vagina: Secondary | ICD-10-CM | POA: Diagnosis present

## 2019-11-14 DIAGNOSIS — Z0000 Encounter for general adult medical examination without abnormal findings: Secondary | ICD-10-CM

## 2019-11-14 DIAGNOSIS — K219 Gastro-esophageal reflux disease without esophagitis: Secondary | ICD-10-CM

## 2019-11-14 DIAGNOSIS — F329 Major depressive disorder, single episode, unspecified: Secondary | ICD-10-CM

## 2019-11-14 DIAGNOSIS — J4520 Mild intermittent asthma, uncomplicated: Secondary | ICD-10-CM | POA: Diagnosis not present

## 2019-11-14 DIAGNOSIS — Z202 Contact with and (suspected) exposure to infections with a predominantly sexual mode of transmission: Secondary | ICD-10-CM

## 2019-11-14 DIAGNOSIS — R41840 Attention and concentration deficit: Secondary | ICD-10-CM

## 2019-11-14 DIAGNOSIS — Z124 Encounter for screening for malignant neoplasm of cervix: Secondary | ICD-10-CM | POA: Insufficient documentation

## 2019-11-14 DIAGNOSIS — R1013 Epigastric pain: Secondary | ICD-10-CM | POA: Diagnosis not present

## 2019-11-14 DIAGNOSIS — F32A Depression, unspecified: Secondary | ICD-10-CM

## 2019-11-14 DIAGNOSIS — R112 Nausea with vomiting, unspecified: Secondary | ICD-10-CM | POA: Diagnosis not present

## 2019-11-14 LAB — CBC
HCT: 37.6 % (ref 36.0–46.0)
Hemoglobin: 12.3 g/dL (ref 12.0–15.0)
MCHC: 32.9 g/dL (ref 30.0–36.0)
MCV: 78.7 fl (ref 78.0–100.0)
Platelets: 361 10*3/uL (ref 150.0–400.0)
RBC: 4.78 Mil/uL (ref 3.87–5.11)
RDW: 18.1 % — ABNORMAL HIGH (ref 11.5–15.5)
WBC: 5.5 10*3/uL (ref 4.0–10.5)

## 2019-11-14 LAB — LIPID PANEL
Cholesterol: 208 mg/dL — ABNORMAL HIGH (ref 0–200)
HDL: 48.8 mg/dL (ref >39.00)
LDL Cholesterol: 139 mg/dL — ABNORMAL HIGH (ref 0–99)
NonHDL: 159.11
Total CHOL/HDL Ratio: 4
Triglycerides: 100 mg/dL (ref 0.0–149.0)
VLDL: 20 mg/dL (ref 0.0–40.0)

## 2019-11-14 LAB — COMPREHENSIVE METABOLIC PANEL
ALT: 19 U/L (ref 0–35)
AST: 12 U/L (ref 0–37)
Albumin: 4 g/dL (ref 3.5–5.2)
Alkaline Phosphatase: 69 U/L (ref 39–117)
BUN: 12 mg/dL (ref 6–23)
CO2: 27 mEq/L (ref 19–32)
Calcium: 8.8 mg/dL (ref 8.4–10.5)
Chloride: 103 mEq/L (ref 96–112)
Creatinine, Ser: 0.54 mg/dL (ref 0.40–1.20)
GFR: 159.23 mL/min (ref >60.00)
Glucose, Bld: 89 mg/dL (ref 70–99)
Potassium: 4.1 mEq/L (ref 3.5–5.1)
Sodium: 139 mEq/L (ref 135–145)
Total Bilirubin: 0.3 mg/dL (ref 0.2–1.2)
Total Protein: 7.1 g/dL (ref 6.0–8.3)

## 2019-11-14 LAB — VITAMIN D 25 HYDROXY (VIT D DEFICIENCY, FRACTURES): VITD: 13.45 ng/mL — ABNORMAL LOW (ref 30.00–100.00)

## 2019-11-14 LAB — TSH: TSH: 1.39 u[IU]/mL (ref 0.35–4.50)

## 2019-11-14 MED ORDER — ALBUTEROL SULFATE HFA 108 (90 BASE) MCG/ACT IN AERS: 1.0000 | INHALATION_SPRAY | Freq: Four times a day (QID) | RESPIRATORY_TRACT | 2 refills | Status: DC | PRN

## 2019-11-14 MED ORDER — SERTRALINE HCL 100 MG PO TABS: 100.0000 mg | ORAL_TABLET | Freq: Every day | ORAL | 3 refills | Status: DC

## 2019-11-14 MED ORDER — METHYLPHENIDATE HCL 10 MG PO TABS: 10.0000 mg | ORAL_TABLET | Freq: Two times a day (BID) | ORAL | 0 refills | Status: DC

## 2019-11-14 MED ORDER — SUCRALFATE 1 GM/10ML PO SUSP: 1.0000 g | Freq: Three times a day (TID) | ORAL | 0 refills | Status: DC

## 2019-11-14 MED ORDER — ONDANSETRON HCL 4 MG PO TABS: 4.0000 mg | ORAL_TABLET | Freq: Three times a day (TID) | ORAL | 0 refills | Status: DC | PRN

## 2019-11-14 NOTE — Assessment & Plan Note (Signed)
Patient encouraged to maintain heart healthy diet, regular exercise, adequate sleep. Consider daily probiotics. Take medications as prescribed. Labs ordered and reviewed pap completed today.

## 2019-11-14 NOTE — Telephone Encounter (Signed)
Carafate suspension not covered by [employer] insurance- requesting to change to tablets?

## 2019-11-14 NOTE — Patient Instructions (Addendum)
Omron Blood Pressure cuff, upper arm, want BP 100-140/60-90 Pulse oximeter, want oxygen in 90s  Weekly vitals  Take Multivitamin with minerals, selenium Vitamin D 1000-2000 IU daily Probiotic with lactobacillus and bifidophilus Asprin EC 81 mg daily  Melatonin 2-5 mg at bedtime  [email or web address]  The mRNA technology has been in development for 20 years and we already had the Coronavirus family of viruses (which usually just cause the common cold) genetically mapped already which is why we were able to come up with viable vaccine candidates so quickly in stage 1, then stage 2 scientifically took the correct amount of time what we did to speed it up was just build the manufacturing platform at the same time we were running the experiments so if it worked we could produce faster. And stage 3 has now had many months and millions of people immunized and we are seeing the immunity hold for over 9 months now with sign of it dissipating and no significant numbers of adverse reactions.  During every flu season we see 2 anaphylactic reactions for every million shots given and we initially thought we would see 11 per million with the COVID vaccine but now we see only 2-3 with Moderna and 5 or so with [place] so compared to someone is dying every 20 minutes from [place] and more deadly and infectious strains are coming it is definitely best when weighing the risks and benefits to take the shots.  Another pooled analysis of the 5 most utilized vaccines in the world shows that after full immunization so far no one has died from [place].     Preventive Care 67-12 Years Old, Female Preventive care refers to visits with your health care provider and lifestyle choices that can promote health and wellness. This includes:  A yearly physical exam. This may also be called an annual well check.  Regular dental visits and eye exams.  Immunizations.  Screening for certain  conditions.  Healthy lifestyle choices, such as eating a healthy diet, getting regular exercise, not using drugs or products that contain nicotine and tobacco, and limiting alcohol use. What can I expect for my preventive care visit? Physical exam Your health care provider will check your:  Height and weight. This may be used to calculate body mass index (BMI), which tells if you are at a healthy weight.  Heart rate and blood pressure.  Skin for abnormal spots. Counseling Your health care provider may ask you questions about your:  Alcohol, tobacco, and drug use.  Emotional well-being.  Home and relationship well-being.  Sexual activity.  Eating habits.  Work and work Statistician.  Method of birth control.  Menstrual cycle.  Pregnancy history. What immunizations do I need?  Influenza (flu) vaccine  This is recommended every year. Tetanus, diphtheria, and pertussis (Tdap) vaccine  You may need a Td booster every 10 years. Varicella (chickenpox) vaccine  You may need this if you have not been vaccinated. Human papillomavirus (HPV) vaccine  If recommended by your health care provider, you may need three doses over 6 months. Measles, mumps, and rubella (MMR) vaccine  You may need at least one dose of MMR. You may also need a second dose. Meningococcal conjugate (MenACWY) vaccine  One dose is recommended if you are age 24-21 years and a first-year college student living in a residence hall, or if you have one of several medical conditions. You may also need additional booster doses. Pneumococcal conjugate (PCV13) vaccine  You may need this if  you have certain conditions and were not previously vaccinated. Pneumococcal polysaccharide (PPSV23) vaccine  You may need one or two doses if you smoke cigarettes or if you have certain conditions. Hepatitis A vaccine  You may need this if you have certain conditions or if you travel or work in places where you may be  exposed to hepatitis A. Hepatitis B vaccine  You may need this if you have certain conditions or if you travel or work in places where you may be exposed to hepatitis B. Haemophilus influenzae type b (Hib) vaccine  You may need this if you have certain conditions. You may receive vaccines as individual doses or as more than one vaccine together in one shot (combination vaccines). Talk with your health care provider about the risks and benefits of combination vaccines. What tests do I need?  Blood tests  Lipid and cholesterol levels. These may be checked every 5 years starting at age 74.  Hepatitis C test.  Hepatitis B test. Screening  Diabetes screening. This is done by checking your blood sugar (glucose) after you have not eaten for a while (fasting).  Sexually transmitted disease (STD) testing.  BRCA-related cancer screening. This may be done if you have a family history of breast, ovarian, tubal, or peritoneal cancers.  Pelvic exam and Pap test. This may be done every 3 years starting at age 42. Starting at age 61, this may be done every 5 years if you have a Pap test in combination with an HPV test. Talk with your health care provider about your test results, treatment options, and if necessary, the need for more tests. Follow these instructions at home: Eating and drinking   Eat a diet that includes fresh fruits and vegetables, whole grains, lean protein, and low-fat dairy.  Take vitamin and mineral supplements as recommended by your health care provider.  Do not drink alcohol if: ? Your health care provider tells you not to drink. ? You are pregnant, may be pregnant, or are planning to become pregnant.  If you drink alcohol: ? Limit how much you have to 0-1 drink a day. ? Be aware of how much alcohol is in your drink. In the [place] one drink equals one 12 oz bottle of beer (355 mL), one 5 oz glass of wine (148 mL), or one 1 oz glass of hard liquor (44  mL). Lifestyle  Take daily care of your teeth and gums.  Stay active. Exercise for at least 30 minutes on 5 or more days each week.  Do not use any products that contain nicotine or tobacco, such as cigarettes, e-cigarettes, and chewing tobacco. If you need help quitting, ask your health care provider.  If you are sexually active, practice safe sex. Use a condom or other form of birth control (contraception) in order to prevent pregnancy and STIs (sexually transmitted infections). If you plan to become pregnant, see your health care provider for a preconception visit. What's next?  Visit your health care provider once a year for a well check visit.  Ask your health care provider how often you should have your eyes and teeth checked.  Stay up to date on all vaccines. This information is not intended to replace advice given to you by your health care provider. Make sure you discuss any questions you have with your health care provider. Document Revised: 04/29/2018 Document Reviewed: 04/29/2018 Elsevier Patient Education  2020 [employer].

## 2019-11-14 NOTE — Progress Notes (Signed)
Patient ID: Melissa [name], female   DOB: Mar 22, 1991, 29 y.o.   MRN: [id number]   Subjective:    Patient ID: Melissa [name], female    DOB: 10-25-1990, 29 y.o.   MRN: [id number]  Chief Complaint  Patient presents with  . Annual Exam    HPI Patient is in today for annual preventative exam and follow up on chronic medical concerns. She is very depressed and frustrated that her insurance will not cover evaluation with behavioral health. She denies any suicidal ideation but endorses fatigue. She is in today for pap and notes some increased discharge and worries that her husband cheats on her. She is home with her two adopted children ages 39 and 4 years and very isolated. She is recovering from Covid and has been more depressed. Denies CP/palp/SOB/HA/congestion/fevers/GI or GU c/o. Taking meds as prescribed  Past Medical History:  Diagnosis Date  . Abnormal Pap smear of cervix 07/11/2011  . Acid reflux   . Anxiety   . Asthma    activity induced  . Irregular menses   . Polycystic ovarian syndrome     Past Surgical History:  Procedure Laterality Date  . NO PAST SURGERIES      Family History  Problem Relation Age of Onset  . Gallbladder disease Mother   . Colon polyps Mother   . Hypertension Father   . Other Father        Hepatitis A  . Diabetes Father   . Stomach cancer Maternal Grandmother   . Diabetes Maternal Grandmother   . Colon polyps Maternal Grandmother   . Colon cancer Maternal Grandmother   . Cancer Maternal Grandmother        stomach cancer  . Emphysema Maternal Grandfather   . Cancer Maternal Grandfather        smoke, lung cancer  . Heart disease Paternal Grandfather   . Breast cancer Other        mat great aunts x 2  . Colon cancer Other        mat great uncles x 2  . Irritable bowel syndrome Sister   . Multiple sclerosis Sister   . Colon polyps Paternal Uncle   . Pancreatic cancer Neg Hx   . Esophageal cancer Neg Hx     Social History   Socioeconomic  History  . Marital status: Married    Spouse name: Not on file  . Number of children: 0  . Years of education: Not on file  . Highest education level: Not on file  Occupational History  . Occupation: Lexicographer: [employer]   Tobacco Use  . Smoking status: Never Smoker  . Smokeless tobacco: Never Used  Substance and Sexual Activity  . Alcohol use: Not Currently    Alcohol/week: 4.0 - 7.0 standard drinks    Types: 4 - 7 Cans of beer per week    Comment: 1 beer every other ay  . Drug use: Not Currently    Types: Marijuana  . Sexual activity: Yes    Partners: Male    Birth control/protection: None  Other Topics Concern  . Not on file  Social History Narrative   Caffeine use:  1 can soda daily   Regular exercise:  Just started   2 sisters.      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    Social Determinants of Health   Financial Resource Strain:   . Difficulty of Paying Living Expenses:   Food Insecurity:   . Worried About Charity fundraiser in the Last Year:   . Arboriculturist in the Last Year:   Transportation Needs:   . Film/video editor (Medical):   Marland Kitchen Lack of Transportation (Non-Medical):   Physical Activity:   . Days of Exercise per Week:   . Minutes of Exercise per Session:   Stress:   . Feeling of Stress :   Social Connections:   . Frequency of Communication with Friends and Family:   . Frequency of Social Gatherings with Friends and Family:   . Attends Religious Services:   . Active Member of Clubs or Organizations:   . Attends Archivist Meetings:   Marland Kitchen Marital Status:   Intimate Partner Violence:   . Fear of Current or Ex-Partner:   . Emotionally Abused:   Marland Kitchen Physically Abused:   . Sexually Abused:     Outpatient Medications Prior to Visit  Medication Sig Dispense Refill  .  busPIRone (BUSPAR) 10 MG tablet Take 1 tablet (10 mg total) by mouth 3 (three) times daily. 90 tablet 1  . famotidine (PEPCID) 40 MG tablet Take 1 tablet (40 mg total) by mouth at bedtime. 30 tablet 3  . hydrOXYzine (ATARAX/VISTARIL) 25 MG tablet Take 1 tablet (25 mg total) by mouth at bedtime as needed for anxiety. 30 tablet 1  . ondansetron (ZOFRAN) 4 MG tablet Take 1 tablet twice daily for 2 weeks then as needed for nausea 60 tablet 3  . pantoprazole (PROTONIX) 40 MG tablet Take 1 tablet (40 mg total) by mouth daily. 30 tablet 11  . promethazine (PHENERGAN) 25 MG tablet Take 1 tablet (25 mg total) by mouth as needed for nausea or vomiting (during the day). 45 tablet 1  . albuterol (VENTOLIN HFA) 108 (90 Base) MCG/ACT inhaler Inhale 1-2 puffs into the lungs every 6 (six) hours as needed for wheezing or shortness of breath. 8 g 0  . sertraline (ZOLOFT) 50 MG tablet 1/2 tablet by mouth once daily for 1 week, then increase to a full tab once daily on week two. 30 tablet 2  . benzonatate (TESSALON) 100 MG capsule Take 1 capsule (100 mg total) by mouth every 8 (eight) hours. 21 capsule 0  . diphenhydramine-acetaminophen (TYLENOL PM) 25-500 MG TABS tablet Take 1 tablet by mouth at bedtime as needed (sleep).     No facility-administered medications prior to visit.    Allergies  Allergen Reactions  . Benadryl [Diphenhydramine Hcl] Other (See Comments)    Heart races    Review of Systems  Constitutional: Positive for malaise/fatigue. Negative for chills and fever.  HENT: Negative for congestion and hearing loss.   Eyes: Negative for discharge.  Respiratory: Negative for cough, sputum production and shortness of breath.   Cardiovascular: Negative for chest pain, palpitations and leg swelling.  Gastrointestinal: Positive for abdominal pain and heartburn. Negative for blood in stool, constipation, diarrhea, nausea and vomiting.  Genitourinary: Negative for dysuria, frequency, hematuria and urgency.    Musculoskeletal: Negative for back pain, falls and myalgias.  Skin: Negative for rash.  Neurological: Negative for dizziness, sensory change, loss of consciousness, weakness and headaches.  Endo/Heme/Allergies: Negative for environmental allergies. Does not bruise/bleed easily.  Psychiatric/Behavioral: Positive for depression. Negative for suicidal ideas. The patient is nervous/anxious. The patient does not have  insomnia.        Objective:    Physical Exam Constitutional:      General: She is not in acute distress.    Appearance: She is not diaphoretic.  HENT:     Head: Normocephalic and atraumatic.     Right Ear: External ear normal.     Left Ear: External ear normal.     Nose: Nose normal.     Mouth/Throat:     Pharynx: No oropharyngeal exudate.  Eyes:     General: No scleral icterus.       Right eye: No discharge.        Left eye: No discharge.     Conjunctiva/sclera: Conjunctivae normal.     Pupils: Pupils are equal, round, and reactive to light.  Neck:     Thyroid: No thyromegaly.  Cardiovascular:     Rate and Rhythm: Normal rate and regular rhythm.     Heart sounds: Normal heart sounds. No murmur.  Pulmonary:     Effort: Pulmonary effort is normal. No respiratory distress.     Breath sounds: Normal breath sounds. No wheezing or rales.  Abdominal:     General: Bowel sounds are normal. There is no distension.     Palpations: Abdomen is soft. There is no mass.     Tenderness: There is no abdominal tenderness.  Genitourinary:    General: Normal vulva.     Vagina: Vaginal discharge present.     Rectum: Normal.     Comments: Brownish discharge Musculoskeletal:        General: No tenderness. Normal range of motion.     Cervical back: Normal range of motion and neck supple.  Lymphadenopathy:     Cervical: No cervical adenopathy.  Skin:    General: Skin is warm and dry.     Findings: No rash.  Neurological:     Mental Status: She is alert and oriented to person,  place, and time.     Cranial Nerves: No cranial nerve deficit.     Coordination: Coordination normal.     Deep Tendon Reflexes: Reflexes are normal and symmetric. Reflexes normal.     BP 105/69 (BP Location: Right Arm, Cuff Size: Large)   Pulse 81   Temp (!) 96.9 F (36.1 C) (Temporal)   Resp 12   Ht 5\' 2"  (1.575 m)   Wt 241 lb 12.8 oz (109.7 kg)   SpO2 100%   BMI 44.23 kg/m  Wt Readings from Last 3 Encounters:  11/14/19 241 lb 12.8 oz (109.7 kg)  10/25/19 238 lb (108 kg)  09/27/19 220 lb (99.8 kg)    Diabetic Foot Exam - Simple   No data filed     Lab Results  Component Value Date   WBC 14.8 (H) 09/27/2019   HGB 13.1 09/27/2019   HCT 41.4 09/27/2019   PLT 412 (H) 09/27/2019   GLUCOSE 95 09/27/2019   CHOL 177 02/09/2017   TRIG 83 09/24/2019   HDL 46.00 02/09/2017   LDLCALC 116 (H) 02/09/2017   ALT 20 09/27/2019   AST 17 09/27/2019   NA 137 09/27/2019   K 3.6 09/27/2019   CL 104 09/27/2019   CREATININE 0.90 09/27/2019   BUN 13 09/27/2019   CO2 22 09/27/2019   TSH 1.78 02/09/2017   HGBA1C 6.0 09/12/2014    Lab Results  Component Value Date   TSH 1.78 02/09/2017   Lab Results  Component Value Date   WBC 14.8 (H) 09/27/2019  HGB 13.1 09/27/2019   HCT 41.4 09/27/2019   MCV 78.3 (L) 09/27/2019   PLT 412 (H) 09/27/2019   Lab Results  Component Value Date   NA 137 09/27/2019   K 3.6 09/27/2019   CO2 22 09/27/2019   GLUCOSE 95 09/27/2019   BUN 13 09/27/2019   CREATININE 0.90 09/27/2019   BILITOT 0.7 09/27/2019   ALKPHOS 78 09/27/2019   AST 17 09/27/2019   ALT 20 09/27/2019   PROT 7.8 09/27/2019   ALBUMIN 3.4 (L) 09/27/2019   CALCIUM 8.3 (L) 09/27/2019   ANIONGAP 11 09/27/2019   GFR 168.85 02/09/2017   Lab Results  Component Value Date   CHOL 177 02/09/2017   Lab Results  Component Value Date   HDL 46.00 02/09/2017   Lab Results  Component Value Date   LDLCALC 116 (H) 02/09/2017   Lab Results  Component Value Date   TRIG 83  09/24/2019   Lab Results  Component Value Date   CHOLHDL 4 02/09/2017   Lab Results  Component Value Date   HGBA1C 6.0 09/12/2014       Assessment & Plan:   Problem List Items Addressed This Visit    Epigastric pain    Recurrent recently Avoid offending foods, start probiotics. Do not eat large meals in late evening and consider raising head of bed. Started on Carafate and check for H Pylori      Depression    Worsening since COVID infection. She is struggling with anhedonia and low mood. Will increase the Sertraline to 100 mg po daily and continue Buspar which she notes has been helping her.      Relevant Medications   sertraline (ZOLOFT) 100 MG tablet   Asthma    Given refill on Albuterol prn      Relevant Medications   albuterol (VENTOLIN HFA) 108 (90 Base) MCG/ACT inhaler   Other Relevant Orders   Comprehensive metabolic panel   Lipid panel   GERD (gastroesophageal reflux disease)    Continue Pantoprazole and Famotidine       Relevant Medications   ondansetron (ZOFRAN) 4 MG tablet   sucralfate (CARAFATE) 1 GM/10ML suspension   Nausea with vomiting   Relevant Orders   CBC   Comprehensive metabolic panel   Lipid panel   TSH   COVID-19    Educated on Covid and vaccine.       Poor concentration    Her insurance will not cover testing for ADD but low dose Ritalin may help her anhedonia and fatigue from her depression f/u in 3-4 weeks.       Cervical cancer screening - Primary    Pap completed today. Pearline Cables to brown discharge noted. STD testing run      Relevant Orders   Cytology - PAP( [place])   Preventative health care    Patient encouraged to maintain heart healthy diet, regular exercise, adequate sleep. Consider daily probiotics. Take medications as prescribed. Labs ordered and reviewed pap completed today.        Other Visit Diagnoses    Hypocalcemia       Relevant Orders   Comprehensive metabolic panel   VITAMIN D 25 Hydroxy (Vit-D  Deficiency, Fractures)   Epigastric abdominal pain       Relevant Orders   CBC   Comprehensive metabolic panel   Lipid panel   H. pylori antibody, IgG   Possible exposure to STD       Relevant Orders   HIV Antibody (routine  testing w rflx)   Cervicovaginal ancillary only( [place])   Vaginal discharge       Relevant Orders   Cervicovaginal ancillary only( [place])      I have discontinued Malvika Krenz's benzonatate, diphenhydramine-acetaminophen, and sertraline. I am also having her start on ondansetron, sucralfate, sertraline, and methylphenidate. Additionally, I am having her maintain her hydrOXYzine, famotidine, pantoprazole, promethazine, ondansetron, busPIRone, and albuterol.  Meds ordered this encounter  Medications  . ondansetron (ZOFRAN) 4 MG tablet    Sig: Take 1 tablet (4 mg total) by mouth every 8 (eight) hours as needed for nausea or vomiting.    Dispense:  20 tablet    Refill:  0  . sucralfate (CARAFATE) 1 GM/10ML suspension    Sig: Take 10 mLs (1 g total) by mouth 4 (four) times daily -  with meals and at bedtime.    Dispense:  420 mL    Refill:  0  . sertraline (ZOLOFT) 100 MG tablet    Sig: Take 1 tablet (100 mg total) by mouth daily.    Dispense:  30 tablet    Refill:  3  . methylphenidate (RITALIN) 10 MG tablet    Sig: Take 1 tablet (10 mg total) by mouth 2 (two) times daily.    Dispense:  60 tablet    Refill:  0  . albuterol (VENTOLIN HFA) 108 (90 Base) MCG/ACT inhaler    Sig: Inhale 1-2 puffs into the lungs every 6 (six) hours as needed for wheezing or shortness of breath.    Dispense:  18 g    Refill:  2     Penni Homans, MD

## 2019-11-14 NOTE — Telephone Encounter (Signed)
Ok to change Carafate tabs 1 tab po qid disp #120 with 1 rf

## 2019-11-14 NOTE — Assessment & Plan Note (Signed)
Her insurance will not cover testing for ADD but low dose Ritalin may help her anhedonia and fatigue from her depression f/u in 3-4 weeks.

## 2019-11-14 NOTE — Assessment & Plan Note (Signed)
Pap completed today. Melissa [name] to brown discharge noted. STD testing run

## 2019-11-14 NOTE — Assessment & Plan Note (Signed)
Recurrent recently Avoid offending foods, start probiotics. Do not eat large meals in late evening and consider raising head of bed. Started on Carafate and check for H Pylori

## 2019-11-14 NOTE — Assessment & Plan Note (Signed)
Given refill on Albuterol prn

## 2019-11-14 NOTE — Assessment & Plan Note (Signed)
Continue Pantoprazole and Famotidine

## 2019-11-14 NOTE — Assessment & Plan Note (Signed)
Worsening since COVID infection. She is struggling with anhedonia and low mood. Will increase the Sertraline to 100 mg po daily and continue Buspar which she notes has been helping her.

## 2019-11-14 NOTE — Assessment & Plan Note (Signed)
Educated on Covid and vaccine.

## 2019-11-15 ENCOUNTER — Other Ambulatory Visit (INDEPENDENT_AMBULATORY_CARE_PROVIDER_SITE_OTHER): Payer: 59

## 2019-11-15 ENCOUNTER — Encounter: Payer: Self-pay | Admitting: Family Medicine

## 2019-11-15 ENCOUNTER — Other Ambulatory Visit: Payer: Self-pay | Admitting: *Deleted

## 2019-11-15 DIAGNOSIS — E282 Polycystic ovarian syndrome: Secondary | ICD-10-CM | POA: Diagnosis not present

## 2019-11-15 LAB — H. PYLORI ANTIBODY, IGG: H Pylori IgG: NEGATIVE

## 2019-11-15 LAB — HEMOGLOBIN A1C: Hgb A1c MFr Bld: 6 % (ref 4.6–6.5)

## 2019-11-15 LAB — HIV ANTIBODY (ROUTINE TESTING W REFLEX): HIV 1&2 Ab, 4th Generation: NONREACTIVE

## 2019-11-15 MED ORDER — SUCRALFATE 1 G PO TABS: 1.0000 g | ORAL_TABLET | Freq: Four times a day (QID) | ORAL | 1 refills | Status: DC

## 2019-11-15 MED ORDER — VITAMIN D (ERGOCALCIFEROL) 1.25 MG (50000 UNIT) PO CAPS: 50000.0000 [IU] | ORAL_CAPSULE | ORAL | 4 refills | Status: DC

## 2019-11-15 NOTE — Telephone Encounter (Signed)
Rx sent 

## 2019-11-16 ENCOUNTER — Other Ambulatory Visit: Payer: Self-pay | Admitting: Family Medicine

## 2019-11-16 LAB — CERVICOVAGINAL ANCILLARY ONLY
Bacterial Vaginitis (gardnerella): NEGATIVE
Candida Glabrata: NEGATIVE
Candida Vaginitis: POSITIVE — AB
Chlamydia: NEGATIVE
Comment: NEGATIVE
Comment: NEGATIVE
Comment: NEGATIVE
Comment: NEGATIVE
Comment: NEGATIVE
Comment: NORMAL
Neisseria Gonorrhea: NEGATIVE
Trichomonas: NEGATIVE

## 2019-11-16 LAB — CYTOLOGY - PAP: Diagnosis: NEGATIVE

## 2019-11-16 MED ORDER — FLUCONAZOLE 150 MG PO TABS: 150.0000 mg | ORAL_TABLET | ORAL | 1 refills | Status: DC

## 2019-11-17 ENCOUNTER — Encounter: Payer: 59 | Admitting: Family Medicine

## 2019-11-22 ENCOUNTER — Ambulatory Visit: Payer: 59 | Admitting: Gastroenterology

## 2019-11-26 ENCOUNTER — Other Ambulatory Visit: Payer: Self-pay | Admitting: Family Medicine

## 2019-11-28 ENCOUNTER — Ambulatory Visit: Payer: 59 | Admitting: Family Medicine

## 2019-12-08 ENCOUNTER — Telehealth: Payer: Self-pay | Admitting: Family Medicine

## 2019-12-08 DIAGNOSIS — Z111 Encounter for screening for respiratory tuberculosis: Secondary | ICD-10-CM

## 2019-12-08 DIAGNOSIS — Z0184 Encounter for antibody response examination: Secondary | ICD-10-CM

## 2019-12-08 NOTE — Telephone Encounter (Signed)
Patient lost meds for yeast infection if possible . Please Melissa [name]

## 2019-12-08 NOTE — Telephone Encounter (Signed)
Patient left nursing program form to be filled out by Dr. Charlett Blake . Form was dropped off in basket .

## 2019-12-09 NOTE — Telephone Encounter (Signed)
Left detailed message that she should have a refill from rx from 3/17.  If she has not used that then she can call in a refill.  If she has already used please call back or leave a mychart to discuss.

## 2019-12-12 ENCOUNTER — Ambulatory Visit: Payer: 59 | Admitting: Psychology

## 2019-12-13 NOTE — Telephone Encounter (Signed)
Form in yellow folder to be completed

## 2019-12-13 NOTE — Telephone Encounter (Signed)
done

## 2019-12-14 NOTE — Telephone Encounter (Signed)
Per Dr. Charlett Blake she will need tb test and varicella titer.  Patient notified and lab scheduled for the quantiferon gold and varicella titer.  Form will be on desk to attach labs to when they come back.

## 2019-12-15 ENCOUNTER — Other Ambulatory Visit: Payer: Self-pay

## 2019-12-15 ENCOUNTER — Other Ambulatory Visit: Payer: 59

## 2019-12-15 ENCOUNTER — Other Ambulatory Visit (INDEPENDENT_AMBULATORY_CARE_PROVIDER_SITE_OTHER): Payer: 59

## 2019-12-15 DIAGNOSIS — Z111 Encounter for screening for respiratory tuberculosis: Secondary | ICD-10-CM | POA: Diagnosis not present

## 2019-12-15 DIAGNOSIS — Z0184 Encounter for antibody response examination: Secondary | ICD-10-CM

## 2019-12-16 ENCOUNTER — Telehealth: Payer: Self-pay | Admitting: Family Medicine

## 2019-12-16 ENCOUNTER — Encounter: Payer: Self-pay | Admitting: Family Medicine

## 2019-12-16 ENCOUNTER — Encounter: Payer: Self-pay | Admitting: *Deleted

## 2019-12-16 ENCOUNTER — Other Ambulatory Visit: Payer: Self-pay | Admitting: *Deleted

## 2019-12-16 ENCOUNTER — Telehealth (INDEPENDENT_AMBULATORY_CARE_PROVIDER_SITE_OTHER): Payer: 59 | Admitting: Family Medicine

## 2019-12-16 DIAGNOSIS — R41840 Attention and concentration deficit: Secondary | ICD-10-CM | POA: Diagnosis not present

## 2019-12-16 DIAGNOSIS — F32A Depression, unspecified: Secondary | ICD-10-CM

## 2019-12-16 DIAGNOSIS — R000 Tachycardia, unspecified: Secondary | ICD-10-CM | POA: Diagnosis not present

## 2019-12-16 DIAGNOSIS — R110 Nausea: Secondary | ICD-10-CM

## 2019-12-16 DIAGNOSIS — F329 Major depressive disorder, single episode, unspecified: Secondary | ICD-10-CM

## 2019-12-16 MED ORDER — METOPROLOL SUCCINATE ER 25 MG PO TB24: 25.0000 mg | ORAL_TABLET | Freq: Every day | ORAL | 3 refills | Status: DC

## 2019-12-16 MED ORDER — FLUOXETINE HCL 40 MG PO CAPS: 40.0000 mg | ORAL_CAPSULE | Freq: Every day | ORAL | 3 refills | Status: DC

## 2019-12-16 MED ORDER — FAMOTIDINE 40 MG PO TABS: 40.0000 mg | ORAL_TABLET | Freq: Every day | ORAL | 3 refills | Status: DC

## 2019-12-16 MED ORDER — METHYLPHENIDATE HCL 10 MG PO TABS: 10.0000 mg | ORAL_TABLET | Freq: Two times a day (BID) | ORAL | 0 refills | Status: DC

## 2019-12-16 MED ORDER — PROMETHAZINE HCL 25 MG PO TABS: 25.0000 mg | ORAL_TABLET | ORAL | 1 refills | Status: DC | PRN

## 2019-12-16 NOTE — Progress Notes (Signed)
Virtual Visit via phone Note  I connected with Melissa [name] on 12/16/19 at 10:20 AM EDT by a phone enabled telemedicine application and verified that I am speaking with the correct person using two identifiers.  Location: Patient: home Provider: home   I discussed the limitations of evaluation and management by telemedicine and the availability of in person appointments. The patient expressed understanding and agreed to proceed. Kem Boroughs, CMA was able to get the patient setup on phone visit after being unable to set up a video visit    Subjective:    Patient ID: Melissa [name], female    DOB: [birth year], 29 y.o.   MRN: [id number]  Chief Complaint  Patient presents with  . Follow-up  . Medication Refill    HPI Patient is in today for follow up on chronic medical concerns. No recent febrile illness or hospitalizations. She notes her concentration is much better on the Ritalin. No concenring side effects. She notes the Buspar helps her anxiety notably. She feels the sertraline causes her nausea and she is interested in switching meds. Denies CP/palp/SOB/HA/congestion/fevers/GI or GU c/o. Taking meds as prescribed  Past Medical History:  Diagnosis Date  . Abnormal Pap smear of cervix 07/11/2011  . Acid reflux   . Anxiety   . Asthma    activity induced  . Irregular menses   . Polycystic ovarian syndrome     Past Surgical History:  Procedure Laterality Date  . NO PAST SURGERIES      Family History  Problem Relation Age of Onset  . Gallbladder disease Mother   . Colon polyps Mother   . Hypertension Father   . Other Father        Hepatitis A  . Diabetes Father   . Stomach cancer Maternal Grandmother   . Diabetes Maternal Grandmother   . Colon polyps Maternal Grandmother   . Colon cancer Maternal Grandmother   . Cancer Maternal Grandmother        stomach cancer  . Emphysema Maternal Grandfather   . Cancer Maternal Grandfather        smoke, lung cancer  .  Heart disease Paternal Grandfather   . Breast cancer Other        mat great aunts x 2  . Colon cancer Other        mat great uncles x 2  . Irritable bowel syndrome Sister   . Multiple sclerosis Sister   . Colon polyps Paternal Uncle   . Pancreatic cancer Neg Hx   . Esophageal cancer Neg Hx     Social History   Socioeconomic History  . Marital status: Married    Spouse name: Not on file  . Number of children: 0  . Years of education: Not on file  . Highest education level: Not on file  Occupational History  . Occupation: Lexicographer: [employer]   Tobacco Use  . Smoking status: Never Smoker  . Smokeless tobacco: Never Used  Substance and Sexual Activity  . Alcohol use: Not Currently    Alcohol/week: 4.0 - 7.0 standard drinks    Types: 4 - 7 Cans of beer per week    Comment: 1 beer every other ay  . Drug use: Not Currently    Types: Marijuana  . Sexual activity: Yes    Partners: Male    Birth control/protection: None  Other Topics Concern  . Not on file  Social History Narrative   Caffeine use:  1 can soda  daily   Regular exercise:  Just started   2 sisters.      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    Social Determinants of Health   Financial Resource Strain:   . Difficulty of Paying Living Expenses:   Food Insecurity:   . Worried About Charity fundraiser in the Last Year:   . Arboriculturist in the Last Year:   Transportation Needs:   . Film/video editor (Medical):   Marland Kitchen Lack of Transportation (Non-Medical):   Physical Activity:   . Days of Exercise per Week:   . Minutes of Exercise per Session:   Stress:   . Feeling of Stress :   Social Connections:   . Frequency of Communication with Friends and Family:   . Frequency of Social Gatherings with Friends and Family:   . Attends Religious  Services:   . Active Member of Clubs or Organizations:   . Attends Archivist Meetings:   Marland Kitchen Marital Status:   Intimate Partner Violence:   . Fear of Current or Ex-Partner:   . Emotionally Abused:   Marland Kitchen Physically Abused:   . Sexually Abused:     Outpatient Medications Prior to Visit  Medication Sig Dispense Refill  . albuterol (VENTOLIN HFA) 108 (90 Base) MCG/ACT inhaler Inhale 1-2 puffs into the lungs every 6 (six) hours as needed for wheezing or shortness of breath. 18 g 2  . busPIRone (BUSPAR) 10 MG tablet TAKE 1 TABLET BY MOUTH THREE TIMES A DAY 270 tablet 1  . hydrOXYzine (ATARAX/VISTARIL) 25 MG tablet TAKE 1 TABLET (25 MG TOTAL) BY MOUTH AT BEDTIME AS NEEDED FOR ANXIETY. 90 tablet 1  . ondansetron (ZOFRAN) 4 MG tablet Take 1 tablet (4 mg total) by mouth every 8 (eight) hours as needed for nausea or vomiting. 20 tablet 0  . pantoprazole (PROTONIX) 40 MG tablet Take 1 tablet (40 mg total) by mouth daily. 30 tablet 11  . sucralfate (CARAFATE) 1 g tablet Take 1 tablet (1 g total) by mouth 4 (four) times daily. 120 tablet 1  . Vitamin D, Ergocalciferol, (DRISDOL) 1.25 MG (50000 UNIT) CAPS capsule Take 1 capsule (50,000 Units total) by mouth every 7 (seven) days. 4 capsule 4  . famotidine (PEPCID) 40 MG tablet Take 1 tablet (40 mg total) by mouth at bedtime. 30 tablet 3  . fluconazole (DIFLUCAN) 150 MG tablet Take 1 tablet (150 mg total) by mouth once a week. 2 tablet 1  . methylphenidate (RITALIN) 10 MG tablet Take 1 tablet (10 mg total) by mouth 2 (two) times daily. 60 tablet 0  . promethazine (PHENERGAN) 25 MG tablet Take 1 tablet (25 mg total) by mouth as needed for nausea or vomiting (during the day). 45 tablet 1  . sertraline (ZOLOFT) 100 MG tablet Take 1 tablet (100 mg total) by mouth daily. 30 tablet 3   No facility-administered medications prior to visit.    Allergies  Allergen Reactions  . Benadryl [Diphenhydramine Hcl] Other (See Comments)    Heart races     Review of Systems  Constitutional: Positive for malaise/fatigue. Negative for fever.  HENT: Negative for congestion.   Eyes: Negative for blurred vision.  Respiratory: Negative for shortness of breath.   Cardiovascular: Negative for chest pain, palpitations and leg swelling.  Gastrointestinal: Negative for abdominal pain, blood in stool and nausea.  Genitourinary: Negative for dysuria and frequency.  Musculoskeletal: Negative for falls.  Skin: Negative for rash.  Neurological: Negative for dizziness, loss of consciousness and headaches.  Endo/Heme/Allergies: Negative for environmental allergies.  Psychiatric/Behavioral: Negative for depression. The patient is nervous/anxious.        Objective:    Physical Exam unable to obtain via phone visit  BP 112/80 (BP Location: Left Arm, Patient Position: Sitting, Cuff Size: Normal)   Pulse (!) 120   Wt 227 lb (103 kg)   SpO2 99%   BMI 41.52 kg/m  Wt Readings from Last 3 Encounters:  12/16/19 227 lb (103 kg)  11/14/19 241 lb 12.8 oz (109.7 kg)  10/25/19 238 lb (108 kg)    Diabetic Foot Exam - Simple   No data filed     Lab Results  Component Value Date   WBC 5.5 11/14/2019   HGB 12.3 11/14/2019   HCT 37.6 11/14/2019   PLT 361.0 11/14/2019   GLUCOSE 89 11/14/2019   CHOL 208 (H) 11/14/2019   TRIG 100.0 11/14/2019   HDL 48.80 11/14/2019   LDLCALC 139 (H) 11/14/2019   ALT 19 11/14/2019   AST 12 11/14/2019   NA 139 11/14/2019   K 4.1 11/14/2019   CL 103 11/14/2019   CREATININE 0.54 11/14/2019   BUN 12 11/14/2019   CO2 27 11/14/2019   TSH 1.39 11/14/2019   HGBA1C 6.0 11/15/2019    Lab Results  Component Value Date   TSH 1.39 11/14/2019   Lab Results  Component Value Date   WBC 5.5 11/14/2019   HGB 12.3 11/14/2019   HCT 37.6 11/14/2019   MCV 78.7 11/14/2019   PLT 361.0 11/14/2019   Lab Results  Component Value Date   NA 139 11/14/2019   K 4.1 11/14/2019   CO2 27 11/14/2019   GLUCOSE 89 11/14/2019   BUN  12 11/14/2019   CREATININE 0.54 11/14/2019   BILITOT 0.3 11/14/2019   ALKPHOS 69 11/14/2019   AST 12 11/14/2019   ALT 19 11/14/2019   PROT 7.1 11/14/2019   ALBUMIN 4.0 11/14/2019   CALCIUM 8.8 11/14/2019   ANIONGAP 11 09/27/2019   GFR 159.23 11/14/2019   Lab Results  Component Value Date   CHOL 208 (H) 11/14/2019   Lab Results  Component Value Date   HDL 48.80 11/14/2019   Lab Results  Component Value Date   LDLCALC 139 (H) 11/14/2019   Lab Results  Component Value Date   TRIG 100.0 11/14/2019   Lab Results  Component Value Date   CHOLHDL 4 11/14/2019   Lab Results  Component Value Date   HGBA1C 6.0 11/15/2019       Assessment & Plan:   Problem List Items Addressed This Visit    Depression    Is doing much better on her current meds but she notes the Sertraline causes nausea so we change to fluoxetine 40 mg daily. and notes the buspar has helped her anxiety.       Relevant Medications   FLUoxetine (PROZAC) 40 MG capsule   Poor concentration    Doing much better on Ritalin she denies any concerning side effects but her pulse has been elevated usually above 100. Continue Ritalin      Tachycardia    Started on Metoprolol XR 25 mg po daily       Other Visit Diagnoses    Nausea       Relevant Medications  promethazine (PHENERGAN) 25 MG tablet      I have discontinued Analea Dubinsky's sertraline and fluconazole. I am also having her start on metoprolol succinate and FLUoxetine. Additionally, I am having her maintain her pantoprazole, ondansetron, albuterol, sucralfate, Vitamin D (Ergocalciferol), busPIRone, hydrOXYzine, methylphenidate, and promethazine.  Meds ordered this encounter  Medications  . methylphenidate (RITALIN) 10 MG tablet    Sig: Take 1 tablet (10 mg total) by mouth 2 (two) times daily.    Dispense:  60 tablet    Refill:  0  . promethazine (PHENERGAN) 25 MG tablet    Sig: Take 1 tablet (25 mg total) by mouth as needed for nausea or  vomiting (during the day).    Dispense:  45 tablet    Refill:  1  . metoprolol succinate (TOPROL-XL) 25 MG 24 hr tablet    Sig: Take 1 tablet (25 mg total) by mouth daily.    Dispense:  30 tablet    Refill:  3  . FLUoxetine (PROZAC) 40 MG capsule    Sig: Take 1 capsule (40 mg total) by mouth daily.    Dispense:  30 capsule    Refill:  3     I discussed the assessment and treatment plan with the patient. The patient was provided an opportunity to ask questions and all were answered. The patient agreed with the plan and demonstrated an understanding of the instructions.   The patient was advised to call back or seek an in-person evaluation if the symptoms worsen or if the condition fails to improve as anticipated.  I provided 25 minutes of non-face-to-face time during this encounter.   Penni Homans, MD

## 2019-12-16 NOTE — Telephone Encounter (Signed)
Rx sent in

## 2019-12-16 NOTE — Telephone Encounter (Signed)
.  Medication: famotidine (PEPCID) 40 MG tablet [id number]   Has the patient contacted their pharmacy? No. (If no, request that the patient contact the pharmacy for the refill.) (If yes, when and what did the pharmacy advise?)  Preferred Pharmacy (with phone number or street name):  At: [address], [place], [place] [address] [phone]  Agent: Please be advised that RX refills may take up to 3 business days. We ask that you follow-up with your pharmacy.

## 2019-12-16 NOTE — Assessment & Plan Note (Signed)
Doing much better on Ritalin she denies any concerning side effects but her pulse has been elevated usually above 100. Continue Ritalin

## 2019-12-16 NOTE — Assessment & Plan Note (Addendum)
Is doing much better on her current meds but she notes the Sertraline causes nausea so we change to fluoxetine 40 mg daily. and notes the buspar has helped her anxiety.

## 2019-12-16 NOTE — Assessment & Plan Note (Signed)
Started on Metoprolol XR 25 mg po daily

## 2019-12-17 LAB — QUANTIFERON-TB GOLD PLUS
Mitogen-NIL: >10 IU/mL
NIL: 0.01 IU/mL
QuantiFERON-TB Gold Plus: NEGATIVE
TB1-NIL: 0 IU/mL
TB2-NIL: 0 IU/mL

## 2019-12-17 LAB — VARICELLA ZOSTER ANTIBODY, IGG: Varicella IgG: 3636 index

## 2019-12-19 ENCOUNTER — Ambulatory Visit: Payer: 59 | Admitting: Psychology

## 2019-12-20 ENCOUNTER — Encounter: Payer: Self-pay | Admitting: *Deleted

## 2019-12-20 ENCOUNTER — Other Ambulatory Visit: Payer: Self-pay | Admitting: Family Medicine

## 2019-12-20 MED ORDER — SERTRALINE HCL 100 MG PO TABS: 100.0000 mg | ORAL_TABLET | Freq: Every day | ORAL | 3 refills | Status: DC

## 2019-12-20 MED ORDER — BUSPIRONE HCL 15 MG PO TABS: 15.0000 mg | ORAL_TABLET | Freq: Three times a day (TID) | ORAL | 1 refills | Status: DC

## 2019-12-22 ENCOUNTER — Ambulatory Visit: Payer: 59 | Admitting: Psychology

## 2019-12-26 ENCOUNTER — Ambulatory Visit: Payer: 59 | Admitting: Psychology

## 2019-12-27 ENCOUNTER — Other Ambulatory Visit: Payer: Self-pay | Admitting: Family Medicine

## 2019-12-28 ENCOUNTER — Ambulatory Visit: Payer: 59 | Admitting: Gastroenterology

## 2019-12-29 MED ORDER — BUSPIRONE HCL 15 MG PO TABS: 15.0000 mg | ORAL_TABLET | Freq: Three times a day (TID) | ORAL | 1 refills | Status: DC

## 2019-12-29 MED ORDER — SERTRALINE HCL 100 MG PO TABS: 100.0000 mg | ORAL_TABLET | Freq: Every day | ORAL | 3 refills | Status: DC

## 2020-01-06 ENCOUNTER — Encounter: Payer: Self-pay | Admitting: Family Medicine

## 2020-01-09 ENCOUNTER — Other Ambulatory Visit: Payer: Self-pay | Admitting: Family Medicine

## 2020-01-09 DIAGNOSIS — F329 Major depressive disorder, single episode, unspecified: Secondary | ICD-10-CM

## 2020-01-09 DIAGNOSIS — F419 Anxiety disorder, unspecified: Secondary | ICD-10-CM

## 2020-01-09 DIAGNOSIS — R41840 Attention and concentration deficit: Secondary | ICD-10-CM

## 2020-01-09 DIAGNOSIS — F32A Depression, unspecified: Secondary | ICD-10-CM

## 2020-01-10 ENCOUNTER — Telehealth: Payer: 59 | Admitting: Physician Assistant

## 2020-01-10 DIAGNOSIS — J019 Acute sinusitis, unspecified: Secondary | ICD-10-CM | POA: Diagnosis not present

## 2020-01-10 MED ORDER — AZELASTINE HCL 0.15 % NA SOLN: 2.0000 | Freq: Two times a day (BID) | NASAL | 0 refills | Status: DC

## 2020-01-10 NOTE — Progress Notes (Signed)
We are sorry that you are not feeling well.  Here is how we plan to help!  Based on what you have shared with me it looks like you have sinusitis.  Sinusitis is inflammation and infection in the sinus cavities of the head.  Based on your presentation I believe you most likely have Acute Viral Sinusitis.This is an infection most likely caused by a virus. There is not specific treatment for viral sinusitis other than to help you with the symptoms until the infection runs its course.  You may use an oral decongestant such as Mucinex D or if you have glaucoma or high blood pressure use plain Mucinex. Saline nasal spray help and can safely be used as often as needed for congestion, I have prescribed: Azelastine nasal spray 2 sprays in each nostril twice a day  Some authorities believe that zinc sprays or the use of Echinacea may shorten the course of your symptoms.  Sinus infections are not as easily transmitted as other respiratory infection, however we still recommend that you avoid close contact with loved ones, especially the very young and elderly.  Remember to wash your hands thoroughly throughout the day as this is the number one way to prevent the spread of infection!  Home Care:  Only take medications as instructed by your medical team.  Do not take these medications with alcohol.  A steam or ultrasonic humidifier can help congestion.  You can place a towel over your head and breathe in the steam from hot water coming from a faucet.  Avoid close contacts especially the very young and the elderly.  Cover your mouth when you cough or sneeze.  Always remember to wash your hands.  Get Help Right Away If:  You develop worsening fever or sinus pain.  You develop a severe head ache or visual changes.  Your symptoms persist after you have completed your treatment plan.  Make sure you  Understand these instructions.  Will watch your condition.  Will get help right away if you are not  doing well or get worse.  Your e-visit answers were reviewed by a board certified advanced clinical practitioner to complete your personal care plan.  Depending on the condition, your plan could have included both over the counter or prescription medications.  If there is a problem please reply  once you have received a response from your provider.  Your safety is important to [place].  If you have drug allergies check your prescription carefully.    You can use MyChart to ask questions about today's visit, request a non-urgent call back, or ask for a work or school excuse for 24 hours related to this e-Visit. If it has been greater than 24 hours you will need to follow up with your provider, or enter a new e-Visit to address those concerns.  You will get an e-mail in the next two days asking about your experience.  I hope that your e-visit has been valuable and will speed your recovery. Thank you for using e-visits.   5 minutes spent on chart

## 2020-01-16 ENCOUNTER — Encounter: Payer: Self-pay | Admitting: Family Medicine

## 2020-01-16 MED ORDER — BUSPIRONE HCL 15 MG PO TABS: 15.0000 mg | ORAL_TABLET | Freq: Three times a day (TID) | ORAL | 1 refills | Status: DC

## 2020-01-16 MED ORDER — FAMOTIDINE 40 MG PO TABS: 40.0000 mg | ORAL_TABLET | Freq: Every day | ORAL | 3 refills | Status: DC

## 2020-01-23 ENCOUNTER — Other Ambulatory Visit: Payer: Self-pay | Admitting: Family Medicine

## 2020-01-23 MED ORDER — METHYLPHENIDATE HCL 10 MG PO TABS: 10.0000 mg | ORAL_TABLET | Freq: Two times a day (BID) | ORAL | 0 refills | Status: DC

## 2020-01-23 MED ORDER — ONDANSETRON HCL 4 MG PO TABS: 4.0000 mg | ORAL_TABLET | Freq: Three times a day (TID) | ORAL | 0 refills | Status: DC | PRN

## 2020-01-24 ENCOUNTER — Ambulatory Visit: Payer: 59 | Admitting: Family Medicine

## 2020-01-26 MED ORDER — HYDROXYZINE HCL 25 MG PO TABS: 25.0000 mg | ORAL_TABLET | Freq: Every evening | ORAL | 1 refills | Status: DC | PRN

## 2020-01-26 NOTE — Addendum Note (Signed)
Addended byDamita Dunnings D on: 01/26/2020 09:47 AM   Modules accepted: Orders

## 2020-02-08 ENCOUNTER — Encounter: Payer: Self-pay | Admitting: Family Medicine

## 2020-02-09 ENCOUNTER — Encounter: Payer: Self-pay | Admitting: *Deleted

## 2020-02-09 NOTE — Telephone Encounter (Signed)
If patient responds that this correct email then can print out letter in chart.

## 2020-03-01 ENCOUNTER — Ambulatory Visit: Payer: 59 | Admitting: Psychologist

## 2020-03-07 ENCOUNTER — Other Ambulatory Visit: Payer: Self-pay | Admitting: *Deleted

## 2020-03-08 ENCOUNTER — Other Ambulatory Visit: Payer: Self-pay | Admitting: *Deleted

## 2020-03-08 MED ORDER — SERTRALINE HCL 100 MG PO TABS: 100.0000 mg | ORAL_TABLET | Freq: Every day | ORAL | 1 refills | Status: DC

## 2020-03-08 MED ORDER — BUSPIRONE HCL 15 MG PO TABS: 15.0000 mg | ORAL_TABLET | Freq: Three times a day (TID) | ORAL | 1 refills | Status: DC

## 2020-03-08 MED ORDER — METOPROLOL SUCCINATE ER 25 MG PO TB24: 25.0000 mg | ORAL_TABLET | Freq: Every day | ORAL | 1 refills | Status: DC

## 2020-03-09 ENCOUNTER — Other Ambulatory Visit: Payer: Self-pay | Admitting: *Deleted

## 2020-03-09 ENCOUNTER — Ambulatory Visit (INDEPENDENT_AMBULATORY_CARE_PROVIDER_SITE_OTHER): Payer: 59 | Admitting: Psychologist

## 2020-03-09 DIAGNOSIS — F321 Major depressive disorder, single episode, moderate: Secondary | ICD-10-CM | POA: Diagnosis not present

## 2020-03-09 DIAGNOSIS — F411 Generalized anxiety disorder: Secondary | ICD-10-CM | POA: Diagnosis not present

## 2020-03-09 MED ORDER — BUSPIRONE HCL 15 MG PO TABS: 15.0000 mg | ORAL_TABLET | Freq: Three times a day (TID) | ORAL | 1 refills | Status: DC

## 2020-03-12 ENCOUNTER — Other Ambulatory Visit: Payer: Self-pay

## 2020-03-12 ENCOUNTER — Telehealth (INDEPENDENT_AMBULATORY_CARE_PROVIDER_SITE_OTHER): Payer: 59 | Admitting: Psychiatry

## 2020-03-12 ENCOUNTER — Encounter (HOSPITAL_COMMUNITY): Payer: Self-pay | Admitting: Psychiatry

## 2020-03-12 DIAGNOSIS — F32A Depression, unspecified: Secondary | ICD-10-CM

## 2020-03-12 DIAGNOSIS — F410 Panic disorder [episodic paroxysmal anxiety] without agoraphobia: Secondary | ICD-10-CM

## 2020-03-12 DIAGNOSIS — F329 Major depressive disorder, single episode, unspecified: Secondary | ICD-10-CM | POA: Diagnosis not present

## 2020-03-12 DIAGNOSIS — F909 Attention-deficit hyperactivity disorder, unspecified type: Secondary | ICD-10-CM | POA: Diagnosis not present

## 2020-03-12 DIAGNOSIS — F411 Generalized anxiety disorder: Secondary | ICD-10-CM | POA: Diagnosis not present

## 2020-03-12 MED ORDER — ZOLPIDEM TARTRATE ER 6.25 MG PO TBCR: 6.2500 mg | EXTENDED_RELEASE_TABLET | Freq: Every evening | ORAL | 0 refills | Status: DC | PRN

## 2020-03-12 MED ORDER — METHYLPHENIDATE HCL 20 MG PO TABS: 20.0000 mg | ORAL_TABLET | Freq: Three times a day (TID) | ORAL | 0 refills | Status: DC

## 2020-03-12 MED ORDER — LORAZEPAM 1 MG PO TABS: 1.0000 mg | ORAL_TABLET | Freq: Two times a day (BID) | ORAL | 0 refills | Status: DC | PRN

## 2020-03-12 NOTE — Progress Notes (Signed)
Psychiatric Initial Adult Assessment   Patient Identification: Kortny Lirette MRN:  [id number] Date of Evaluation:  03/12/2020 Referral Source: Penni Homans MD Chief Complaint:   Chief [place] was conducted using videoconferencing application and I verified that I was speaking with the correct person using two identifiers. I discussed the limitations of evaluation and management by telemedicine and  the availability of in person appointments. Patient expressed understanding and agreed to proceed. Patient location - home; physician - home office.  Visit Diagnosis:    ICD-10-CM   1. GAD (generalized anxiety disorder)  F41.1   2. Panic disorder  F41.0   3. Adult ADHD  F90.9   4. Depression, unspecified depression type  F32.9     History of Present Illness:  Racquel Arkin is a 29 yo MAAF who has been referred to [place] by her PCP for treatment of depression/anxiety and problems with concentration, task completion, forgetfulness consistent with inattentive form of ADHD. Michaeleen has never been under care of psychiatry - Dr. Charlett Blake has started her on a combination of sertraline and buspirone in January thus year to help with depression and anxiety. 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 She has been feeling overwhelmed, worrying, depressed, distractible, having racing thoughts, difficulty falling asleep and struggling with sustaining attention, procrastination, task completion, forgetfulness.  She also has panic attacks which have become more frequent lately - occurring daily. 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 She denies having hx of mania, psychosis. She only drinks alcohol occasionally (socially). She has no hx of feeling suicidal, no inpatient psychiatric  admissions.   Dannika reports that buspirone has helped with anxiety whereas she does not see clear decrease in depression since starting sertraline (currentky on 100 mg). She was briefly switched to fluoxetine 40 mg but it boosted her appetite (she is obese) so it was stopped. She has not been tried on any other psychotropic meds prior to sertraline. She is prescribed 25 mng of hydroxyzine for anxiety/insonia but it is not effective. In March she was started on methylphenidate 10  g bid for adult ASHD with some benefit. She feels that it could work better and is not effective long enough on some days and she worries if it will be covering longer days when her school starts in August.  Medcial hx has been reviewed.   Associated Signs/Symptoms: Depression Symptoms:  depressed mood, insomnia, fatigue, difficulty concentrating, anxiety, panic attacks, (Hypo) Manic Symptoms:  Irritable Mood, Anxiety Symptoms:  Excessive Worry, Panic Symptoms, Psychotic Symptoms:  None PTSD Symptoms: Negative  Past Psychiatric History: See above  Previous Psychotropic Medications: No   Substance Abuse History in the last 12 months:  No.  Consequences of Substance Abuse: NA  Past Medical History:  Past Medical History:  Diagnosis Date  . Abnormal Pap smear of cervix 07/11/2011  . Acid reflux   . Anxiety   . Asthma    activity induced  . Irregular menses   . Polycystic ovarian syndrome     Past Surgical History:  Procedure Laterality Date  . NO PAST SURGERIES      Family Psychiatric History: Reviewed.  Family History:  Family History  Problem Relation Age of Onset  . Gallbladder disease  Mother   . Colon polyps Mother   . Hypertension Father   . Other Father        Hepatitis A  . Diabetes Father   . Drug abuse Father   . Post-traumatic stress disorder Father   . Stomach cancer Maternal Grandmother   . Diabetes Maternal Grandmother   . Colon polyps Maternal Grandmother   . Colon  cancer Maternal Grandmother   . Cancer Maternal Grandmother        stomach cancer  . Alcohol abuse Maternal Grandmother   . Emphysema Maternal Grandfather   . Cancer Maternal Grandfather        smoke, lung cancer  . Alcohol abuse Maternal Grandfather   . Heart disease Paternal Grandfather   . Alcohol abuse Paternal Grandfather   . Breast cancer Other        mat great aunts x 2  . Colon cancer Other        mat great uncles x 2  . Irritable bowel syndrome Sister   . Multiple sclerosis Sister   . Bipolar disorder Sister   . Colon polyps Paternal Uncle   . Alcohol abuse Paternal Grandmother   . Pancreatic cancer Neg Hx   . Esophageal cancer Neg Hx     Social History:   Social History   Socioeconomic History  . Marital status: Married    Spouse name: Not on file  . Number of children: 0  . Years of education: Not on file  . Highest education level: Not on file  Occupational History  . Occupation: Lexicographer: [employer]   Tobacco Use  . Smoking status: Never Smoker  . Smokeless tobacco: Never Used  Vaping Use  . Vaping Use: Never used  Substance and Sexual Activity  . Alcohol use: Not Currently    Alcohol/week: 4.0 - 7.0 standard drinks    Types: 4 - 7 Cans of beer per week    Comment: 1 beer every other ay  . Drug use: Not Currently    Types: Marijuana  . Sexual activity: Yes    Partners: Male    Birth control/protection: None  Other Topics Concern  . Not on file  Social History Narrative   Caffeine use:  1 can soda daily   Regular exercise:  Just started   2 sisters.      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    Social Determinants of Health   Financial Resource Strain:   . Difficulty of Paying Living Expenses:   Food Insecurity:   . Worried About Charity fundraiser in the Last Year:   . Arts development officer in the Last Year:   Transportation Needs:   . Film/video editor (Medical):   Marland Kitchen Lack of Transportation (Non-Medical):   Physical Activity:   . Days of Exercise per Week:   . Minutes of Exercise per Session:   Stress:   . Feeling of Stress :   Social Connections:   . Frequency of Communication with Friends and Family:   . Frequency of Social Gatherings with Friends and Family:   . Attends Religious Services:   . Active Member of Clubs or Organizations:   . Attends Archivist Meetings:   Marland Kitchen Marital Status:  Allergies:   Allergies  Allergen Reactions  . Benadryl [Diphenhydramine Hcl] Other (See Comments)    Heart races    Metabolic Disorder Labs: Lab Results  Component Value Date   HGBA1C 6.0 11/15/2019   No results found for: PROLACTIN Lab Results  Component Value Date   CHOL 208 (H) 11/14/2019   TRIG 100.0 11/14/2019   HDL 48.80 11/14/2019   CHOLHDL 4 11/14/2019   VLDL 20.0 11/14/2019   LDLCALC 139 (H) 11/14/2019   LDLCALC 116 (H) 02/09/2017   Lab Results  Component Value Date   TSH 1.39 11/14/2019    Therapeutic Level Labs: No results found for: LITHIUM No results found for: CBMZ No results found for: VALPROATE  Current Medications: Current Outpatient Medications  Medication Sig Dispense Refill  . albuterol (VENTOLIN HFA) 108 (90 Base) MCG/ACT inhaler Inhale 1-2 puffs into the lungs every 6 (six) hours as needed for wheezing or shortness of breath. 18 g 2  . Azelastine HCl 0.15 % SOLN Place 2 sprays into the nose 2 (two) times daily for 7 days. 2.8 mL 0  . busPIRone (BUSPAR) 15 MG tablet Take 1 tablet (15 mg total) by mouth 3 (three) times daily. 270 tablet 1  . famotidine (PEPCID) 40 MG tablet Take 1 tablet (40 mg total) by mouth at bedtime. 30 tablet 3  . hydrOXYzine (ATARAX/VISTARIL) 25 MG tablet Take 1 tablet (25 mg total) by mouth at bedtime as needed for anxiety. 90 tablet 1  . LORazepam (ATIVAN) 1 MG tablet Take 1 tablet  (1 mg total) by mouth 2 (two) times daily as needed for anxiety. 60 tablet 0  . methylphenidate (RITALIN) 20 MG tablet Take 1 tablet (20 mg total) by mouth 3 (three) times daily with meals. 90 tablet 0  . metoprolol succinate (TOPROL-XL) 25 MG 24 hr tablet Take 1 tablet (25 mg total) by mouth daily. 90 tablet 1  . ondansetron (ZOFRAN) 4 MG tablet Take 1 tablet (4 mg total) by mouth every 8 (eight) hours as needed for nausea or vomiting. 20 tablet 0  . pantoprazole (PROTONIX) 40 MG tablet Take 1 tablet (40 mg total) by mouth daily. 30 tablet 11  . promethazine (PHENERGAN) 25 MG tablet Take 1 tablet (25 mg total) by mouth as needed for nausea or vomiting (during the day). 45 tablet 1  . sertraline (ZOLOFT) 100 MG tablet Take 1 tablet (100 mg total) by mouth daily. 90 tablet 1  . sucralfate (CARAFATE) 1 g tablet Take 1 tablet (1 g total) by mouth 4 (four) times daily. 120 tablet 1  . Vitamin D, Ergocalciferol, (DRISDOL) 1.25 MG (50000 UNIT) CAPS capsule Take 1 capsule (50,000 Units total) by mouth every 7 (seven) days. 4 capsule 4  . zolpidem (AMBIEN CR) 6.25 MG CR tablet Take 1 tablet (6.25 mg total) by mouth at bedtime as needed for sleep. 30 tablet 0   No current facility-administered medications for this visit.    Psychiatric Specialty Exam: Review of Systems  Psychiatric/Behavioral: Positive for decreased concentration and sleep disturbance. The patient is nervous/anxious.   All other systems reviewed and are negative.   There were no vitals taken for this visit.There is no height or weight on file to calculate BMI.  General Appearance: Casual and Fairly Groomed  Eye Contact:  Good  Speech:  Clear and Coherent and Normal Rate  Volume:  Normal  Mood:  Anxious and Depressed  Affect:  Tearful  Thought Process:  Goal Directed  Orientation:  Full (Time,  Place, and Person)  Thought Content:  Logical  Suicidal Thoughts:  No  Homicidal Thoughts:  No  Memory:  Immediate;   Fair Recent;    Good Remote;   Good  Judgement:  Good  Insight:  Good  Psychomotor Activity:  Normal  Concentration:  Concentration: Fair  Recall:  [place] of Knowledge:Good  Language: Good  Akathisia:  Negative  Handed:  Right  AIMS (if indicated):  not done  Assets:  Communication Skills Desire for Improvement Financial Resources/Insurance Housing Resilience Talents/Skills  ADL's:  Intact  Cognition: WNL  Sleep:  Fair   Screenings: PHQ2-9     Office Visit from 11/14/2019 in [facility] Visit from 01/31/2016 in [place] at [facility]  PHQ-2 Total Score 6 0  PHQ-9 Total Score 24 -      Assessment and Plan: 29 yo MAAF who has been referred to [place] by her PCP for treatment of depression/anxiety and problems with concentration, task completion, forgetfulness consistent with inattentive form of ADHD. Jodine has never been under care of psychiatry - Dr. Charlett Blake has started her on a combination of sertraline and buspirone in January thus year to help with depression and anxiety. 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 She has been feeling overwhelmed, worrying, depressed, distractible, having racing thoughts, difficulty falling asleep and struggling with sustaining attention, procrastination, task completion, forgetfulness.  She also has panic attacks which have become more frequent lately - occurring daily. 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 She denies having hx of mania, psychosis. She only drinks alcohol occasionally (socially). She has no hx of feeling suicidal, no inpatient psychiatric admissions.   Garnett reports that buspirone has helped with anxiety whereas she does not see clear decrease in depression since  starting sertraline (currentky on 100 mg). She was briefly switched to fluoxetine 40 mg but it boosted her appetite (she is obese) so it was stopped. She has not been tried on any other psychotropic meds prior to sertraline. She is prescribed 25 mng of hydroxyzine for anxiety/insonia but it is not effective. In March she was started on methylphenidate 10  g bid for adult ASHD with some benefit. She feels that it could work better and is not effective long enough on some days and she worries if it will be covering longer days when her school starts in August.  Dx: Mixed anxiety (GAD, panic disorder); Depressive disorder unspecified; Adult ADHD  Plan: We will continue buspirone 15 mg tid, increase sertraline to 150 mg x 1 week then to 200 mg, increase methylphenidate to 20 mg tid (bid scheduled one additional 20 mg as needed), stop hydroxyzine and add zolpidem CR 6.25 mg at HS prn insomnia and lorazepam 1 mg bid prn panic attacks. Next visit in 4 weeks. The plan was discussed with patient who had an opportunity to ask questions and these were all answered. I spend 60 minutes in videoconferencing  with the patient and devoted approximately 50% of this time to explanation of diagnosis, discussion of treatment options and med education.  Stephanie Acre, MD 7/12/20213:01 PM

## 2020-03-13 ENCOUNTER — Ambulatory Visit (INDEPENDENT_AMBULATORY_CARE_PROVIDER_SITE_OTHER): Payer: 59 | Admitting: Psychologist

## 2020-03-13 ENCOUNTER — Other Ambulatory Visit (HOSPITAL_COMMUNITY): Payer: Self-pay | Admitting: Psychiatry

## 2020-03-13 DIAGNOSIS — F411 Generalized anxiety disorder: Secondary | ICD-10-CM

## 2020-03-13 DIAGNOSIS — F321 Major depressive disorder, single episode, moderate: Secondary | ICD-10-CM | POA: Diagnosis not present

## 2020-03-13 MED ORDER — ZOLPIDEM TARTRATE ER 6.25 MG PO TBCR: 6.2500 mg | EXTENDED_RELEASE_TABLET | Freq: Every evening | ORAL | 0 refills | Status: DC | PRN

## 2020-03-13 MED ORDER — SERTRALINE HCL 100 MG PO TABS: 100.0000 mg | ORAL_TABLET | Freq: Every day | ORAL | 1 refills | Status: DC

## 2020-03-13 MED ORDER — LORAZEPAM 1 MG PO TABS: 1.0000 mg | ORAL_TABLET | Freq: Two times a day (BID) | ORAL | 0 refills | Status: DC | PRN

## 2020-03-13 MED ORDER — METHYLPHENIDATE HCL 20 MG PO TABS: 20.0000 mg | ORAL_TABLET | Freq: Three times a day (TID) | ORAL | 0 refills | Status: DC

## 2020-03-23 ENCOUNTER — Ambulatory Visit (INDEPENDENT_AMBULATORY_CARE_PROVIDER_SITE_OTHER): Payer: 59 | Admitting: Psychologist

## 2020-03-23 DIAGNOSIS — F321 Major depressive disorder, single episode, moderate: Secondary | ICD-10-CM | POA: Diagnosis not present

## 2020-03-23 DIAGNOSIS — F411 Generalized anxiety disorder: Secondary | ICD-10-CM | POA: Diagnosis not present

## 2020-03-26 ENCOUNTER — Other Ambulatory Visit: Payer: 59

## 2020-04-03 ENCOUNTER — Ambulatory Visit: Payer: 59 | Admitting: Psychologist

## 2020-04-03 ENCOUNTER — Ambulatory Visit: Payer: 59 | Admitting: Psychology

## 2020-04-06 ENCOUNTER — Ambulatory Visit (INDEPENDENT_AMBULATORY_CARE_PROVIDER_SITE_OTHER): Payer: 59 | Admitting: Psychologist

## 2020-04-06 DIAGNOSIS — F321 Major depressive disorder, single episode, moderate: Secondary | ICD-10-CM

## 2020-04-06 DIAGNOSIS — F411 Generalized anxiety disorder: Secondary | ICD-10-CM | POA: Diagnosis not present

## 2020-04-11 ENCOUNTER — Other Ambulatory Visit: Payer: Self-pay

## 2020-04-11 ENCOUNTER — Telehealth (INDEPENDENT_AMBULATORY_CARE_PROVIDER_SITE_OTHER): Payer: 59 | Admitting: Psychiatry

## 2020-04-11 DIAGNOSIS — F329 Major depressive disorder, single episode, unspecified: Secondary | ICD-10-CM | POA: Diagnosis not present

## 2020-04-11 DIAGNOSIS — F909 Attention-deficit hyperactivity disorder, unspecified type: Secondary | ICD-10-CM | POA: Diagnosis not present

## 2020-04-11 DIAGNOSIS — F411 Generalized anxiety disorder: Secondary | ICD-10-CM | POA: Diagnosis not present

## 2020-04-11 DIAGNOSIS — F410 Panic disorder [episodic paroxysmal anxiety] without agoraphobia: Secondary | ICD-10-CM | POA: Diagnosis not present

## 2020-04-11 DIAGNOSIS — F32A Depression, unspecified: Secondary | ICD-10-CM

## 2020-04-11 MED ORDER — DULOXETINE HCL 30 MG PO CPEP: ORAL_CAPSULE | ORAL | 0 refills | Status: DC

## 2020-04-11 MED ORDER — LORAZEPAM 1 MG PO TABS: 1.0000 mg | ORAL_TABLET | Freq: Two times a day (BID) | ORAL | 1 refills | Status: DC | PRN

## 2020-04-11 MED ORDER — METHYLPHENIDATE HCL 20 MG PO TABS
30.0000 mg | ORAL_TABLET | Freq: Two times a day (BID) | ORAL | 0 refills | Status: DC
Start: 2020-05-11 — End: 2020-05-17

## 2020-04-11 MED ORDER — METHYLPHENIDATE HCL 20 MG PO TABS
30.0000 mg | ORAL_TABLET | Freq: Two times a day (BID) | ORAL | 0 refills | Status: DC
Start: 2020-06-10 — End: 2020-05-17

## 2020-04-11 MED ORDER — ZOLPIDEM TARTRATE ER 12.5 MG PO TBCR: 12.5000 mg | EXTENDED_RELEASE_TABLET | Freq: Every evening | ORAL | 1 refills | Status: DC | PRN

## 2020-04-11 MED ORDER — METHYLPHENIDATE HCL 20 MG PO TABS
30.0000 mg | ORAL_TABLET | Freq: Two times a day (BID) | ORAL | 0 refills | Status: DC
Start: 2020-04-11 — End: 2020-05-17

## 2020-04-11 NOTE — Progress Notes (Signed)
BH MD/PA/NP OP Progress Note  04/11/2020 1:27 PM Melissa [name]  MRN:  [id number] Interview was conducted using videoconferencing application and I verified that I was speaking with the correct person using two identifiers. I discussed the limitations of evaluation and management by telemedicine and  the availability of in person appointments. Patient expressed understanding and agreed to proceed. Patient location - home; physician - home office.  Chief Complaint: Anxiety, insomnia (middle).  HPI: 29 yo MAAF who has been referred to [place] by her PCP for treatment of depression/anxiety and problems with concentration, task completion, forgetfulness consistent with inattentive form of ADHD. Melissa [name] has never been under care of psychiatry - Dr. Charlett Blake has started her on a combination of sertraline and buspirone in January thus year to help with depression and anxiety. 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 She has been feeling overwhelmed, worrying, depressed, distractible, having racing thoughts, difficulty falling asleep and struggling with sustaining attention, procrastination, task completion, forgetfulness.  She also has panic attacks which have become more frequent lately - occurring daily. 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 Melissa [name] reports that buspirone has helped with anxiety whereas she does not see clear decrease in depression since starting sertraline (even after we increased it to 200 mg). She was briefly switched to fluoxetine 40 mg but it boosted her appetite (she is obese) so it was stopped. She has also tried citalopram w/o benefit prior to sertraline. In March she was started on methylphenidate 10  g bid for adult ASHD with some benefit. Wwe increased dose to 200 mg and it seems to  work better. Ambien CR 6.25 mg is minimally helpful for middle insomnia.    Visit Diagnosis:    ICD-10-CM   1. GAD (generalized anxiety disorder)  F41.1   2. Adult ADHD  F90.9   3. Panic disorder  F41.0   4. Depression, unspecified depression type  F32.9     Past Psychiatric History: Please see intake H&P.  Past Medical History:  Past Medical History:  Diagnosis Date  . Abnormal Pap smear of cervix 07/11/2011  . Acid reflux   . Anxiety   . Asthma    activity induced  . Irregular menses   . Polycystic ovarian syndrome     Past Surgical History:  Procedure Laterality Date  . NO PAST SURGERIES      Family Psychiatric History: Reviewed.  Family History:  Family History  Problem Relation Age of Onset  . Gallbladder disease Mother   . Colon polyps Mother   . Hypertension Father   . Other Father        Hepatitis A  . Diabetes Father   . Drug abuse Father   . Post-traumatic stress disorder Father   . Stomach cancer Maternal Grandmother   . Diabetes Maternal Grandmother   . Colon polyps Maternal Grandmother   . Colon cancer Maternal Grandmother   . Cancer Maternal Grandmother        stomach cancer  . Alcohol abuse Maternal Grandmother   . Emphysema Maternal Grandfather   . Cancer Maternal Grandfather        smoke, lung cancer  . Alcohol abuse Maternal Grandfather   . Heart disease Paternal Grandfather   . Alcohol abuse Paternal Grandfather   .  Breast cancer Other        mat great aunts x 2  . Colon cancer Other        mat great uncles x 2  . Irritable bowel syndrome Sister   . Multiple sclerosis Sister   . Bipolar disorder Sister   . Colon polyps Paternal Uncle   . Alcohol abuse Paternal Grandmother   . Pancreatic cancer Neg Hx   . Esophageal cancer Neg Hx     Social History:  Social History   Socioeconomic History  . Marital status: Married    Spouse name: Not on file  . Number of children: 0  . Years of education: Not on file  . Highest education  level: Not on file  Occupational History  . Occupation: Lexicographer: [employer]   Tobacco Use  . Smoking status: Never Smoker  . Smokeless tobacco: Never Used  Vaping Use  . Vaping Use: Never used  Substance and Sexual Activity  . Alcohol use: Not Currently    Alcohol/week: 4.0 - 7.0 standard drinks    Types: 4 - 7 Cans of beer per week    Comment: 1 beer every other ay  . Drug use: Not Currently    Types: Marijuana  . Sexual activity: Yes    Partners: Male    Birth control/protection: None  Other Topics Concern  . Not on file  Social History Narrative   Caffeine use:  1 can soda daily   Regular exercise:  Just started   2 sisters.      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    Social Determinants of Health   Financial Resource Strain:   . Difficulty of Paying Living Expenses:   Food Insecurity:   . Worried About Charity fundraiser in the Last Year:   . Arboriculturist in the Last Year:   Transportation Needs:   . Film/video editor (Medical):   Marland Kitchen Lack of Transportation (Non-Medical):   Physical Activity:   . Days of Exercise per Week:   . Minutes of Exercise per Session:   Stress:   . Feeling of Stress :   Social Connections:   . Frequency of Communication with Friends and Family:   . Frequency of Social Gatherings with Friends and Family:   . Attends Religious Services:   . Active Member of Clubs or Organizations:   . Attends Archivist Meetings:   Marland Kitchen Marital Status:     Allergies:  Allergies  Allergen Reactions  . Benadryl [Diphenhydramine Hcl] Other (See Comments)    Heart races    Metabolic Disorder Labs: Lab Results  Component Value Date   HGBA1C 6.0 11/15/2019   No results found for: PROLACTIN Lab Results  Component Value Date   CHOL 208 (H) 11/14/2019   TRIG 100.0  11/14/2019   HDL 48.80 11/14/2019   CHOLHDL 4 11/14/2019   VLDL 20.0 11/14/2019   LDLCALC 139 (H) 11/14/2019   LDLCALC 116 (H) 02/09/2017   Lab Results  Component Value Date   TSH 1.39 11/14/2019   TSH 1.78 02/09/2017    Therapeutic Level Labs: No results found for: LITHIUM No results found for: VALPROATE No components found for:  CBMZ  Current Medications: Current Outpatient Medications  Medication Sig Dispense  Refill  . albuterol (VENTOLIN HFA) 108 (90 Base) MCG/ACT inhaler Inhale 1-2 puffs into the lungs every 6 (six) hours as needed for wheezing or shortness of breath. 18 g 2  . Azelastine HCl 0.15 % SOLN Place 2 sprays into the nose 2 (two) times daily for 7 days. 2.8 mL 0  . busPIRone (BUSPAR) 15 MG tablet Take 1 tablet (15 mg total) by mouth 3 (three) times daily. 270 tablet 1  . DULoxetine (CYMBALTA) 30 MG capsule Take 1 capsule (30 mg total) by mouth daily for 7 days, THEN 2 capsules (60 mg total) daily. 67 capsule 0  . famotidine (PEPCID) 40 MG tablet Take 1 tablet (40 mg total) by mouth at bedtime. 30 tablet 3  . hydrOXYzine (ATARAX/VISTARIL) 25 MG tablet Take 1 tablet (25 mg total) by mouth at bedtime as needed for anxiety. 90 tablet 1  . LORazepam (ATIVAN) 1 MG tablet Take 1 tablet (1 mg total) by mouth 2 (two) times daily as needed for anxiety. 60 tablet 1  . methylphenidate (RITALIN) 20 MG tablet Take 1 tablet (20 mg total) by mouth 3 (three) times daily with meals. 90 tablet 0  . methylphenidate (RITALIN) 20 MG tablet Take 1.5 tablets (30 mg total) by mouth 2 (two) times daily with breakfast and lunch. 90 tablet 0  . [START ON 05/11/2020] methylphenidate (RITALIN) 20 MG tablet Take 1.5 tablets (30 mg total) by mouth 2 (two) times daily with breakfast and lunch. 90 tablet 0  . [START ON 06/10/2020] methylphenidate (RITALIN) 20 MG tablet Take 1.5 tablets (30 mg total) by mouth 2 (two) times daily with breakfast and lunch. 90 tablet 0  . metoprolol succinate (TOPROL-XL) 25  MG 24 hr tablet Take 1 tablet (25 mg total) by mouth daily. 90 tablet 1  . ondansetron (ZOFRAN) 4 MG tablet Take 1 tablet (4 mg total) by mouth every 8 (eight) hours as needed for nausea or vomiting. 20 tablet 0  . pantoprazole (PROTONIX) 40 MG tablet Take 1 tablet (40 mg total) by mouth daily. 30 tablet 11  . promethazine (PHENERGAN) 25 MG tablet Take 1 tablet (25 mg total) by mouth as needed for nausea or vomiting (during the day). 45 tablet 1  . sucralfate (CARAFATE) 1 g tablet Take 1 tablet (1 g total) by mouth 4 (four) times daily. 120 tablet 1  . Vitamin D, Ergocalciferol, (DRISDOL) 1.25 MG (50000 UNIT) CAPS capsule Take 1 capsule (50,000 Units total) by mouth every 7 (seven) days. 4 capsule 4  . zolpidem (AMBIEN CR) 12.5 MG CR tablet Take 1 tablet (12.5 mg total) by mouth at bedtime as needed for sleep. 30 tablet 1   No current facility-administered medications for this visit.     Psychiatric Specialty Exam: Review of Systems  Psychiatric/Behavioral: Positive for sleep disturbance. The patient is nervous/anxious.   All other systems reviewed and are negative.   There were no vitals taken for this visit.There is no height or weight on file to calculate BMI.  General Appearance: Casual and Fairly Groomed  Eye Contact:  Good  Speech:  Clear and Coherent and Normal Rate  Volume:  Normal  Mood:  Anxious and Depressed  Affect:  Constricted  Thought Process:  Goal Directed  Orientation:  Full (Time, Place, and Person)  Thought Content: Rumination   Suicidal Thoughts:  No  Homicidal Thoughts:  No  Memory:  Immediate;   Good Recent;   Good Remote;   Good  Judgement:  Good  Insight:  Fair  Psychomotor Activity:  Normal  Concentration:  Concentration: Fair  Recall:  Good  Fund of Knowledge: Good  Language: Good  Akathisia:  Negative  Handed:  Right  AIMS (if indicated): not done  Assets:  Communication Skills Desire for Improvement Financial Resources/Insurance Housing   ADL's:  Intact  Cognition: WNL  Sleep:  Fair   Screenings: PHQ2-9     Office Visit from 11/14/2019 in [facility] Visit from 01/31/2016 in [place] at [facility]  PHQ-2 Total Score 6 0  PHQ-9 Total Score 24 --       Assessment and Plan: 29 yo MAAF who has been referred to [place] by her PCP for treatment of depression/anxiety and problems with concentration, task completion, forgetfulness consistent with inattentive form of ADHD. Camdyn has never been under care of psychiatry - Dr. Charlett Blake has started her on a combination of sertraline and buspirone in January thus year to help with depression and anxiety. 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 She has been feeling overwhelmed, worrying, depressed, distractible, having racing thoughts, difficulty falling asleep and struggling with sustaining attention, procrastination, task completion, forgetfulness.  She also has panic attacks which have become more frequent lately - occurring daily. 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 Malisa reports that buspirone has helped with anxiety whereas she does not see clear decrease in depression since starting sertraline (even after we increased it to 200 mg). She was briefly switched to fluoxetine 40 mg but it boosted her appetite (she is obese) so it was stopped. She has also tried citalopram w/o benefit prior to sertraline. In March she was started on methylphenidate 10  g bid for adult ASHD with some benefit. Wwe increased dose to 200 mg and it seems to work better. Ambien CR 6.25 mg is minimally helpful for middle insomnia.   Dx: Mixed anxiety (GAD, panic disorder); Depressive disorder unspecified; Adult ADHD  Plan: We will  continue buspirone 15 mg tid, decrease sertraline to 100 mg x 1 week then stop. Start duloxetine 30 mg for one week then increase to 60 mg daily. We will increase methylphenidate to 30 mg bid and increase zolpidem CR to 12.5 mg at HS prn insomnia and continue lorazepam 1 mg bid prn panic attacks. Next visit in 5 weeks. The plan was discussed with patient who had an opportunity to ask questions and these were all answered. I spend 20 minutes in videoconferencing  with the patient     Stephanie Acre, MD 04/11/2020, 1:27 PM

## 2020-04-20 ENCOUNTER — Ambulatory Visit: Payer: 59 | Admitting: Psychologist

## 2020-04-24 ENCOUNTER — Other Ambulatory Visit: Payer: Self-pay | Admitting: Family Medicine

## 2020-04-26 MED ORDER — ONDANSETRON HCL 4 MG PO TABS: 4.0000 mg | ORAL_TABLET | Freq: Three times a day (TID) | ORAL | 0 refills | Status: DC | PRN

## 2020-05-01 ENCOUNTER — Other Ambulatory Visit: Payer: Self-pay

## 2020-05-01 ENCOUNTER — Other Ambulatory Visit: Payer: 59

## 2020-05-01 DIAGNOSIS — Z20822 Contact with and (suspected) exposure to covid-19: Secondary | ICD-10-CM

## 2020-05-02 LAB — NOVEL CORONAVIRUS, NAA: SARS-CoV-2, NAA: NOT DETECTED

## 2020-05-17 ENCOUNTER — Other Ambulatory Visit (HOSPITAL_COMMUNITY): Payer: Self-pay | Admitting: Psychiatry

## 2020-05-17 ENCOUNTER — Other Ambulatory Visit: Payer: Self-pay

## 2020-05-17 ENCOUNTER — Telehealth (INDEPENDENT_AMBULATORY_CARE_PROVIDER_SITE_OTHER): Payer: 59 | Admitting: Psychiatry

## 2020-05-17 DIAGNOSIS — F909 Attention-deficit hyperactivity disorder, unspecified type: Secondary | ICD-10-CM | POA: Diagnosis not present

## 2020-05-17 DIAGNOSIS — F410 Panic disorder [episodic paroxysmal anxiety] without agoraphobia: Secondary | ICD-10-CM | POA: Diagnosis not present

## 2020-05-17 DIAGNOSIS — F411 Generalized anxiety disorder: Secondary | ICD-10-CM | POA: Diagnosis not present

## 2020-05-17 MED ORDER — METHYLPHENIDATE HCL ER (OSM) 54 MG PO TBCR: 54.0000 mg | EXTENDED_RELEASE_TABLET | Freq: Every day | ORAL | 0 refills | Status: DC

## 2020-05-17 MED ORDER — ESCITALOPRAM OXALATE 10 MG PO TABS: ORAL_TABLET | ORAL | 0 refills | Status: DC

## 2020-05-17 MED ORDER — ONDANSETRON HCL 4 MG PO TABS: 4.0000 mg | ORAL_TABLET | Freq: Three times a day (TID) | ORAL | 0 refills | Status: DC | PRN

## 2020-05-17 MED ORDER — DULOXETINE HCL 60 MG PO CPEP: 60.0000 mg | ORAL_CAPSULE | Freq: Every day | ORAL | 0 refills | Status: DC

## 2020-05-17 MED ORDER — LORAZEPAM 1 MG PO TABS
1.0000 mg | ORAL_TABLET | Freq: Three times a day (TID) | ORAL | 1 refills | Status: DC | PRN
Start: 2020-05-17 — End: 2020-06-14

## 2020-05-17 NOTE — Progress Notes (Signed)
BH MD/PA/NP OP Progress Note  05/17/2020 11:03 AM Melissa [name]  MRN:  [id number] Interview was conducted using videoconferencing application and I verified that I was speaking with the correct person using two identifiers. I discussed the limitations of evaluation and management by telemedicine and  the availability of in person appointments. Patient expressed understanding and agreed to proceed. Patient location - home; physician - home office.  Chief Complaint: Nausea, anxiety, insomnia.  HPI: 29 yo MAAF who has been referred to [place] by her PCP for treatment of depression/anxiety and problems with concentration, task completion, forgetfulness consistent with inattentive form of ADHD. Melissa [name] has never been under care of psychiatry - Dr. Charlett Blake has started her on a combination of sertraline and buspirone in January thus year to help with depression and anxiety. 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 She has been feeling overwhelmed, worrying, depressed, distractible, having racing thoughts, difficulty falling asleep and struggling with sustaining attention, procrastination, task completion, forgetfulness. She also has panic attacks which have become more frequent lately - occurring daily. 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 Melissa [name] reports that buspirone has helped with anxiety whereas she does not see clear decrease in depression since starting sertraline (even after we increased it to 200 mg). She was briefly switched to fluoxetine 40 mg but it boosted her appetite (she is obese) so it was stopped. She has also tried citalopram briefly prior to sertraline. In March she was started on methylphenidate 20 mg bid for adult ADHD with some benefit. We increased dose to 30 mg and it seems to work  better but she is complaining of nausea, forgetting to take it in PM.  Ambien CR 12.5 mg is minimally helpful for middle insomnia. She actually takes two 1 mg tabs of lorazepam for insomnia. We have stopped sertraline and started duloxetine but she reports having marked nausea ever since dose was increased to 60 mg.   Visit Diagnosis:    ICD-10-CM   1. Panic disorder  F41.0   2. GAD (generalized anxiety disorder)  F41.1   3. Adult ADHD  F90.9     Past Psychiatric History: Please see intake H&P.  Past Medical History:  Past Medical History:  Diagnosis Date  . Abnormal Pap smear of cervix 07/11/2011  . Acid reflux   . Anxiety   . Asthma    activity induced  . Irregular menses   . Polycystic ovarian syndrome     Past Surgical History:  Procedure Laterality Date  . NO PAST SURGERIES      Family Psychiatric History: Reviewed.  Family History:  Family History  Problem Relation Age of Onset  . Gallbladder disease Mother   . Colon polyps Mother   . Hypertension Father   . Other Father        Hepatitis A  . Diabetes Father   . Drug abuse Father   . Post-traumatic stress disorder Father   . Stomach cancer Maternal Grandmother   . Diabetes Maternal Grandmother   . Colon polyps Maternal Grandmother   . Colon cancer Maternal Grandmother   . Cancer Maternal Grandmother        stomach cancer  . Alcohol abuse Maternal Grandmother   . Emphysema Maternal Grandfather   . Cancer Maternal Grandfather  smoke, lung cancer  . Alcohol abuse Maternal Grandfather   . Heart disease Paternal Grandfather   . Alcohol abuse Paternal Grandfather   . Breast cancer Other        mat great aunts x 2  . Colon cancer Other        mat great uncles x 2  . Irritable bowel syndrome Sister   . Multiple sclerosis Sister   . Bipolar disorder Sister   . Colon polyps Paternal Uncle   . Alcohol abuse Paternal Grandmother   . Pancreatic cancer Neg Hx   . Esophageal cancer Neg Hx     Social  History:  Social History   Socioeconomic History  . Marital status: Married    Spouse name: Not on file  . Number of children: 0  . Years of education: Not on file  . Highest education level: Not on file  Occupational History  . Occupation: Lexicographer: [employer]   Tobacco Use  . Smoking status: Never Smoker  . Smokeless tobacco: Never Used  Vaping Use  . Vaping Use: Never used  Substance and Sexual Activity  . Alcohol use: Not Currently    Alcohol/week: 4.0 - 7.0 standard drinks    Types: 4 - 7 Cans of beer per week    Comment: 1 beer every other ay  . Drug use: Not Currently    Types: Marijuana  . Sexual activity: Yes    Partners: Male    Birth control/protection: None  Other Topics Concern  . Not on file  Social History Narrative   Caffeine use:  1 can soda daily   Regular exercise:  Just started   2 sisters.      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    Social Determinants of Health   Financial Resource Strain:   . Difficulty of Paying Living Expenses: Not on file  Food Insecurity:   . Worried About Charity fundraiser in the Last Year: Not on file  . Ran Out of Food in the Last Year: Not on file  Transportation Needs:   . Lack of Transportation (Medical): Not on file  . Lack of Transportation (Non-Medical): Not on file  Physical Activity:   . Days of Exercise per Week: Not on file  . Minutes of Exercise per Session: Not on file  Stress:   . Feeling of Stress : Not on file  Social Connections:   . Frequency of Communication with Friends and Family: Not on file  . Frequency of Social Gatherings with Friends and Family: Not on file  . Attends Religious Services: Not on file  . Active Member of Clubs or Organizations: Not on file  . Attends Archivist Meetings: Not on file  . Marital  Status: Not on file    Allergies:  Allergies  Allergen Reactions  . Benadryl [Diphenhydramine Hcl] Other (See Comments)    Heart races    Metabolic Disorder Labs: Lab Results  Component Value Date   HGBA1C 6.0 11/15/2019   No results found for: PROLACTIN Lab Results  Component Value Date   CHOL 208 (H) 11/14/2019   TRIG 100.0 11/14/2019   HDL 48.80 11/14/2019   CHOLHDL 4 11/14/2019   VLDL 20.0 11/14/2019   LDLCALC 139 (H) 11/14/2019  LDLCALC 116 (H) 02/09/2017   Lab Results  Component Value Date   TSH 1.39 11/14/2019   TSH 1.78 02/09/2017    Therapeutic Level Labs: No results found for: LITHIUM No results found for: VALPROATE No components found for:  CBMZ  Current Medications: Current Outpatient Medications  Medication Sig Dispense Refill  . albuterol (VENTOLIN HFA) 108 (90 Base) MCG/ACT inhaler Inhale 1-2 puffs into the lungs every 6 (six) hours as needed for wheezing or shortness of breath. 18 g 2  . Azelastine HCl 0.15 % SOLN Place 2 sprays into the nose 2 (two) times daily for 7 days. 2.8 mL 0  . busPIRone (BUSPAR) 15 MG tablet Take 1 tablet (15 mg total) by mouth 3 (three) times daily. 270 tablet 1  . escitalopram (LEXAPRO) 10 MG tablet Take 0.5 tablets (5 mg total) by mouth daily for 8 days, THEN 1 tablet (10 mg total) daily. 34 tablet 0  . famotidine (PEPCID) 40 MG tablet Take 1 tablet (40 mg total) by mouth at bedtime. 30 tablet 3  . hydrOXYzine (ATARAX/VISTARIL) 25 MG tablet Take 1 tablet (25 mg total) by mouth at bedtime as needed for anxiety. 90 tablet 1  . LORazepam (ATIVAN) 1 MG tablet Take 1 tablet (1 mg total) by mouth 3 (three) times daily as needed for anxiety. 90 tablet 1  . methylphenidate (CONCERTA) 54 MG PO CR tablet Take 1 tablet (54 mg total) by mouth daily before breakfast. 30 tablet 0  . [START ON 06/16/2020] methylphenidate (CONCERTA) 54 MG PO CR tablet Take 1 tablet (54 mg total) by mouth daily before breakfast. 30 tablet 0  . [START ON  07/16/2020] methylphenidate (CONCERTA) 54 MG PO CR tablet Take 1 tablet (54 mg total) by mouth daily before breakfast. 30 tablet 0  . metoprolol succinate (TOPROL-XL) 25 MG 24 hr tablet Take 1 tablet (25 mg total) by mouth daily. 90 tablet 1  . ondansetron (ZOFRAN) 4 MG tablet Take 1 tablet (4 mg total) by mouth every 8 (eight) hours as needed for nausea or vomiting. 30 tablet 0  . pantoprazole (PROTONIX) 40 MG tablet Take 1 tablet (40 mg total) by mouth daily. 30 tablet 11  . promethazine (PHENERGAN) 25 MG tablet Take 1 tablet (25 mg total) by mouth as needed for nausea or vomiting (during the day). 45 tablet 1  . sucralfate (CARAFATE) 1 g tablet Take 1 tablet (1 g total) by mouth 4 (four) times daily. 120 tablet 1  . Vitamin D, Ergocalciferol, (DRISDOL) 1.25 MG (50000 UNIT) CAPS capsule Take 1 capsule (50,000 Units total) by mouth every 7 (seven) days. 4 capsule 4   No current facility-administered medications for this visit.     Psychiatric Specialty Exam: Review of Systems  Gastrointestinal: Positive for nausea.  Psychiatric/Behavioral: Positive for decreased concentration and sleep disturbance. The patient is nervous/anxious.   All other systems reviewed and are negative.   There were no vitals taken for this visit.There is no height or weight on file to calculate BMI.  General Appearance: Casual and Fairly Groomed  Eye Contact:  Good  Speech:  Clear and Coherent  Volume:  Normal  Mood:  Anxious  Affect:  Full Range  Thought Process:  Goal Directed  Orientation:  Full (Time, Place, and Person)  Thought Content: Logical   Suicidal Thoughts:  No  Homicidal Thoughts:  No  Memory:  Immediate;   Fair Recent;   Fair Remote;   Good  Judgement:  Good  Insight:  Good  Psychomotor Activity:  Normal  Concentration:  Concentration: Fair  Recall:  [place] of Knowledge: Good  Language: Good  Akathisia:  Negative  Handed:  Right  AIMS (if indicated): not done  Assets:   Communication Skills Desire for Improvement Financial Resources/Insurance Housing Talents/Skills  ADL's:  Intact  Cognition: WNL  Sleep:  Fair   Screenings: PHQ2-9     Office Visit from 11/14/2019 in [facility] Visit from 01/31/2016 in [place] at [facility]  PHQ-2 Total Score 6 0  PHQ-9 Total Score 24 --       Assessment and Plan:  29 yo MAAF who has been referred to [place] by her PCP for treatment of depression/anxiety and problems with concentration, task completion, forgetfulness consistent with inattentive form of ADHD. Lacrystal has never been under care of psychiatry - Dr. Charlett Blake has started her on a combination of sertraline and buspirone in January thus year to help with depression and anxiety. 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 She has been feeling overwhelmed, worrying, depressed, distractible, having racing thoughts, difficulty falling asleep and struggling with sustaining attention, procrastination, task completion, forgetfulness. She also has panic attacks which have become more frequent lately - occurring daily. 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 Anella reports that buspirone has helped with anxiety whereas she does not see clear decrease in depression since starting sertraline (even after we increased it to 200 mg). She was briefly switched to fluoxetine 40 mg but it boosted her appetite (she is obese) so it was stopped. She has also tried citalopram briefly prior to sertraline. In March she was started on methylphenidate 20 mg bid for adult ADHD with some benefit. We increased dose to 30 mg and it seems to work better but she is complaining of nausea, forgetting to take it in PM.  Ambien CR  12.5 mg is minimally helpful for middle insomnia. She actually takes two 1 mg tabs of lorazepam for insomnia. We have stopped sertraline and started duloxetine but she reports having marked nausea ever since dose was increased to 60 mg. Dr. Charlett Blake prescribed her Zofran for it.  Dx: Mixed anxiety (GAD, panic disorder);  Adult ADHD  Plan: We will continue buspirone 15 mg tid, tap[er off duloxetine and start escitalopram 5 mg for 8 days then increased to 10 mg daily. Ambien will be dc and lorazepam continued 1 mg tid prn anxiety/ insomnia.  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 She will continue Zofran prn nausea for the time being.  Next visit in 4 weeks.The plan was discussed with patient who had an opportunity to ask questions and these were all answered. I spend76minutes in videoconferencingwith the patient.    Stephanie Acre, MD 05/17/2020, 11:03 AM

## 2020-05-18 ENCOUNTER — Other Ambulatory Visit (HOSPITAL_COMMUNITY): Payer: Self-pay | Admitting: Psychiatry

## 2020-05-18 ENCOUNTER — Telehealth (HOSPITAL_COMMUNITY): Payer: Self-pay | Admitting: *Deleted

## 2020-05-18 MED ORDER — METHYLPHENIDATE HCL 20 MG PO TABS: 20.0000 mg | ORAL_TABLET | Freq: Three times a day (TID) | ORAL | 0 refills | Status: DC

## 2020-05-18 NOTE — Telephone Encounter (Signed)
Pt called c/o that the Concerta is "doing nothing" and would like to go back to taking the Ritalin 20mg  tid. Pt sounds very anxious, describes continued decrease in focus and is worried about school. Pt next appointment is on 06/14/20. Please review.

## 2020-05-18 NOTE — Telephone Encounter (Signed)
Ok

## 2020-05-18 NOTE — Telephone Encounter (Signed)
She was complaining that she forgets to take it hence change to Concerta but if she prefers to go back to Ritalin I have no objection. She should take Concerta to pharmacy though because thay may have objection giving her refill on another stimulant so soon.

## 2020-05-20 ENCOUNTER — Telehealth: Payer: 59 | Admitting: Physician Assistant

## 2020-05-20 DIAGNOSIS — K047 Periapical abscess without sinus: Secondary | ICD-10-CM

## 2020-05-20 DIAGNOSIS — J019 Acute sinusitis, unspecified: Secondary | ICD-10-CM

## 2020-05-20 MED ORDER — AMOXICILLIN-POT CLAVULANATE 875-125 MG PO TABS: 1.0000 | ORAL_TABLET | Freq: Two times a day (BID) | ORAL | 0 refills | Status: DC

## 2020-05-20 MED ORDER — ONDANSETRON 4 MG PO TBDP: 4.0000 mg | ORAL_TABLET | Freq: Three times a day (TID) | ORAL | 0 refills | Status: DC | PRN

## 2020-05-20 NOTE — Progress Notes (Signed)
We are sorry that you are not feeling well.  Here is how we plan to help!  Based on what you have shared with me it looks like you have sinusitis.  Sinusitis is inflammation and infection in the sinus cavities of the head.  Based on your presentation I believe you most likely have Acute Bacterial Sinusitis.  This is an infection caused by bacteria and is treated with antibiotics. I have prescribed Augmentin 875mg /125mg  one tablet twice daily with food, for 7 days.   I have also sent in dissolvable nausea medication.  You may use an oral decongestant such as Mucinex D or if you have glaucoma or high blood pressure use plain Mucinex. Saline nasal spray help and can safely be used as often as needed for congestion.  If you develop worsening sinus pain, fever or notice severe headache and vision changes, or if symptoms are not better after completion of antibiotic, please schedule an appointment with a health care provider.    Sinus infections are not as easily transmitted as other respiratory infection, however we still recommend that you avoid close contact with loved ones, especially the very young and elderly.  Remember to wash your hands thoroughly throughout the day as this is the number one way to prevent the spread of infection!  Home Care:  Only take medications as instructed by your medical team.  Complete the entire course of an antibiotic.  Do not take these medications with alcohol.  A steam or ultrasonic humidifier can help congestion.  You can place a towel over your head and breathe in the steam from hot water coming from a faucet.  Avoid close contacts especially the very young and the elderly.  Cover your mouth when you cough or sneeze.  Always remember to wash your hands.  Get Help Right Away If:  You develop worsening fever or sinus pain.  You develop a severe head ache or visual changes.  Your symptoms persist after you have completed your treatment plan.  Make sure  you  Understand these instructions.  Will watch your condition.  Will get help right away if you are not doing well or get worse.  Greater than 5 minutes, yet less than 10 minutes of time have been spent researching, coordinating, and implementing care for this patient today.  Inda Coke PA-C

## 2020-05-21 ENCOUNTER — Telehealth: Payer: 59 | Admitting: Family

## 2020-05-21 DIAGNOSIS — R0982 Postnasal drip: Secondary | ICD-10-CM

## 2020-05-21 DIAGNOSIS — R112 Nausea with vomiting, unspecified: Secondary | ICD-10-CM | POA: Diagnosis not present

## 2020-05-21 DIAGNOSIS — R059 Cough, unspecified: Secondary | ICD-10-CM

## 2020-05-21 DIAGNOSIS — R05 Cough: Secondary | ICD-10-CM | POA: Diagnosis not present

## 2020-05-21 MED ORDER — BENZONATATE 100 MG PO CAPS: 100.0000 mg | ORAL_CAPSULE | Freq: Three times a day (TID) | ORAL | 0 refills | Status: DC | PRN

## 2020-05-21 MED ORDER — PROMETHAZINE-DM 6.25-15 MG/5ML PO SYRP: 5.0000 mL | ORAL_SOLUTION | Freq: Three times a day (TID) | ORAL | 0 refills | Status: DC | PRN

## 2020-05-21 NOTE — Progress Notes (Signed)
We are sorry that you are not feeling well.  Here is how we plan to help!   Based on your presentation I believe you most likely have A cough and vomiting from your post nasal drip.     In addition you may use A non-prescription cough medication called Robitussin DAC. Take 2 teaspoons every 8 hours or Delsym: take 2 teaspoons every 12 hours., A non-prescription cough medication called Mucinex DM: take 2 tablets every 12 hours. and A prescription cough medication called Tessalon Perles 100mg . You may take 1-2 capsules every 8 hours as needed for your cough. I have also sent in a cough syrup you can use three times a day.    From your responses in the eVisit questionnaire you describe inflammation in the upper respiratory tract which is causing a significant cough.  This is commonly called Bronchitis and has four common causes:    Allergies  Viral Infections  Acid Reflux  Bacterial Infection Allergies, viruses and acid reflux are treated by controlling symptoms or eliminating the cause. An example might be a cough caused by taking certain blood pressure medications. You stop the cough by changing the medication. Another example might be a cough caused by acid reflux. Controlling the reflux helps control the cough.  USE OF BRONCHODILATOR ("RESCUE") INHALERS: There is a risk from using your bronchodilator too frequently.  The risk is that over-reliance on a medication which only relaxes the muscles surrounding the breathing tubes can reduce the effectiveness of medications prescribed to reduce swelling and congestion of the tubes themselves.  Although you feel brief relief from the bronchodilator inhaler, your asthma may actually be worsening with the tubes becoming more swollen and filled with mucus.  This can delay other crucial treatments, such as oral steroid medications. If you need to use a bronchodilator inhaler daily, several times per day, you should discuss this with your provider.  There  are probably better treatments that could be used to keep your asthma under control.     HOME CARE  Only take medications as instructed by your medical team.  Complete the entire course of an antibiotic.  Drink plenty of fluids and get plenty of rest.  Avoid close contacts especially the very young and the elderly  Cover your mouth if you cough or cough into your sleeve.  Always remember to wash your hands  A steam or ultrasonic humidifier can help congestion.   GET HELP RIGHT AWAY IF:  You develop worsening fever.  You become short of breath  You cough up blood.  Your symptoms persist after you have completed your treatment plan MAKE SURE YOU   Understand these instructions.  Will watch your condition.  Will get help right away if you are not doing well or get worse.  Your e-visit answers were reviewed by a board certified advanced clinical practitioner to complete your personal care plan.  Depending on the condition, your plan could have included both over the counter or prescription medications. If there is a problem please reply  once you have received a response from your provider. Your safety is important to [place].  If you have drug allergies check your prescription carefully.    You can use MyChart to ask questions about todays visit, request a non-urgent call back, or ask for a work or school excuse for 24 hours related to this e-Visit. If it has been greater than 24 hours you will need to follow up with your provider, or enter a  new e-Visit to address those concerns. You will get an e-mail in the next two days asking about your experience.  I hope that your e-visit has been valuable and will speed your recovery. Thank you for using e-visits.   Approximately 5 minutes was spent documenting and reviewing patient's chart.

## 2020-05-22 ENCOUNTER — Ambulatory Visit: Payer: 59 | Admitting: Physician Assistant

## 2020-05-22 ENCOUNTER — Ambulatory Visit: Admit: 2020-05-22 | Discharge status: Against Medical Advice | Payer: 59

## 2020-05-22 VITALS — BP 109/80 | HR 95 | Temp 97.8°F | Resp 18

## 2020-05-22 DIAGNOSIS — Z20822 Contact with and (suspected) exposure to covid-19: Secondary | ICD-10-CM | POA: Diagnosis not present

## 2020-05-22 DIAGNOSIS — N979 Female infertility, unspecified: Secondary | ICD-10-CM | POA: Insufficient documentation

## 2020-05-22 LAB — POCT INFLUENZA A/B
Influenza A, POC: NEGATIVE
Influenza B, POC: NEGATIVE

## 2020-05-22 LAB — POC COVID19 BINAXNOW: SARS Coronavirus 2 Ag: NEGATIVE

## 2020-05-22 NOTE — Patient Instructions (Addendum)
Your rapid Covid test was negative, your flu test was negative.  We will call you with the results of your PCR Covid testing.   Continue antibiotic as prescribed by your PCP, I encourage you to continue the zofran, use a probiotic or eat yogurt to help resolve the diarrhea.  If the diarrhea becomes intolerable, please feel free to return to the [facility] or your PCP.  It is okay for you to take the medication your PCP prescribed you for your cough and congestion.  I also recommend using Tylenol to help you with your body aches and sinus pressure.  Make sure to get lots of rest and fluids.  I hope that you feel better soon  Kennieth Rad, PA-C Physician Assistant [facility] [email or web address]    Sinusitis, Adult Sinusitis is inflammation of your sinuses. Sinuses are hollow spaces in the bones around your face. Your sinuses are located:  Around your eyes.  In the middle of your forehead.  Behind your nose.  In your cheekbones. Mucus normally drains out of your sinuses. When your nasal tissues become inflamed or swollen, mucus can become trapped or blocked. This allows bacteria, viruses, and fungi to grow, which leads to infection. Most infections of the sinuses are caused by a virus. Sinusitis can develop quickly. It can last for up to 4 weeks (acute) or for more than 12 weeks (chronic). Sinusitis often develops after a cold. What are the causes? This condition is caused by anything that creates swelling in the sinuses or stops mucus from draining. This includes:  Allergies.  Asthma.  Infection from bacteria or viruses.  Deformities or blockages in your nose or sinuses.  Abnormal growths in the nose (nasal polyps).  Pollutants, such as chemicals or irritants in the air.  Infection from fungi (rare). What increases the risk? You are more likely to develop this condition if you:  Have a weak  body defense system (immune system).  Do a lot of swimming or diving.  Overuse nasal sprays.  Smoke. What are the signs or symptoms? The main symptoms of this condition are pain and a feeling of pressure around the affected sinuses. Other symptoms include:  Stuffy nose or congestion.  Thick drainage from your nose.  Swelling and warmth over the affected sinuses.  Headache.  Upper toothache.  A cough that may get worse at night.  Extra mucus that collects in the throat or the back of the nose (postnasal drip).  Decreased sense of smell and taste.  Fatigue.  A fever.  Sore throat.  Bad breath. How is this diagnosed? This condition is diagnosed based on:  Your symptoms.  Your medical history.  A physical exam.  Tests to find out if your condition is acute or chronic. This may include: ? Checking your nose for nasal polyps. ? Viewing your sinuses using a device that has a light (endoscope). ? Testing for allergies or bacteria. ? Imaging tests, such as an MRI or CT scan. In rare cases, a bone biopsy may be done to rule out more serious types of fungal sinus disease. How is this treated? Treatment for sinusitis depends on the cause and whether your condition is chronic or acute.  If caused by a virus, your symptoms should go away on their own within 10 days. You may be given medicines to relieve symptoms. They include: ? Medicines that shrink swollen nasal passages (topical intranasal decongestants). ? Medicines that treat allergies (antihistamines). ? A  spray that eases inflammation of the nostrils (topical intranasal corticosteroids). ? Rinses that help get rid of thick mucus in your nose (nasal saline washes).  If caused by bacteria, your health care provider may recommend waiting to see if your symptoms improve. Most bacterial infections will get better without antibiotic medicine. You may be given antibiotics if you have: ? A severe infection. ? A weak immune  system.  If caused by narrow nasal passages or nasal polyps, you may need to have surgery. Follow these instructions at home: Medicines  Take, use, or apply over-the-counter and prescription medicines only as told by your health care provider. These may include nasal sprays.  If you were prescribed an antibiotic medicine, take it as told by your health care provider. Do not stop taking the antibiotic even if you start to feel better. Hydrate and humidify   Drink enough fluid to keep your urine pale yellow. Staying hydrated will help to thin your mucus.  Use a cool mist humidifier to keep the humidity level in your home above 50%.  Inhale steam for 10-15 minutes, 3-4 times a day, or as told by your health care provider. You can do this in the bathroom while a hot shower is running.  Limit your exposure to cool or dry air. Rest  Rest as much as possible.  Sleep with your head raised (elevated).  Make sure you get enough sleep each night. General instructions   Apply a warm, moist washcloth to your face 3-4 times a day or as told by your health care provider. This will help with discomfort.  Wash your hands often with soap and water to reduce your exposure to germs. If soap and water are not available, use hand sanitizer.  Do not smoke. Avoid being around people who are smoking (secondhand smoke).  Keep all follow-up visits as told by your health care provider. This is important. Contact a health care provider if:  You have a fever.  Your symptoms get worse.  Your symptoms do not improve within 10 days. Get help right away if:  You have a severe headache.  You have persistent vomiting.  You have severe pain or swelling around your face or eyes.  You have vision problems.  You develop confusion.  Your neck is stiff.  You have trouble breathing. Summary  Sinusitis is soreness and inflammation of your sinuses. Sinuses are hollow spaces in the bones around your  face.  This condition is caused by nasal tissues that become inflamed or swollen. The swelling traps or blocks the flow of mucus. This allows bacteria, viruses, and fungi to grow, which leads to infection.  If you were prescribed an antibiotic medicine, take it as told by your health care provider. Do not stop taking the antibiotic even if you start to feel better.  Keep all follow-up visits as told by your health care provider. This is important. This information is not intended to replace advice given to you by your health care provider. Make sure you discuss any questions you have with your health care provider. Document Revised: 01/18/2018 Document Reviewed: 01/18/2018 Elsevier Patient Education  [place].

## 2020-05-22 NOTE — Progress Notes (Signed)
New Patient Office Visit  Subjective:  Patient ID: Melissa [name], female    DOB: 1990/09/14  Age: 29 y.o. MRN: [id number]  CC:  Chief Complaint  Patient presents with  . URI    HPI Melissa [name] reports that she started feeling poorly on 17 September, states that she had a virtual visit with her primary care provider 19 September who diagnosed her with sinusitis and prescribed Augmentin and Zofran and was also encouraged to use Mucinex and over-the-counter pain relievers.  States that she has been having sinus pain and pressure, post nasal drip, congestion with yellow discharge, fevers of 101; a sore throat and a non-productive cough.  States that the PND is causing her to have nausea, making it hard to eat.  Is able to drink fluids. States that she has fatigue as well. States that the Zofran is not offering much relief from the nausea  States her kids have been sick, but not with similar sxs, that she is fully vaccinated against Covid-19  Reports that she is on day 2 of taking Augmentin, states that she has started having some episodes of diarrhea. States that she believes that antibiotics are begging to offer a little relief.        Past Medical History:  Diagnosis Date  . Abnormal Pap smear of cervix 07/11/2011  . Acid reflux   . Anxiety   . Asthma    activity induced  . Irregular menses   . Polycystic ovarian syndrome     Past Surgical History:  Procedure Laterality Date  . NO PAST SURGERIES      Family History  Problem Relation Age of Onset  . Gallbladder disease Mother   . Colon polyps Mother   . Hypertension Father   . Other Father        Hepatitis A  . Diabetes Father   . Drug abuse Father   . Post-traumatic stress disorder Father   . Stomach cancer Maternal Grandmother   . Diabetes Maternal Grandmother   . Colon polyps Maternal Grandmother   . Colon cancer Maternal Grandmother   . Cancer Maternal Grandmother        stomach cancer  . Alcohol abuse  Maternal Grandmother   . Emphysema Maternal Grandfather   . Cancer Maternal Grandfather        smoke, lung cancer  . Alcohol abuse Maternal Grandfather   . Heart disease Paternal Grandfather   . Alcohol abuse Paternal Grandfather   . Breast cancer Other        mat great aunts x 2  . Colon cancer Other        mat great uncles x 2  . Irritable bowel syndrome Sister   . Multiple sclerosis Sister   . Bipolar disorder Sister   . Colon polyps Paternal Uncle   . Alcohol abuse Paternal Grandmother   . Pancreatic cancer Neg Hx   . Esophageal cancer Neg Hx     Social History   Socioeconomic History  . Marital status: Married    Spouse name: Not on file  . Number of children: 0  . Years of education: Not on file  . Highest education level: Not on file  Occupational History  . Occupation: Lexicographer: [employer]   Tobacco Use  . Smoking status: Never Smoker  . Smokeless tobacco: Never Used  Vaping Use  . Vaping Use: Never used  Substance and Sexual Activity  . Alcohol use: Not Currently  Alcohol/week: 4.0 - 7.0 standard drinks    Types: 4 - 7 Cans of beer per week    Comment: 1 beer every other ay  . Drug use: Not Currently    Types: Marijuana  . Sexual activity: Yes    Partners: Male    Birth control/protection: None  Other Topics Concern  . Not on file  Social History Narrative   Caffeine use:  1 can soda daily   Regular exercise:  Just started   2 sisters.      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    Social Determinants of Health   Financial Resource Strain:   . Difficulty of Paying Living Expenses: Not on file  Food Insecurity:   . Worried About Charity fundraiser in the Last Year: Not on file  . Ran Out of Food in the Last Year: Not on file  Transportation Needs:   . Lack of Transportation  (Medical): Not on file  . Lack of Transportation (Non-Medical): Not on file  Physical Activity:   . Days of Exercise per Week: Not on file  . Minutes of Exercise per Session: Not on file  Stress:   . Feeling of Stress : Not on file  Social Connections:   . Frequency of Communication with Friends and Family: Not on file  . Frequency of Social Gatherings with Friends and Family: Not on file  . Attends Religious Services: Not on file  . Active Member of Clubs or Organizations: Not on file  . Attends Archivist Meetings: Not on file  . Marital Status: Not on file  Intimate Partner Violence:   . Fear of Current or Ex-Partner: Not on file  . Emotionally Abused: Not on file  . Physically Abused: Not on file  . Sexually Abused: Not on file    ROS Review of Systems  Constitutional: Positive for chills, fatigue and fever.  HENT: Positive for congestion, postnasal drip, sinus pressure, sinus pain, sneezing and sore throat.   Eyes: Negative.   Respiratory: Positive for cough. Negative for wheezing.   Cardiovascular: Negative.   Gastrointestinal: Positive for diarrhea, nausea and vomiting.  Endocrine: Negative.   Genitourinary: Negative.   Musculoskeletal: Positive for myalgias.  Skin: Negative.   Allergic/Immunologic: Negative.   Neurological: Positive for headaches. Negative for dizziness.  Hematological: Negative.   Psychiatric/Behavioral: Negative.     Objective:   Today's Vitals: BP 109/80 (BP Location: Left Arm, Patient Position: Sitting, Cuff Size: Large)   Pulse 95   Temp 97.8 F (36.6 C) (Oral)   Resp 18   SpO2 96%   Physical Exam Vitals and nursing note reviewed.  Constitutional:      General: She is not in acute distress.    Appearance: Normal appearance. She is not ill-appearing or diaphoretic.  HENT:     Head: Normocephalic and atraumatic.     Right Ear: Tympanic membrane, ear canal and external ear normal.     Left Ear: Tympanic membrane, ear canal  and external ear normal.     Nose: Rhinorrhea present.     Mouth/Throat:     Mouth: Mucous membranes are moist.     Pharynx: Oropharynx is clear. No oropharyngeal exudate or posterior oropharyngeal erythema.  Eyes:     Extraocular Movements: Extraocular movements intact.  Conjunctiva/sclera: Conjunctivae normal.     Pupils: Pupils are equal, round, and reactive to light.  Cardiovascular:     Rate and Rhythm: Normal rate and regular rhythm.     Pulses: Normal pulses.     Heart sounds: Normal heart sounds.  Pulmonary:     Effort: Pulmonary effort is normal. No respiratory distress.     Breath sounds: Normal breath sounds. No wheezing.  Abdominal:     General: Abdomen is flat. Bowel sounds are normal.     Palpations: Abdomen is soft.  Musculoskeletal:        General: Normal range of motion.     Cervical back: Normal range of motion and neck supple. Tenderness present. No rigidity.  Lymphadenopathy:     Cervical: Cervical adenopathy present.  Skin:    General: Skin is warm and dry.  Neurological:     General: No focal deficit present.     Mental Status: She is alert and oriented to person, place, and time.  Psychiatric:        Mood and Affect: Mood normal.        Behavior: Behavior normal.        Thought Content: Thought content normal.        Judgment: Judgment normal.     Assessment & Plan:   Problem List Items Addressed This Visit    None    Visit Diagnoses    Suspected 2019 novel coronavirus infection    -  Primary   Relevant Orders   Novel Coronavirus, NAA ([facility])   POC COVID-19 (Completed)   Influenza A/B (Completed)      Outpatient Encounter Medications as of 05/22/2020  Medication Sig  . albuterol (VENTOLIN HFA) 108 (90 Base) MCG/ACT inhaler Inhale 1-2 puffs into the lungs every 6 (six) hours as needed for wheezing or shortness of breath.  Marland Kitchen amoxicillin-clavulanate (AUGMENTIN) 875-125 MG tablet Take 1 tablet by mouth 2 (two) times daily.  . Azelastine  HCl 0.15 % SOLN Place 2 sprays into the nose 2 (two) times daily for 7 days.  . benzonatate (TESSALON PERLES) 100 MG capsule Take 1 capsule (100 mg total) by mouth 3 (three) times daily as needed.  . busPIRone (BUSPAR) 15 MG tablet Take 1 tablet (15 mg total) by mouth 3 (three) times daily.  Marland Kitchen escitalopram (LEXAPRO) 10 MG tablet Take 0.5 tablets (5 mg total) by mouth daily for 8 days, THEN 1 tablet (10 mg total) daily.  . famotidine (PEPCID) 40 MG tablet Take 1 tablet (40 mg total) by mouth at bedtime.  . hydrOXYzine (ATARAX/VISTARIL) 25 MG tablet Take 1 tablet (25 mg total) by mouth at bedtime as needed for anxiety.  Marland Kitchen LORazepam (ATIVAN) 1 MG tablet Take 1 tablet (1 mg total) by mouth 3 (three) times daily as needed for anxiety.  . methylphenidate (RITALIN) 20 MG tablet Take 1 tablet (20 mg total) by mouth 3 (three) times daily with meals.  Derrill Memo ON 06/17/2020] methylphenidate (RITALIN) 20 MG tablet Take 1 tablet (20 mg total) by mouth 3 (three) times daily with meals.  Derrill Memo ON 07/17/2020] methylphenidate (RITALIN) 20 MG tablet Take 1 tablet (20 mg total) by mouth 3 (three) times daily with meals.  . metoprolol succinate (TOPROL-XL) 25 MG 24 hr tablet Take 1 tablet (25 mg total) by mouth daily.  . ondansetron (ZOFRAN ODT) 4 MG disintegrating tablet Take 1 tablet (4 mg total) by mouth every 8 (eight) hours as needed for nausea or vomiting.  Marland Kitchen  ondansetron (ZOFRAN) 4 MG tablet Take by mouth.  . pantoprazole (PROTONIX) 40 MG tablet Take 1 tablet (40 mg total) by mouth daily.  . promethazine (PHENERGAN) 25 MG tablet Take 1 tablet (25 mg total) by mouth as needed for nausea or vomiting (during the day).  . promethazine-dextromethorphan (PROMETHAZINE-DM) 6.25-15 MG/5ML syrup Take 5 mLs by mouth 3 (three) times daily as needed for cough.  . sucralfate (CARAFATE) 1 g tablet Take 1 tablet (1 g total) by mouth 4 (four) times daily.  . Vitamin D, Ergocalciferol, (DRISDOL) 1.25 MG (50000 UNIT) CAPS  capsule Take 1 capsule (50,000 Units total) by mouth every 7 (seven) days.   No facility-administered encounter medications on file as of 05/22/2020.  1. Suspected 2019 novel coronavirus infection Rapid covid testing negative; flu testing negative.  Patient encouraged to continue previously prescribed abx tx from PCP; encourage symptomatic treatment OTC; rest; fluids The patient was given clear instructions to go to ER if symptoms don't improve, worsen or new problems develop. The patient verbalized understanding. The patient was told to call to get lab results if they haven't heard anything in the next week.   - Novel Coronavirus, NAA ([facility]) - POC COVID-19 - Influenza A/B   I have reviewed the patient's medical history (PMH, PSH, Social History, Family History, Medications, and allergies) , and have been updated if relevant. I spent 30 minutes reviewing chart and  face to face time with patient.     Follow-up: Return if symptoms worsen or fail to improve.   Loraine Grip Mayers, PA-C

## 2020-05-24 LAB — SARS-COV-2, NAA 2 DAY TAT

## 2020-05-24 LAB — NOVEL CORONAVIRUS, NAA: SARS-CoV-2, NAA: NOT DETECTED

## 2020-05-25 MED ORDER — DOXYCYCLINE HYCLATE 100 MG PO TABS: 100.0000 mg | ORAL_TABLET | Freq: Two times a day (BID) | ORAL | 0 refills | Status: DC

## 2020-05-25 MED ORDER — FLUCONAZOLE 150 MG PO TABS: 150.0000 mg | ORAL_TABLET | Freq: Once | ORAL | 0 refills | Status: AC

## 2020-05-25 NOTE — Addendum Note (Signed)
Addended by: Dutch Quint B on: 05/25/2020 01:50 PM   Modules accepted: Orders

## 2020-05-29 ENCOUNTER — Ambulatory Visit: Admit: 2020-05-29 | Discharge status: Absent without leave | Payer: 59

## 2020-05-29 ENCOUNTER — Encounter: Payer: Self-pay | Admitting: Internal Medicine

## 2020-05-29 ENCOUNTER — Telehealth (INDEPENDENT_AMBULATORY_CARE_PROVIDER_SITE_OTHER): Payer: 59 | Admitting: Internal Medicine

## 2020-05-29 ENCOUNTER — Telehealth: Payer: Self-pay | Admitting: *Deleted

## 2020-05-29 DIAGNOSIS — B373 Candidiasis of vulva and vagina: Secondary | ICD-10-CM

## 2020-05-29 DIAGNOSIS — B3731 Acute candidiasis of vulva and vagina: Secondary | ICD-10-CM | POA: Insufficient documentation

## 2020-05-29 MED ORDER — TERCONAZOLE 0.4 % VA CREA: 1.0000 | TOPICAL_CREAM | Freq: Every day | VAGINAL | 0 refills | Status: DC

## 2020-05-29 MED ORDER — FLUCONAZOLE 150 MG PO TABS: 150.0000 mg | ORAL_TABLET | Freq: Once | ORAL | 0 refills | Status: AC

## 2020-05-29 NOTE — Progress Notes (Signed)
Established Patient Office Visit  Subjective:  Patient ID: Melissa [name], female    DOB: [birth year]  Age: 29 y.o. MRN: [id number]  CC:  Chief Complaint  Patient presents with  . Vaginitis     Virtual Visit via Telephone Note  I connected with Melissa [name] on 05/29/20 at 11:10 AM EDT by telephone and verified that I am speaking with the correct person using two identifiers.  Location: Patient: Home Provider: Working remotely from home    I discussed the limitations, risks, security and privacy concerns of performing an evaluation and management service by telephone and the availability of in person appointments. I also discussed with the patient that there may be a patient responsible charge related to this service. The patient expressed understanding and agreed to proceed.   History of Present Illness: States that she started having vaginal itching on 24 September, states that she has seen a thick cottage cheese like discharge on the tissue.  Describes it with the medicine like smell.  Reports that she took 5 days of Augmentin with the last dose being September 24, states that she took a recently prescribed Diflucan on September 24 without relief, and then took another dose of Diflucan on September 27 without relief.  Does endorse that she is unsure if the second dose was expired, states this was prescribed to her in the past.    Observations/Objective: Medical history and current medications reviewed, no physical exam completed     Past Medical History:  Diagnosis Date  . Abnormal Pap smear of cervix 07/11/2011  . Acid reflux   . Anxiety   . Asthma    activity induced  . Irregular menses   . Polycystic ovarian syndrome     Past Surgical History:  Procedure Laterality Date  . NO PAST SURGERIES      Family History  Problem Relation Age of Onset  . Gallbladder disease Mother   . Colon polyps Mother   . Hypertension Father   . Other Father        Hepatitis A  .  Diabetes Father   . Drug abuse Father   . Post-traumatic stress disorder Father   . Stomach cancer Maternal Grandmother   . Diabetes Maternal Grandmother   . Colon polyps Maternal Grandmother   . Colon cancer Maternal Grandmother   . Cancer Maternal Grandmother        stomach cancer  . Alcohol abuse Maternal Grandmother   . Emphysema Maternal Grandfather   . Cancer Maternal Grandfather        smoke, lung cancer  . Alcohol abuse Maternal Grandfather   . Heart disease Paternal Grandfather   . Alcohol abuse Paternal Grandfather   . Breast cancer Other        mat great aunts x 2  . Colon cancer Other        mat great uncles x 2  . Irritable bowel syndrome Sister   . Multiple sclerosis Sister   . Bipolar disorder Sister   . Colon polyps Paternal Uncle   . Alcohol abuse Paternal Grandmother   . Pancreatic cancer Neg Hx   . Esophageal cancer Neg Hx     Social History   Socioeconomic History  . Marital status: Married    Spouse name: Not on file  . Number of children: 0  . Years of education: Not on file  . Highest education level: Not on file  Occupational History  . Occupation: Lexicographer: [employer]   Tobacco Use  . Smoking status: Never Smoker  . Smokeless tobacco: Never Used  Vaping Use  . Vaping Use: Never used  Substance and Sexual Activity  . Alcohol use: Not Currently    Alcohol/week: 4.0 - 7.0 standard drinks    Types: 4 - 7 Cans of beer per week    Comment: 1 beer every other ay  . Drug use: Not Currently    Types: Marijuana  . Sexual activity: Yes    Partners: Male    Birth control/protection: None  Other Topics Concern  . Not on file  Social History Narrative   Caffeine use:  1 can soda daily   Regular exercise:  Just started   2 sisters.      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     Social Determinants of Health   Financial Resource Strain:   . Difficulty of Paying Living Expenses: Not on file  Food Insecurity:   . Worried About Charity fundraiser in the Last Year: Not on file  . Ran Out of Food in the Last Year: Not on file  Transportation Needs:   . Lack of Transportation (Medical): Not on file  . Lack of Transportation (Non-Medical): Not on file  Physical Activity:   . Days of Exercise per Week: Not on file  . Minutes of Exercise per Session: Not on file  Stress:   . Feeling of Stress : Not on file  Social Connections:   . Frequency of Communication with Friends and Family: Not on file  . Frequency of Social Gatherings with Friends and Family: Not on file  . Attends Religious Services: Not on file  . Active Member of Clubs or Organizations: Not on file  . Attends Archivist Meetings: Not on file  . Marital Status: Not on file  Intimate Partner Violence:   . Fear of Current or Ex-Partner: Not on file  . Emotionally Abused: Not on file  . Physically Abused: Not on file  . Sexually Abused: Not on file    Outpatient Medications Prior to Visit  Medication Sig Dispense Refill  . albuterol (VENTOLIN HFA) 108 (90 Base) MCG/ACT inhaler Inhale 1-2 puffs into the lungs every 6 (six) hours as needed for wheezing or shortness of breath. 18 g 2  . busPIRone (BUSPAR) 15 MG tablet Take 1 tablet (15 mg total) by mouth 3 (three) times daily. 270 tablet 1  . doxycycline (VIBRA-TABS) 100 MG tablet Take 1 tablet (100 mg total) by mouth 2 (two) times daily. 20 tablet 0  . escitalopram (LEXAPRO) 10 MG tablet Take 0.5 tablets (5 mg total) by mouth daily for 8 days, THEN 1 tablet (10 mg total) daily. 34 tablet 0  . famotidine (PEPCID) 40 MG tablet Take 1 tablet (40 mg total) by mouth at bedtime. 30 tablet 3  . LORazepam (ATIVAN) 1 MG tablet Take 1 tablet (1 mg total) by mouth 3 (three) times daily as needed for anxiety. 90 tablet 1  . methylphenidate (RITALIN) 20  MG tablet Take 1 tablet (20 mg total) by mouth 3 (three) times daily with meals. 90 tablet 0  . [START ON 06/17/2020] methylphenidate (RITALIN) 20 MG tablet Take 1 tablet (20 mg total) by mouth 3 (three) times daily with meals. 90 tablet 0  . [  START ON 07/17/2020] methylphenidate (RITALIN) 20 MG tablet Take 1 tablet (20 mg total) by mouth 3 (three) times daily with meals. 90 tablet 0  . ondansetron (ZOFRAN ODT) 4 MG disintegrating tablet Take 1 tablet (4 mg total) by mouth every 8 (eight) hours as needed for nausea or vomiting. 20 tablet 0  . ondansetron (ZOFRAN) 4 MG tablet Take by mouth.    . pantoprazole (PROTONIX) 40 MG tablet Take 1 tablet (40 mg total) by mouth daily. 30 tablet 11  . promethazine (PHENERGAN) 25 MG tablet Take 1 tablet (25 mg total) by mouth as needed for nausea or vomiting (during the day). 45 tablet 1  . promethazine-dextromethorphan (PROMETHAZINE-DM) 6.25-15 MG/5ML syrup Take 5 mLs by mouth 3 (three) times daily as needed for cough. 473 mL 0   No facility-administered medications prior to visit.    Allergies  Allergen Reactions  . Benadryl [Diphenhydramine Hcl] Other (See Comments)    Heart races    ROS Review of Systems  Constitutional: Negative for chills, fatigue and fever.  HENT: Negative.   Eyes: Negative.   Respiratory: Negative.   Cardiovascular: Negative.   Gastrointestinal: Negative for nausea and vomiting.  Genitourinary: Positive for dysuria and vaginal discharge. Negative for flank pain, frequency and pelvic pain.  Musculoskeletal: Negative for back pain.  Skin: Negative.   Allergic/Immunologic: Negative.   Neurological: Negative.   Hematological: Negative.   Psychiatric/Behavioral: Negative.       Objective:   There were no vitals taken for this visit. Wt Readings from Last 3 Encounters:  12/16/19 227 lb (103 kg)  11/14/19 241 lb 12.8 oz (109.7 kg)  10/25/19 238 lb (108 kg)     Health Maintenance Due  Topic Date Due  . Hepatitis C  Screening  Never done  . COVID-19 Vaccine (1) Never done  . INFLUENZA VACCINE  04/01/2020    There are no preventive care reminders to display for this patient.  Lab Results  Component Value Date   TSH 1.39 11/14/2019   Lab Results  Component Value Date   WBC 5.5 11/14/2019   HGB 12.3 11/14/2019   HCT 37.6 11/14/2019   MCV 78.7 11/14/2019   PLT 361.0 11/14/2019   Lab Results  Component Value Date   NA 139 11/14/2019   K 4.1 11/14/2019   CO2 27 11/14/2019   GLUCOSE 89 11/14/2019   BUN 12 11/14/2019   CREATININE 0.54 11/14/2019   BILITOT 0.3 11/14/2019   ALKPHOS 69 11/14/2019   AST 12 11/14/2019   ALT 19 11/14/2019   PROT 7.1 11/14/2019   ALBUMIN 4.0 11/14/2019   CALCIUM 8.8 11/14/2019   ANIONGAP 11 09/27/2019   GFR 159.23 11/14/2019   Lab Results  Component Value Date   CHOL 208 (H) 11/14/2019   Lab Results  Component Value Date   HDL 48.80 11/14/2019   Lab Results  Component Value Date   LDLCALC 139 (H) 11/14/2019   Lab Results  Component Value Date   TRIG 100.0 11/14/2019   Lab Results  Component Value Date   CHOLHDL 4 11/14/2019   Lab Results  Component Value Date   HGBA1C 6.0 11/15/2019      Assessment & Plan:   Problem List Items Addressed This Visit      Genitourinary   Vaginal yeast infection - Primary   Relevant Medications   fluconazole (DIFLUCAN) 150 MG tablet   terconazole (TERAZOL 7) 0.4 % vaginal cream     Assessment and Plan: 1. Vaginal yeast  infection Patient education given, encouraged to follow-up if no improvement - fluconazole (DIFLUCAN) 150 MG tablet; Take 1 tablet (150 mg total) by mouth once for 1 dose.  Dispense: 1 tablet; Refill: 0 - terconazole (TERAZOL 7) 0.4 % vaginal cream; Place 1 applicator vaginally at bedtime.  Dispense: 45 g; Refill: 0   Follow Up Instructions:    I discussed the assessment and treatment plan with the patient. The patient was provided an opportunity to ask questions and all were  answered. The patient agreed with the plan and demonstrated an understanding of the instructions.   The patient was advised to call back or seek an in-person evaluation if the symptoms worsen or if the condition fails to improve as anticipated.  I provided 21 minutes of non-face-to-face time during this encounter.   Adilyn Humes S Mayers, PA-C   Meds ordered this encounter  Medications  . fluconazole (DIFLUCAN) 150 MG tablet    Sig: Take 1 tablet (150 mg total) by mouth once for 1 dose.    Dispense:  1 tablet    Refill:  0    Order Specific Question:   Supervising Provider    Answer:   Asencion Noble E [[id number]]  . terconazole (TERAZOL 7) 0.4 % vaginal cream    Sig: Place 1 applicator vaginally at bedtime.    Dispense:  45 g    Refill:  0    Order Specific Question:   Supervising Provider    Answer:   Elsie Stain [[id number]]    Follow-up: Return if symptoms worsen or fail to improve.    Loraine Grip Mayers, PA-C

## 2020-05-29 NOTE — Patient Instructions (Signed)
I sent the 2 prescriptions to your pharmacy, I encourage you to drink lots of water and get plenty of rest.  I hope that you feel better soon   Kennieth Rad, PA-C Physician Assistant [place] [email or web address]    Vaginal Yeast Infection, Adult  Vaginal yeast infection is a condition that causes vaginal discharge as well as soreness, swelling, and redness (inflammation) of the vagina. This is a common condition. Some women get this infection frequently. What are the causes? This condition is caused by a change in the normal balance of the yeast (candida) and bacteria that live in the vagina. This change causes an overgrowth of yeast, which causes the inflammation. What increases the risk? The condition is more likely to develop in women who:  Take antibiotic medicines.  Have diabetes.  Take birth control pills.  Are pregnant.  Douche often.  Have a weak body defense system (immune system).  Have been taking steroid medicines for a long time.  Frequently wear tight clothing. What are the signs or symptoms? Symptoms of this condition include:  White, thick, creamy vaginal discharge.  Swelling, itching, redness, and irritation of the vagina. The lips of the vagina (vulva) may be affected as well.  Pain or a burning feeling while urinating.  Pain during sex. How is this diagnosed? This condition is diagnosed based on:  Your medical history.  A physical exam.  A pelvic exam. Your health care provider will examine a sample of your vaginal discharge under a microscope. Your health care provider may send this sample for testing to confirm the diagnosis. How is this treated? This condition is treated with medicine. Medicines may be over-the-counter or prescription. You may be told to use one or more of the following:  Medicine that is taken by mouth (orally).  Medicine that is applied as a cream  (topically).  Medicine that is inserted directly into the vagina (suppository). Follow these instructions at home:  Lifestyle  Do not have sex until your health care provider approves. Tell your sex partner that you have a yeast infection. That person should go to his or her health care provider and ask if they should also be treated.  Do not wear tight clothes, such as pantyhose or tight pants.  Wear breathable cotton underwear. General instructions  Take or apply over-the-counter and prescription medicines only as told by your health care provider.  Eat more yogurt. This may help to keep your yeast infection from returning.  Do not use tampons until your health care provider approves.  Try taking a sitz bath to help with discomfort. This is a warm water bath that is taken while you are sitting down. The water should only come up to your hips and should cover your buttocks. Do this 3-4 times per day or as told by your health care provider.  Do not douche.  If you have diabetes, keep your blood sugar levels under control.  Keep all follow-up visits as told by your health care provider. This is important. Contact a health care provider if:  You have a fever.  Your symptoms go away and then return.  Your symptoms do not get better with treatment.  Your symptoms get worse.  You have new symptoms.  You develop blisters in or around your vagina.  You have blood coming from your vagina and it is not your menstrual period.  You develop pain in your abdomen. Summary  Vaginal yeast infection is a condition that causes  discharge as well as soreness, swelling, and redness (inflammation) of the vagina.  This condition is treated with medicine. Medicines may be over-the-counter or prescription.  Take or apply over-the-counter and prescription medicines only as told by your health care provider.  Do not douche. Do not have sex or use tampons until your health care provider  approves.  Contact a health care provider if your symptoms do not get better with treatment or your symptoms go away and then return. This information is not intended to replace advice given to you by your health care provider. Make sure you discuss any questions you have with your health care provider. Document Revised: 03/18/2019 Document Reviewed: 01/04/2018 Elsevier Patient Education  [place].

## 2020-05-29 NOTE — Telephone Encounter (Signed)
Patient verified DOB Patient is aware of PCR being negative. Patient complains of a extreme yeast discomfort and completed an e visit on the 24th. She received doxy and diflucan. Patient completed only diflucan and shared there was no relief after completing the two. Patient refuses to take additional antibiotics until her yeast infection is cleared.

## 2020-05-29 NOTE — Telephone Encounter (Signed)
-----   Message from Kennieth Rad, [place] sent at 05/24/2020  9:12 AM EDT ----- Please inform patient PCR testing for COVID was negative

## 2020-05-29 NOTE — Telephone Encounter (Signed)
Patient is being scheduled a tele visit for her concerns.

## 2020-05-29 NOTE — Progress Notes (Signed)
Patient complains of a extreme yeast discomfort and completed an e visit on the 24th. She received doxy and diflucan. Patient completed only diflucan and shared there was no relief after completing the two. Patient refuses to take additional antibiotics until her yeast infection is cleared.

## 2020-06-06 ENCOUNTER — Other Ambulatory Visit: Payer: Self-pay | Admitting: Family Medicine

## 2020-06-06 ENCOUNTER — Encounter: Payer: Self-pay | Admitting: Family Medicine

## 2020-06-06 MED ORDER — CEFDINIR 300 MG PO CAPS: 300.0000 mg | ORAL_CAPSULE | Freq: Two times a day (BID) | ORAL | 0 refills | Status: AC

## 2020-06-06 MED ORDER — ALBUTEROL SULFATE HFA 108 (90 BASE) MCG/ACT IN AERS: 1.0000 | INHALATION_SPRAY | Freq: Four times a day (QID) | RESPIRATORY_TRACT | 2 refills | Status: DC | PRN

## 2020-06-06 NOTE — Telephone Encounter (Signed)
See if she would like a prescription for some diflucan to help her yeast infection and if she is still feeling badly she needs to either try an antibiotic or be evaluated somewhere or both

## 2020-06-08 ENCOUNTER — Other Ambulatory Visit: Payer: Self-pay

## 2020-06-08 ENCOUNTER — Ambulatory Visit
Admission: RE | Admit: 2020-06-08 | Discharge: 2020-06-08 | Disposition: A | Discharge status: Home / Self Care | Payer: 59 | Source: Ambulatory Visit | Attending: Emergency Medicine | Admitting: Emergency Medicine

## 2020-06-08 ENCOUNTER — Ambulatory Visit (INDEPENDENT_AMBULATORY_CARE_PROVIDER_SITE_OTHER): Payer: 59

## 2020-06-08 VITALS — BP 121/84 | HR 106 | Temp 97.9°F | Resp 20

## 2020-06-08 DIAGNOSIS — R059 Cough, unspecified: Secondary | ICD-10-CM | POA: Diagnosis not present

## 2020-06-08 NOTE — ED Provider Notes (Signed)
[facility]    CSN: [id number] Arrival date & time: 06/08/20  1406      History   Chief Complaint Chief Complaint  Patient presents with  . Cough    2-3 weeks  . Fatigue    x 1 week    HPI Zylah Elsbernd is a 29 y.o. female  Presenting for 3-week course of dry cough.  States sometimes she has productive cough without blood.  No chest pain, difficulty breathing, fever or malaise.  Wanting to screen for pneumonia as she has a history thereof.  Has undergone Covid testing already: Negative.  Past Medical History:  Diagnosis Date  . Abnormal Pap smear of cervix 07/11/2011  . Acid reflux   . Anxiety   . Asthma    activity induced  . Irregular menses   . Polycystic ovarian syndrome     Patient Active Problem List   Diagnosis Date Noted  . Vaginal yeast infection 05/29/2020  . 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 GAD (generalized anxiety disorder) 03/12/2020  . Panic disorder 03/12/2020  . Adult ADHD 03/12/2020  . Tachycardia 12/16/2019  . Cervical cancer screening 11/14/2019  . Preventative health care 11/14/2019  . Poor concentration 10/23/2019  . Anxiety 10/23/2019  . COVID-19 09/25/2019  . Dehydration 09/25/2019  . Morbid obesity ([facility]) 01/31/2016  . Hip abductor tendinitis 01/23/2015  . Acanthosis nigricans 09/12/2014  . Snoring 09/12/2014  . Oligo-ovulation 07/04/2014  . Nausea with vomiting 11/21/2013  . Sinusitis 11/21/2013  . Nevus 02/11/2013  . GERD (gastroesophageal reflux disease) 02/11/2013  . Epigastric pain 07/11/2011  . PCOS (polycystic ovarian syndrome) 07/11/2011  . Abnormal Pap smear of cervix 07/11/2011  . Depression 07/11/2011  . Asthma 07/11/2011  . Weight gain 07/11/2011    Past Surgical History:  Procedure Laterality Date  . NO PAST SURGERIES      OB History   No obstetric history on file.      Home Medications    Prior to Admission medications   Medication Sig Start Date End Date Taking? Authorizing  Provider  albuterol (VENTOLIN HFA) 108 (90 Base) MCG/ACT inhaler Inhale 1-2 puffs into the lungs every 6 (six) hours as needed for wheezing or shortness of breath. 06/06/20  Yes [name], MD  busPIRone (BUSPAR) 15 MG tablet Take 1 tablet (15 mg total) by mouth 3 (three) times daily. 03/09/20  Yes [name], MD  escitalopram (LEXAPRO) 10 MG tablet Take 0.5 tablets (5 mg total) by mouth daily for 8 days, THEN 1 tablet (10 mg total) daily. 05/17/20 06/24/20 Yes Pucilowski, Olgierd A, MD  famotidine (PEPCID) 40 MG tablet Take 1 tablet (40 mg total) by mouth at bedtime. 01/16/20  Yes [name], MD  LORazepam (ATIVAN) 1 MG tablet Take 1 tablet (1 mg total) by mouth 3 (three) times daily as needed for anxiety. 05/17/20 07/16/20 Yes Pucilowski, Olgierd A, MD  methylphenidate (RITALIN) 20 MG tablet Take 1 tablet (20 mg total) by mouth 3 (three) times daily with meals. 05/18/20 06/17/20 Yes Pucilowski, Olgierd A, MD  ondansetron (ZOFRAN ODT) 4 MG disintegrating tablet Take 1 tablet (4 mg total) by mouth every 8 (eight) hours as needed for nausea or vomiting. 05/20/20  Yes Inda Coke, PA  ondansetron (ZOFRAN) 4 MG tablet Take by mouth. 05/21/20  Yes [provider]  pantoprazole (PROTONIX) 40 MG tablet Take 1 tablet (40 mg total) by mouth daily. 10/25/19  Yes Milus Banister, MD  terconazole (TERAZOL 7) 0.4 %  vaginal cream Place 1 applicator vaginally at bedtime. 05/29/20  Yes Mayers, Cari S, PA-C  cefdinir (OMNICEF) 300 MG capsule Take 1 capsule (300 mg total) by mouth 2 (two) times daily for 10 days. 06/06/20 06/16/20  [name], MD  methylphenidate (RITALIN) 20 MG tablet Take 1 tablet (20 mg total) by mouth 3 (three) times daily with meals. 06/17/20 07/17/20  Pucilowski, Marchia Bond, MD  methylphenidate (RITALIN) 20 MG tablet Take 1 tablet (20 mg total) by mouth 3 (three) times daily with meals. 07/17/20 08/16/20  Pucilowski, Marchia Bond, MD  promethazine (PHENERGAN) 25 MG tablet Take  1 tablet (25 mg total) by mouth as needed for nausea or vomiting (during the day). 12/16/19 06/08/20  [name], MD    Family History Family History  Problem Relation Age of Onset  . Gallbladder disease Mother   . Colon polyps Mother   . Hypertension Father   . Other Father        Hepatitis A  . Diabetes Father   . Drug abuse Father   . Post-traumatic stress disorder Father   . Stomach cancer Maternal Grandmother   . Diabetes Maternal Grandmother   . Colon polyps Maternal Grandmother   . Colon cancer Maternal Grandmother   . Cancer Maternal Grandmother        stomach cancer  . Alcohol abuse Maternal Grandmother   . Emphysema Maternal Grandfather   . Cancer Maternal Grandfather        smoke, lung cancer  . Alcohol abuse Maternal Grandfather   . Heart disease Paternal Grandfather   . Alcohol abuse Paternal Grandfather   . Breast cancer Other        mat great aunts x 2  . Colon cancer Other        mat great uncles x 2  . Irritable bowel syndrome Sister   . Multiple sclerosis Sister   . Bipolar disorder Sister   . Colon polyps Paternal Uncle   . Alcohol abuse Paternal Grandmother   . Pancreatic cancer Neg Hx   . Esophageal cancer Neg Hx     Social History Social History   Tobacco Use  . Smoking status: Never Smoker  . Smokeless tobacco: Never Used  Vaping Use  . Vaping Use: Never used  Substance Use Topics  . Alcohol use: Not Currently    Alcohol/week: 4.0 - 7.0 standard drinks    Types: 4 - 7 Cans of beer per week    Comment: 1 beer every other ay  . Drug use: Not Currently    Types: Marijuana     Allergies   Benadryl [diphenhydramine hcl]   Review of Systems As per HPI   Physical Exam Triage Vital Signs ED Triage Vitals  Enc Vitals Group     BP      Pulse      Resp      Temp      Temp src      SpO2      Weight      Height      Head Circumference      Peak Flow      Pain Score      Pain Loc      Pain Edu?      Excl. in [place]?    No  data found.  Updated Vital Signs BP 121/84 (BP Location: Left Arm)   Pulse (!) 106   Temp 97.9 F (36.6 C) (Oral)   Resp 20  SpO2 98%   Visual Acuity Right Eye Distance:   Left Eye Distance:   Bilateral Distance:    Right Eye Near:   Left Eye Near:    Bilateral Near:     Physical Exam Constitutional:      General: She is not in acute distress.    Appearance: She is not ill-appearing or diaphoretic.  HENT:     Head: Normocephalic and atraumatic.     Mouth/Throat:     Mouth: Mucous membranes are moist.     Pharynx: Oropharynx is clear. No oropharyngeal exudate or posterior oropharyngeal erythema.  Eyes:     General: No scleral icterus.    Conjunctiva/sclera: Conjunctivae normal.     Pupils: Pupils are equal, round, and reactive to light.  Neck:     Comments: Trachea midline, negative JVD Cardiovascular:     Rate and Rhythm: Normal rate and regular rhythm.     Heart sounds: No murmur heard.  No gallop.   Pulmonary:     Effort: Pulmonary effort is normal. No respiratory distress.     Breath sounds: No wheezing, rhonchi or rales.  Musculoskeletal:     Cervical back: Neck supple. No tenderness.  Lymphadenopathy:     Cervical: No cervical adenopathy.  Skin:    Capillary Refill: Capillary refill takes less than 2 seconds.     Coloration: Skin is not jaundiced or pale.     Findings: No rash.  Neurological:     General: No focal deficit present.     Mental Status: She is alert and oriented to person, place, and time.      UC Treatments / Results  Labs (all labs ordered are listed, but only abnormal results are displayed) Labs Reviewed - No data to display  EKG   Radiology DG Chest 2 View  Result Date: 06/08/2020 CLINICAL DATA:  Dry cough for 3 weeks, fatigue for 1 week, asthma EXAM: CHEST - 2 VIEW COMPARISON:  09/27/2019 FINDINGS: Upper normal heart size. Mediastinal contours and pulmonary vascularity normal. Lungs clear. No infiltrate, pleural effusion or  pneumothorax. Bones unremarkable. IMPRESSION: No acute abnormalities. Electronically Signed   By: Lavonia Dana M.D.   On: 06/08/2020 14:53    Procedures Procedures (including critical care time)  Medications Ordered in UC Medications - No data to display  Initial Impression / Assessment and Plan / UC Course  I have reviewed the triage vital signs and the nursing notes.  Pertinent labs & imaging results that were available during my care of the patient were reviewed by me and considered in my medical decision making (see chart for details).     Patient afebrile, nontoxic in office today.  Lung exam reassuring.  Chest x-ray negative.  Will treat supportively, follow up w/ PCP for further eval of subacute cough.  Return precautions discussed, pt verbalized understanding and is agreeable to plan. Final Clinical Impressions(s) / UC Diagnoses   Final diagnoses:  Cough   Discharge Instructions   None    ED Prescriptions    None     PDMP not reviewed this encounter.   Hall-Potvin, [place], [place] 06/08/20 1502

## 2020-06-08 NOTE — Telephone Encounter (Signed)
Pt contacted for providers response to ABT use. Pt concerned with continual use of ABT. States she has already taken diflucan 3x this would be her 4th. Would like to get relief

## 2020-06-08 NOTE — ED Triage Notes (Signed)
Pt states she has had a generally non productive cough x approximately 3 weeks and been fatigued for 1 week. Pt denies fever and ear pain. Pt is aox4 and ambulatory.

## 2020-06-14 ENCOUNTER — Telehealth (INDEPENDENT_AMBULATORY_CARE_PROVIDER_SITE_OTHER): Payer: 59 | Admitting: Psychiatry

## 2020-06-14 ENCOUNTER — Other Ambulatory Visit: Payer: Self-pay

## 2020-06-14 DIAGNOSIS — F32A Depression, unspecified: Secondary | ICD-10-CM | POA: Diagnosis not present

## 2020-06-14 DIAGNOSIS — F909 Attention-deficit hyperactivity disorder, unspecified type: Secondary | ICD-10-CM | POA: Diagnosis not present

## 2020-06-14 DIAGNOSIS — F410 Panic disorder [episodic paroxysmal anxiety] without agoraphobia: Secondary | ICD-10-CM | POA: Diagnosis not present

## 2020-06-14 DIAGNOSIS — F411 Generalized anxiety disorder: Secondary | ICD-10-CM

## 2020-06-14 MED ORDER — ESCITALOPRAM OXALATE 20 MG PO TABS: 20.0000 mg | ORAL_TABLET | Freq: Every day | ORAL | 1 refills | Status: DC

## 2020-06-14 MED ORDER — ONDANSETRON 4 MG PO TBDP: 4.0000 mg | ORAL_TABLET | Freq: Three times a day (TID) | ORAL | 1 refills | Status: AC | PRN

## 2020-06-14 MED ORDER — DIAZEPAM 5 MG PO TABS: 5.0000 mg | ORAL_TABLET | Freq: Two times a day (BID) | ORAL | 2 refills | Status: DC | PRN

## 2020-06-14 NOTE — Progress Notes (Signed)
BH MD/PA/NP OP Progress Note  06/14/2020 1:29 PM Melissa [name]  MRN:  [id number] Interview was conducted by phone as she could not connect to videoconferencing application and I verified that I was speaking with the correct person using two identifiers. I discussed the limitations of evaluation and management by telemedicine and  the availability of in person appointments. Patient expressed understanding and agreed to proceed. Patient location - home; physician - home office.  Chief Complaint: Depression, anxiety, nausea.  HPI: 29 yo MAAF who has been referred to [place] by her PCP for treatment of depression/anxiety and problems with concentration, task completion, forgetfulness consistent with inattentive form of ADHD. Melissa [name] has never been under care of psychiatry - Dr. Charlett Blake has started her on a combination of sertraline and buspirone in January thus year to help with depression and anxiety. 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 She has been feeling overwhelmed, worrying, depressed, distractible, having racing thoughts, difficulty falling asleep and struggling with sustaining attention, procrastination, task completion, forgetfulness. She also has panic attacks which have become more frequent lately - occurring daily. 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 Melissa [name] reports that buspirone has helped with anxiety whereas she does not see clear decrease in depression since starting sertraline (even after we increased it to 200 mg). She was briefly switched to fluoxetine 40 mg but it boosted her appetite (she is obese) so it was stopped. She hasalsotriedcitalopram briefly prior to sertraline. In March she was started on methylphenidate 20 mg bid for adult ADHD with some benefit.We increased dose to  30 mg and it seems to work better but she is complaining of nausea, forgetting to take it in PM.  Ambien CR 12.5 mg is minimally helpful for middle insomnia.She actually takes two 1 mg tabs of lorazepam for insomnia. We have stopped sertraline and started duloxetine but she reports having marked nausea ever since dose was increased to 60 mg. We then changed it to escitalopram which she tolerates well but did not notice any change in anxiety/depression level. She tried Concerta but could not tolerate it and is back on Ritalin.   Visit Diagnosis:    ICD-10-CM   1. Adult ADHD  F90.9   2. Panic disorder  F41.0   3. GAD (generalized anxiety disorder)  F41.1   4. Depression, unspecified depression type  F32.A     Past Psychiatric History: Please see intake H&P.  Past Medical History:  Past Medical History:  Diagnosis Date  . Abnormal Pap smear of cervix 07/11/2011  . Acid reflux   . Anxiety   . Asthma    activity induced  . Irregular menses   . Polycystic ovarian syndrome     Past Surgical History:  Procedure Laterality Date  . NO PAST SURGERIES      Family Psychiatric History: Reviewed.  Family History:  Family History  Problem Relation Age of Onset  . Gallbladder disease Mother   . Colon polyps Mother   . Hypertension Father   . Other Father        Hepatitis A  . Diabetes Father   . Drug abuse Father   . Post-traumatic stress disorder Father   . Stomach cancer Maternal Grandmother   . Diabetes Maternal Grandmother   . Colon polyps Maternal  Grandmother   . Colon cancer Maternal Grandmother   . Cancer Maternal Grandmother        stomach cancer  . Alcohol abuse Maternal Grandmother   . Emphysema Maternal Grandfather   . Cancer Maternal Grandfather        smoke, lung cancer  . Alcohol abuse Maternal Grandfather   . Heart disease Paternal Grandfather   . Alcohol abuse Paternal Grandfather   . Breast cancer Other        mat great aunts x 2  . Colon cancer Other         mat great uncles x 2  . Irritable bowel syndrome Sister   . Multiple sclerosis Sister   . Bipolar disorder Sister   . Colon polyps Paternal Uncle   . Alcohol abuse Paternal Grandmother   . Pancreatic cancer Neg Hx   . Esophageal cancer Neg Hx     Social History:  Social History   Socioeconomic History  . Marital status: Married    Spouse name: Not on file  . Number of children: 0  . Years of education: Not on file  . Highest education level: Not on file  Occupational History  . Occupation: Lexicographer: [employer]   Tobacco Use  . Smoking status: Never Smoker  . Smokeless tobacco: Never Used  Vaping Use  . Vaping Use: Never used  Substance and Sexual Activity  . Alcohol use: Not Currently    Alcohol/week: 4.0 - 7.0 standard drinks    Types: 4 - 7 Cans of beer per week    Comment: 1 beer every other ay  . Drug use: Not Currently    Types: Marijuana  . Sexual activity: Yes    Partners: Male    Birth control/protection: None  Other Topics Concern  . Not on file  Social History Narrative   Caffeine use:  1 can soda daily   Regular exercise:  Just started   2 sisters.      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    Social Determinants of Health   Financial Resource Strain:   . Difficulty of Paying Living Expenses: Not on file  Food Insecurity:   . Worried About Charity fundraiser in the Last Year: Not on file  . Ran Out of Food in the Last Year: Not on file  Transportation Needs:   . Lack of Transportation (Medical): Not on file  . Lack of Transportation (Non-Medical): Not on file  Physical Activity:   . Days of Exercise per Week: Not on file  . Minutes of Exercise per Session: Not on file  Stress:   . Feeling of Stress : Not on file  Social Connections:   . Frequency of Communication with Friends  and Family: Not on file  . Frequency of Social Gatherings with Friends and Family: Not on file  . Attends Religious Services: Not on file  . Active Member of Clubs or Organizations: Not on file  . Attends Archivist Meetings: Not on file  . Marital Status: Not on file    Allergies:  Allergies  Allergen Reactions  . Benadryl [Diphenhydramine Hcl] Other (See Comments)    Heart races    Metabolic Disorder Labs: Lab Results  Component Value Date   HGBA1C 6.0  11/15/2019   No results found for: PROLACTIN Lab Results  Component Value Date   CHOL 208 (H) 11/14/2019   TRIG 100.0 11/14/2019   HDL 48.80 11/14/2019   CHOLHDL 4 11/14/2019   VLDL 20.0 11/14/2019   LDLCALC 139 (H) 11/14/2019   LDLCALC 116 (H) 02/09/2017   Lab Results  Component Value Date   TSH 1.39 11/14/2019   TSH 1.78 02/09/2017    Therapeutic Level Labs: No results found for: LITHIUM No results found for: VALPROATE No components found for:  CBMZ  Current Medications: Current Outpatient Medications  Medication Sig Dispense Refill  . albuterol (VENTOLIN HFA) 108 (90 Base) MCG/ACT inhaler Inhale 1-2 puffs into the lungs every 6 (six) hours as needed for wheezing or shortness of breath. 18 g 2  . busPIRone (BUSPAR) 15 MG tablet Take 1 tablet (15 mg total) by mouth 3 (three) times daily. 270 tablet 1  . cefdinir (OMNICEF) 300 MG capsule Take 1 capsule (300 mg total) by mouth 2 (two) times daily for 10 days. 20 capsule 0  . diazepam (VALIUM) 5 MG tablet Take 1 tablet (5 mg total) by mouth 2 (two) times daily as needed for anxiety. 60 tablet 2  . escitalopram (LEXAPRO) 20 MG tablet Take 1 tablet (20 mg total) by mouth daily. 30 tablet 1  . famotidine (PEPCID) 40 MG tablet Take 1 tablet (40 mg total) by mouth at bedtime. 30 tablet 3  . methylphenidate (RITALIN) 20 MG tablet Take 1 tablet (20 mg total) by mouth 3 (three) times daily with meals. 90 tablet 0  . [START ON 06/17/2020] methylphenidate (RITALIN)  20 MG tablet Take 1 tablet (20 mg total) by mouth 3 (three) times daily with meals. 90 tablet 0  . [START ON 07/17/2020] methylphenidate (RITALIN) 20 MG tablet Take 1 tablet (20 mg total) by mouth 3 (three) times daily with meals. 90 tablet 0  . ondansetron (ZOFRAN-ODT) 4 MG disintegrating tablet Take 1 tablet (4 mg total) by mouth every 8 (eight) hours as needed for nausea or vomiting. 20 tablet 1  . pantoprazole (PROTONIX) 40 MG tablet Take 1 tablet (40 mg total) by mouth daily. 30 tablet 11  . terconazole (TERAZOL 7) 0.4 % vaginal cream Place 1 applicator vaginally at bedtime. 45 g 0   No current facility-administered medications for this visit.     Psychiatric Specialty Exam: Review of Systems  Gastrointestinal: Positive for nausea.  Psychiatric/Behavioral: Positive for decreased concentration and sleep disturbance. The patient is nervous/anxious.   All other systems reviewed and are negative.   There were no vitals taken for this visit.There is no height or weight on file to calculate BMI.  General Appearance: NA  Eye Contact:  NA  Speech:  Clear and Coherent and Normal Rate  Volume:  Normal  Mood:  Anxious and Depressed  Affect:  NA  Thought Process:  Goal Directed  Orientation:  Full (Time, Place, and Person)  Thought Content: Logical   Suicidal Thoughts:  No  Homicidal Thoughts:  No  Memory:  Immediate;   Good Recent;   Good Remote;   Good  Judgement:  Good  Insight:  Fair  Psychomotor Activity:  NA  Concentration:  Concentration: Fair  Recall:  Good  Fund of Knowledge: Good  Language: Good  Akathisia:  Negative  Handed:  Right  AIMS (if indicated): not done  Assets:  Communication Skills Desire for Improvement Financial Resources/Insurance Housing Resilience  ADL's:  Intact  Cognition: WNL  Sleep:  Fair  Screenings: PHQ2-9     Office Visit from 11/14/2019 in [facility] at [place] Visit from 01/31/2016 in [facility] at [facility]  PHQ-2 Total Score 6 0  PHQ-9 Total Score 24 --       Assessment and Plan: 29 yo MAAF who has been referred to [place] by her PCP for treatment of depression/anxiety and problems with concentration, task completion, forgetfulness consistent with inattentive form of ADHD. Jerome has never been under care of psychiatry - Dr. Charlett Blake has started her on a combination of sertraline and buspirone in January this year to help with depression and anxiety. 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 She has been feeling overwhelmed, worrying, depressed, distractible, having racing thoughts, difficulty falling asleep and struggling with sustaining attention, procrastination, task completion, forgetfulness. She also has panic attacks which have become more frequent lately - occurring daily. 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 Melissa [name] reports that buspirone has helped with anxiety whereas she does not see clear decrease in depression since starting sertraline (even after we increased it to 200 mg). She was briefly switched to fluoxetine 40 mg but it boosted her appetite (she is obese) so it was stopped. She hasalsotriedcitalopram briefly prior to sertraline. In March she was started on methylphenidate 20 mg bid for adult ADHD with some benefit.We increased dose to 30 mg and it seems to work better but she is complaining of nausea, forgetting to take it in PM.  Ambien CR 12.5 mg is minimally helpful for middle insomnia.She actually takes two 1 mg tabs of lorazepam for insomnia. We have stopped sertraline and started duloxetine but she reports having marked nausea ever since dose was increased to 60 mg. We then changed it to escitalopram which she tolerates well but did  not notice any change in anxiety/depression level. She tried Concerta but could not tolerate it and is back on Ritalin.  Dx: Mixed anxiety (GAD, panic disorder);  Depression; Adult ADHD  Plan: We will continue buspirone 15 mg tid,increase escitalopram to 20 mg daily, continue Ritalin 20 mg (she will tyry taking it 20 mg tid rather than 30 bid as her work/study days are long. I will add diazepam 5 mg bid prn anxiety/insomnia - lorazepam 2 mg was not very effective.  She will continue Zofran prn nausea for the time being.  Next visit in4weeks.The plan was discussed with patient who had an opportunity to ask questions and these were all answered. I spend76minutes in phone  consultationwith the patient.    Stephanie Acre, MD 06/14/2020, 1:29 PM

## 2020-06-19 ENCOUNTER — Telehealth (HOSPITAL_COMMUNITY): Payer: Self-pay | Admitting: *Deleted

## 2020-06-19 NOTE — Telephone Encounter (Signed)
Pt called stating that the Valium is keeping her awake at night. Nurse advised that the Ritalin may be keeping her up as she is taking last dose around 1600. Pt says that she takes 2 Valium at HS as well as an Ativan. Pt was advised not to take both benzos at the same time and that the Valium was meant to replace the Ativan. Pt is also reporting that she is taking the Ativan or Valium with the Ritalin. Pt next appointment is on 11/16. Please review and advise.

## 2020-06-20 NOTE — Telephone Encounter (Signed)
Of course it is Ritalin not Valium that is keeping her up. She should try breaking 20 mg tablet in half and taking 10 mg in the afternoon - not later than 4 PM.

## 2020-07-03 ENCOUNTER — Telehealth (HOSPITAL_COMMUNITY): Payer: Self-pay | Admitting: *Deleted

## 2020-07-03 NOTE — Telephone Encounter (Signed)
FYI pt called asking for refill of the Ritalin which she states she doesn't even take all the time, but sometimes spills them in her purse. Next fill date is 07/17/20, same day as her next appointment.

## 2020-07-04 ENCOUNTER — Other Ambulatory Visit (HOSPITAL_COMMUNITY): Payer: Self-pay | Admitting: Psychiatry

## 2020-07-04 ENCOUNTER — Telehealth (HOSPITAL_COMMUNITY): Payer: Self-pay | Admitting: *Deleted

## 2020-07-04 NOTE — Telephone Encounter (Signed)
I will not change Rx. No exception for stimulants unless there are extenuating circumstances like going out of town, Diplomatic Services operational officer.

## 2020-07-04 NOTE — Telephone Encounter (Signed)
She has refills already ordered - pharmacy will not refill these prescriptions earlier than 30 days from previous refill.

## 2020-07-04 NOTE — Telephone Encounter (Signed)
FYI pt left long VM requesting, again, an early refill on the Ritalin, or earlier appointment with you to refill. As noted in last encounter pt was informed yesterday that the next fill date is 11/16. Appointment also on 11/16.

## 2020-07-10 ENCOUNTER — Other Ambulatory Visit: Payer: Self-pay

## 2020-07-10 ENCOUNTER — Telehealth (INDEPENDENT_AMBULATORY_CARE_PROVIDER_SITE_OTHER): Payer: 59 | Admitting: Psychiatry

## 2020-07-10 DIAGNOSIS — F411 Generalized anxiety disorder: Secondary | ICD-10-CM

## 2020-07-10 DIAGNOSIS — F410 Panic disorder [episodic paroxysmal anxiety] without agoraphobia: Secondary | ICD-10-CM

## 2020-07-10 DIAGNOSIS — F909 Attention-deficit hyperactivity disorder, unspecified type: Secondary | ICD-10-CM

## 2020-07-10 MED ORDER — METHYLPHENIDATE HCL 20 MG PO TABS: 20.0000 mg | ORAL_TABLET | Freq: Three times a day (TID) | ORAL | 0 refills | Status: DC

## 2020-07-10 MED ORDER — ESCITALOPRAM OXALATE 20 MG PO TABS: 20.0000 mg | ORAL_TABLET | Freq: Every day | ORAL | 5 refills | Status: DC

## 2020-07-10 NOTE — Progress Notes (Signed)
BH MD/PA/NP OP Progress Note  07/10/2020 11:39 AM Melissa [name]  MRN:  [id number] Interview was conducted by phone and I verified that I was speaking with the correct person using two identifiers. I discussed the limitations of evaluation and management by telemedicine and  the availability of in person appointments. Patient expressed understanding and agreed to proceed. Patient location - home; physician - home office.  Chief Complaint: Some anxiety.  HPI: 29 yo MAAF with mixed anxiety and adult ADD as substantiated by chronic problems with concentration, task completion, forgetfulness consistent with inattentive form of ADHD. [name] has never been under care of psychiatry - Dr. Charlett Blake has started her on a combination of sertraline and buspirone in January this year to help with depression and anxiety. 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 She has been feeling overwhelmed, worrying, depressed, distractible, having racing thoughts, difficulty falling asleep and struggling with sustaining attention, procrastination, task completion, forgetfulness. She also had panic attacks which have since subsided (takes diazepam on as needed basis). [name] reports that buspirone has helped with anxiety whereas she does not see clear decrease in depression since starting sertraline (even after we increased it to 200 mg). She was briefly switched to fluoxetine 40 mg but it boosted her appetite (she is obese) so it was stopped. She hasalsotriedcitaloprambrieflyprior to sertraline. We have stopped sertraline and started duloxetine but she reports having marked nausea ever since dose was increased to 60 mg. We then changed it to escitalopram which she tolerates well and her mood has since improved. In March she was started on methylphenidate 20 mg bid for adult  ADHD with some benefit.We increased dose to30 mg and it seems to work betterbut she is complaining of nausea, forgetting to take it in PM.She now takes 20 mg up to tid depending on her schedule.    Visit Diagnosis:    ICD-10-CM   1. Adult ADHD  F90.9   2. Panic disorder  F41.0   3. GAD (generalized anxiety disorder)  F41.1     Past Psychiatric History: Please see intake H&P.  Past Medical History:  Past Medical History:  Diagnosis Date  . Abnormal Pap smear of cervix 07/11/2011  . Acid reflux   . Anxiety   . Asthma    activity induced  . Irregular menses   . Polycystic ovarian syndrome     Past Surgical History:  Procedure Laterality Date  . NO PAST SURGERIES      Family Psychiatric History: Reviewed.  Family History:  Family History  Problem Relation Age of Onset  . Gallbladder disease Mother   . Colon polyps Mother   . Hypertension Father   . Other Father        Hepatitis A  . Diabetes Father   . Drug abuse Father   . Post-traumatic stress disorder Father   . Stomach cancer Maternal Grandmother   . Diabetes Maternal Grandmother   . Colon polyps Maternal Grandmother   . Colon cancer Maternal Grandmother   . Cancer Maternal Grandmother        stomach cancer  . Alcohol abuse Maternal Grandmother   . Emphysema Maternal Grandfather   . Cancer Maternal Grandfather        smoke, lung cancer  . Alcohol abuse Maternal Grandfather   . Heart disease Paternal Grandfather   . Alcohol  abuse Paternal Grandfather   . Breast cancer Other        mat great aunts x 2  . Colon cancer Other        mat great uncles x 2  . Irritable bowel syndrome Sister   . Multiple sclerosis Sister   . Bipolar disorder Sister   . Colon polyps Paternal Uncle   . Alcohol abuse Paternal Grandmother   . Pancreatic cancer Neg Hx   . Esophageal cancer Neg Hx     Social History:  Social History   Socioeconomic History  . Marital status: Married    Spouse name: Not on file  . Number  of children: 0  . Years of education: Not on file  . Highest education level: Not on file  Occupational History  . Occupation: Lexicographer: [employer]   Tobacco Use  . Smoking status: Never Smoker  . Smokeless tobacco: Never Used  Vaping Use  . Vaping Use: Never used  Substance and Sexual Activity  . Alcohol use: Not Currently    Alcohol/week: 4.0 - 7.0 standard drinks    Types: 4 - 7 Cans of beer per week    Comment: 1 beer every other ay  . Drug use: Not Currently    Types: Marijuana  . Sexual activity: Yes    Partners: Male    Birth control/protection: None  Other Topics Concern  . Not on file  Social History Narrative   Caffeine use:  1 can soda daily   Regular exercise:  Just started   2 sisters.      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    Social Determinants of Health   Financial Resource Strain:   . Difficulty of Paying Living Expenses: Not on file  Food Insecurity:   . Worried About Charity fundraiser in the Last Year: Not on file  . Ran Out of Food in the Last Year: Not on file  Transportation Needs:   . Lack of Transportation (Medical): Not on file  . Lack of Transportation (Non-Medical): Not on file  Physical Activity:   . Days of Exercise per Week: Not on file  . Minutes of Exercise per Session: Not on file  Stress:   . Feeling of Stress : Not on file  Social Connections:   . Frequency of Communication with Friends and Family: Not on file  . Frequency of Social Gatherings with Friends and Family: Not on file  . Attends Religious Services: Not on file  . Active Member of Clubs or Organizations: Not on file  . Attends Archivist Meetings: Not on file  . Marital Status: Not on file    Allergies:  Allergies  Allergen Reactions  . Benadryl [Diphenhydramine Hcl] Other (See  Comments)    Heart races    Metabolic Disorder Labs: Lab Results  Component Value Date   HGBA1C 6.0 11/15/2019   No results found for: PROLACTIN Lab Results  Component Value Date   CHOL 208 (H) 11/14/2019   TRIG 100.0 11/14/2019   HDL 48.80 11/14/2019   CHOLHDL 4 11/14/2019   VLDL 20.0 11/14/2019   LDLCALC 139 (H) 11/14/2019   LDLCALC 116 (H) 02/09/2017   Lab Results  Component Value Date   TSH 1.39 11/14/2019   TSH  1.78 02/09/2017    Therapeutic Level Labs: No results found for: LITHIUM No results found for: VALPROATE No components found for:  CBMZ  Current Medications: Current Outpatient Medications  Medication Sig Dispense Refill  . albuterol (VENTOLIN HFA) 108 (90 Base) MCG/ACT inhaler Inhale 1-2 puffs into the lungs every 6 (six) hours as needed for wheezing or shortness of breath. 18 g 2  . busPIRone (BUSPAR) 15 MG tablet Take 1 tablet (15 mg total) by mouth 3 (three) times daily. 270 tablet 1  . diazepam (VALIUM) 5 MG tablet Take 1 tablet (5 mg total) by mouth 2 (two) times daily as needed for anxiety. 60 tablet 2  . escitalopram (LEXAPRO) 20 MG tablet Take 1 tablet (20 mg total) by mouth daily. 30 tablet 5  . famotidine (PEPCID) 40 MG tablet Take 1 tablet (40 mg total) by mouth at bedtime. 30 tablet 3  . methylphenidate (RITALIN) 20 MG tablet Take 1 tablet (20 mg total) by mouth 3 (three) times daily with meals. 90 tablet 0  . [START ON 08/09/2020] methylphenidate (RITALIN) 20 MG tablet Take 1 tablet (20 mg total) by mouth 3 (three) times daily with meals. 90 tablet 0  . [START ON 09/08/2020] methylphenidate (RITALIN) 20 MG tablet Take 1 tablet (20 mg total) by mouth 3 (three) times daily with meals. 90 tablet 0  . ondansetron (ZOFRAN-ODT) 4 MG disintegrating tablet Take 1 tablet (4 mg total) by mouth every 8 (eight) hours as needed for nausea or vomiting. 20 tablet 1  . pantoprazole (PROTONIX) 40 MG tablet Take 1 tablet (40 mg total) by mouth daily. 30 tablet 11  .  terconazole (TERAZOL 7) 0.4 % vaginal cream Place 1 applicator vaginally at bedtime. 45 g 0   No current facility-administered medications for this visit.      Psychiatric Specialty Exam: Review of Systems  Psychiatric/Behavioral: The patient is nervous/anxious.   All other systems reviewed and are negative.   There were no vitals taken for this visit.There is no height or weight on file to calculate BMI.  General Appearance: NA  Eye Contact:  NA  Speech:  Clear and Coherent and Normal Rate  Volume:  Normal  Mood:  Anxious  Affect:  NA  Thought Process:  Goal Directed and Linear  Orientation:  Full (Time, Place, and Person)  Thought Content: Logical   Suicidal Thoughts:  No  Homicidal Thoughts:  No  Memory:  Immediate;   Good Recent;   Good Remote;   Good  Judgement:  Good  Insight:  Good  Psychomotor Activity:  NA  Concentration:  Concentration: Good  Recall:  Good  Fund of Knowledge: Good  Language: Good  Akathisia:  Negative  Handed:  Right  AIMS (if indicated): not done  Assets:  Communication Skills Desire for Improvement Financial Resources/Insurance Housing Resilience Social Support Talents/Skills  ADL's:  Intact  Cognition: WNL  Sleep:  Good   Screenings: PHQ2-9     Office Visit from 11/14/2019 in [facility] at [place] Visit from 01/31/2016 in [place] at [facility]  PHQ-2 Total Score 6 0  PHQ-9 Total Score 24 --       Assessment and Plan: 29 yo MAAF with mixed anxiety and adult ADD as substantiated by chronic problems with concentration, task completion, forgetfulness consistent with inattentive form of ADHD. Aneta has never been under care of psychiatry - Dr. Charlett Blake has started her on a combination of sertraline and buspirone  in January this year to help with depression and anxiety. 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 She has been feeling overwhelmed, worrying, depressed, distractible, having racing thoughts, difficulty falling asleep and struggling with sustaining attention, procrastination, task completion, forgetfulness. She also had panic attacks which have since subsided (takes diazepam on as needed basis). Heavan reports that buspirone has helped with anxiety whereas she does not see clear decrease in depression since starting sertraline (even after we increased it to 200 mg). She was briefly switched to fluoxetine 40 mg but it boosted her appetite (she is obese) so it was stopped. She hasalsotriedcitaloprambrieflyprior to sertraline. We have stopped sertraline and started duloxetine but she reports having marked nausea ever since dose was increased to 60 mg. We then changed it to escitalopram which she tolerates well and her mood has since improved. In March she was started on methylphenidate 20 mg bid for adult ADHD with some benefit.We increased dose to30 mg and it seems to work betterbut she is complaining of nausea, forgetting to take it in PM.She now takes 20 mg up to tid depending on her schedule.   Dx: Mixed anxiety (GAD, panic disorder); Adult ADHD  Plan: We will continue buspirone 15 mg tid,escitalopram 20 mg daily, Ritalin 20 mg up to tid, diazepam 5 mg bid prn anxiety/insomnia - lorazepam 2 mg was not very effective.  Next visit in2 months.The plan was discussed with patient who had an opportunity to ask questions and these were all answered. I spend57minutes in phone  consultationwith the patient.   Stephanie Acre, MD 07/10/2020, 11:39 AM

## 2020-07-17 ENCOUNTER — Telehealth (HOSPITAL_COMMUNITY): Payer: 59 | Admitting: Psychiatry

## 2020-07-17 ENCOUNTER — Other Ambulatory Visit: Payer: Self-pay | Admitting: Physician Assistant

## 2020-07-19 ENCOUNTER — Telehealth (HOSPITAL_COMMUNITY): Payer: Self-pay | Admitting: *Deleted

## 2020-07-19 ENCOUNTER — Other Ambulatory Visit (HOSPITAL_COMMUNITY): Payer: Self-pay | Admitting: Psychiatry

## 2020-07-19 MED ORDER — DIAZEPAM 5 MG PO TABS: 5.0000 mg | ORAL_TABLET | Freq: Three times a day (TID) | ORAL | 2 refills | Status: DC | PRN

## 2020-07-19 NOTE — Telephone Encounter (Signed)
I did change it to tid.

## 2020-07-19 NOTE — Telephone Encounter (Signed)
Pt called requesting to increase Valium to tid as she is not able to relax after taking the third dose and has increased anxious as well. Pt has previously been advised by nurse not too take third dose too late, side effects, and anxiety coping skills. Please review and advise.

## 2020-07-30 ENCOUNTER — Other Ambulatory Visit: Payer: Self-pay | Admitting: Family Medicine

## 2020-07-30 ENCOUNTER — Other Ambulatory Visit: Payer: Self-pay | Admitting: Physician Assistant

## 2020-09-06 ENCOUNTER — Other Ambulatory Visit: Payer: Medicaid Other

## 2020-09-10 ENCOUNTER — Other Ambulatory Visit: Payer: Self-pay

## 2020-09-10 ENCOUNTER — Telehealth (HOSPITAL_COMMUNITY): Payer: 59 | Admitting: Psychiatry

## 2020-09-10 ENCOUNTER — Telehealth (INDEPENDENT_AMBULATORY_CARE_PROVIDER_SITE_OTHER): Payer: 59 | Admitting: Psychiatry

## 2020-09-10 DIAGNOSIS — F410 Panic disorder [episodic paroxysmal anxiety] without agoraphobia: Secondary | ICD-10-CM | POA: Diagnosis not present

## 2020-09-10 DIAGNOSIS — F411 Generalized anxiety disorder: Secondary | ICD-10-CM

## 2020-09-10 DIAGNOSIS — F909 Attention-deficit hyperactivity disorder, unspecified type: Secondary | ICD-10-CM | POA: Diagnosis not present

## 2020-09-10 MED ORDER — DIAZEPAM 10 MG PO TABS: 10.0000 mg | ORAL_TABLET | Freq: Two times a day (BID) | ORAL | 1 refills | Status: DC | PRN

## 2020-09-10 MED ORDER — METHYLPHENIDATE HCL 20 MG PO TABS: 20.0000 mg | ORAL_TABLET | Freq: Two times a day (BID) | ORAL | 0 refills | Status: DC

## 2020-09-10 NOTE — Progress Notes (Signed)
[place] MD/PA/NP OP Progress Note  09/10/2020 3:45 PM Melissa [name]  MRN:  [id number] Interview was conducted by phone and I verified that I was speaking with the correct person using two identifiers. I discussed the limitations of evaluation and management by telemedicine and  the availability of in person appointments. Patient expressed understanding and agreed to proceed. Participants in the visit: patient (location - home); physician (location - home office).  Chief Complaint: Anxiety.  HPI: 30 yo MAAF with mixed anxiety and adult ADD as substantiated by chronic problems with concentration, task completion, forgetfulness consistent with inattentive form of ADHD. Melissa [name] has never been under care of psychiatry - Dr. Charlett Blake has started her on a combination of sertraline and buspirone in January this year to help with depression and anxiety. 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 She has been feeling overwhelmed, worrying, depressed, distractible, having racing thoughts, difficulty falling asleep and struggling with sustaining attention, procrastination, task completion, forgetfulness. She also had panic attacks which have since subsided (takes diazepam on as needed basis). Melissa [name] reports that buspirone has helped with anxiety whereas she does not see clear decrease in depression since starting sertraline (even after we increased it to 200 mg). She was briefly switched to fluoxetine 40 mg but it boosted her appetite (she is obese) so it was stopped. She hasalsotriedcitaloprambrieflyprior to sertraline. We have stopped sertraline and started duloxetine but she reports having marked nausea ever since dose was increased to 60 mg.We then changed it to escitalopram which she tolerates well and her mood has since improved. In March she was started on  methylphenidate 20 mg bid for adult ADHD with some benefit.We increased dose to30 mg and it seems to work betterbut she is complaining of nausea, forgetting to take it in PM.She now takes 20 mg twice daily - back at school. She does complain of anxiety though - diazepam 5 mg is helpful "to some extent".    Visit Diagnosis:    ICD-10-CM   1. Panic disorder  F41.0   2. GAD (generalized anxiety disorder)  F41.1   3. Adult ADHD  F90.9     Past Psychiatric History: Please see intake H&P.  Past Medical History:  Past Medical History:  Diagnosis Date  . Abnormal Pap smear of cervix 07/11/2011  . Acid reflux   . Anxiety   . Asthma    activity induced  . Irregular menses   . Polycystic ovarian syndrome     Past Surgical History:  Procedure Laterality Date  . NO PAST SURGERIES      Family Psychiatric History: Reviewed.  Family History:  Family History  Problem Relation Age of Onset  . Gallbladder disease Mother   . Colon polyps Mother   . Hypertension Father   . Other Father        Hepatitis A  . Diabetes Father   . Drug abuse Father   . Post-traumatic stress disorder Father   . Stomach cancer Maternal Grandmother   . Diabetes Maternal Grandmother   . Colon polyps Maternal Grandmother   . Colon cancer Maternal Grandmother   . Cancer Maternal Grandmother        stomach cancer  . Alcohol abuse Maternal Grandmother   . Emphysema Maternal Grandfather   . Cancer Maternal Grandfather        smoke, lung cancer  .  Alcohol abuse Maternal Grandfather   . Heart disease Paternal Grandfather   . Alcohol abuse Paternal Grandfather   . Breast cancer Other        mat great aunts x 2  . Colon cancer Other        mat great uncles x 2  . Irritable bowel syndrome Sister   . Multiple sclerosis Sister   . Bipolar disorder Sister   . Colon polyps Paternal Uncle   . Alcohol abuse Paternal Grandmother   . Pancreatic cancer Neg Hx   . Esophageal cancer Neg Hx     Social History:   Social History   Socioeconomic History  . Marital status: Married    Spouse name: Not on file  . Number of children: 0  . Years of education: Not on file  . Highest education level: Not on file  Occupational History  . Occupation: Lexicographer: [employer]   Tobacco Use  . Smoking status: Never Smoker  . Smokeless tobacco: Never Used  Vaping Use  . Vaping Use: Never used  Substance and Sexual Activity  . Alcohol use: Not Currently    Alcohol/week: 4.0 - 7.0 standard drinks    Types: 4 - 7 Cans of beer per week    Comment: 1 beer every other ay  . Drug use: Not Currently    Types: Marijuana  . Sexual activity: Yes    Partners: Male    Birth control/protection: None  Other Topics Concern  . Not on file  Social History Narrative   Caffeine use:  1 can soda daily   Regular exercise:  Just started   2 sisters.      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    Social Determinants of Health   Financial Resource Strain: Not on file  Food Insecurity: Not on file  Transportation Needs: Not on file  Physical Activity: Not on file  Stress: Not on file  Social Connections: Not on file    Allergies:  Allergies  Allergen Reactions  . Benadryl [Diphenhydramine Hcl] Other (See Comments)    Heart races    Metabolic Disorder Labs: Lab Results  Component Value Date   HGBA1C 6.0 11/15/2019   No results found for: PROLACTIN Lab Results  Component Value Date   CHOL 208 (H) 11/14/2019   TRIG 100.0 11/14/2019   HDL 48.80 11/14/2019   CHOLHDL 4 11/14/2019   VLDL 20.0 11/14/2019   LDLCALC 139 (H) 11/14/2019   LDLCALC 116 (H) 02/09/2017   Lab Results  Component Value Date   TSH 1.39 11/14/2019   TSH 1.78 02/09/2017    Therapeutic Level Labs: No results found for: LITHIUM No results found for: VALPROATE No  components found for:  CBMZ  Current Medications: Current Outpatient Medications  Medication Sig Dispense Refill  . diazepam (VALIUM) 10 MG tablet Take 1 tablet (10 mg total) by mouth 2 (two) times daily as needed for anxiety. 60 tablet 1  . albuterol (VENTOLIN HFA) 108 (90 Base) MCG/ACT inhaler Inhale 1-2 puffs into the lungs every 6 (six) hours as needed for wheezing or shortness of breath. 18 g 2  . busPIRone (BUSPAR) 15 MG tablet Take 1 tablet (15 mg total) by mouth 3 (three) times daily. 270 tablet 1  . escitalopram (LEXAPRO) 20  MG tablet Take 1 tablet (20 mg total) by mouth daily. 30 tablet 5  . famotidine (PEPCID) 40 MG tablet Take 1 tablet (40 mg total) by mouth at bedtime. 30 tablet 3  . methylphenidate (RITALIN) 20 MG tablet Take 1 tablet (20 mg total) by mouth 3 (three) times daily with meals. 90 tablet 0  . [START ON 09/14/2020] methylphenidate (RITALIN) 20 MG tablet Take 1 tablet (20 mg total) by mouth 2 (two) times daily with breakfast and lunch. 60 tablet 0  . [START ON 10/15/2020] methylphenidate (RITALIN) 20 MG tablet Take 1 tablet (20 mg total) by mouth 2 (two) times daily with breakfast and lunch. 60 tablet 0  . pantoprazole (PROTONIX) 40 MG tablet Take 1 tablet (40 mg total) by mouth daily. 30 tablet 11  . terconazole (TERAZOL 7) 0.4 % vaginal cream Place 1 applicator vaginally at bedtime. 45 g 0   No current facility-administered medications for this visit.     Psychiatric Specialty Exam: Review of Systems  Psychiatric/Behavioral: The patient is nervous/anxious.   All other systems reviewed and are negative.   There were no vitals taken for this visit.There is no height or weight on file to calculate BMI.  General Appearance: NA  Eye Contact:  NA  Speech:  Clear and Coherent and Normal Rate  Volume:  Normal  Mood:  Anxious  Affect:  NA  Thought Process:  Goal Directed  Orientation:  Full (Time, Place, and Person)  Thought Content: Logical   Suicidal Thoughts:  No   Homicidal Thoughts:  No  Memory:  Immediate;   Good Recent;   Good Remote;   Good  Judgement:  Good  Insight:  Fair  Psychomotor Activity:  NA  Concentration:  Concentration: Fair  Recall:  Good  Fund of Knowledge: Good  Language: Good  Akathisia:  Negative  Handed:  Right  AIMS (if indicated): not done  Assets:  Communication Skills Desire for Improvement Housing Resilience  ADL's:  Intact  Cognition: WNL  Sleep:  Fair   Screenings: PHQ2-9   [place] Office Visit from 11/14/2019 in [facility] at [place] Visit from 01/31/2016 in [place] at [facility]  PHQ-2 Total Score 6 0  PHQ-9 Total Score 24 --       Assessment and Plan: 30 yo MAAF with mixed anxiety and adult ADD as substantiated by chronic problems with concentration, task completion, forgetfulness consistent with inattentive form of ADHD. Ariza has never been under care of psychiatry - Dr. Charlett Blake has started her on a combination of sertraline and buspirone in January this year to help with depression and anxiety. 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 She has been feeling overwhelmed, worrying, depressed, distractible, having racing thoughts, difficulty falling asleep and struggling with sustaining attention, procrastination, task completion, forgetfulness. She also had panic attacks which have since subsided (takes diazepam on as needed basis). Ismael reports that buspirone has helped with anxiety whereas she does not see clear decrease in depression since starting sertraline (even after we increased it to 200 mg). She was briefly switched to fluoxetine 40 mg but it boosted her appetite (she is obese) so it was stopped. She hasalsotriedcitaloprambrieflyprior to sertraline. We have stopped  sertraline and started duloxetine but she reports having marked nausea ever since dose was  increased to 60 mg.We then changed it to escitalopram which she tolerates well and her mood has since improved. In March she was started on methylphenidate 20 mg bid for adult ADHD with some benefit.We increased dose to30 mg and it seems to work betterbut she is complaining of nausea, forgetting to take it in PM.She now takes 20 mg twice daily - back at school. She does complain of anxiety though - diazepam 5 mg is helpful "to some extent".  Dx: Mixed anxiety (GAD, panic disorder);Adult ADHD  Plan: We will continue buspirone 15 mg tid,escitalopram 20 mg daily, Ritalin 20 mg bid and increase diazepam to 10 mg bid prn anxiety/insomnia.Next visit in2 months.The plan was discussed with patient who had an opportunity to ask questions and these were all answered. I spend62minutes inphone consultationwith the patient.   Stephanie Acre, MD 09/10/2020, 3:45 PM

## 2020-09-12 ENCOUNTER — Other Ambulatory Visit: Payer: Medicaid Other

## 2020-09-13 ENCOUNTER — Other Ambulatory Visit: Payer: Medicaid Other

## 2020-09-20 ENCOUNTER — Telehealth (HOSPITAL_COMMUNITY): Payer: Self-pay

## 2020-09-20 NOTE — Telephone Encounter (Signed)
Received a fax from [facility] regarding patient's Diazepam 10mg  tablet. It stated that the patient reported that they miscounted 7 tablets and is requesting #7 additional fill. I spoke with her doctor and he approved the 7/ tablets. I then spoke with the pharmacy to relay the message. They stated that they are filling the 7 tablets for the patient to pay out of pocket so that the refill can be covered by insurance

## 2020-09-20 NOTE — Telephone Encounter (Signed)
I tried to contact the pt to relay the message but she did not pick up the phone and I couldn't LVM message

## 2020-09-21 ENCOUNTER — Telehealth (HOSPITAL_COMMUNITY): Payer: Self-pay

## 2020-09-21 NOTE — Telephone Encounter (Signed)
I did not know she has stopped it earlier. I think she should take 1/2 tablet (she is on 20 mg) for one week then increase to 20 mg and take this medication in the evening as it may be somewhat sedating. Side effects should subside and it will take few weeks before improvement in mood can be expected.

## 2020-09-21 NOTE — Telephone Encounter (Signed)
Patient called and stated that she started taking her Lexapro again on Monday 1/17. She stated that she sleeps all day, doesn't feel like doing anything, feels "weird" and still depressed. She wants to know if this is normal. Please review and advise. Thank you

## 2020-09-25 NOTE — Telephone Encounter (Signed)
Relayed message to patient from dr

## 2020-09-26 ENCOUNTER — Telehealth (HOSPITAL_COMMUNITY): Payer: Self-pay

## 2020-09-26 ENCOUNTER — Other Ambulatory Visit (HOSPITAL_COMMUNITY): Payer: Self-pay | Admitting: Psychiatry

## 2020-09-26 MED ORDER — DIAZEPAM 10 MG PO TABS: 10.0000 mg | ORAL_TABLET | Freq: Three times a day (TID) | ORAL | 1 refills | Status: DC | PRN

## 2020-09-26 NOTE — Telephone Encounter (Signed)
Yes I would. I also changed Rx from bid to tid as needed for anxiety.

## 2020-09-26 NOTE — Telephone Encounter (Signed)
I spoke with patient and the pharmacist at [facility] on [address] regarding patient's Diazepam 10mg . The pharmacist needs to know if you will authorize an early fill for 2/5. Please review and advise. Thank you

## 2020-09-29 ENCOUNTER — Other Ambulatory Visit (HOSPITAL_COMMUNITY): Payer: Self-pay | Admitting: Psychiatry

## 2020-10-02 NOTE — Telephone Encounter (Signed)
Relayed message to pharmacist. He stated that they're all set

## 2020-10-11 ENCOUNTER — Other Ambulatory Visit (HOSPITAL_COMMUNITY): Payer: Self-pay | Admitting: Psychiatry

## 2020-10-24 ENCOUNTER — Other Ambulatory Visit (HOSPITAL_COMMUNITY): Payer: Self-pay | Admitting: Psychiatry

## 2020-10-24 ENCOUNTER — Telehealth (HOSPITAL_COMMUNITY): Payer: Self-pay | Admitting: *Deleted

## 2020-10-24 MED ORDER — METHYLPHENIDATE HCL 20 MG PO TABS: 20.0000 mg | ORAL_TABLET | Freq: Three times a day (TID) | ORAL | 0 refills | Status: DC

## 2020-10-24 MED ORDER — METHYLPHENIDATE HCL 20 MG PO TABS: 20.0000 mg | ORAL_TABLET | Freq: Every day | ORAL | 0 refills | Status: DC

## 2020-10-24 NOTE — Telephone Encounter (Signed)
It was send for twice a day because that is what she told me she has been taking lately (in the past she indeed was prescribed three times a day). I just changed Rx to tid.

## 2020-10-24 NOTE — Telephone Encounter (Signed)
I saw that.  Guess she switched back.

## 2020-10-24 NOTE — Telephone Encounter (Signed)
Patient reports she takes Ritalin 3 times a day and that script sent in was for 60 pills instead of 90.  She has already picked up the 60 but would like the additional sent in and also escript for next month changed to 90.  Thanks.

## 2020-11-06 ENCOUNTER — Encounter: Payer: 59 | Admitting: Family Medicine

## 2020-11-07 ENCOUNTER — Other Ambulatory Visit (HOSPITAL_COMMUNITY): Payer: Self-pay | Admitting: Psychiatry

## 2020-11-07 MED ORDER — ESCITALOPRAM OXALATE 20 MG PO TABS: 20.0000 mg | ORAL_TABLET | Freq: Every day | ORAL | 0 refills | Status: DC

## 2020-11-08 ENCOUNTER — Telehealth (HOSPITAL_COMMUNITY): Payer: Self-pay | Admitting: Psychiatry

## 2020-11-08 ENCOUNTER — Other Ambulatory Visit: Payer: Self-pay

## 2020-11-08 ENCOUNTER — Telehealth (INDEPENDENT_AMBULATORY_CARE_PROVIDER_SITE_OTHER): Payer: 59 | Admitting: Psychiatry

## 2020-11-08 DIAGNOSIS — F909 Attention-deficit hyperactivity disorder, unspecified type: Secondary | ICD-10-CM

## 2020-11-08 DIAGNOSIS — F410 Panic disorder [episodic paroxysmal anxiety] without agoraphobia: Secondary | ICD-10-CM

## 2020-11-08 DIAGNOSIS — F411 Generalized anxiety disorder: Secondary | ICD-10-CM

## 2020-11-08 MED ORDER — AMPHETAMINE-DEXTROAMPHETAMINE 20 MG PO TABS: 20.0000 mg | ORAL_TABLET | Freq: Three times a day (TID) | ORAL | 0 refills | Status: DC

## 2020-11-08 MED ORDER — ONDANSETRON HCL 4 MG PO TABS: 4.0000 mg | ORAL_TABLET | Freq: Two times a day (BID) | ORAL | 0 refills | Status: DC | PRN

## 2020-11-08 NOTE — Progress Notes (Signed)
BH MD/PA/NP OP Progress Note  11/08/2020 8:42 AM Channell Quattrone  MRN:  [id number] Interview was conducted by phone and I verified that I was speaking with the correct person using two identifiers. I discussed the limitations of evaluation and management by telemedicine and  the availability of in person appointments. Patient expressed understanding and agreed to proceed. Participants in the visit: patient (location - home); physician (location - home office).  Chief Complaint: Lack of focus.  HPI: 30 yo MAAF with mixed anxiety and adult ADD as substantiated by chronicproblems with concentration, task completion, forgetfulness consistent with inattentive form of ADHD. Neelam has never been under care of psychiatry - Dr. Charlett Blake has started her on a combination of sertraline and buspirone in January this year to help with depression and anxiety. 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 She has been feeling overwhelmed, worrying, depressed, distractible, having racing thoughts, difficulty falling asleep and struggling with sustaining attention, procrastination, task completion, forgetfulness. She also hadpanic attacks which have since subsided (takes diazepam on as needed basis).Nicoya reports that buspirone has helped with anxiety whereas she does not see clear decrease in depression since starting sertraline (even after we increased it to 200 mg). She was briefly switched to fluoxetine 40 mg but it boosted her appetite (she is obese) so it was stopped. She hasalsotriedcitaloprambrieflyprior to sertraline. We have stopped sertraline and started duloxetine but she reports having marked nausea ever since dose was increased to 60 mg.We then changed it to escitalopram which she tolerates welland her mood has since improved.In March she was started  on methylphenidate 20 mg bid for adult ADHD with some benefit.We increased dose to30 mg and it seemed to work betterbut she was complaining of nausea.She now takes 20 mg twice to three times daily - back at school. She feels that this medication still does not help with focusing problems enough. She does complain of anxiety though - diazepam 5 mg is helpful "to some extent".     Visit Diagnosis:    ICD-10-CM   1. Adult ADHD  F90.9   2. Panic disorder  F41.0   3. GAD (generalized anxiety disorder)  F41.1     Past Psychiatric History: Please see intake H&P.  Past Medical History:  Past Medical History:  Diagnosis Date  . Abnormal Pap smear of cervix 07/11/2011  . Acid reflux   . Anxiety   . Asthma    activity induced  . Irregular menses   . Polycystic ovarian syndrome     Past Surgical History:  Procedure Laterality Date  . NO PAST SURGERIES      Family Psychiatric History: Reviewed.  Family History:  Family History  Problem Relation Age of Onset  . Gallbladder disease Mother   . Colon polyps Mother   . Hypertension Father   . Other Father        Hepatitis A  . Diabetes Father   . Drug abuse Father   . Post-traumatic stress disorder Father   . Stomach cancer Maternal Grandmother   . Diabetes Maternal Grandmother   . Colon polyps Maternal Grandmother   . Colon cancer Maternal Grandmother   . Cancer Maternal Grandmother        stomach cancer  . Alcohol abuse Maternal Grandmother   . Emphysema Maternal Grandfather   . Cancer Maternal Grandfather  smoke, lung cancer  . Alcohol abuse Maternal Grandfather   . Heart disease Paternal Grandfather   . Alcohol abuse Paternal Grandfather   . Breast cancer Other        mat great aunts x 2  . Colon cancer Other        mat great uncles x 2  . Irritable bowel syndrome Sister   . Multiple sclerosis Sister   . Bipolar disorder Sister   . Colon polyps Paternal Uncle   . Alcohol abuse Paternal Grandmother   .  Pancreatic cancer Neg Hx   . Esophageal cancer Neg Hx     Social History:  Social History   Socioeconomic History  . Marital status: Married    Spouse name: Not on file  . Number of children: 0  . Years of education: Not on file  . Highest education level: Not on file  Occupational History  . Occupation: Lexicographer: [employer]   Tobacco Use  . Smoking status: Never Smoker  . Smokeless tobacco: Never Used  Vaping Use  . Vaping Use: Never used  Substance and Sexual Activity  . Alcohol use: Not Currently    Alcohol/week: 4.0 - 7.0 standard drinks    Types: 4 - 7 Cans of beer per week    Comment: 1 beer every other ay  . Drug use: Not Currently    Types: Marijuana  . Sexual activity: Yes    Partners: Male    Birth control/protection: None  Other Topics Concern  . Not on file  Social History Narrative   Caffeine use:  1 can soda daily   Regular exercise:  Just started   2 sisters.      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    Social Determinants of Health   Financial Resource Strain: Not on file  Food Insecurity: Not on file  Transportation Needs: Not on file  Physical Activity: Not on file  Stress: Not on file  Social Connections: Not on file    Allergies:  Allergies  Allergen Reactions  . Benadryl [Diphenhydramine Hcl] Other (See Comments)    Heart races    Metabolic Disorder Labs: Lab Results  Component Value Date   HGBA1C 6.0 11/15/2019   No results found for: PROLACTIN Lab Results  Component Value Date   CHOL 208 (H) 11/14/2019   TRIG 100.0 11/14/2019   HDL 48.80 11/14/2019   CHOLHDL 4 11/14/2019   VLDL 20.0 11/14/2019   LDLCALC 139 (H) 11/14/2019   LDLCALC 116 (H) 02/09/2017   Lab Results  Component Value Date   TSH 1.39 11/14/2019   TSH 1.78 02/09/2017    Therapeutic Level  Labs: No results found for: LITHIUM No results found for: VALPROATE No components found for:  CBMZ  Current Medications: Current Outpatient Medications  Medication Sig Dispense Refill  . amphetamine-dextroamphetamine (ADDERALL) 20 MG tablet Take 1 tablet (20 mg total) by mouth 3 (three) times daily. 90 tablet 0  . [START ON 12/08/2020] amphetamine-dextroamphetamine (ADDERALL) 20 MG tablet Take 1 tablet (20 mg total) by mouth 3 (three) times daily. 90 tablet 0  . [START ON 01/07/2021] amphetamine-dextroamphetamine (ADDERALL) 20 MG tablet Take 1 tablet (20 mg total) by mouth 3 (three) times daily. 90 tablet 0  . ondansetron (ZOFRAN) 4 MG tablet  Take 1 tablet (4 mg total) by mouth 2 (two) times daily as needed for nausea or vomiting. 60 tablet 0  . albuterol (VENTOLIN HFA) 108 (90 Base) MCG/ACT inhaler Inhale 1-2 puffs into the lungs every 6 (six) hours as needed for wheezing or shortness of breath. 18 g 2  . busPIRone (BUSPAR) 15 MG tablet Take 1 tablet (15 mg total) by mouth 3 (three) times daily. 270 tablet 1  . diazepam (VALIUM) 10 MG tablet Take 1 tablet (10 mg total) by mouth 3 (three) times daily as needed for anxiety. 90 tablet 1  . escitalopram (LEXAPRO) 20 MG tablet Take 1 tablet (20 mg total) by mouth daily. 30 tablet 0  . famotidine (PEPCID) 40 MG tablet Take 1 tablet (40 mg total) by mouth at bedtime. 30 tablet 3  . pantoprazole (PROTONIX) 40 MG tablet Take 1 tablet (40 mg total) by mouth daily. 30 tablet 11  . terconazole (TERAZOL 7) 0.4 % vaginal cream Place 1 applicator vaginally at bedtime. 45 g 0   No current facility-administered medications for this visit.    Psychiatric Specialty Exam: Review of Systems  Gastrointestinal: Positive for nausea.  Psychiatric/Behavioral: Positive for decreased concentration. The patient is nervous/anxious.   All other systems reviewed and are negative.   There were no vitals taken for this visit.There is no height or weight on file to calculate  BMI.  General Appearance: NA  Eye Contact:  NA  Speech:  Clear and Coherent and Normal Rate  Volume:  Normal  Mood:  Irritable  Affect:  NA  Thought Process:  Goal Directed  Orientation:  Full (Time, Place, and Person)  Thought Content: Rumination   Suicidal Thoughts:  No  Homicidal Thoughts:  No  Memory:  Immediate;   Good Recent;   Good Remote;   Good  Judgement:  Good  Insight:  Fair  Psychomotor Activity:  NA  Concentration:  Concentration: Fair  Recall:  Good  Fund of Knowledge: Good  Language: Good  Akathisia:  Negative  Handed:  Right  AIMS (if indicated): not done  Assets:  Communication Skills Desire for Improvement Financial Resources/Insurance Housing Resilience Social Support  ADL's:  Intact  Cognition: WNL  Sleep:  Fair   Screenings: PHQ2-9   [place] Office Visit from 11/14/2019 in [place] at [facility] Visit from 01/31/2016 in [place] at [facility]  PHQ-2 Total Score 6 0  PHQ-9 Total Score 24 -       Assessment and Plan: 30 yo MAAF with mixed anxiety and adult ADD as substantiated by chronicproblems with concentration, task completion, forgetfulness consistent with inattentive form of ADHD. Javaeh has never been under care of psychiatry - Dr. Charlett Blake has started her on a combination of sertraline and buspirone in January this year to help with depression and anxiety. 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 She has been feeling overwhelmed, worrying, depressed, distractible, having racing thoughts, difficulty falling asleep and struggling with sustaining attention, procrastination, task completion, forgetfulness. She also hadpanic attacks which have since subsided (takes diazepam on as needed basis).Ishi reports that  buspirone has helped with anxiety whereas she does not see clear decrease in depression since starting sertraline (even after we increased it to 200 mg). She was briefly switched to fluoxetine 40 mg but  it boosted her appetite (she is obese) so it was stopped. She hasalsotriedcitaloprambrieflyprior to sertraline. We have stopped sertraline and started duloxetine but she reports having marked nausea ever since dose was increased to 60 mg.We then changed it to escitalopram which she tolerates welland her mood has since improved.In March she was started on methylphenidate 20 mg bid for adult ADHD with some benefit.We increased dose to30 mg and it seemed to work betterbut she was complaining of nausea.She now takes 20 mg twice to three times daily - back at school. She feels that this medication still does not help with focusing problems enough. She does complain of anxiety though - diazepam 5 mg is helpful "to some extent".  Dx: Adult ADHD; Mixed anxiety (GAD, panic disorder)  Plan: We will continue buspirone 15 mg tid,escitalopram 20 mg daily, and diazepam 10 mg tid prn anxiety/insomnia.We will change methylphenidate to Adderall same dose/schedule.Next visit in1 month.The plan was discussed with patient who had an opportunity to ask questions and these were all answered. I spend69minutes inphone consultationwith the patient.    Stephanie Acre, MD 11/08/2020, 8:42 AM

## 2020-11-12 ENCOUNTER — Telehealth (HOSPITAL_COMMUNITY): Payer: Self-pay | Admitting: *Deleted

## 2020-11-12 ENCOUNTER — Other Ambulatory Visit (HOSPITAL_COMMUNITY): Payer: Self-pay | Admitting: Psychiatry

## 2020-11-12 MED ORDER — ESCITALOPRAM OXALATE 20 MG PO TABS: 30.0000 mg | ORAL_TABLET | Freq: Every day | ORAL | 0 refills | Status: DC

## 2020-11-12 MED ORDER — METHYLPHENIDATE HCL 20 MG PO TABS: 20.0000 mg | ORAL_TABLET | Freq: Three times a day (TID) | ORAL | 0 refills | Status: DC

## 2020-11-12 NOTE — Telephone Encounter (Signed)
I increased Lexapro to 30 mg and reordered Ritalin 20 mg tid (she will likely need to wait 30 days since her refill on Adderall though).

## 2020-11-12 NOTE — Telephone Encounter (Signed)
Pt called c/o Adderall not working as well as the Ritalin and wanting to switch back to Ritalin. Pt also has c/o increased depression due to increased stress with her husband, and wants to know if you will "double" her Lexapro. Please advise.

## 2020-11-16 ENCOUNTER — Telehealth (HOSPITAL_COMMUNITY): Payer: Self-pay | Admitting: Psychiatry

## 2020-11-23 ENCOUNTER — Telehealth (HOSPITAL_COMMUNITY): Payer: Self-pay | Admitting: *Deleted

## 2020-11-23 ENCOUNTER — Other Ambulatory Visit (HOSPITAL_COMMUNITY): Payer: Self-pay | Admitting: Psychiatry

## 2020-11-23 MED ORDER — DIAZEPAM 10 MG PO TABS
10.0000 mg | ORAL_TABLET | Freq: Three times a day (TID) | ORAL | 1 refills | Status: DC | PRN
Start: 2020-11-23 — End: 2021-01-07

## 2020-11-23 MED ORDER — BUSPIRONE HCL 15 MG PO TABS
15.0000 mg | ORAL_TABLET | Freq: Three times a day (TID) | ORAL | 1 refills | Status: DC
Start: 2020-11-23 — End: 2021-02-25

## 2020-11-23 MED ORDER — ESCITALOPRAM OXALATE 20 MG PO TABS: 30.0000 mg | ORAL_TABLET | Freq: Every day | ORAL | 2 refills | Status: DC

## 2020-11-23 NOTE — Telephone Encounter (Signed)
Pt called asking for refills on Buspar, Valium, and Lexapro which was written on 11/12/20 but pt is taking 1.5 tablet qd. Please review and advise. Thanks.

## 2020-11-23 NOTE — Telephone Encounter (Signed)
Done

## 2020-11-26 ENCOUNTER — Encounter: Payer: 59 | Admitting: Family Medicine

## 2020-12-05 ENCOUNTER — Telehealth: Payer: 59 | Admitting: Emergency Medicine

## 2020-12-05 DIAGNOSIS — R112 Nausea with vomiting, unspecified: Secondary | ICD-10-CM | POA: Diagnosis not present

## 2020-12-05 MED ORDER — PROMETHAZINE HCL 25 MG PO TABS: 25.0000 mg | ORAL_TABLET | Freq: Four times a day (QID) | ORAL | 0 refills | Status: DC | PRN

## 2020-12-05 MED ORDER — ONDANSETRON 4 MG PO TBDP: 4.0000 mg | ORAL_TABLET | Freq: Three times a day (TID) | ORAL | 0 refills | Status: DC | PRN

## 2020-12-05 NOTE — Progress Notes (Signed)
We are sorry that you are not feeling well. Here is how we plan to help!  Based on what you have shared with me it looks like you have a Virus that is irritating your GI tract.  Vomiting is the forceful emptying of a portion of the stomach's content through the mouth.  Although nausea and vomiting can make you feel miserable, it's important to remember that these are not diseases, but rather symptoms of an underlying illness.  When we treat short term symptoms, we always caution that any symptoms that persist should be fully evaluated in a medical office.  I have prescribed a medication that will help alleviate your symptoms and allow you to stay hydrated:  Promethazine 25 mg take 1 tablet twice daily and Zofran 4mg  ODT.  Take one or the other, NOT both.  HOME CARE:  Drink clear liquids.  This is very important! Dehydration (the lack of fluid) can lead to a serious complication.  Start off with 1 tablespoon every 5 minutes for 8 hours.  You may begin eating bland foods after 8 hours without vomiting.  Start with saltine crackers, white bread, rice, mashed potatoes, applesauce.  After 48 hours on a bland diet, you may resume a normal diet.  Try to go to sleep.  Sleep often empties the stomach and relieves the need to vomit.  GET HELP RIGHT AWAY IF:   Your symptoms do not improve or worsen within 2 days after treatment.  You have a fever for over 3 days.  You cannot keep down fluids after trying the medication.  MAKE SURE YOU:   Understand these instructions.  Will watch your condition.  Will get help right away if you are not doing well or get worse.   Thank you for choosing an e-visit. Your e-visit answers were reviewed by a board certified advanced clinical practitioner to complete your personal care plan. Depending upon the condition, your plan could have included both over the counter or prescription medications. Please review your pharmacy choice. Be sure that the pharmacy  you have chosen is open so that you can pick up your prescription now.  If there is a problem you may message your provider in [place] to have the prescription routed to another pharmacy. Your safety is important to [place]. If you have drug allergies check your prescription carefully.  For the next 24 hours, you can use MyChart to ask questions about today's visit, request a non-urgent call back, or ask for a work or school excuse from your e-visit provider. You will get an e-mail in the next two days asking about your experience. I hope that your e-visit has been valuable and will speed your recovery.   Approximately 5 minutes was used in reviewing the patient's chart, questionnaire, prescribing medications, and documentation.

## 2020-12-28 ENCOUNTER — Other Ambulatory Visit: Payer: Self-pay

## 2020-12-28 ENCOUNTER — Encounter (HOSPITAL_BASED_OUTPATIENT_CLINIC_OR_DEPARTMENT_OTHER): Payer: Self-pay | Admitting: Emergency Medicine

## 2020-12-28 ENCOUNTER — Emergency Department (HOSPITAL_BASED_OUTPATIENT_CLINIC_OR_DEPARTMENT_OTHER)
Admission: EM | Admit: 2020-12-28 | Discharge: 2020-12-28 | Disposition: A | Discharge status: Home / Self Care | Payer: 59 | Attending: Emergency Medicine | Admitting: Emergency Medicine

## 2020-12-28 DIAGNOSIS — R202 Paresthesia of skin: Secondary | ICD-10-CM | POA: Diagnosis not present

## 2020-12-28 DIAGNOSIS — F419 Anxiety disorder, unspecified: Secondary | ICD-10-CM | POA: Diagnosis not present

## 2020-12-28 DIAGNOSIS — Z8541 Personal history of malignant neoplasm of cervix uteri: Secondary | ICD-10-CM | POA: Diagnosis not present

## 2020-12-28 DIAGNOSIS — Z8616 Personal history of COVID-19: Secondary | ICD-10-CM | POA: Insufficient documentation

## 2020-12-28 DIAGNOSIS — J45909 Unspecified asthma, uncomplicated: Secondary | ICD-10-CM | POA: Insufficient documentation

## 2020-12-28 DIAGNOSIS — R000 Tachycardia, unspecified: Secondary | ICD-10-CM | POA: Insufficient documentation

## 2020-12-28 LAB — CBC WITH DIFFERENTIAL/PLATELET
Abs Immature Granulocytes: 0.02 10*3/uL (ref 0.00–0.07)
Basophils Absolute: 0.1 10*3/uL (ref 0.0–0.1)
Basophils Relative: 1 %
Eosinophils Absolute: 0.1 10*3/uL (ref 0.0–0.5)
Eosinophils Relative: 2 %
HCT: 41.9 % (ref 36.0–46.0)
Hemoglobin: 13.3 g/dL (ref 12.0–15.0)
Immature Granulocytes: 0 %
Lymphocytes Relative: 44 %
Lymphs Abs: 3.7 10*3/uL (ref 0.7–4.0)
MCH: 25.2 pg — ABNORMAL LOW (ref 26.0–34.0)
MCHC: 31.7 g/dL (ref 30.0–36.0)
MCV: 79.5 fL — ABNORMAL LOW (ref 80.0–100.0)
Monocytes Absolute: 0.6 10*3/uL (ref 0.1–1.0)
Monocytes Relative: 7 %
Neutro Abs: 3.9 10*3/uL (ref 1.7–7.7)
Neutrophils Relative %: 46 %
Platelets: 435 10*3/uL — ABNORMAL HIGH (ref 150–400)
RBC: 5.27 MIL/uL — ABNORMAL HIGH (ref 3.87–5.11)
RDW: 17.8 % — ABNORMAL HIGH (ref 11.5–15.5)
WBC: 8.3 10*3/uL (ref 4.0–10.5)
nRBC: 0 % (ref 0.0–0.2)

## 2020-12-28 LAB — BASIC METABOLIC PANEL
Anion gap: 12 (ref 5–15)
BUN: 15 mg/dL (ref 6–20)
CO2: 26 mmol/L (ref 22–32)
Calcium: 8.7 mg/dL — ABNORMAL LOW (ref 8.9–10.3)
Chloride: 99 mmol/L (ref 98–111)
Creatinine, Ser: 0.72 mg/dL (ref 0.44–1.00)
GFR, Estimated: >60 mL/min (ref >60)
Glucose, Bld: 87 mg/dL (ref 70–99)
Potassium: 3.4 mmol/L — ABNORMAL LOW (ref 3.5–5.1)
Sodium: 137 mmol/L (ref 135–145)

## 2020-12-28 MED ORDER — KETOROLAC TROMETHAMINE 30 MG/ML IJ SOLN
30.0000 mg | Freq: Once | INTRAMUSCULAR | Status: AC
  Administered 2020-12-28: 30 mg via INTRAVENOUS
  Filled 2020-12-28: qty 1

## 2020-12-28 MED ORDER — SODIUM CHLORIDE 0.9 % IV SOLN: 1000.0000 mL | INTRAVENOUS | Status: DC

## 2020-12-28 MED ORDER — SODIUM CHLORIDE 0.9 % IV BOLUS (SEPSIS)
1000.0000 mL | Freq: Once | INTRAVENOUS | Status: AC
  Administered 2020-12-28: 1000 mL via INTRAVENOUS

## 2020-12-28 NOTE — ED Notes (Signed)
Unsuccessful IV attempt x2.  

## 2020-12-28 NOTE — ED Triage Notes (Signed)
Pt presents to ED POV. Pt c/o swelling and paraesthesias that began this week. Pt reports that has not been able to fit clothes, shoes, or wedding ring since Tuesday. Pt's HR - 132 in triage reports having tachycardisince having covid in 2020

## 2020-12-28 NOTE — Discharge Instructions (Addendum)
Drink plenty of fluids,  Elevate your feet.  See your Physicain for recheck next week
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
## 2021-01-02 ENCOUNTER — Other Ambulatory Visit: Payer: Self-pay

## 2021-01-02 ENCOUNTER — Telehealth (INDEPENDENT_AMBULATORY_CARE_PROVIDER_SITE_OTHER): Payer: 59 | Admitting: Psychiatry

## 2021-01-02 ENCOUNTER — Encounter (HOSPITAL_COMMUNITY): Payer: Self-pay | Admitting: Psychiatry

## 2021-01-02 DIAGNOSIS — F320 Major depressive disorder, single episode, mild: Secondary | ICD-10-CM

## 2021-01-02 DIAGNOSIS — F411 Generalized anxiety disorder: Secondary | ICD-10-CM | POA: Diagnosis not present

## 2021-01-02 NOTE — Progress Notes (Addendum)
[place] MD/PA/NP OP Progress Note  01/02/2021 11:35 AM Melissa [name]  MRN:  [id number] Interview was conducted by phone and I verified that I was speaking with the correct person using two identifiers. I discussed the limitations of evaluation and management by telemedicine and  the availability of in person appointments. Patient expressed understanding and agreed to proceed. Participants in the visit: patient (location - home); physician (location - home office).  Chief Complaint: stress  YQM:VHQIONG is a 30 yo MAAF with mixed anxiety and adult ADD as substantiated by chronicproblems with concentration, task completion, forgetfulness consistent with inattentive form of ADHD. Patient was previously a patient of Dr.Pucilowska, who is no longer with [facility]. Patient today reports she has not been well last week. States she feels everything is chaotic. She has 2 small children, is in nursing school and has a husband with an alcohol problem. She was at the ED last week with swelling in her hands and feet along with tingling. She had told the ED staff that she was not taking her psychiatric medications. She told this clinician today that she is intermittently taking the diazepam, but then says she takes the Ritalin daily. She then stated she does not properly recall how often she takes the diazepam . Reports taking the buspar regularly, states this is helpful. Not sure about the lexapro which she says she is taking 30mg  (the prescription is accurate). We discussed the highest dose being 20mg  and if she is ok with decreasing to the 20mg . Patient is agreeable.  She also reports she needs to take the diazepam when she takes the ritalin because it causes anxiety. States her life is stressful. Not seeing a therapist, she says it has not been helpful in the past. She denies any suicidal thoughts. She denies any alcohol or substance abuse.  Per Dr.Pucilowska's last note and visit with patient, " Melissa [name] has never  been under care of psychiatry - Dr. Charlett Blake has started her on a combination of 32rtraline and buspirone in January this year to help with depression and anxiety. 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 She has been feeling overwhelmed, worrying, depressed, distractible, having racing thoughts, difficulty falling asleep and struggling with sustaining attention, procrastination, task completion, forgetfulness. She also hadpanic attacks which have since subsided (takes diazepam on as needed basis).Melissa [name] reports that buspirone has helped with anxiety whereas she does not see clear decrease in depression since starting sertraline (even after we increased it to 32 mg). She was briefly switched to fluoxetine 40 mg but it boosted her appetite (she is obese) so it was stopped. She hasalsotriedcitaloprambrieflyprior to sertraline. We have stopped sertraline and started duloxetine but she reports having marked nausea ever since dose was increased to 60 mg.We then changed it to escitalopram which she tolerates welland her mood has since improved.In March she was started on methylphenidate 20 mg bid for adult ADHD with some benefit.We increased dose to30 mg and it seemed to work betterbut she was complaining of nausea.She now takes 20 mg twice to three times daily - back at school. She feels that this medication still does not help with focusing problems enough. She does complain of anxiety though - diazepam 5 mg is helpful "to some extent".     Visit Diagnosis:    ICD-10-CM   1. GAD (generalized anxiety disorder)  F41.1   2. Current mild episode of major depressive disorder, unspecified whether recurrent ([place])  F32.0     Past Psychiatric History: Please see intake H&P.  Past Medical History:  Past Medical History:  Diagnosis Date  .  Abnormal Pap smear of cervix 07/11/2011  . Acid reflux   . Anxiety   . Asthma    activity induced  . Irregular menses   . Polycystic ovarian syndrome     Past Surgical History:  Procedure Laterality Date  . NO PAST SURGERIES      Family Psychiatric History: Reviewed.  Family History:  Family History  Problem Relation Age of Onset  . Gallbladder disease Mother   . Colon polyps Mother   . Hypertension Father   . Other Father        Hepatitis A  . Diabetes Father   . Drug abuse Father   . Post-traumatic stress disorder Father   . Stomach cancer Maternal Grandmother   . Diabetes Maternal Grandmother   . Colon polyps Maternal Grandmother   . Colon cancer Maternal Grandmother   . Cancer Maternal Grandmother        stomach cancer  . Alcohol abuse Maternal Grandmother   . Emphysema Maternal Grandfather   . Cancer Maternal Grandfather        smoke, lung cancer  . Alcohol abuse Maternal Grandfather   . Heart disease Paternal Grandfather   . Alcohol abuse Paternal Grandfather   . Breast cancer Other        mat great aunts x 2  . Colon cancer Other        mat great uncles x 2  . Irritable bowel syndrome Sister   . Multiple sclerosis Sister   . Bipolar disorder Sister   . Colon polyps Paternal Uncle   . Alcohol abuse Paternal Grandmother   . Pancreatic cancer Neg Hx   . Esophageal cancer Neg Hx     Social History:  Social History   Socioeconomic History  . Marital status: Married    Spouse name: Not on file  . Number of children: 0  . Years of education: Not on file  . Highest education level: Not on file  Occupational History  . Occupation: Lexicographer: [employer]   Tobacco Use  . Smoking status: Never Smoker  . Smokeless tobacco: Never Used  Vaping Use  . Vaping Use: Never used  Substance and Sexual Activity  . Alcohol use: Not Currently    Alcohol/week: 4.0 - 7.0 standard drinks    Types: 4 - 7 Cans of beer per week    Comment: 1 beer every  other ay  . Drug use: Not Currently    Types: Marijuana  . Sexual activity: Yes    Partners: Male    Birth control/protection: None  Other Topics Concern  . Not on file  Social History Narrative   Caffeine use:  1 can soda daily   Regular exercise:  Just started   2 sisters.      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    Social Determinants of Health   Financial Resource Strain: Not on file  Food Insecurity: Not on file  Transportation Needs: Not on file  Physical Activity: Not on file  Stress: Not on file  Social Connections: Not on file    Allergies:  Allergies  Allergen Reactions  . Benadryl [Diphenhydramine Hcl] Other (See Comments)    Heart races    Metabolic Disorder Labs: Lab Results  Component Value Date   HGBA1C 6.0 11/15/2019   No results found for: PROLACTIN Lab Results  Component Value Date   CHOL 208 (H) 11/14/2019   TRIG 100.0 11/14/2019   HDL 48.80 11/14/2019   CHOLHDL 4 11/14/2019   VLDL 20.0 11/14/2019   LDLCALC 139 (H) 11/14/2019   LDLCALC 116 (H) 02/09/2017   Lab Results  Component Value Date   TSH 1.39 11/14/2019   TSH 1.78 02/09/2017    Therapeutic Level Labs: No results found for: LITHIUM No results found for: VALPROATE No components found for:  CBMZ  Current Medications: Current Outpatient Medications  Medication Sig Dispense Refill  . methylphenidate (RITALIN) 20 MG tablet Take 1 tablet (20 mg total) by mouth 3 (three) times daily with meals. 90 tablet 0  . [START ON 01/11/2021] methylphenidate (RITALIN) 20 MG tablet Take 1 tablet (20 mg total) by mouth 3 (three) times daily with meals. 90 tablet 0  . albuterol (VENTOLIN HFA) 108 (90 Base) MCG/ACT inhaler Inhale 1-2 puffs into the lungs every 6 (six) hours as needed for wheezing or shortness of breath. 18 g 2  . busPIRone (BUSPAR)  15 MG tablet Take 1 tablet (15 mg total) by mouth 3 (three) times daily. 270 tablet 1  . diazepam (VALIUM) 10 MG tablet Take 1 tablet (10 mg total) by mouth 3 (three) times daily as needed for anxiety. 90 tablet 1  . escitalopram (LEXAPRO) 20 MG tablet Take 1.5 tablets (30 mg total) by mouth daily. 45 tablet 2  . famotidine (PEPCID) 40 MG tablet Take 1 tablet (40 mg total) by mouth at bedtime. 30 tablet 3  . methylphenidate (RITALIN) 20 MG tablet Take 1 tablet (20 mg total) by mouth 3 (three) times daily with meals. 90 tablet 0  . ondansetron (ZOFRAN ODT) 4 MG disintegrating tablet Take 1 tablet (4 mg total) by mouth every 8 (eight) hours as needed for nausea or vomiting. 10 tablet 0  . ondansetron (ZOFRAN) 4 MG tablet Take 1 tablet (4 mg total) by mouth 2 (two) times daily as needed for nausea or vomiting. 60 tablet 0  . pantoprazole (PROTONIX) 40 MG tablet Take 1 tablet (40 mg total) by mouth daily. 30 tablet 11  . promethazine (PHENERGAN) 25 MG tablet Take 1 tablet (25 mg total) by mouth every 6 (six) hours as needed for nausea or vomiting. 12 tablet 0  . terconazole (TERAZOL 7) 0.4 % vaginal cream Place 1 applicator vaginally at bedtime. 45 g 0   No current facility-administered medications for this visit.    Psychiatric Specialty Exam: Review of Systems  Gastrointestinal: Positive for nausea.  Psychiatric/Behavioral: Positive for decreased concentration. The patient is nervous/anxious.   All other systems reviewed and are negative.   There were no vitals taken for this visit.There is no height or weight on file to calculate BMI.  General Appearance: NA  Eye Contact:  NA  Speech:  Clear and Coherent and Normal Rate  Volume:  Normal  Mood:  anxious  Affect:  NA  Thought Process:  Goal Directed  Orientation:  Full (Time, Place, and Person)  Thought Content: Rumination   Suicidal Thoughts:  No  Homicidal Thoughts:  No  Memory:  Immediate;   Good Recent;   Good Remote;   Good  Judgement:  Good  Insight:  Fair  Psychomotor Activity:  NA  Concentration:  Concentration: Fair  Recall:  Good  Fund of Knowledge: Good  Language: Good  Akathisia:  Negative  Handed:  Right  AIMS (if indicated): not done  Assets:  Communication Skills Desire for Improvement Financial Resources/Insurance Housing Resilience Social Support  ADL's:  Intact  Cognition: WNL  Sleep:  ok   Screenings: Doctor, general practice Office Visit from 11/14/2019 in [place] at [facility] Visit from 01/31/2016 in [place] at [facility]  PHQ-2 Total Score 6 0  PHQ-9 Total Score 24 --    [facility] ED from 12/28/2020 in [place] CATEGORY No Risk       Assessment and Plan: 30 yo MAAF with mixed anxiety and adult ADD as substantiated by chronicproblems with concentration, task completion, forgetfulness consistent with inattentive form of ADHD. Melissa [name] has never been under care of psychiatry - Dr. Charlett Blake has started her on a combination of 32rtraline and buspirone in January this year to help with depression and anxiety. 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 She has been feeling overwhelmed, worrying, depressed, distractible, having racing thoughts, difficulty falling asleep and struggling with sustaining attention, procrastination, task completion, forgetfulness. She also hadpanic attacks which have since subsided (takes diazepam on as needed basis).Melissa [name] reports that buspirone has helped with anxiety whereas she does not see clear decrease in depression since starting sertraline (even after we increased it to 32 mg). She was briefly switched to fluoxetine 40 mg but it boosted her appetite (she is obese) so it was stopped. She  hasalsotriedcitaloprambrieflyprior to sertraline. We have stopped sertraline and started duloxetine but she reports having marked nausea ever since dose was increased to 60 mg.We then changed it to escitalopram which she tolerates welland her mood has since improved.In March she was started on methylphenidate 20 mg bid for adult ADHD with some benefit.We increased dose to30 mg and it seemed to work betterbut she was complaining of nausea.She now takes 20 mg twice to three times daily - back at school. She feels that this medication still does not help with focusing problems enough. She does complain of anxiety though - diazepam 5 mg is helpful "to some extent".  Dx: Adult ADHD; Mixed anxiety (GAD, panic disorder)  Plan: Continue buspirone 15 mg tid,decrease escitalopram from 30 to 20 mg daily, and decrease diazepam 5mg  from 10mg  mg tid prn anxiety/insomnia. Continue ritalin at 20mg  three times daily, recommend she decrease to two times daily to reduce side effects. Recommend starting therapy again to help with coping with stressors. Next visit in1 month.The plan was discussed with patient who had an opportunity to ask questions and these were all answered. I spend41minutes inphone consultationwith the patient. I spent 20 minutes on chart review.   Addendum- Patient called back to talk about her EKG results which this clinician had mentioned earlier. She states she reviewed her chart and saw that her EKG results showed a right atrial enlargement. She is now open to making medication changes. Discussed stopping any stimulant medications until she has a cardiology appointment an dis cleared. Patient agreeable to this plan.    Elvin So, MD 01/02/2021, 11:35 AM

## 2021-01-03 ENCOUNTER — Encounter: Payer: Self-pay | Admitting: Family Medicine

## 2021-01-03 ENCOUNTER — Telehealth: Payer: Self-pay | Admitting: Family Medicine

## 2021-01-03 DIAGNOSIS — R200 Anesthesia of skin: Secondary | ICD-10-CM

## 2021-01-03 DIAGNOSIS — Z111 Encounter for screening for respiratory tuberculosis: Secondary | ICD-10-CM

## 2021-01-03 NOTE — Telephone Encounter (Signed)
Pt,called she would like a PPD testing she also would like a referral for a cardiology  Please advice

## 2021-01-03 NOTE — Telephone Encounter (Signed)
Pt would like a PPD test and a referral to cardiology placed. She will be seeing you Monday but was wondering if she could get the referral for cardiology put in ASAP.

## 2021-01-04 ENCOUNTER — Telehealth: Payer: 59 | Admitting: Family

## 2021-01-04 ENCOUNTER — Other Ambulatory Visit: Payer: Self-pay | Admitting: Family Medicine

## 2021-01-04 DIAGNOSIS — R000 Tachycardia, unspecified: Secondary | ICD-10-CM

## 2021-01-04 DIAGNOSIS — R002 Palpitations: Secondary | ICD-10-CM

## 2021-01-04 DIAGNOSIS — I517 Cardiomegaly: Secondary | ICD-10-CM

## 2021-01-04 NOTE — Telephone Encounter (Signed)
See mychart.  

## 2021-01-04 NOTE — Telephone Encounter (Signed)
I spoke with pt and advised her to come for lab visit on Tuesday as scheduled for TB Gold test and to reschedule her follow up visit at her convenience.

## 2021-01-04 NOTE — Addendum Note (Signed)
Addended by: Debbrah Alar on: 01/04/2021 04:06 PM   Modules accepted: Orders

## 2021-01-04 NOTE — Telephone Encounter (Signed)
Patient called to cancel appt for Monday due to her appt at [facility]. However she still wants the PPD test which has been scheduled for Tuesday. If possible please place orders for test for lab. Thanks

## 2021-01-07 ENCOUNTER — Encounter: Payer: Self-pay | Admitting: Internal Medicine

## 2021-01-07 ENCOUNTER — Other Ambulatory Visit: Payer: Self-pay

## 2021-01-07 ENCOUNTER — Ambulatory Visit (INDEPENDENT_AMBULATORY_CARE_PROVIDER_SITE_OTHER): Payer: 59 | Admitting: Internal Medicine

## 2021-01-07 ENCOUNTER — Telehealth: Payer: 59 | Admitting: Family

## 2021-01-07 VITALS — BP 110/80 | HR 109 | Ht 62.0 in | Wt 232.4 lb

## 2021-01-07 DIAGNOSIS — R000 Tachycardia, unspecified: Secondary | ICD-10-CM | POA: Diagnosis not present

## 2021-01-07 DIAGNOSIS — M79602 Pain in left arm: Secondary | ICD-10-CM

## 2021-01-07 NOTE — Patient Instructions (Signed)
Medication Instructions:  Your physician recommends that you continue on your current medications as directed. Please refer to the Current Medication list given to you today.  *If you need a refill on your cardiac medications before your next appointment, please call your pharmacy*   Lab Work: NONE If you have labs (blood work) drawn today and your tests are completely normal, you will receive your results only by: Marland Kitchen MyChart Message (if you have MyChart) OR . A paper copy in the mail If you have any lab test that is abnormal or we need to change your treatment, we will call you to review the results.   Testing/Procedures: Your physician has requested that you have an echocardiogram. Echocardiography is a painless test that uses sound waves to create images of your heart. It provides your doctor with information about the size and shape of your heart and how well your heart's chambers and valves are working. This procedure takes approximately one hour. There are no restrictions for this procedure. -- [address]   Follow-Up: At [facility], you and your health needs are our priority.  As part of our continuing mission to provide you with exceptional heart care, we have created designated Provider Care Teams.  These Care Teams include your primary Cardiologist (physician) and Advanced Practice Providers (APPs -  Physician Assistants and Nurse Practitioners) who all work together to provide you with the care you need, when you need it.  We recommend signing up for the patient portal called "MyChart".  Sign up information is provided on this After Visit Summary.  MyChart is used to connect with patients for Virtual Visits (Telemedicine).  Patients are able to view lab/test results, encounter notes, upcoming appointments, etc.  Non-urgent messages can be sent to your provider as well.   To learn more about what you can do with MyChart, go to [email or web address].    Your  next appointment:   After your echocardiogram is completed  The format for your next appointment:   In Person  Provider:   You may see Dr. Debara Pickett or one of the following Advanced Practice Providers on your designated Care Team:    Almyra Deforest, PA-C  Fabian Sharp, [place] or   Roby Lofts, [place]    Other Instructions

## 2021-01-07 NOTE — Progress Notes (Signed)
OFFICE CONSULT NOTE  Chief Complaint:  Left arm pain, tachycardia  Primary Care Physician: [name], MD  HPI:  Melissa [name] is a 30 y.o. female who is being seen today for the evaluation of left arm pain, tachycardia at the request of Debbrah Alar, NP. This is a 30 year old female kindly referred by her PCP at the request of her therapist.  She has a history of anxiety, PCOS and asthma but recently had COVID-19 in 2021.  As part of that work-up she had CT angiography which was also to follow-up and abnormal chest x-ray suggesting possible cardiomegaly in the setting of low lung volumes.  The CT demonstrated findings consistent with a COVID-pneumonia but no coronary calcification or aortic atherosclerosis.  Recently she was in the ER a couple times because of upper arm pain, swelling, numbness and tingling in her hands and particularly left arm pain.  She had really denied any chest pain to me today.  She noted that her therapist was concerned that her CK will have been elevated but just barely over 200.  She reports family history of fibromyalgia and MS in her first-degree relatives.  EKG today shows a sinus tachycardia at 109.  PMHx:  Past Medical History:  Diagnosis Date  . Abnormal Pap smear of cervix 07/11/2011  . Acid reflux   . Anxiety   . Asthma    activity induced  . Irregular menses   . Polycystic ovarian syndrome     Past Surgical History:  Procedure Laterality Date  . NO PAST SURGERIES      FAMHx:  Family History  Problem Relation Age of Onset  . Gallbladder disease Mother   . Colon polyps Mother   . Hypertension Father   . Other Father        Hepatitis A  . Diabetes Father   . Drug abuse Father   . Post-traumatic stress disorder Father   . Stomach cancer Maternal Grandmother   . Diabetes Maternal Grandmother   . Colon polyps Maternal Grandmother   . Colon cancer Maternal Grandmother   . Cancer Maternal Grandmother        stomach cancer  .  Alcohol abuse Maternal Grandmother   . Emphysema Maternal Grandfather   . Cancer Maternal Grandfather        smoke, lung cancer  . Alcohol abuse Maternal Grandfather   . Heart disease Paternal Grandfather   . Alcohol abuse Paternal Grandfather   . Breast cancer Other        mat great aunts x 2  . Colon cancer Other        mat great uncles x 2  . Irritable bowel syndrome Sister   . Multiple sclerosis Sister   . Bipolar disorder Sister   . Colon polyps Paternal Uncle   . Alcohol abuse Paternal Grandmother   . Pancreatic cancer Neg Hx   . Esophageal cancer Neg Hx     SOCHx:   reports that she has never smoked. She has never used smokeless tobacco. She reports previous alcohol use of about 4.0 - 7.0 standard drinks of alcohol per week. She reports previous drug use. Drug: Marijuana.  ALLERGIES:  Allergies  Allergen Reactions  . Benadryl [Diphenhydramine Hcl] Other (See Comments)    Heart races    ROS: Pertinent items noted in HPI and remainder of comprehensive ROS otherwise negative.  HOME MEDS: Current Outpatient Medications on File Prior to Visit  Medication Sig Dispense Refill  . albuterol (VENTOLIN HFA)  108 (90 Base) MCG/ACT inhaler Inhale 1-2 puffs into the lungs every 6 (six) hours as needed for wheezing or shortness of breath. 18 g 2  . busPIRone (BUSPAR) 15 MG tablet Take 1 tablet (15 mg total) by mouth 3 (three) times daily. 270 tablet 1  . [START ON 01/11/2021] methylphenidate (RITALIN) 20 MG tablet Take 1 tablet (20 mg total) by mouth 3 (three) times daily with meals. 90 tablet 0   No current facility-administered medications on file prior to visit.    LABS/IMAGING: No results found for this or any previous visit (from the past 48 hour(s)). No results found.  LIPID PANEL:    Component Value Date/Time   CHOL 208 (H) 11/14/2019 1423   TRIG 100.0 11/14/2019 1423   HDL 48.80 11/14/2019 1423   CHOLHDL 4 11/14/2019 1423   VLDL 20.0 11/14/2019 1423   LDLCALC 139  (H) 11/14/2019 1423    WEIGHTS: Wt Readings from Last 3 Encounters:  01/07/21 232 lb 6.4 oz (105.4 kg)  12/16/19 227 lb (103 kg)  11/14/19 241 lb 12.8 oz (109.7 kg)    VITALS: BP 110/80   Pulse (!) 109   Ht 5\' 2"  (1.575 m)   Wt 232 lb 6.4 oz (105.4 kg)   BMI 42.51 kg/m   EXAM: General appearance: alert and no distress Neck: no carotid bruit, no JVD and thyroid not enlarged, symmetric, no tenderness/mass/nodules Lungs: clear to auscultation bilaterally Heart: regular rate and rhythm, S1, S2 normal, no murmur, click, rub or gallop Abdomen: soft, non-tender; bowel sounds normal; no masses,  no organomegaly Extremities: extremities normal, atraumatic, no cyanosis or edema Pulses: 2+ and symmetric Skin: Skin color, texture, turgor normal. No rashes or lesions Neurologic: Grossly normal Psych: Pleasant  EKG: Sinus tachycardia at 109- personally reviewed  ASSESSMENT: 1. Unexplained sinus tachycardia 2. Left arm pain  PLAN: 1.   Ms. Montijo is an unexplained sinus tachycardia.  She denies any true chest pain but does describe some left arm pain which sounds like a musculoskeletal or neuropathic pain.  Her tachycardia is unexplained but could be related to some of her medications.  She said it was preexistent prior to being on methylphenidate and buspirone.  Recently she stopped those medications but her therapist had put her on lower doses.  I would like to get an echocardiogram to rule out any cardiomyopathy although I think that is unlikely.  If this is reassuring and the fact that she had a CT scan last year that showed no coronary calcification given her young age I think coronary disease is unlikely.  She also does not describe any concomitant chest pain associated with her left arm discomfort.  As to the cause of her symptoms it might be related to musculoskeletal pain or perhaps a fibromyalgia as it seems to run in her family.  Thanks again for the kind referral.  We will  follow-up with her after her echo.  Pixie Casino, MD, [facility], [place] Director of the Advanced Lipid Disorders &  Cardiovascular Risk Reduction Clinic Diplomate of the American Board of Clinical Lipidology Attending Cardiologist  Direct Dial: [phone]  Fax: [phone]  Website:  [email or web address] Corbyn Wildey 01/07/2021, 1:50 PM

## 2021-01-08 ENCOUNTER — Other Ambulatory Visit: Payer: 59

## 2021-01-08 ENCOUNTER — Ambulatory Visit: Payer: 59

## 2021-01-09 ENCOUNTER — Other Ambulatory Visit: Payer: Self-pay

## 2021-01-09 ENCOUNTER — Telehealth: Payer: 59 | Admitting: Family

## 2021-01-09 ENCOUNTER — Other Ambulatory Visit (INDEPENDENT_AMBULATORY_CARE_PROVIDER_SITE_OTHER): Payer: 59

## 2021-01-09 DIAGNOSIS — Z111 Encounter for screening for respiratory tuberculosis: Secondary | ICD-10-CM | POA: Diagnosis not present

## 2021-01-11 ENCOUNTER — Telehealth (INDEPENDENT_AMBULATORY_CARE_PROVIDER_SITE_OTHER): Payer: 59 | Admitting: Family

## 2021-01-11 ENCOUNTER — Other Ambulatory Visit: Payer: Self-pay

## 2021-01-11 DIAGNOSIS — M7918 Myalgia, other site: Secondary | ICD-10-CM

## 2021-01-11 LAB — QUANTIFERON-TB GOLD PLUS
Mitogen-NIL: >10 IU/mL
NIL: 0.02 IU/mL
QuantiFERON-TB Gold Plus: NEGATIVE
TB1-NIL: 0.01 IU/mL
TB2-NIL: 0.01 IU/mL

## 2021-01-11 MED ORDER — MELOXICAM 7.5 MG PO TABS: 7.5000 mg | ORAL_TABLET | Freq: Every day | ORAL | 0 refills | Status: DC
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
## 2021-01-11 NOTE — Assessment & Plan Note (Signed)
New. Suspect musculoskeletal pain. Will give trial of meloxicam 7.5mg  once daily. Pt is advised to call if symptoms worsen or if symptoms fail to improve. Follow up with PCP in 2 weeks.

## 2021-01-21 ENCOUNTER — Telehealth (HOSPITAL_COMMUNITY): Payer: Self-pay | Admitting: Psychiatry

## 2021-01-21 ENCOUNTER — Telehealth (HOSPITAL_COMMUNITY): Payer: Self-pay | Admitting: *Deleted

## 2021-01-21 MED ORDER — METHYLPHENIDATE HCL 20 MG PO TABS: 20.0000 mg | ORAL_TABLET | Freq: Three times a day (TID) | ORAL | 0 refills | Status: DC

## 2021-01-21 NOTE — Telephone Encounter (Signed)
Pharmacy told me that a "new script" must be sent. They will not fill the one dated 01/11/21.

## 2021-01-21 NOTE — Telephone Encounter (Signed)
If there is prescription from 5/13 than she does not need a new one until 6/13. She can call us than to get a final 30 day refill.

## 2021-01-21 NOTE — Telephone Encounter (Signed)
Pt called multiple times regarding Ritalin refill. Epic shows one fill start date 5.13.22 by Dr. Montel Culver. Writer spoke with [facility] and was told that a new Rx needs to be sent in. Writer informed pt that Dr. Einar Grad last day 01/29/21 and office will be sending letter with outside provider information. 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 Please review and advise. Thanks.

## 2021-01-21 NOTE — Telephone Encounter (Signed)
Review chart. Seen by Dr Montel Culver who retired and recently saw Dr Einar Grad. Will need a 30 days supply of Ritalin until established care with new provider. Refilled called.

## 2021-01-31 ENCOUNTER — Telehealth (HOSPITAL_COMMUNITY): Payer: 59 | Admitting: Psychiatry

## 2021-02-01 ENCOUNTER — Other Ambulatory Visit (HOSPITAL_COMMUNITY): Payer: 59

## 2021-02-01 ENCOUNTER — Encounter (HOSPITAL_COMMUNITY): Payer: Self-pay | Admitting: Internal Medicine

## 2021-02-03 ENCOUNTER — Other Ambulatory Visit: Payer: Self-pay | Admitting: Family

## 2021-02-07 ENCOUNTER — Ambulatory Visit (INDEPENDENT_AMBULATORY_CARE_PROVIDER_SITE_OTHER): Payer: 59 | Admitting: Family Medicine

## 2021-02-07 ENCOUNTER — Other Ambulatory Visit: Payer: Self-pay

## 2021-02-07 DIAGNOSIS — R000 Tachycardia, unspecified: Secondary | ICD-10-CM | POA: Diagnosis not present

## 2021-02-07 DIAGNOSIS — M62838 Other muscle spasm: Secondary | ICD-10-CM | POA: Diagnosis not present

## 2021-02-07 DIAGNOSIS — M7918 Myalgia, other site: Secondary | ICD-10-CM

## 2021-02-07 DIAGNOSIS — N921 Excessive and frequent menstruation with irregular cycle: Secondary | ICD-10-CM

## 2021-02-07 DIAGNOSIS — K2960 Other gastritis without bleeding: Secondary | ICD-10-CM

## 2021-02-07 DIAGNOSIS — E559 Vitamin D deficiency, unspecified: Secondary | ICD-10-CM

## 2021-02-07 DIAGNOSIS — R748 Abnormal levels of other serum enzymes: Secondary | ICD-10-CM | POA: Diagnosis not present

## 2021-02-07 DIAGNOSIS — R0789 Other chest pain: Secondary | ICD-10-CM

## 2021-02-07 DIAGNOSIS — F320 Major depressive disorder, single episode, mild: Secondary | ICD-10-CM

## 2021-02-07 MED ORDER — TIZANIDINE HCL 2 MG PO TABS: 1.0000 mg | ORAL_TABLET | Freq: Three times a day (TID) | ORAL | 2 refills | Status: DC | PRN

## 2021-02-07 MED ORDER — VENLAFAXINE HCL ER 75 MG PO CP24: 75.0000 mg | ORAL_CAPSULE | Freq: Every day | ORAL | 2 refills | Status: DC

## 2021-02-07 MED ORDER — VENLAFAXINE HCL ER 37.5 MG PO CP24: 37.5000 mg | ORAL_CAPSULE | Freq: Every day | ORAL | 0 refills | Status: DC
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
## 2021-02-07 NOTE — Patient Instructions (Signed)
Nonspecific Chest Pain, Adult Chest pain can be caused by many different conditions. It can be caused by a condition that is life-threatening and requires treatment right away. It can also be caused by something that is not life-threatening. If you have chest pain, it can be hard to know the difference, so it is important to get helpright away to make sure that you do not have a serious condition. Some life-threatening causes of chest pain include: Heart attack. A tear in the body's main blood vessel (aortic dissection). Inflammation around your heart (pericarditis). A problem in the lungs, such as a blood clot (pulmonary embolism) or a collapsed lung (pneumothorax). Some non life-threatening causes of chest pain include: Heartburn. Anxiety or stress. Damage to the bones, muscles, and cartilage that make up your chest wall. Pneumonia or bronchitis. Shingles infection (varicella-zoster virus). Chest pain can feel like: Pain or discomfort on the surface of your chest or deep in your chest. Crushing, pressure, aching, or squeezing pain. Burning or tingling. Dull or sharp pain that is worse when you move, cough, or take a deep breath. Pain or discomfort that is also felt in your back, neck, jaw, shoulder, or arm, or pain that spreads to any of these areas. Your chest pain may come and go. It may also be constant. Your health care provider will do lab tests and other studies to find the cause of your pain.Treatment will depend on the cause of your chest pain. Follow these instructions at home: Medicines Take over-the-counter and prescription medicines only as told by your health care provider. If you were prescribed an antibiotic, take it as told by your health care provider. Do not stop taking the antibiotic even if you start to feel better. Lifestyle  Rest as directed by your health care provider. Do not use any products that contain nicotine or tobacco, such as cigarettes and e-cigarettes.  If you need help quitting, ask your health care provider. Do not drink alcohol. Make healthy lifestyle choices as recommended. These may include: Getting regular exercise. Ask your health care provider to suggest some activities that are safe for you. Eating a heart-healthy diet. This includes plenty of fresh fruits and vegetables, whole grains, low-fat (lean) protein, and low-fat dairy products. A dietitian can help you find healthy eating options. Maintaining a healthy weight. Managing any other health conditions you have, such as high blood pressure (hypertension) or diabetes. Reducing stress, such as with yoga or relaxation techniques.  General instructions Pay attention to any changes in your symptoms. Tell your health care provider about them or any new symptoms. Avoid any activities that cause chest pain. Keep all follow-up visits as told by your health care provider. This is important. This includes visits for any further testing if your chest pain does not go away. Contact a health care provider if: Your chest pain does not go away. You feel depressed. You have a fever. Get help right away if: Your chest pain gets worse. You have a cough that gets worse, or you cough up blood. You have severe pain in your abdomen. You faint. You have sudden, unexplained chest discomfort. You have sudden, unexplained discomfort in your arms, back, neck, or jaw. You have shortness of breath at any time. You suddenly start to sweat, or your skin gets clammy. You feel nausea or you vomit. You suddenly feel lightheaded or dizzy. You have severe weakness, or unexplained weakness or fatigue. Your heart begins to beat quickly, or it feels like it is  skipping beats. These symptoms may represent a serious problem that is an emergency. Do not wait to see if the symptoms will go away. Get medical help right away. Call your local emergency services (911 in the [place] Do not drive yourself to the  hospital. Summary Chest pain can be caused by a condition that is serious and requires urgent treatment. It may also be caused by something that is not life-threatening. If you have chest pain, it is very important to see your health care provider. Your health care provider may do lab tests and other studies to find the cause of your pain. Follow your health care provider's instructions on taking medicines, making lifestyle changes, and getting emergency treatment if symptoms become worse. Keep all follow-up visits as told by your health care provider. This includes visits for any further testing if your chest pain does not go away. This information is not intended to replace advice given to you by your health care provider. Make sure you discuss any questions you have with your healthcare provider. Document Revised: 02/18/2018 Document Reviewed: 02/18/2018 Elsevier Patient Education  2022 [employer].

## 2021-02-08 ENCOUNTER — Encounter: Payer: Self-pay | Admitting: Family Medicine

## 2021-02-08 LAB — COMPREHENSIVE METABOLIC PANEL
ALT: 18 U/L (ref 0–35)
AST: 14 U/L (ref 0–37)
Albumin: 4.5 g/dL (ref 3.5–5.2)
Alkaline Phosphatase: 63 U/L (ref 39–117)
BUN: 9 mg/dL (ref 6–23)
CO2: 27 mEq/L (ref 19–32)
Calcium: 9.2 mg/dL (ref 8.4–10.5)
Chloride: 103 mEq/L (ref 96–112)
Creatinine, Ser: 0.76 mg/dL (ref 0.40–1.20)
GFR: 103.75 mL/min (ref >60.00)
Glucose, Bld: 77 mg/dL (ref 70–99)
Potassium: 3.8 mEq/L (ref 3.5–5.1)
Sodium: 141 mEq/L (ref 135–145)
Total Bilirubin: 0.3 mg/dL (ref 0.2–1.2)
Total Protein: 7.7 g/dL (ref 6.0–8.3)

## 2021-02-08 LAB — CBC WITH DIFFERENTIAL/PLATELET
Basophils Absolute: 0.1 10*3/uL (ref 0.0–0.1)
Basophils Relative: 1.1 % (ref 0.0–3.0)
Eosinophils Absolute: 0 10*3/uL (ref 0.0–0.7)
Eosinophils Relative: 0.6 % (ref 0.0–5.0)
HCT: 38 % (ref 36.0–46.0)
Hemoglobin: 12.7 g/dL (ref 12.0–15.0)
Lymphocytes Relative: 38.3 % (ref 12.0–46.0)
Lymphs Abs: 2.6 10*3/uL (ref 0.7–4.0)
MCHC: 33.3 g/dL (ref 30.0–36.0)
MCV: 77.7 fl — ABNORMAL LOW (ref 78.0–100.0)
Monocytes Absolute: 0.5 10*3/uL (ref 0.1–1.0)
Monocytes Relative: 7 % (ref 3.0–12.0)
Neutro Abs: 3.6 10*3/uL (ref 1.4–7.7)
Neutrophils Relative %: 53 % (ref 43.0–77.0)
Platelets: 362 10*3/uL (ref 150.0–400.0)
RBC: 4.89 Mil/uL (ref 3.87–5.11)
RDW: 17.7 % — ABNORMAL HIGH (ref 11.5–15.5)
WBC: 6.8 10*3/uL (ref 4.0–10.5)

## 2021-02-08 LAB — CK: Total CK: 58 U/L (ref 7–177)

## 2021-02-08 LAB — TSH: TSH: 0.55 u[IU]/mL (ref 0.35–4.50)

## 2021-02-08 LAB — VITAMIN D 25 HYDROXY (VIT D DEFICIENCY, FRACTURES): VITD: 10.06 ng/mL — ABNORMAL LOW (ref 30.00–100.00)

## 2021-02-10 DIAGNOSIS — R748 Abnormal levels of other serum enzymes: Secondary | ICD-10-CM | POA: Insufficient documentation

## 2021-02-10 DIAGNOSIS — N921 Excessive and frequent menstruation with irregular cycle: Secondary | ICD-10-CM | POA: Insufficient documentation

## 2021-02-10 DIAGNOSIS — E559 Vitamin D deficiency, unspecified: Secondary | ICD-10-CM | POA: Insufficient documentation

## 2021-02-10 DIAGNOSIS — M62838 Other muscle spasm: Secondary | ICD-10-CM | POA: Insufficient documentation

## 2021-02-10 DIAGNOSIS — R0789 Other chest pain: Secondary | ICD-10-CM | POA: Insufficient documentation

## 2021-02-10 DIAGNOSIS — K2960 Other gastritis without bleeding: Secondary | ICD-10-CM | POA: Insufficient documentation
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
## 2021-02-10 NOTE — Assessment & Plan Note (Signed)
Improved to 77 as patient is in office.

## 2021-02-10 NOTE — Assessment & Plan Note (Signed)
Labs reveal deficiency. Start on Vitamin D 50000 IU caps, 1 cap po weekly x 12 weeks. Disp #4 with 4 rf. Also take daily Vitamin D over the counter. If already taking a daily supplement increase by 1000 IU daily and if not start Vitamin D 2000 IU daily.  

## 2021-02-10 NOTE — Assessment & Plan Note (Signed)
She experiences a sharp chest pain while in office which resolves. EKG is unremarkable. She will report if it recurs or worsens. Seek care if occurs and does not resolve

## 2021-02-10 NOTE — Assessment & Plan Note (Signed)
Improved on recheck but vitamin D noted to be very low

## 2021-02-10 NOTE — Assessment & Plan Note (Signed)
Encouraged moist heat and gentle stretching as tolerated. May try NSAIDs and prescription meds as directed and report if symptoms worsen or seek immediate care. Prescribed Tizanidine prn

## 2021-02-10 NOTE — Assessment & Plan Note (Signed)
She reports some level of vaginal bleeding for 6 months but has improved some recently. hgb normal. Referred to OB/GYN for further evaluation

## 2021-02-10 NOTE — Assessment & Plan Note (Signed)
Avoid offending foods, start probiotics. Do not eat large meals in late evening and consider raising head of bed.  

## 2021-02-10 NOTE — Assessment & Plan Note (Signed)
Start Venlafaxine 37.5 and increase to 75 mg daily in a week. Reassess in 2-3 months or as needed.

## 2021-02-11 ENCOUNTER — Other Ambulatory Visit: Payer: Self-pay | Admitting: *Deleted

## 2021-02-11 MED ORDER — VITAMIN D (ERGOCALCIFEROL) 1.25 MG (50000 UNIT) PO CAPS: 50000.0000 [IU] | ORAL_CAPSULE | ORAL | 0 refills | Status: DC

## 2021-02-14 ENCOUNTER — Telehealth: Payer: Self-pay | Admitting: Family Medicine

## 2021-02-14 NOTE — Telephone Encounter (Signed)
Patient wants to know if she could take over the counter caffeine pills

## 2021-02-15 ENCOUNTER — Telehealth (HOSPITAL_COMMUNITY): Payer: Self-pay | Admitting: Internal Medicine

## 2021-02-15 NOTE — Telephone Encounter (Signed)
Pt is aware of below

## 2021-02-15 NOTE — Telephone Encounter (Signed)
Just an FYI. We have made several attempts to contact this patient including sending a letter to schedule or reschedule their echocardiogram. We will be removing the patient from the echo WQ.   02/01/21 NO SHOWED-MAILED LETTER [facility]    Thank you

## 2021-02-22 ENCOUNTER — Other Ambulatory Visit: Payer: Self-pay | Admitting: Family Medicine

## 2021-02-22 MED ORDER — METHYLPHENIDATE HCL 20 MG PO TABS: 20.0000 mg | ORAL_TABLET | Freq: Three times a day (TID) | ORAL | 0 refills | Status: DC

## 2021-03-07 MED ORDER — DIAZEPAM 10 MG PO TABS: 10.0000 mg | ORAL_TABLET | Freq: Four times a day (QID) | ORAL | 1 refills | Status: DC | PRN

## 2021-03-07 NOTE — Telephone Encounter (Signed)
Requesting: diazepam 10mg  Contract: None UDS: 09/24/2019 Last Visit: 02/07/2021 Next Visit: 06/17/2021 Last Refill: 11/23/2020 #90 and 1RF Pt sig: 1 tab tid prn  Please Advise

## 2021-03-27 ENCOUNTER — Telehealth: Payer: Self-pay | Admitting: *Deleted

## 2021-03-27 ENCOUNTER — Other Ambulatory Visit: Payer: Self-pay | Admitting: Family Medicine

## 2021-03-27 NOTE — Telephone Encounter (Signed)
Message from [facility] restarted her Escitalopram at 20 mg daily which is OK but I need to clarify if she is still taking the Venlafaxine 75 mg tabs and 37.5. if she is still taking the Venlafaxine she should stop the 37.5 mg tab  make her an appt to discuss if she needs it can be virtual

## 2021-03-27 NOTE — Telephone Encounter (Signed)
Patient called to see if she can "start taking Escitalopram 30 mg" she reports she stopped the vanlafaxin 2 weeks go and she has been taking Escitlopram 20 mg for 2 weeks from a previous prescription she had.

## 2021-03-27 NOTE — Telephone Encounter (Signed)
Attempted to call but vm was full

## 2021-03-28 NOTE — Telephone Encounter (Signed)
No answer/vm full

## 2021-03-29 MED ORDER — ESCITALOPRAM OXALATE 20 MG PO TABS: 30.0000 mg | ORAL_TABLET | Freq: Every day | ORAL | 0 refills | Status: DC

## 2021-03-29 NOTE — Telephone Encounter (Signed)
Patient stated that she will take 1.'5mg'$  of the '20mg'$ .  New rx sent in and Effexor taken off list.  For follow up we do not have any openings.  Can we fit her in somewhere like a 1pm or 4pm during the week of 8/29 or following week.  She wanted to know why she was only given 30 tabs of the diazepam.  I advised that this medication is a control and only should be taken as needed.  Patient stated that she takes it every day and she has already got her refill on it.  She states that she has to take it when she takes the Ritalin.  She will discuss at next visit.

## 2021-03-29 NOTE — Addendum Note (Signed)
Addended by: Kem Boroughs D on: 03/29/2021 08:51 AM   Modules accepted: Orders

## 2021-04-01 NOTE — Telephone Encounter (Signed)
Left message on machine to call back  

## 2021-04-02 ENCOUNTER — Telehealth: Payer: Self-pay

## 2021-04-02 ENCOUNTER — Other Ambulatory Visit: Payer: Self-pay | Admitting: Family Medicine

## 2021-04-02 DIAGNOSIS — R41840 Attention and concentration deficit: Secondary | ICD-10-CM

## 2021-04-02 MED ORDER — METHYLPHENIDATE HCL 20 MG PO TABS
20.0000 mg | ORAL_TABLET | Freq: Three times a day (TID) | ORAL | 0 refills | Status: DC
Start: 2021-04-02 — End: 2021-04-30

## 2021-04-02 NOTE — Telephone Encounter (Signed)
Pt called in would like a refill on ritalin 20 mg tablet. Dr. Charlett Blake is not here

## 2021-04-02 NOTE — Telephone Encounter (Signed)
Spoke with pt and she stated that appointment was already scheduled but medication was not discussed.  Advised about the diazepam, but patient stated that she takes it twice daily.  She will discuss at office visit on 8/29.

## 2021-04-21 ENCOUNTER — Other Ambulatory Visit: Payer: Self-pay | Admitting: Family Medicine

## 2021-04-22 ENCOUNTER — Telehealth: Payer: Self-pay | Admitting: Family Medicine

## 2021-04-23 NOTE — Telephone Encounter (Signed)
Patient is requesting a refill of the following medications: Requested Prescriptions   Pending Prescriptions Disp Refills   diazepam (VALIUM) 10 MG tablet [Pharmacy Med Name: diazePAM 10 MG Oral Tablet] 30 tablet 0    Sig: TAKE 1 TABLET BY MOUTH EVERY 6 HOURS AS NEEDED FOR ANXIETY    Date of patient request: 04/21/21 Last office visit: 02/07/21 Date of last refill: 03/07/21 Last refill amount: 30 +1 Follow up time period per chart: 04/29/21

## 2021-04-29 ENCOUNTER — Telehealth: Payer: Self-pay | Admitting: Family Medicine

## 2021-04-29 ENCOUNTER — Encounter: Payer: Self-pay | Admitting: Family Medicine

## 2021-04-29 ENCOUNTER — Ambulatory Visit (INDEPENDENT_AMBULATORY_CARE_PROVIDER_SITE_OTHER): Payer: 59 | Admitting: Family Medicine

## 2021-04-29 ENCOUNTER — Other Ambulatory Visit: Payer: Self-pay

## 2021-04-29 DIAGNOSIS — F909 Attention-deficit hyperactivity disorder, unspecified type: Secondary | ICD-10-CM | POA: Diagnosis not present

## 2021-04-29 DIAGNOSIS — F320 Major depressive disorder, single episode, mild: Secondary | ICD-10-CM | POA: Diagnosis not present

## 2021-04-29 DIAGNOSIS — F419 Anxiety disorder, unspecified: Secondary | ICD-10-CM

## 2021-04-29 DIAGNOSIS — E559 Vitamin D deficiency, unspecified: Secondary | ICD-10-CM

## 2021-04-29 MED ORDER — LISDEXAMFETAMINE DIMESYLATE 60 MG PO CAPS: 60.0000 mg | ORAL_CAPSULE | ORAL | 0 refills | Status: DC

## 2021-04-29 MED ORDER — DIAZEPAM 10 MG PO TABS: 10.0000 mg | ORAL_TABLET | Freq: Two times a day (BID) | ORAL | 2 refills | Status: DC | PRN

## 2021-04-29 MED ORDER — BUSPIRONE HCL 15 MG PO TABS: 15.0000 mg | ORAL_TABLET | Freq: Three times a day (TID) | ORAL | 0 refills | Status: DC

## 2021-04-29 NOTE — Telephone Encounter (Signed)
Medication: methylphenidate (RITALIN) 20 MG tablet   Has the patient contacted their pharmacy? Yes.   (If no, request that the patient contact the pharmacy for the refill.) (If yes, when and what did the pharmacy advise?) dr has to resend prescription  Preferred Pharmacy (with phone number or street name): [facility] ([address] [place]  [id number], [place]) [place] [address]  Phone:  [phone]     Agent: Please be advised that RX refills may take up to 3 business days. We ask that you follow-up with your pharmacy.

## 2021-04-29 NOTE — Assessment & Plan Note (Addendum)
Supplement and monitor 

## 2021-04-29 NOTE — Assessment & Plan Note (Signed)
Buspar has been helpful but she still struggles with anxiety. She can continue Diazepam 10 mg daily and is allowed a second dose only for severe anxiety attacks

## 2021-04-29 NOTE — Assessment & Plan Note (Signed)
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 Will discuss at [place] in about a month. She will let us know if she needs [place] prior to this visit.

## 2021-04-29 NOTE — Patient Instructions (Signed)

## 2021-04-29 NOTE — Progress Notes (Signed)
Subjective:   By signing my name below, I, [name], attest that this documentation has been prepared under the direction and in the presence of [name], MD. 04/29/2021   Patient ID: Melissa [name], female    DOB: 02-24-1991, 30 y.o.   MRN: [id number]  Chief Complaint  Patient presents with   Medication Consultation    HPI Patient is in today for an office visit.  She started using the Lexapro for the past 2 months but stopped using it because she does not think it helps to manage the anxiety. She is requesting a refill on 20 mg ritalin and 10 mg diazepam. She also tried adderall but it did not agree with her.  She is willing to try new medication that is long-lasting.  Past Medical History:  Diagnosis Date   Abnormal Pap smear of cervix 07/11/2011   Acid reflux    Anxiety    Asthma    activity induced   Irregular menses    Polycystic ovarian syndrome     Past Surgical History:  Procedure Laterality Date   NO PAST SURGERIES      Family History  Problem Relation Age of Onset   Gallbladder disease Mother    Colon polyps Mother    Hypertension Father    Other Father        Hepatitis A   Diabetes Father    Drug abuse Father    Post-traumatic stress disorder Father    Stomach cancer Maternal Grandmother    Diabetes Maternal Grandmother    Colon polyps Maternal Grandmother    Colon cancer Maternal Grandmother    Cancer Maternal Grandmother        stomach cancer   Alcohol abuse Maternal Grandmother    Emphysema Maternal Grandfather    Cancer Maternal Grandfather        smoke, lung cancer   Alcohol abuse Maternal Grandfather    Heart disease Paternal Grandfather    Alcohol abuse Paternal Grandfather    Breast cancer Other        mat great aunts x 2   Colon cancer Other        mat great uncles x 2   Irritable bowel syndrome Sister    Multiple sclerosis Sister    Bipolar disorder Sister    Colon polyps Paternal Uncle    Alcohol abuse Paternal  Grandmother    Pancreatic cancer Neg Hx    Esophageal cancer Neg Hx     Social History   Socioeconomic History   Marital status: Married    Spouse name: Not on file   Number of children: 0   Years of education: Not on file   Highest education level: Not on file  Occupational History   Occupation: Lexicographer: [employer]   Tobacco Use   Smoking status: Never   Smokeless tobacco: Never  Vaping Use   Vaping Use: Never used  Substance and Sexual Activity   Alcohol use: Not Currently    Alcohol/week: 4.0 - 7.0 standard drinks    Types: 4 - 7 Cans of beer per week    Comment: 1 beer every other ay   Drug use: Not Currently    Types: Marijuana   Sexual activity: Yes    Partners: Male    Birth control/protection: None  Other Topics Concern   Not on file  Social History Narrative   Caffeine use:  1 can soda daily   Regular exercise:  Just started   2 sisters.      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    Social Determinants of Health   Financial Resource Strain: Not on file  Food Insecurity: Not on file  Transportation Needs: Not on file  Physical Activity: Not on file  Stress: Not on file  Social Connections: Not on file  Intimate Partner Violence: Not on file    Outpatient Medications Prior to Visit  Medication Sig Dispense Refill   albuterol (VENTOLIN HFA) 108 (90 Base) MCG/ACT inhaler Inhale 1-2 puffs into the lungs every 6 (six) hours as needed for wheezing or shortness of breath. 18 g 2   ibuprofen (ADVIL) 600 MG tablet Take 600 mg by mouth every 6 (six) hours as needed.     methylphenidate (RITALIN) 20 MG tablet Take 1 tablet (20 mg total) by mouth 3 (three) times daily with meals. 90 tablet 0   tiZANidine (ZANAFLEX) 2 MG tablet Take 0.5-2 tablets (1-4 mg total) by mouth every 8 (eight) hours as  needed for muscle spasms. 30 tablet 2   busPIRone (BUSPAR) 15 MG tablet TAKE 1 TABLET BY MOUTH THREE TIMES DAILY 270 tablet 0   diazepam (VALIUM) 10 MG tablet TAKE 1 TABLET BY MOUTH EVERY 6 HOURS AS NEEDED FOR ANXIETY 30 tablet 0   escitalopram (LEXAPRO) 20 MG tablet Take 1.5 tablets (30 mg total) by mouth daily. 135 tablet 0   Vitamin D, Ergocalciferol, (DRISDOL) 1.25 MG (50000 UNIT) CAPS capsule Take 1 capsule (50,000 Units total) by mouth every 7 (seven) days. 12 capsule 0   No facility-administered medications prior to visit.    No Active Allergies   Review of Systems  Constitutional:  Negative for fever and malaise/fatigue.  HENT:  Negative for congestion.   Eyes:  Negative for redness.  Respiratory:  Negative for shortness of breath.   Cardiovascular:  Negative for chest pain, palpitations and leg swelling.  Gastrointestinal:  Negative for abdominal pain, blood in stool and nausea.  Genitourinary:  Negative for dysuria and frequency.  Musculoskeletal:  Negative for falls.  Skin:  Negative for rash.  Neurological:  Negative for dizziness, loss of consciousness and headaches.  Endo/Heme/Allergies:  Negative for polydipsia.  Psychiatric/Behavioral:  Positive for depression. The patient is nervous/anxious.       Objective:    Physical Exam Constitutional:      General: She is not in acute distress.    Appearance: She is well-developed.  HENT:     Head: Normocephalic and atraumatic.  Eyes:     Conjunctiva/sclera: Conjunctivae normal.  Neck:     Thyroid: No thyromegaly.  Cardiovascular:     Rate and Rhythm: Normal rate and regular rhythm.     Heart sounds: Normal heart sounds. No murmur heard. Pulmonary:     Effort: Pulmonary effort is normal. No respiratory distress.     Breath sounds: Normal breath sounds.  Abdominal:     General: Bowel sounds are normal. There is no distension.     Palpations: Abdomen is soft. There is no mass.     Tenderness: There is no abdominal  tenderness.  Musculoskeletal:     Cervical back: Neck supple.  Lymphadenopathy:     Cervical: No cervical adenopathy.  Skin:    General: Skin is warm and dry.  Neurological:  Mental Status: She is alert and oriented to person, place, and time.  Psychiatric:        Behavior: Behavior normal.    BP 112/74   Pulse (!) 102   Temp 97.9 F (36.6 C)   Resp 18   Wt 231 lb 3.2 oz (104.9 kg)   BMI 42.29 kg/m  Wt Readings from Last 3 Encounters:  04/29/21 231 lb 3.2 oz (104.9 kg)  01/07/21 232 lb 6.4 oz (105.4 kg)  12/16/19 227 lb (103 kg)    Diabetic Foot Exam - Simple   No data filed    Lab Results  Component Value Date   WBC 6.8 02/07/2021   HGB 12.7 02/07/2021   HCT 38.0 02/07/2021   PLT 362.0 02/07/2021   GLUCOSE 77 02/07/2021   CHOL 208 (H) 11/14/2019   TRIG 100.0 11/14/2019   HDL 48.80 11/14/2019   LDLCALC 139 (H) 11/14/2019   ALT 18 02/07/2021   AST 14 02/07/2021   NA 141 02/07/2021   K 3.8 02/07/2021   CL 103 02/07/2021   CREATININE 0.76 02/07/2021   BUN 9 02/07/2021   CO2 27 02/07/2021   TSH 0.55 02/07/2021   HGBA1C 6.0 11/15/2019    Lab Results  Component Value Date   TSH 0.55 02/07/2021   Lab Results  Component Value Date   WBC 6.8 02/07/2021   HGB 12.7 02/07/2021   HCT 38.0 02/07/2021   MCV 77.7 (L) 02/07/2021   PLT 362.0 02/07/2021   Lab Results  Component Value Date   NA 141 02/07/2021   K 3.8 02/07/2021   CO2 27 02/07/2021   GLUCOSE 77 02/07/2021   BUN 9 02/07/2021   CREATININE 0.76 02/07/2021   BILITOT 0.3 02/07/2021   ALKPHOS 63 02/07/2021   AST 14 02/07/2021   ALT 18 02/07/2021   PROT 7.7 02/07/2021   ALBUMIN 4.5 02/07/2021   CALCIUM 9.2 02/07/2021   ANIONGAP 12 12/28/2020   GFR 103.75 02/07/2021   Lab Results  Component Value Date   CHOL 208 (H) 11/14/2019   Lab Results  Component Value Date   HDL 48.80 11/14/2019   Lab Results  Component Value Date   LDLCALC 139 (H) 11/14/2019   Lab Results  Component Value  Date   TRIG 100.0 11/14/2019   Lab Results  Component Value Date   CHOLHDL 4 11/14/2019   Lab Results  Component Value Date   HGBA1C 6.0 11/15/2019       Assessment & Plan:   Problem List Items Addressed This Visit     Depression    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 Will discuss at [place] in about a month. She will let us know if she needs [place] prior to this visit.       Relevant Medications   diazepam (VALIUM) 10 MG tablet   busPIRone (BUSPAR) 15 MG tablet   Anxiety    Buspar has been helpful but she still struggles with anxiety. She can continue Diazepam 10 mg daily and is allowed a second dose only for severe anxiety attacks      Relevant Medications   diazepam (VALIUM) 10 MG tablet  busPIRone (BUSPAR) 15 MG tablet   Adult ADHD    Taking Adderall 20 mg po tid and notes that when the doses wear off her anxiety spikes. Will try switching to Vyvanse 60 mg daily and reassess.      Vitamin D deficiency    Supplement and monitor       Meds ordered this encounter  Medications   lisdexamfetamine (VYVANSE) 60 MG capsule    Sig: Take 1 capsule (60 mg total) by mouth every morning.    Dispense:  30 capsule    Refill:  0   diazepam (VALIUM) 10 MG tablet    Sig: Take 1 tablet (10 mg total) by mouth every 12 (twelve) hours as needed for anxiety.    Dispense:  60 tablet    Refill:  2   busPIRone (BUSPAR) 15 MG tablet    Sig: Take 1 tablet (15 mg total) by mouth 3 (three) times daily.    Dispense:  270 tablet    Refill:  0    I,[name],acting as a scribe for Penni Homans, MD.,have documented all relevant documentation on the behalf of Penni Homans, MD,as directed by  Penni Homans, MD while  in the presence of Penni Homans, MD.   I, [name], MD. , personally preformed the services described in this documentation.  All medical record entries made by the scribe were at my direction and in my presence.  I have reviewed the chart and discharge instructions (if applicable) and agree that the record reflects my personal performance and is accurate and complete. 04/29/2021

## 2021-04-29 NOTE — Assessment & Plan Note (Addendum)
Taking Adderall 20 mg po tid and notes that when the doses wear off her anxiety spikes. Will try switching to Vyvanse 60 mg daily and reassess.

## 2021-04-30 ENCOUNTER — Other Ambulatory Visit: Payer: Self-pay | Admitting: Family Medicine

## 2021-04-30 DIAGNOSIS — R41840 Attention and concentration deficit: Secondary | ICD-10-CM

## 2021-04-30 MED ORDER — METHYLPHENIDATE HCL 20 MG PO TABS: 20.0000 mg | ORAL_TABLET | Freq: Three times a day (TID) | ORAL | 0 refills | Status: DC

## 2021-04-30 NOTE — Telephone Encounter (Signed)
Patient rx was changed at visit yesterday to vyvanse

## 2021-05-28 ENCOUNTER — Other Ambulatory Visit: Payer: Self-pay | Admitting: Family Medicine

## 2021-05-28 DIAGNOSIS — R41840 Attention and concentration deficit: Secondary | ICD-10-CM

## 2021-05-28 MED ORDER — METHYLPHENIDATE HCL 20 MG PO TABS: 20.0000 mg | ORAL_TABLET | Freq: Three times a day (TID) | ORAL | 0 refills | Status: DC

## 2021-05-28 NOTE — Telephone Encounter (Signed)
Requesting: Ritalin 20mg  Contract: none  UDS: none Last Visit: 04/29/21 Next Visit: 06/17/21 Last Refill: 04/30/21  Please Advise

## 2021-06-06 ENCOUNTER — Encounter: Payer: Self-pay | Admitting: Family Medicine

## 2021-06-06 ENCOUNTER — Other Ambulatory Visit: Payer: Self-pay | Admitting: Family Medicine

## 2021-06-06 DIAGNOSIS — R41840 Attention and concentration deficit: Secondary | ICD-10-CM

## 2021-06-06 NOTE — Telephone Encounter (Signed)
Requesting: ritalin Contract: n/a UDS:n/a Last Visit:04/29/21 Next Visit:06/17/21 Last Refill:05/28/21  Please Advise

## 2021-06-10 NOTE — Telephone Encounter (Signed)
Pt is being scheduled for Friday 06/14/21 at 10am

## 2021-06-14 ENCOUNTER — Telehealth: Payer: 59 | Admitting: Family Medicine

## 2021-06-17 ENCOUNTER — Ambulatory Visit (INDEPENDENT_AMBULATORY_CARE_PROVIDER_SITE_OTHER): Payer: 59 | Admitting: Family Medicine

## 2021-06-17 ENCOUNTER — Other Ambulatory Visit (HOSPITAL_COMMUNITY)
Admission: RE | Admit: 2021-06-17 | Discharge: 2021-06-17 | Disposition: A | Discharge status: Home / Self Care | Payer: 59 | Source: Ambulatory Visit | Attending: Family Medicine | Admitting: Family Medicine

## 2021-06-17 ENCOUNTER — Other Ambulatory Visit: Payer: Self-pay

## 2021-06-17 VITALS — BP 120/78 | HR 104 | Temp 98.2°F | Resp 16 | Ht 62.0 in | Wt 225.6 lb

## 2021-06-17 DIAGNOSIS — R350 Frequency of micturition: Secondary | ICD-10-CM | POA: Insufficient documentation

## 2021-06-17 DIAGNOSIS — Z0000 Encounter for general adult medical examination without abnormal findings: Secondary | ICD-10-CM | POA: Diagnosis not present

## 2021-06-17 DIAGNOSIS — R41840 Attention and concentration deficit: Secondary | ICD-10-CM | POA: Diagnosis not present

## 2021-06-17 DIAGNOSIS — N939 Abnormal uterine and vaginal bleeding, unspecified: Secondary | ICD-10-CM | POA: Insufficient documentation

## 2021-06-17 DIAGNOSIS — F909 Attention-deficit hyperactivity disorder, unspecified type: Secondary | ICD-10-CM

## 2021-06-17 DIAGNOSIS — Z79899 Other long term (current) drug therapy: Secondary | ICD-10-CM

## 2021-06-17 DIAGNOSIS — N921 Excessive and frequent menstruation with irregular cycle: Secondary | ICD-10-CM

## 2021-06-17 DIAGNOSIS — F32A Depression, unspecified: Secondary | ICD-10-CM

## 2021-06-17 DIAGNOSIS — N761 Subacute and chronic vaginitis: Secondary | ICD-10-CM | POA: Diagnosis present

## 2021-06-17 DIAGNOSIS — Z23 Encounter for immunization: Secondary | ICD-10-CM | POA: Diagnosis not present

## 2021-06-17 DIAGNOSIS — F320 Major depressive disorder, single episode, mild: Secondary | ICD-10-CM

## 2021-06-17 DIAGNOSIS — F339 Major depressive disorder, recurrent, unspecified: Secondary | ICD-10-CM

## 2021-06-17 DIAGNOSIS — Z1159 Encounter for screening for other viral diseases: Secondary | ICD-10-CM

## 2021-06-17 DIAGNOSIS — E559 Vitamin D deficiency, unspecified: Secondary | ICD-10-CM

## 2021-06-17 DIAGNOSIS — F419 Anxiety disorder, unspecified: Secondary | ICD-10-CM

## 2021-06-17 LAB — POC URINALSYSI DIPSTICK (AUTOMATED)
Bilirubin, UA: NEGATIVE
Glucose, UA: NEGATIVE
Ketones, UA: 80
Leukocytes, UA: NEGATIVE
Nitrite, UA: NEGATIVE
Protein, UA: NEGATIVE
Spec Grav, UA: 1.015 (ref 1.010–1.025)
Urobilinogen, UA: 0.2 E.U./dL
pH, UA: 7 (ref 5.0–8.0)

## 2021-06-17 MED ORDER — FAMOTIDINE 40 MG PO TABS: 40.0000 mg | ORAL_TABLET | Freq: Every day | ORAL | 3 refills | Status: DC

## 2021-06-17 MED ORDER — VENLAFAXINE HCL ER 150 MG PO CP24: 150.0000 mg | ORAL_CAPSULE | Freq: Every day | ORAL | 3 refills | Status: DC

## 2021-06-17 MED ORDER — BUSPIRONE HCL 15 MG PO TABS: 15.0000 mg | ORAL_TABLET | Freq: Three times a day (TID) | ORAL | 3 refills | Status: DC

## 2021-06-17 NOTE — Assessment & Plan Note (Signed)
Till continue Ritalin tid which has worked better for her than any other option in the past

## 2021-06-17 NOTE — Assessment & Plan Note (Signed)
Supplement and monitor 

## 2021-06-17 NOTE — Progress Notes (Signed)
Subjective:   By signing my name below, I, Melissa [name], attest that this documentation has been prepared under the direction and in the presence of [name], MD. 06/17/2021   Patient ID: Melissa [name], female    DOB: Oct 19, 1990, 30 y.o.   MRN: [id number]  No chief complaint on file.   HPI Patient is in today for a comprehensive physical exam.  She reports she has been experiencing irregular bleeding everyday for the past 2 years. She describes it as spotting and mentions it is not heavy and there is no associated pain. Denies lower back pain and lower abdominal pain.  She also mentions upper abdominal pain but thinks it might be due to stress. Bowel movements are regular. She also suspects a yeast infection because she has been smelling "fishy" and there is urine frequency.   She reports she is doing well with her current set of medications and has no complaints at this time. She has had no side effects and has not felt any different. She would like to change her dosage of Buspar to 30 days.  She has not been focusing on school due to health issues with her husband. She started working night shifts and plans to do it for a while.  She mentions that her pulse has been high since she was hospitalized with Covid-19. She does not take any medication but lies down or takes deep breaths to reduce her pulse.  She denies fever, congestion, eye pain, chest pain, palpitations, leg swelling, shortness of breath, nausea, diarrhea and blood in stool. Also denies dysuria, back pain and headaches.   She is willing to get the flu vaccine at this visit and the Covid-19 booster vaccine.   There has been no recent changes in family medical history.  Past Medical History:  Diagnosis Date   Abnormal Pap smear of cervix 07/11/2011   Acid reflux    Anxiety    Asthma    activity induced   Irregular menses    Polycystic ovarian syndrome     Past Surgical History:  Procedure Laterality Date    NO PAST SURGERIES      Family History  Problem Relation Age of Onset   Gallbladder disease Mother    Colon polyps Mother    Hypertension Father    Other Father        Hepatitis A   Diabetes Father    Drug abuse Father    Post-traumatic stress disorder Father    Stomach cancer Maternal Grandmother    Diabetes Maternal Grandmother    Colon polyps Maternal Grandmother    Colon cancer Maternal Grandmother    Cancer Maternal Grandmother        stomach cancer   Alcohol abuse Maternal Grandmother    Emphysema Maternal Grandfather    Cancer Maternal Grandfather        smoke, lung cancer   Alcohol abuse Maternal Grandfather    Heart disease Paternal Grandfather    Alcohol abuse Paternal Grandfather    Breast cancer Other        mat great aunts x 2   Colon cancer Other        mat great uncles x 2   Irritable bowel syndrome Sister    Multiple sclerosis Sister    Bipolar disorder Sister    Colon polyps Paternal Uncle    Alcohol abuse Paternal Grandmother    Pancreatic cancer Neg Hx    Esophageal cancer Neg Hx  Social History   Socioeconomic History   Marital status: Married    Spouse name: Not on file   Number of children: 0   Years of education: Not on file   Highest education level: Not on file  Occupational History   Occupation: student    Employer: [employer]   Tobacco Use   Smoking status: Never   Smokeless tobacco: Never  Vaping Use   Vaping Use: Never used  Substance and Sexual Activity   Alcohol use: Not Currently    Alcohol/week: 4.0 - 7.0 standard drinks    Types: 4 - 7 Cans of beer per week    Comment: 1 beer every other ay   Drug use: Not Currently    Types: Marijuana   Sexual activity: Yes    Partners: Male    Birth control/protection: None  Other Topics Concern   Not on file  Social History Narrative   Caffeine use:  1 can soda daily   Regular exercise:  Just started   2 sisters.      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    Social Determinants of Health   Financial Resource Strain: Not on file  Food Insecurity: Not on file  Transportation Needs: Not on file  Physical Activity: Not on file  Stress: Not on file  Social Connections: Not on file  Intimate Partner Violence: Not on file    Outpatient Medications Prior to Visit  Medication Sig Dispense Refill   albuterol (VENTOLIN HFA) 108 (90 Base) MCG/ACT inhaler Inhale 1-2 puffs into the lungs every 6 (six) hours as needed for wheezing or shortness of breath. 18 g 2   diazepam (VALIUM) 10 MG tablet Take 1 tablet (10 mg total) by mouth every 12 (twelve) hours as needed for anxiety. 60 tablet 2   ibuprofen (ADVIL) 600 MG tablet Take 600 mg by mouth every 6 (six) hours as needed.     methylphenidate (RITALIN) 20 MG tablet Take 1 tablet (20 mg total) by mouth 3 (three) times daily with meals. 90 tablet 0   tiZANidine (ZANAFLEX) 2 MG tablet Take 0.5-2 tablets (1-4 mg total) by mouth every 8 (eight) hours as needed for muscle spasms. 30 tablet 2   busPIRone (BUSPAR) 15 MG tablet Take 1 tablet (15 mg total) by mouth 3 (three) times daily. 270 tablet 0   No facility-administered medications prior to visit.    No Active Allergies  Review of Systems  Constitutional:  Negative for fever.  HENT:  Negative for congestion.   Eyes:  Negative for pain.  Respiratory:  Negative for shortness of breath.   Cardiovascular:  Negative for chest pain, palpitations and leg swelling.  Gastrointestinal:  Positive for abdominal pain. Negative for blood in stool, diarrhea and nausea.  Genitourinary:  Positive for frequency. Negative for dysuria.       (+) irregular bleeding   Musculoskeletal:  Negative for back pain.  Neurological:  Negative for headaches.  Psychiatric/Behavioral:  Positive for depression. The patient is nervous/anxious.        Objective:    Physical Exam Constitutional:      General: She is not in acute distress.    Appearance: She is well-developed.  HENT:     Head: Normocephalic and atraumatic.     Right Ear: Tympanic  membrane, ear canal and external ear normal.     Left Ear: Tympanic membrane, ear canal and external ear normal.  Eyes:     Conjunctiva/sclera: Conjunctivae normal.     Comments: No nystagmus  Neck:     Thyroid: No thyromegaly.  Cardiovascular:     Rate and Rhythm: Normal rate and regular rhythm.     Heart sounds: Normal heart sounds. No murmur heard. Pulmonary:     Effort: Pulmonary effort is normal. No respiratory distress.     Breath sounds: Normal breath sounds.  Abdominal:     General: Bowel sounds are normal. There is no distension.     Palpations: Abdomen is soft. There is no mass.     Tenderness: There is no abdominal tenderness.  Musculoskeletal:     Cervical back: Neck supple.     Comments: 5/5 strength in upper and lower extremities  Lymphadenopathy:     Cervical: No cervical adenopathy.  Skin:    General: Skin is warm and dry.  Neurological:     Mental Status: She is alert and oriented to person, place, and time.  Psychiatric:        Behavior: Behavior normal.    BP 120/78   Pulse (!) 104   Temp 98.2 F (36.8 C)   Resp 16   Ht 5\' 2"  (1.575 m)   Wt 225 lb 9.6 oz (102.3 kg)   SpO2 98%   BMI 41.26 kg/m  Wt Readings from Last 3 Encounters:  06/17/21 225 lb 9.6 oz (102.3 kg)  04/29/21 231 lb 3.2 oz (104.9 kg)  01/07/21 232 lb 6.4 oz (105.4 kg)    Diabetic Foot Exam - Simple   No data filed    Lab Results  Component Value Date   WBC 6.8 02/07/2021   HGB 12.7 02/07/2021   HCT 38.0 02/07/2021   PLT 362.0 02/07/2021   GLUCOSE 77 02/07/2021   CHOL 208 (H) 11/14/2019   TRIG 100.0 11/14/2019   HDL 48.80 11/14/2019   LDLCALC 139 (H) 11/14/2019   ALT 18 02/07/2021   AST 14 02/07/2021   NA 141 02/07/2021   K 3.8 02/07/2021   CL 103 02/07/2021    CREATININE 0.76 02/07/2021   BUN 9 02/07/2021   CO2 27 02/07/2021   TSH 0.55 02/07/2021   HGBA1C 6.0 11/15/2019    Lab Results  Component Value Date   TSH 0.55 02/07/2021   Lab Results  Component Value Date   WBC 6.8 02/07/2021   HGB 12.7 02/07/2021   HCT 38.0 02/07/2021   MCV 77.7 (L) 02/07/2021   PLT 362.0 02/07/2021   Lab Results  Component Value Date   NA 141 02/07/2021   K 3.8 02/07/2021   CO2 27 02/07/2021   GLUCOSE 77 02/07/2021   BUN 9 02/07/2021   CREATININE 0.76 02/07/2021   BILITOT 0.3 02/07/2021   ALKPHOS 63 02/07/2021   AST 14 02/07/2021   ALT 18 02/07/2021   PROT 7.7 02/07/2021   ALBUMIN 4.5 02/07/2021   CALCIUM 9.2 02/07/2021   ANIONGAP 12 12/28/2020   GFR 103.75 02/07/2021   Lab Results  Component Value Date   CHOL 208 (H) 11/14/2019   Lab Results  Component Value Date   HDL 48.80 11/14/2019   Lab Results  Component Value Date   LDLCALC 139 (H) 11/14/2019   Lab Results  Component Value Date   TRIG 100.0 11/14/2019   Lab Results  Component Value Date   CHOLHDL 4 11/14/2019  Lab Results  Component Value Date   HGBA1C 6.0 11/15/2019       Pap Smear: Last completed on 11/14/2019. Results were normal. Repeat in 3 years.  Assessment & Plan:   Problem List Items Addressed This Visit     Depression   Relevant Medications   venlafaxine XR (EFFEXOR-XR) 150 MG 24 hr capsule   busPIRone (BUSPAR) 15 MG tablet   Poor concentration   Relevant Orders   Ambulatory referral to [place] and depression    Will try increasing Effexor XR to 150 mg and continue Diazepam and Buspar. She struggles with school, a bad marriage to a man who drinks too much and has recently been hospitalized for pancreatitis. She is referred back for counseling as well. Her children are currently in counseling also      Relevant Medications   venlafaxine XR (EFFEXOR-XR) 150 MG 24 hr capsule   busPIRone (BUSPAR) 15 MG tablet   Preventative  health care    Patient encouraged to maintain heart healthy diet, regular exercise, adequate sleep. Consider daily probiotics. Take medications as prescribed. Labs ordered and reviewed. Pap last year was normal      Relevant Orders   Hemoglobin A1c   CBC with Differential/Platelet   Comprehensive metabolic panel   TSH   Lipid panel   Adult ADHD    Till continue Ritalin tid which has worked better for her than any other option in the past      Menometrorrhagia    She acknowledges low level bleeding essentially daily for upwards of 2 years. No pain associated but some chronic vaginitis noted today. STD testing run today and she is referred to OB/GYN for further work up.       Vitamin D deficiency - Primary    Supplement and monitor      Relevant Orders   Vitamin D 1,25 dihydroxy   Menstrual bleeding problem   Relevant Orders   Ambulatory referral to Obstetrics / Gynecology   Urine cytology ancillary only([facility])   Chronic vaginitis    She describes a bad odor but no pain or itching. Check urine ancillary testing for STDs and she is encouraged to start a daily probiotic and try GoodCleanLove cleaning products.       Relevant Orders   Ambulatory referral to Obstetrics / Gynecology   Urine Culture   Urine cytology ancillary only([facility])   RPR   HIV antibody (with reflex)   Other Visit Diagnoses     High risk medication use       Relevant Orders   Drug Tox Monitor 1 w/Conf, Oral Fld   Encounter for hepatitis C screening test for low risk patient       Relevant Orders   Hepatitis C antibody   Need for hepatitis C screening test       Relevant Orders   Hepatitis C antibody   Depression, recurrent ([facility])       Relevant Medications   venlafaxine XR (EFFEXOR-XR) 150 MG 24 hr capsule   busPIRone (BUSPAR) 15 MG tablet   Other Relevant Orders   Ambulatory referral to [facility]   Urinary frequency       Relevant Orders   Urine Culture   Urine cytology  ancillary only([facility])   POCT Urinalysis Dipstick (Automated) (Completed)   Need for influenza vaccination       Relevant Orders   Flu Vaccine QUAD 36+ mos IM (Fluarix, Fluzone & Afluria Quad PF (  Completed)       Meds ordered this encounter  Medications   venlafaxine XR (EFFEXOR-XR) 150 MG 24 hr capsule    Sig: Take 1 capsule (150 mg total) by mouth daily with breakfast.    Dispense:  30 capsule    Refill:  3   busPIRone (BUSPAR) 15 MG tablet    Sig: Take 1 tablet (15 mg total) by mouth 3 (three) times daily.    Dispense:  90 tablet    Refill:  3   famotidine (PEPCID) 40 MG tablet    Sig: Take 1 tablet (40 mg total) by mouth daily.    Dispense:  30 tablet    Refill:  3    I,Melissa [name],acting as a scribe for Penni Homans, MD.,have documented all relevant documentation on the behalf of Penni Homans, MD,as directed by  Penni Homans, MD while in the presence of Penni Homans, MD.   I, [name], MD., personally preformed the services described in this documentation.  All medical record entries made by the scribe were at my direction and in my presence.  I have reviewed the chart and discharge instructions (if applicable) and agree that the record reflects my personal performance and is accurate and complete. 06/17/2021

## 2021-06-17 NOTE — Assessment & Plan Note (Signed)
Will try increasing Effexor XR to 150 mg and continue Diazepam and Buspar. She struggles with school, a bad marriage to a man who drinks too much and has recently been hospitalized for pancreatitis. She is referred back for counseling as well. Her children are currently in counseling also

## 2021-06-17 NOTE — Patient Instructions (Signed)
Preventive Care 21-32 Years Old, Female Preventive care refers to lifestyle choices and visits with your health care provider that can promote health and wellness. This includes: A yearly physical exam. This is also called an annual wellness visit. Regular dental and eye exams. Immunizations. Screening for certain conditions. Healthy lifestyle choices, such as: Eating a healthy diet. Getting regular exercise. Not using drugs or products that contain nicotine and tobacco. Limiting alcohol use. What can I expect for my preventive care visit? Physical exam Your health care provider may check your: Height and weight. These may be used to calculate your BMI (body mass index). BMI is a measurement that tells if you are at a healthy weight. Heart rate and blood pressure. Body temperature. Skin for abnormal spots. Counseling Your health care provider may ask you questions about your: Past medical problems. Family's medical history. Alcohol, tobacco, and drug use. Emotional well-being. Home life and relationship well-being. Sexual activity. Diet, exercise, and sleep habits. Work and work environment. Access to firearms. Method of birth control. Menstrual cycle. Pregnancy history. What immunizations do I need? Vaccines are usually given at various ages, according to a schedule. Your health care provider will recommend vaccines for you based on your age, medical history, and lifestyle or other factors, such as travel or where you work. What tests do I need? Blood tests Lipid and cholesterol levels. These may be checked every 5 years starting at age 20. Hepatitis C test. Hepatitis B test. Screening Diabetes screening. This is done by checking your blood sugar (glucose) after you have not eaten for a while (fasting). STD (sexually transmitted disease) testing, if you are at risk. BRCA-related cancer screening. This may be done if you have a family history of breast, ovarian, tubal, or  peritoneal cancers. Pelvic exam and Pap test. This may be done every 3 years starting at age 21. Starting at age 30, this may be done every 5 years if you have a Pap test in combination with an HPV test. Talk with your health care provider about your test results, treatment options, and if necessary, the need for more tests. Follow these instructions at home: Eating and drinking  Eat a healthy diet that includes fresh fruits and vegetables, whole grains, lean protein, and low-fat dairy products. Take vitamin and mineral supplements as recommended by your health care provider. Do not drink alcohol if: Your health care provider tells you not to drink. You are pregnant, may be pregnant, or are planning to become pregnant. If you drink alcohol: Limit how much you have to 0-1 drink a day. Be aware of how much alcohol is in your drink. In the [place] one drink equals one 12 oz bottle of beer (355 mL), one 5 oz glass of wine (148 mL), or one 1 oz glass of hard liquor (44 mL). Lifestyle Take daily care of your teeth and gums. Brush your teeth every morning and night with fluoride toothpaste. Floss one time each day. Stay active. Exercise for at least 30 minutes 5 or more days each week. Do not use any products that contain nicotine or tobacco, such as cigarettes, e-cigarettes, and chewing tobacco. If you need help quitting, ask your health care provider. Do not use drugs. If you are sexually active, practice safe sex. Use a condom or other form of protection to prevent STIs (sexually transmitted infections). If you do not wish to become pregnant, use a form of birth control. If you plan to become pregnant, see your health care provider   for a prepregnancy visit. Find healthy ways to cope with stress, such as: Meditation, yoga, or listening to music. Journaling. Talking to a trusted person. Spending time with friends and family. Safety Always wear your seat belt while driving or riding in a  vehicle. Do not drive: If you have been drinking alcohol. Do not ride with someone who has been drinking. When you are tired or distracted. While texting. Wear a helmet and other protective equipment during sports activities. If you have firearms in your house, make sure you follow all gun safety procedures. Seek help if you have been physically or sexually abused. What's next? Go to your health care provider once a year for an annual wellness visit. Ask your health care provider how often you should have your eyes and teeth checked. Stay up to date on all vaccines. This information is not intended to replace advice given to you by your health care provider. Make sure you discuss any questions you have with your health care provider. Document Revised: 10/26/2020 Document Reviewed: 04/29/2018 Elsevier Patient Education  2022 Elsevier Inc.  

## 2021-06-17 NOTE — Assessment & Plan Note (Signed)
She describes a bad odor but no pain or itching. Check urine ancillary testing for STDs and she is encouraged to start a daily probiotic and try GoodCleanLove cleaning products.

## 2021-06-17 NOTE — Assessment & Plan Note (Signed)
She acknowledges low level bleeding essentially daily for upwards of 2 years. No pain associated but some chronic vaginitis noted today. STD testing run today and she is referred to OB/GYN for further work up.

## 2021-06-17 NOTE — Assessment & Plan Note (Addendum)
Patient encouraged to maintain heart healthy diet, regular exercise, adequate sleep. Consider daily probiotics. Take medications as prescribed. Labs ordered and reviewed. Pap last year was normal

## 2021-06-18 LAB — CBC WITH DIFFERENTIAL/PLATELET
Basophils Absolute: 0.1 10*3/uL (ref 0.0–0.1)
Basophils Relative: 1.3 % (ref 0.0–3.0)
Eosinophils Absolute: 0.1 10*3/uL (ref 0.0–0.7)
Eosinophils Relative: 0.8 % (ref 0.0–5.0)
HCT: 40.9 % (ref 36.0–46.0)
Hemoglobin: 12.9 g/dL (ref 12.0–15.0)
Lymphocytes Relative: 49.1 % — ABNORMAL HIGH (ref 12.0–46.0)
Lymphs Abs: 3.3 10*3/uL (ref 0.7–4.0)
MCHC: 31.5 g/dL (ref 30.0–36.0)
MCV: 79.5 fl (ref 78.0–100.0)
Monocytes Absolute: 0.4 10*3/uL (ref 0.1–1.0)
Monocytes Relative: 6.4 % (ref 3.0–12.0)
Neutro Abs: 2.9 10*3/uL (ref 1.4–7.7)
Neutrophils Relative %: 42.4 % — ABNORMAL LOW (ref 43.0–77.0)
Platelets: 427 10*3/uL — ABNORMAL HIGH (ref 150.0–400.0)
RBC: 5.14 Mil/uL — ABNORMAL HIGH (ref 3.87–5.11)
RDW: 17.1 % — ABNORMAL HIGH (ref 11.5–15.5)
WBC: 6.8 10*3/uL (ref 4.0–10.5)

## 2021-06-18 LAB — LIPID PANEL
Cholesterol: 220 mg/dL — ABNORMAL HIGH (ref 0–200)
HDL: 46.1 mg/dL (ref >39.00)
LDL Cholesterol: 149 mg/dL — ABNORMAL HIGH (ref 0–99)
NonHDL: 174.39
Total CHOL/HDL Ratio: 5
Triglycerides: 127 mg/dL (ref 0.0–149.0)
VLDL: 25.4 mg/dL (ref 0.0–40.0)

## 2021-06-18 LAB — COMPREHENSIVE METABOLIC PANEL
ALT: 16 U/L (ref 0–35)
AST: 13 U/L (ref 0–37)
Albumin: 4.6 g/dL (ref 3.5–5.2)
Alkaline Phosphatase: 75 U/L (ref 39–117)
BUN: 11 mg/dL (ref 6–23)
CO2: 29 mEq/L (ref 19–32)
Calcium: 9.6 mg/dL (ref 8.4–10.5)
Chloride: 99 mEq/L (ref 96–112)
Creatinine, Ser: 0.74 mg/dL (ref 0.40–1.20)
GFR: 106.85 mL/min (ref >60.00)
Glucose, Bld: 77 mg/dL (ref 70–99)
Potassium: 3.9 mEq/L (ref 3.5–5.1)
Sodium: 139 mEq/L (ref 135–145)
Total Bilirubin: 0.4 mg/dL (ref 0.2–1.2)
Total Protein: 7.6 g/dL (ref 6.0–8.3)

## 2021-06-18 LAB — HEMOGLOBIN A1C: Hgb A1c MFr Bld: 6.4 % (ref 4.6–6.5)

## 2021-06-18 LAB — TSH: TSH: 2.32 u[IU]/mL (ref 0.35–5.50)

## 2021-06-19 LAB — URINE CYTOLOGY ANCILLARY ONLY
Bacterial Vaginitis-Urine: NEGATIVE
Candida Urine: NEGATIVE
Chlamydia: NEGATIVE
Comment: NEGATIVE
Comment: NEGATIVE
Comment: NORMAL
Neisseria Gonorrhea: NEGATIVE
Trichomonas: NEGATIVE

## 2021-06-20 LAB — URINE CULTURE
MICRO NUMBER:: 12511112
SPECIMEN QUALITY:: ADEQUATE

## 2021-06-20 LAB — DRUG MONITORING PANEL 376104, URINE

## 2021-06-21 LAB — DRUG TOX MONITOR 1 W/CONF, ORAL FLD
Alprazolam: NEGATIVE ng/mL (ref <0.50)
Amphetamine: 27 ng/mL — ABNORMAL HIGH (ref <10)
Amphetamines: POSITIVE ng/mL — AB (ref <10)
Barbiturates: NEGATIVE ng/mL (ref <10)
Benzodiazepines: POSITIVE ng/mL — AB (ref <0.50)
Buprenorphine: NEGATIVE ng/mL (ref <0.10)
Chlordiazepoxide: NEGATIVE ng/mL (ref <0.50)
Clonazepam: NEGATIVE ng/mL (ref <0.50)
Cocaine: NEGATIVE ng/mL (ref <5.0)
Diazepam: 11.79 ng/mL — ABNORMAL HIGH (ref <0.50)
Fentanyl: NEGATIVE ng/mL (ref <0.10)
Flunitrazepam: NEGATIVE ng/mL (ref <0.50)
Flurazepam: NEGATIVE ng/mL (ref <0.50)
Heroin Metabolite: NEGATIVE ng/mL (ref <1.0)
Lorazepam: NEGATIVE ng/mL (ref <0.50)
MARIJUANA: NEGATIVE ng/mL (ref <2.5)
MDMA: NEGATIVE ng/mL (ref <10)
Meprobamate: NEGATIVE ng/mL (ref <2.5)
Methadone: NEGATIVE ng/mL (ref <5.0)
Methamphetamine: NEGATIVE ng/mL (ref <10)
Midazolam: NEGATIVE ng/mL (ref <0.50)
Nicotine Metabolite: NEGATIVE ng/mL (ref <5.0)
Nordiazepam: 18.42 ng/mL — ABNORMAL HIGH (ref <0.50)
Opiates: NEGATIVE ng/mL (ref <2.5)
Oxazepam: 0.66 ng/mL — ABNORMAL HIGH (ref <0.50)
Phencyclidine: NEGATIVE ng/mL (ref <10)
Tapentadol: NEGATIVE ng/mL (ref <5.0)
Temazepam: 0.81 ng/mL — ABNORMAL HIGH (ref <0.50)
Tramadol: NEGATIVE ng/mL (ref <5.0)
Triazolam: NEGATIVE ng/mL (ref <0.50)
Zolpidem: NEGATIVE ng/mL (ref <5.0)

## 2021-06-21 LAB — VITAMIN D 1,25 DIHYDROXY
Vitamin D 1, 25 (OH)2 Total: 48 pg/mL (ref 18–72)
Vitamin D2 1, 25 (OH)2: <8 pg/mL
Vitamin D3 1, 25 (OH)2: 48 pg/mL

## 2021-06-21 LAB — HEPATITIS C ANTIBODY
Hepatitis C Ab: NONREACTIVE
SIGNAL TO CUT-OFF: 0.04 (ref <1.00)

## 2021-06-21 LAB — HIV ANTIBODY (ROUTINE TESTING W REFLEX): HIV 1&2 Ab, 4th Generation: NONREACTIVE

## 2021-06-21 LAB — RPR: RPR Ser Ql: NONREACTIVE

## 2021-07-15 ENCOUNTER — Encounter: Payer: Self-pay | Admitting: Family Medicine

## 2021-07-19 ENCOUNTER — Telehealth: Payer: Self-pay | Admitting: Family Medicine

## 2021-07-19 NOTE — Telephone Encounter (Signed)
Patient stated she wanted the medication to be ready in an hour due to her household being sick and she did not want to make two trips to the store. She was informed that med refills take 24-48 hours to be processed.  Medication: methylphenidate (RITALIN) 20 MG tablet [[id number]]   Has the patient contacted their pharmacy? No. (If no, request that the patient contact the pharmacy for the refill.) (If yes, when and what did the pharmacy advise?)  Preferred Pharmacy (with phone number or street name): [facility] ([address]), [place]  [address] [place]) [place] [address]  Phone:  [phone]  Fax:  [phone]  Agent: Please be advised that RX refills may take up to 3 business days. We ask that you follow-up with your pharmacy.

## 2021-07-19 NOTE — Telephone Encounter (Signed)
Requesting: methyphenidate 20mg  Contract:  UDS:  06/17/21 Last Visit: 06/17/21 Next Visit: none Last Refill: 05/28/21  Please Advise

## 2021-07-21 ENCOUNTER — Other Ambulatory Visit: Payer: Self-pay | Admitting: Family Medicine

## 2021-07-21 DIAGNOSIS — R41840 Attention and concentration deficit: Secondary | ICD-10-CM

## 2021-07-21 MED ORDER — METHYLPHENIDATE HCL 20 MG PO TABS: 20.0000 mg | ORAL_TABLET | Freq: Three times a day (TID) | ORAL | 0 refills | Status: DC

## 2021-07-24 ENCOUNTER — Other Ambulatory Visit: Payer: Self-pay | Admitting: Family Medicine

## 2021-07-24 DIAGNOSIS — R41840 Attention and concentration deficit: Secondary | ICD-10-CM

## 2021-07-24 MED ORDER — METHYLPHENIDATE HCL 20 MG PO TABS: 20.0000 mg | ORAL_TABLET | Freq: Three times a day (TID) | ORAL | 0 refills | Status: DC

## 2021-07-24 NOTE — Telephone Encounter (Signed)
Patient is calling back stating she needs to know what medication she's going to take since her Methylphenidate has been on back order. Seh states she needs it before the holidays. Please advice.

## 2021-07-24 NOTE — Telephone Encounter (Signed)
Patient said she spoke with someone on yesterday (I do not see any documentation).  [facility] states that r[facility]in is on back order.  I asked did [facility] that she sent in the message from today and she stated that she did not know, she was just putting down something.  She was a little upset and bothered because this issue has not been taken care and she has to call out of work tonight because of no medication.

## 2021-07-29 ENCOUNTER — Encounter: Payer: Self-pay | Admitting: Family Medicine

## 2021-08-01 ENCOUNTER — Other Ambulatory Visit: Payer: Self-pay | Admitting: Family Medicine

## 2021-08-03 ENCOUNTER — Other Ambulatory Visit: Payer: Self-pay | Admitting: Family Medicine

## 2021-08-03 MED ORDER — METHYLPHENIDATE HCL 10 MG PO TABS: 10.0000 mg | ORAL_TABLET | Freq: Every day | ORAL | 0 refills | Status: DC | PRN

## 2021-08-03 MED ORDER — METHYLPHENIDATE HCL ER (OSM) 36 MG PO TBCR: 36.0000 mg | EXTENDED_RELEASE_TABLET | Freq: Every day | ORAL | 0 refills | Status: DC

## 2021-08-06 ENCOUNTER — Other Ambulatory Visit: Payer: Self-pay | Admitting: Family Medicine

## 2021-08-06 MED ORDER — METHYLPHENIDATE HCL 10 MG PO TABS: 10.0000 mg | ORAL_TABLET | Freq: Every day | ORAL | 0 refills | Status: DC | PRN

## 2021-08-06 MED ORDER — TIZANIDINE HCL 2 MG PO TABS: 1.0000 mg | ORAL_TABLET | Freq: Three times a day (TID) | ORAL | 2 refills | Status: DC | PRN

## 2021-09-09 ENCOUNTER — Telehealth: Payer: Medicaid Other | Admitting: Emergency Medicine

## 2021-09-09 DIAGNOSIS — N764 Abscess of vulva: Secondary | ICD-10-CM

## 2021-09-09 MED ORDER — DOXYCYCLINE HYCLATE 100 MG PO TABS: 100.0000 mg | ORAL_TABLET | Freq: Two times a day (BID) | ORAL | 0 refills | Status: DC

## 2021-09-09 NOTE — Progress Notes (Addendum)
E Visit for Rash  We are sorry that you are not feeling well. Here is how we plan to help!  Based on what you have shared with me, it looks like your infected hair follicle has turned into an abscess. I have prescribed an antibiotic called doxycycline 100mg  twice a day for 7 days.   Your abscess may need to be cut open and the pus drained out.  We can start with antibiotics and frequent warm compresses to the area instead, however if this treatment plan does not significantly improve your symptoms, you will need to be seen in person such as at your primary care provider or at an urgent care.  HOME CARE:  Use warm compresses using a clean washcloth several times a day.  Alternatively, you can soak in a warm bath several times a day.   GET HELP RIGHT AWAY IF:  Symptoms don't go away after treatment. Symptoms do not start improving in 1 to 2 days  MAKE SURE YOU:  Understand these instructions. Will watch your condition. Will get help right away if you are not doing well or get worse.  Thank you for choosing an e-visit.  Your e-visit answers were reviewed by a board certified advanced clinical practitioner to complete your personal care plan. Depending upon the condition, your plan could have included both over the counter or prescription medications.  Please review your pharmacy choice. Make sure the pharmacy is open so you can pick up prescription now. If there is a problem, you may contact your provider through [employer] and have the prescription routed to another pharmacy.  Your safety is important to [place]. If you have drug allergies check your prescription carefully.   For the next 24 hours you can use MyChart to ask questions about today's visit, request a non-urgent call back, or ask for a work or school excuse. You will get an email in the next two days asking about your experience. I hope that your e-visit has been valuable and will speed your recovery.  I have spent 5  minutes in review of e-visit questionnaire, review and updating patient chart, medical decision making and response to patient.   Willeen Cass, PhD, FNP-BC

## 2021-09-26 ENCOUNTER — Other Ambulatory Visit: Payer: Self-pay | Admitting: Family Medicine

## 2021-09-30 ENCOUNTER — Other Ambulatory Visit: Payer: Self-pay | Admitting: Family Medicine

## 2021-09-30 ENCOUNTER — Encounter: Payer: Self-pay | Admitting: Family Medicine

## 2021-09-30 MED ORDER — METHYLPHENIDATE HCL 10 MG PO TABS: 10.0000 mg | ORAL_TABLET | Freq: Every day | ORAL | 0 refills | Status: DC | PRN

## 2021-09-30 MED ORDER — METHYLPHENIDATE HCL ER (OSM) 36 MG PO TBCR: 36.0000 mg | EXTENDED_RELEASE_TABLET | Freq: Every day | ORAL | 0 refills | Status: DC

## 2021-10-01 ENCOUNTER — Telehealth: Payer: Self-pay

## 2021-10-01 NOTE — Telephone Encounter (Signed)
PA initiated via Covermymeds; KEY: [id number]. Awaiting determination.

## 2021-10-02 NOTE — Telephone Encounter (Signed)
Fax for further information received. Updated original PA in Covermymeds and resent.

## 2021-10-07 NOTE — Telephone Encounter (Signed)
PA denied. See denial letter with Inda [employer].

## 2021-10-08 ENCOUNTER — Telehealth: Payer: Self-pay | Admitting: Family Medicine

## 2021-10-08 NOTE — Telephone Encounter (Signed)
Patient states all [facility] are out of meds except pharmacy below.  Patient needs meds transferred  methylphenidate (RITALIN) 10 MG tablet [[id number]]  methylphenidate (CONCERTA) 36 MG PO CR tablet [[id number]]   [facility]  [place], [place] [address] [phone]

## 2021-10-10 ENCOUNTER — Other Ambulatory Visit: Payer: Self-pay

## 2021-10-10 ENCOUNTER — Other Ambulatory Visit: Payer: Self-pay | Admitting: Family Medicine

## 2021-10-10 MED ORDER — METHYLPHENIDATE HCL ER (OSM) 36 MG PO TBCR: 36.0000 mg | EXTENDED_RELEASE_TABLET | Freq: Every day | ORAL | 0 refills | Status: DC

## 2021-10-10 NOTE — Telephone Encounter (Signed)
done

## 2021-10-16 ENCOUNTER — Emergency Department (HOSPITAL_COMMUNITY): Payer: PRIVATE HEALTH INSURANCE

## 2021-10-16 ENCOUNTER — Other Ambulatory Visit: Payer: Self-pay

## 2021-10-16 ENCOUNTER — Emergency Department (HOSPITAL_COMMUNITY)
Admission: EM | Admit: 2021-10-16 | Discharge: 2021-10-16 | Disposition: A | Discharge status: Home / Self Care | Payer: PRIVATE HEALTH INSURANCE | Attending: Emergency Medicine | Admitting: Emergency Medicine

## 2021-10-16 DIAGNOSIS — M549 Dorsalgia, unspecified: Secondary | ICD-10-CM | POA: Insufficient documentation

## 2021-10-16 DIAGNOSIS — Y92410 Unspecified street and highway as the place of occurrence of the external cause: Secondary | ICD-10-CM | POA: Insufficient documentation

## 2021-10-16 DIAGNOSIS — T1490XA Injury, unspecified, initial encounter: Secondary | ICD-10-CM

## 2021-10-16 DIAGNOSIS — M542 Cervicalgia: Secondary | ICD-10-CM | POA: Insufficient documentation

## 2021-10-16 DIAGNOSIS — S0990XA Unspecified injury of head, initial encounter: Secondary | ICD-10-CM

## 2021-10-16 DIAGNOSIS — S0081XA Abrasion of other part of head, initial encounter: Secondary | ICD-10-CM | POA: Insufficient documentation

## 2021-10-16 DIAGNOSIS — R519 Headache, unspecified: Secondary | ICD-10-CM | POA: Diagnosis not present

## 2021-10-16 DIAGNOSIS — Z23 Encounter for immunization: Secondary | ICD-10-CM | POA: Insufficient documentation

## 2021-10-16 DIAGNOSIS — R079 Chest pain, unspecified: Secondary | ICD-10-CM | POA: Insufficient documentation

## 2021-10-16 DIAGNOSIS — S51811A Laceration without foreign body of right forearm, initial encounter: Secondary | ICD-10-CM | POA: Insufficient documentation

## 2021-10-16 DIAGNOSIS — R109 Unspecified abdominal pain: Secondary | ICD-10-CM | POA: Insufficient documentation

## 2021-10-16 DIAGNOSIS — S59911A Unspecified injury of right forearm, initial encounter: Secondary | ICD-10-CM | POA: Diagnosis present

## 2021-10-16 LAB — COMPREHENSIVE METABOLIC PANEL
ALT: 25 U/L (ref 0–44)
AST: 28 U/L (ref 15–41)
Albumin: 3.6 g/dL (ref 3.5–5.0)
Alkaline Phosphatase: 61 U/L (ref 38–126)
Anion gap: 11 (ref 5–15)
BUN: 7 mg/dL (ref 6–20)
CO2: 24 mmol/L (ref 22–32)
Calcium: 8.8 mg/dL — ABNORMAL LOW (ref 8.9–10.3)
Chloride: 105 mmol/L (ref 98–111)
Creatinine, Ser: 0.79 mg/dL (ref 0.44–1.00)
GFR, Estimated: >60 mL/min (ref >60)
Glucose, Bld: 114 mg/dL — ABNORMAL HIGH (ref 70–99)
Potassium: 4.2 mmol/L (ref 3.5–5.1)
Sodium: 140 mmol/L (ref 135–145)
Total Bilirubin: 0.2 mg/dL — ABNORMAL LOW (ref 0.3–1.2)
Total Protein: 7.2 g/dL (ref 6.5–8.1)

## 2021-10-16 LAB — I-STAT CHEM 8, ED
BUN: 8 mg/dL (ref 6–20)
Calcium, Ion: 1.09 mmol/L — ABNORMAL LOW (ref 1.15–1.40)
Chloride: 104 mmol/L (ref 98–111)
Creatinine, Ser: 0.7 mg/dL (ref 0.44–1.00)
Glucose, Bld: 114 mg/dL — ABNORMAL HIGH (ref 70–99)
HCT: 41 % (ref 36.0–46.0)
Hemoglobin: 13.9 g/dL (ref 12.0–15.0)
Potassium: 4.1 mmol/L (ref 3.5–5.1)
Sodium: 141 mmol/L (ref 135–145)
TCO2: 25 mmol/L (ref 22–32)

## 2021-10-16 LAB — CBC
HCT: 41.4 % (ref 36.0–46.0)
Hemoglobin: 12.9 g/dL (ref 12.0–15.0)
MCH: 25.1 pg — ABNORMAL LOW (ref 26.0–34.0)
MCHC: 31.2 g/dL (ref 30.0–36.0)
MCV: 80.7 fL (ref 80.0–100.0)
Platelets: 376 10*3/uL (ref 150–400)
RBC: 5.13 MIL/uL — ABNORMAL HIGH (ref 3.87–5.11)
RDW: 16.5 % — ABNORMAL HIGH (ref 11.5–15.5)
WBC: 8.7 10*3/uL (ref 4.0–10.5)
nRBC: 0 % (ref 0.0–0.2)

## 2021-10-16 LAB — ETHANOL: Alcohol, Ethyl (B): <10 mg/dL (ref <10)

## 2021-10-16 LAB — PROTIME-INR
INR: 0.9 (ref 0.8–1.2)
Prothrombin Time: 12.6 seconds (ref 11.4–15.2)

## 2021-10-16 LAB — I-STAT BETA HCG BLOOD, ED (MC, WL, AP ONLY): I-stat hCG, quantitative: <5 m[IU]/mL (ref <5)

## 2021-10-16 LAB — LACTIC ACID, PLASMA: Lactic Acid, Venous: 2.1 mmol/L (ref 0.5–1.9)

## 2021-10-16 LAB — SAMPLE TO BLOOD BANK

## 2021-10-16 MED ORDER — ONDANSETRON HCL 4 MG PO TABS: 4.0000 mg | ORAL_TABLET | Freq: Three times a day (TID) | ORAL | 0 refills | Status: DC | PRN

## 2021-10-16 MED ORDER — IOHEXOL 300 MG/ML  SOLN
100.0000 mL | Freq: Once | INTRAMUSCULAR | Status: AC | PRN
  Administered 2021-10-16: 100 mL via INTRAVENOUS

## 2021-10-16 MED ORDER — TETANUS-DIPHTH-ACELL PERTUSSIS 5-2.5-18.5 LF-MCG/0.5 IM SUSY
0.5000 mL | PREFILLED_SYRINGE | Freq: Once | INTRAMUSCULAR | Status: AC
  Administered 2021-10-16: 0.5 mL via INTRAMUSCULAR
  Filled 2021-10-16: qty 0.5

## 2021-10-16 MED ORDER — ONDANSETRON 4 MG PO TBDP
4.0000 mg | ORAL_TABLET | Freq: Once | ORAL | Status: AC
  Administered 2021-10-16: 4 mg via ORAL
  Filled 2021-10-16: qty 1

## 2021-10-16 MED ORDER — METHYLPHENIDATE HCL ER (OSM) 36 MG PO TBCR
36.0000 mg | EXTENDED_RELEASE_TABLET | Freq: Every day | ORAL | 0 refills | Status: DC
Start: 2021-10-16 — End: 2021-10-25

## 2021-10-16 MED ORDER — LIDOCAINE-EPINEPHRINE (PF) 2 %-1:200000 IJ SOLN
20.0000 mL | Freq: Once | INTRAMUSCULAR | Status: AC
  Administered 2021-10-16: 20 mL
  Filled 2021-10-16: qty 20

## 2021-10-16 MED ORDER — IBUPROFEN 800 MG PO TABS: 800.0000 mg | ORAL_TABLET | Freq: Three times a day (TID) | ORAL | 0 refills | Status: DC
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
## 2021-10-16 NOTE — ED Notes (Signed)
Pt refused to be swabbed for COVID.

## 2021-10-16 NOTE — ED Notes (Signed)
Mother Tama Grosz [phone] would like an update asap

## 2021-10-16 NOTE — ED Notes (Signed)
The pts face and her rt forearm was cleaned with peroxide and water bandaged with a cling bandage
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
## 2021-10-16 NOTE — ED Notes (Signed)
To ct

## 2021-10-16 NOTE — ED Notes (Signed)
The pt refused the covid swab
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
## 2021-10-16 NOTE — ED Notes (Signed)
Facial cuts cleaned with  peroxide and water  bacitracin oint placed on her face cuts and abrasions

## 2021-10-16 NOTE — ED Notes (Signed)
Portable xrYS COMPLETED   pt upset

## 2021-10-16 NOTE — Telephone Encounter (Signed)
Can you resend this to a [facility]  on [address], [place]

## 2021-10-17 LAB — URINALYSIS, ROUTINE W REFLEX MICROSCOPIC
Bilirubin Urine: NEGATIVE
Glucose, UA: NEGATIVE mg/dL
Ketones, ur: NEGATIVE mg/dL
Leukocytes,Ua: NEGATIVE
Nitrite: NEGATIVE
Protein, ur: 30 mg/dL — AB
Specific Gravity, Urine: 1.012 (ref 1.005–1.030)
pH: 5 (ref 5.0–8.0)

## 2021-10-18 ENCOUNTER — Telehealth: Payer: Medicaid Other | Admitting: Physician Assistant

## 2021-10-18 DIAGNOSIS — S139XXD Sprain of joints and ligaments of unspecified parts of neck, subsequent encounter: Secondary | ICD-10-CM

## 2021-10-18 DIAGNOSIS — S0081XD Abrasion of other part of head, subsequent encounter: Secondary | ICD-10-CM

## 2021-10-18 DIAGNOSIS — S41111D Laceration without foreign body of right upper arm, subsequent encounter: Secondary | ICD-10-CM

## 2021-10-18 DIAGNOSIS — M62838 Other muscle spasm: Secondary | ICD-10-CM

## 2021-10-18 DIAGNOSIS — S0003XD Contusion of scalp, subsequent encounter: Secondary | ICD-10-CM

## 2021-10-18 MED ORDER — MUPIROCIN 2 % EX OINT: 1.0000 "application " | TOPICAL_OINTMENT | Freq: Two times a day (BID) | CUTANEOUS | 0 refills | Status: DC

## 2021-10-18 MED ORDER — NAPROXEN SODIUM 550 MG PO TABS: 550.0000 mg | ORAL_TABLET | Freq: Two times a day (BID) | ORAL | 0 refills | Status: DC

## 2021-10-18 MED ORDER — CYCLOBENZAPRINE HCL 10 MG PO TABS: 10.0000 mg | ORAL_TABLET | Freq: Three times a day (TID) | ORAL | 0 refills | Status: DC | PRN

## 2021-10-18 NOTE — Patient Instructions (Signed)
[name], thank you for joining Mar Daring, PA-C for today's virtual visit.  While this provider is not your primary care provider (PCP), if your PCP is located in our provider database this encounter information will be shared with them immediately following your visit.  Consent: (Patient) Melissa [name] provided verbal consent for this virtual visit at the beginning of the encounter.  Current Medications:  Current Outpatient Medications:    cyclobenzaprine (FLEXERIL) 10 MG tablet, Take 1 tablet (10 mg total) by mouth 3 (three) times daily as needed for muscle spasms., Disp: 30 tablet, Rfl: 0   mupirocin ointment (BACTROBAN) 2 %, Apply 1 application topically 2 (two) times daily., Disp: 22 g, Rfl: 0   naproxen sodium (ANAPROX DS) 550 MG tablet, Take 1 tablet (550 mg total) by mouth 2 (two) times daily with a meal., Disp: 30 tablet, Rfl: 0   albuterol (VENTOLIN HFA) 108 (90 Base) MCG/ACT inhaler, Inhale 1-2 puffs into the lungs every 6 (six) hours as needed for wheezing or shortness of breath., Disp: 18 g, Rfl: 2   busPIRone (BUSPAR) 15 MG tablet, Take 1 tablet (15 mg total) by mouth 3 (three) times daily., Disp: 90 tablet, Rfl: 3   diazepam (VALIUM) 10 MG tablet, TAKE 1 TABLET BY MOUTH EVERY 12 HOURS AS NEEDED FOR ANXIETY, Disp: 60 tablet, Rfl: 0   doxycycline (VIBRA-TABS) 100 MG tablet, Take 1 tablet (100 mg total) by mouth 2 (two) times daily., Disp: 14 tablet, Rfl: 0   famotidine (PEPCID) 40 MG tablet, Take 1 tablet (40 mg total) by mouth daily., Disp: 30 tablet, Rfl: 3   methylphenidate (CONCERTA) 36 MG PO CR tablet, Take 1 tablet (36 mg total) by mouth daily. February 2023, Disp: 30 tablet, Rfl: 0   ondansetron (ZOFRAN) 4 MG tablet, Take 1 tablet (4 mg total) by mouth every 8 (eight) hours as needed for nausea or vomiting., Disp: 15 tablet, Rfl: 0   venlafaxine XR (EFFEXOR-XR) 150 MG 24 hr capsule, Take 1 capsule (150 mg total) by mouth daily with breakfast., Disp: 30  capsule, Rfl: 3   Medications ordered in this encounter:  Meds ordered this encounter  Medications   cyclobenzaprine (FLEXERIL) 10 MG tablet    Sig: Take 1 tablet (10 mg total) by mouth 3 (three) times daily as needed for muscle spasms.    Dispense:  30 tablet    Refill:  0    Order Specific Question:   Supervising Provider    Answer:   Sabra Heck, BRIAN [[id number]]   mupirocin ointment (BACTROBAN) 2 %    Sig: Apply 1 application topically 2 (two) times daily.    Dispense:  22 g    Refill:  0    Order Specific Question:   Supervising Provider    Answer:   MILLER, BRIAN [[id number]]   naproxen sodium (ANAPROX DS) 550 MG tablet    Sig: Take 1 tablet (550 mg total) by mouth 2 (two) times daily with a meal.    Dispense:  30 tablet    Refill:  0    Order Specific Question:   Supervising Provider    Answer:   Noemi Chapel [[id number]]     *If you need refills on other medications prior to your next appointment, please contact your pharmacy*  Follow-Up: Call back or seek an in-person evaluation if the symptoms worsen or if the condition fails to improve as anticipated.  Other Instructions Cervical Sprain A cervical sprain is a stretch or tear in one  or more of the ligaments in the neck. Ligaments are the tissues that connect bones. Cervical sprains can range from mild to severe. Severe cervical sprains can cause the spinal bones (vertebrae) in the neck to be unstable. This can result in spinal cord damage and in serious nervous system problems. The time that it takes for a cervical sprain to heal depends on the cause and extent of the injury. Most cervical sprains heal in 4-6 weeks. What are the causes? Cervical sprains may be caused by trauma, such as an injury from a motor vehicle accident, a fall, or a sudden forward and backward whipping movement of the head and neck (whiplash injury). Mild cervical sprains may be caused by wear and tear over time. What increases the risk? The following factors may  make you more likely to develop this condition: Participating in activities that have a high risk of trauma to the neck. These include contact sports, auto racing, gymnastics, and diving. Taking risks when driving or riding in a motor vehicle. Osteoarthritis of the spine. Poor strength and flexibility of the neck. A previous neck injury. Poor posture. Spending long periods in certain positions that put stress on the neck, such as sitting at a computer for a long time. What are the signs or symptoms? Symptoms of this condition include: Pain, soreness, stiffness, tenderness, swelling, or a burning sensation in the front, back, or sides of the neck, shoulders, or upper back. Sudden tightening of neck muscles (spasms). Limited ability to move the neck. Headache. Dizziness. Nausea or vomiting. Weakness, numbness, or tingling in a hand or an arm. Symptoms may develop right away after injury, or they may develop over a few days. In some cases, symptoms may go away with treatment and return (recur) over time. How is this diagnosed? This condition may be diagnosed based on: Your medical history. Your symptoms. Any recent injuries or known neck problems that you have, such as arthritis in the neck. A physical exam. Imaging tests, such as X-rays, MRI, and CT scan. How is this treated? This condition is treated by resting and icing the injured area and doing physical therapy exercises. Heat therapy may be used 2-3 days after the injury occurred if there is no swelling. Depending on the severity of your condition, treatment may also include: Keeping your neck in place (immobilized) for periods of time. This may be done using: A cervical collar. This supports your chin and the back of your head. A cervical traction device. This is a sling that holds up your head. The device removes weight and pressure from your neck, and it may help to relieve pain. Medicines that help to relieve pain and  inflammation. Medicines that help to relax your muscles (muscle relaxants). Surgery. This is rare. Follow these instructions at home: Medicines  Take over-the-counter and prescription medicines only as told by your health care provider. Ask your health care provider if the medicine prescribed to you: Requires you to avoid driving or using heavy machinery. Can cause constipation. You may need to take these actions to prevent or treat constipation: Drink enough fluid to keep your urine pale yellow. Take over-the-counter or prescription medicines. Eat foods that are high in fiber, such as beans, whole grains, and fresh fruits and vegetables. Limit foods that are high in fat and processed sugars, such as fried or sweet foods. If you have a cervical collar: Wear the collar as told by your health care provider. Do not remove it unless told. Ask before  making any adjustments to your collar. If you have long hair, keep it outside of the collar. Ask your health care provider if you may remove the collar for cleaning and bathing. If so: Follow instructions about how to remove it safely. Clean it by hand with mild soap and water and air-dry it completely. If your collar has removable pads, remove them every 1-2 days and wash them by hand with soap and water. Let them air-dry completely before putting them back in the collar. Tell your health care provider if your skin under the collar has irritation or sores. Managing pain, stiffness, and swelling   If directed, use a cervical traction device as told. If directed, put ice on the affected area. To do this: Put ice in a plastic bag. Place a towel between your skin and the bag. Leave the ice on for 20 minutes, 2-3 times a day. If directed, apply heat to the affected area before you do your physical therapy or as often as told by your health care provider. Use the heat source that your health care provider recommends, such as a moist heat pack or a  heating pad. Place a towel between your skin and the heat source. Leave the heat on for 20-30 minutes. Remove the heat if your skin turns bright red. This is especially important if you are unable to feel pain, heat, or cold. You may have a greater risk of getting burned. Activity Do not drive while wearing a cervical collar. If you do not have a cervical collar, ask if it is safe to drive while your neck heals. Do not lift anything that is heavier than 10 lb (4.5 kg), or the limit that you are told, until your health care provider says that it is safe. Rest as told by your health care provider. If physical therapy was prescribed, do exercises as told by your health care provider or physical therapist. Return to your normal activities as told by your health care provider. Avoid positions and activities that make your symptoms worse. Ask your health care provider what activities are safe for you. General instructions Do not use any products that contain nicotine or tobacco, such as cigarettes, e-cigarettes, and chewing tobacco. These can delay healing. If you need help quitting, ask your health care provider. Keep all follow-up visits as told by your health care provider or physical therapist. This is important. How is this prevented? To prevent a cervical sprain from happening again: Use and maintain good posture. Make any needed adjustments to your workstation to help you do this. Exercise regularly as told by your health care provider or physical therapist. Avoid risky activities that may cause a cervical sprain. Contact a health care provider if you have: Symptoms that get worse or do not get better after 2 weeks of treatment. Pain that gets worse or does not get better with medicine. New, unexplained symptoms. Sores or irritated skin on your neck from wearing your cervical collar. Get help right away if: You have severe pain. You develop numbness, tingling, or weakness in any part of  your body. You cannot move a part of your body (you have paralysis). You have neck pain along with severe dizziness or headache. Summary A cervical sprain is a stretch or tear in one or more of the ligaments in the neck. Cervical sprains may be caused by trauma, such as an injury from a motor vehicle accident, a fall, or a sudden forward and backward whipping movement of the  head and neck (whiplash injury). Symptoms may develop right away after injury, or they may develop over a few days. This condition may be treated with rest, ice, heat, medicines, physical therapy, and surgery. This information is not intended to replace advice given to you by your health care provider. Make sure you discuss any questions you have with your health care provider. Document Revised: 04/27/2019 Document Reviewed: 04/27/2019 Elsevier Patient Education  2022 [place].   Cervical Strain and Sprain Rehab Ask your health care provider which exercises are safe for you. Do exercises exactly as told by your health care provider and adjust them as directed. It is normal to feel mild stretching, pulling, tightness, or discomfort as you do these exercises. Stop right away if you feel sudden pain or your pain gets worse. Do not begin these exercises until told by your health care provider. Stretching and range-of-motion exercises Cervical side bending  Using good posture, sit on a stable chair or stand up. Without moving your shoulders, slowly tilt your left / right ear to your shoulder until you feel a stretch in the opposite side neck muscles. You should be looking straight ahead. Hold for __________ seconds. Repeat with the other side of your neck. Repeat __________ times. Complete this exercise __________ times a day. Cervical rotation  Using good posture, sit on a stable chair or stand up. Slowly turn your head to the side as if you are looking over your left / right shoulder. Keep your eyes level with the  ground. Stop when you feel a stretch along the side and the back of your neck. Hold for __________ seconds. Repeat this by turning to your other side. Repeat __________ times. Complete this exercise __________ times a day. Thoracic extension and pectoral stretch Roll a towel or a small blanket so it is about 4 inches (10 cm) in diameter. Lie down on your back on a firm surface. Put the towel lengthwise, under your spine in the middle of your back. It should not be under your shoulder blades. The towel should line up with your spine from your middle back to your lower back. Put your hands behind your head and let your elbows fall out to your sides. Hold for __________ seconds. Repeat __________ times. Complete this exercise __________ times a day. Strengthening exercises Isometric upper cervical flexion Lie on your back with a thin pillow behind your head and a small rolled-up towel under your neck. Gently tuck your chin toward your chest and nod your head down to look toward your feet. Do not lift your head off the pillow. Hold for __________ seconds. Release the tension slowly. Relax your neck muscles completely before you repeat this exercise. Repeat __________ times. Complete this exercise __________ times a day. Isometric cervical extension  Stand about 6 inches (15 cm) away from a wall, with your back facing the wall. Place a soft object, about 6-8 inches (15-20 cm) in diameter, between the back of your head and the wall. A soft object could be a small pillow, a ball, or a folded towel. Gently tilt your head back and press into the soft object. Keep your jaw and forehead relaxed. Hold for __________ seconds. Release the tension slowly. Relax your neck muscles completely before you repeat this exercise. Repeat __________ times. Complete this exercise __________ times a day. Posture and body mechanics Body mechanics refers to the movements and positions of your body while you do your  daily activities. Posture is part of body  mechanics. Good posture and healthy body mechanics can help to relieve stress in your body's tissues and joints. Good posture means that your spine is in its natural S-curve position (your spine is neutral), your shoulders are pulled back slightly, and your head is not tipped forward. The following are general guidelines for applying improved posture and body mechanics to your everyday activities. Sitting  When sitting, keep your spine neutral and keep your feet flat on the floor. Use a footrest, if necessary, and keep your thighs parallel to the floor. Avoid rounding your shoulders, and avoid tilting your head forward. When working at a desk or a computer, keep your desk at a height where your hands are slightly lower than your elbows. Slide your chair under your desk so you are close enough to maintain good posture. When working at a computer, place your monitor at a height where you are looking straight ahead and you do not have to tilt your head forward or downward to look at the screen. Standing  When standing, keep your spine neutral and keep your feet about hip-width apart. Keep a slight bend in your knees. Your ears, shoulders, and hips should line up. When you do a task in which you stand in one place for a long time, place one foot up on a stable object that is 2-4 inches (5-10 cm) high, such as a footstool. This helps keep your spine neutral. Resting When lying down and resting, avoid positions that are most painful for you. Try to support your neck in a neutral position. You can use a contour pillow or a small rolled-up towel. Your pillow should support your neck but not push on it. This information is not intended to replace advice given to you by your health care provider. Make sure you discuss any questions you have with your health care provider. Document Revised: 12/08/2018 Document Reviewed: 05/19/2018 Elsevier Patient Education  2022  [employer].    If you have been instructed to have an in-person evaluation today at a local Urgent Care facility, please use the link below. It will take you to a list of all of our available [facility], including address, phone number and hours of operation. Please do not delay care.  [facility]  If you or a family member do not have a primary care provider, use the link below to schedule a visit and establish care. When you choose a [facility] primary care physician or advanced practice provider, you gain a long-term partner in health. Find a Primary Care Provider  Learn more about [facility]'s in-office and virtual care options: [facility]
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
## 2021-10-24 ENCOUNTER — Telehealth: Payer: Self-pay

## 2021-10-24 ENCOUNTER — Other Ambulatory Visit: Payer: Self-pay

## 2021-10-24 ENCOUNTER — Emergency Department (HOSPITAL_BASED_OUTPATIENT_CLINIC_OR_DEPARTMENT_OTHER)
Admission: EM | Admit: 2021-10-24 | Discharge: 2021-10-24 | Disposition: A | Discharge status: Home / Self Care | Payer: PRIVATE HEALTH INSURANCE | Attending: Emergency Medicine | Admitting: Emergency Medicine

## 2021-10-24 ENCOUNTER — Encounter (HOSPITAL_BASED_OUTPATIENT_CLINIC_OR_DEPARTMENT_OTHER): Payer: Self-pay | Admitting: Emergency Medicine

## 2021-10-24 DIAGNOSIS — J45909 Unspecified asthma, uncomplicated: Secondary | ICD-10-CM | POA: Diagnosis not present

## 2021-10-24 DIAGNOSIS — S8012XA Contusion of left lower leg, initial encounter: Secondary | ICD-10-CM | POA: Insufficient documentation

## 2021-10-24 DIAGNOSIS — S8992XA Unspecified injury of left lower leg, initial encounter: Secondary | ICD-10-CM | POA: Diagnosis present

## 2021-10-24 MED ORDER — HYDROCODONE-ACETAMINOPHEN 5-325 MG PO TABS
1.0000 | ORAL_TABLET | ORAL | 0 refills | Status: DC | PRN
Start: 2021-10-24 — End: 2022-02-04

## 2021-10-24 NOTE — ED Notes (Signed)
Ace wrap x2 applied to upper left leg. Pt refused crutches.

## 2021-10-24 NOTE — Telephone Encounter (Signed)
Pt now requesting methylphenidate 36mg  to [facility] on [address].
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
## 2021-10-24 NOTE — Telephone Encounter (Signed)
Patient states the [facility] does not have her Concerta medication. She would like for it to be sent to:  [facility], [address], [place] [address]  Phone:  [phone]  Fax:  [phone]

## 2021-10-25 ENCOUNTER — Other Ambulatory Visit: Payer: Self-pay | Admitting: Family Medicine

## 2021-10-25 ENCOUNTER — Telehealth: Payer: Self-pay

## 2021-10-25 ENCOUNTER — Other Ambulatory Visit: Payer: Self-pay

## 2021-10-25 MED ORDER — METHYLPHENIDATE HCL ER (OSM) 36 MG PO TBCR: 36.0000 mg | EXTENDED_RELEASE_TABLET | Freq: Every day | ORAL | 0 refills | Status: DC

## 2021-10-25 NOTE — Telephone Encounter (Signed)
Medication was cancelled

## 2021-10-25 NOTE — Telephone Encounter (Signed)
Patient notified that rx has been sent into [facility]
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
## 2021-10-28 NOTE — Telephone Encounter (Signed)
Melissa [name] did you speak with this patient about this at the end of January?

## 2021-10-28 NOTE — Telephone Encounter (Signed)
Patient states that the pharmacy told her that insurance wont cover the methylphenidate (Concerta) medication due to it being for adolescents. She stated that a form needs to be filled out and sent to her insurance in order for them to cover it. Please advise.

## 2021-10-29 ENCOUNTER — Other Ambulatory Visit: Payer: Self-pay | Admitting: Family Medicine

## 2021-10-29 ENCOUNTER — Ambulatory Visit: Payer: Medicaid Other | Admitting: Sports Medicine

## 2021-10-29 MED ORDER — METHYLPHENIDATE HCL 20 MG PO TABS: 20.0000 mg | ORAL_TABLET | Freq: Three times a day (TID) | ORAL | 0 refills | Status: DC

## 2021-10-29 NOTE — Telephone Encounter (Signed)
Rx sent over to [facility].  Patient notified.
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
## 2021-10-29 NOTE — Addendum Note (Signed)
Addended by: Ames Coupe on: 10/29/2021 12:03 PM   Modules accepted: Orders

## 2021-11-16 ENCOUNTER — Other Ambulatory Visit: Payer: Self-pay | Admitting: Family Medicine

## 2021-11-16 ENCOUNTER — Other Ambulatory Visit: Payer: Self-pay | Admitting: Physician Assistant

## 2021-11-18 MED ORDER — DIAZEPAM 10 MG PO TABS: 10.0000 mg | ORAL_TABLET | Freq: Two times a day (BID) | ORAL | 0 refills | Status: DC | PRN

## 2021-11-18 MED ORDER — CYCLOBENZAPRINE HCL 10 MG PO TABS: 10.0000 mg | ORAL_TABLET | Freq: Three times a day (TID) | ORAL | 0 refills | Status: DC | PRN

## 2021-11-18 MED ORDER — NAPROXEN SODIUM 550 MG PO TABS: 550.0000 mg | ORAL_TABLET | Freq: Two times a day (BID) | ORAL | 0 refills | Status: DC

## 2021-11-18 NOTE — Telephone Encounter (Signed)
Requesting: diazepam  ?Contract:  ?UDS: 06/17/21 ?Last Visit:06/17/21 ?Next Visit: none ?Last Refill: 09/27/21 ? ?Please Advise ? ?

## 2021-11-26 MED ORDER — METHYLPHENIDATE HCL 20 MG PO TABS: 20.0000 mg | ORAL_TABLET | Freq: Three times a day (TID) | ORAL | 0 refills | Status: DC

## 2021-11-26 NOTE — Addendum Note (Signed)
Addended by: Legrand Rams on: 11/26/2021 11:27 AM ? ? Modules accepted: Orders ? ?

## 2021-11-26 NOTE — Telephone Encounter (Signed)
Requesting: Ritalin  ?Contract: none ?UDS: 06/17/21 ?Last Visit: 06/17/21  ?Next Visit: none pending- pt was advised to schedule ?Last Refill: 09/28/21 ? ?Please Advise ? ?

## 2021-11-28 ENCOUNTER — Other Ambulatory Visit: Payer: Self-pay | Admitting: Family Medicine

## 2021-12-09 ENCOUNTER — Other Ambulatory Visit: Payer: Self-pay | Admitting: Family Medicine

## 2021-12-09 MED ORDER — METHYLPHENIDATE HCL 20 MG PO TABS: 20.0000 mg | ORAL_TABLET | Freq: Three times a day (TID) | ORAL | 0 refills | Status: DC

## 2021-12-09 NOTE — Telephone Encounter (Signed)
Refill sent to [facility] on [facility]. Pharmacy will not have medication in stock until May. I have called and canceled the prescription. ? ?Please send to [facility]  ?[place], [place], [place] [address] ? ? ?Requesting: Ritalin  ?Contract: none ?UDS: 06/17/21 ?Last Visit: 06/17/21  ?Next Visit: none pending- pt was advised to schedule ?Last Refill: 09/28/21 ?  ?Please Advise ?

## 2021-12-09 NOTE — Telephone Encounter (Signed)
Pt is needing rx sent to a different pharmacy, as the pharmacy it was sent to will not have it in stock until May. They only have a small amount of rx and this will need to be sent as soon as possible. Please advise.  ? ?Medication: methylphenidate (RITALIN) 20 MG tablet  ? ?Has the patient contacted their pharmacy? Yes.   ? ? ?Preferred Pharmacy: [facility] ? ?[address], [place], [place] [address] ? ?[phone] ?

## 2021-12-30 ENCOUNTER — Other Ambulatory Visit: Payer: Self-pay | Admitting: Family Medicine

## 2021-12-30 MED ORDER — DIAZEPAM 10 MG PO TABS: 10.0000 mg | ORAL_TABLET | Freq: Two times a day (BID) | ORAL | 0 refills | Status: DC | PRN

## 2021-12-30 MED ORDER — METHYLPHENIDATE HCL 20 MG PO TABS: 20.0000 mg | ORAL_TABLET | Freq: Three times a day (TID) | ORAL | 0 refills | Status: DC

## 2021-12-30 NOTE — Telephone Encounter (Signed)
Requesting: Ritalin '20mg'$   ?Contract:06/17/21 ?UDS:06/17/21 ?Last Visit: 06/17/21 ?Next Visit: None ?Last Refill: 12/09/2021 #90 and 0RF ? ?Please Advise ? ?

## 2021-12-30 NOTE — Telephone Encounter (Signed)
Requesting: diazepam '10mg'$   ?Contract: 06/17/21 ?UDS: 06/17/21 ?Last Visit: 06/17/21 ?Next Visit: None ?Last Refill: 11/18/21 #60 and 0RF ? ?Please Advise ? ?

## 2022-01-02 NOTE — Telephone Encounter (Signed)
My chart message sent to pt.

## 2022-01-09 ENCOUNTER — Telehealth: Payer: Self-pay | Admitting: Family Medicine

## 2022-01-09 NOTE — Telephone Encounter (Signed)
Pt called to get her methylphenidate refilled. She was able to find a pharmacy to send it to since there are a lot of pharmacies currently out of this medication. ? ?Medication:  ? ?methylphenidate (RITALIN) 20 MG tablet [[id number]]  ? ?Has the patient contacted their pharmacy? Yes.   ?(If no, request that the patient contact the pharmacy for the refill.) ?(If yes, when and what did the pharmacy advise?) ? ?Preferred Pharmacy (with phone number or street name):  ? ?[facility] ?[address], [place], [place] [address] ?Phone: [phone] ? ?Agent: Please be advised that RX refills may take up to 3 business days. We ask that you follow-up with your pharmacy. ? ?

## 2022-01-09 NOTE — Telephone Encounter (Signed)
Pt needs to make an appointment. Dr. Charlett Blake cannot prescribe anymore until she has an appointment. Pt has been told this and responded to [employer]. Have sent another message to pt regarding this. ?

## 2022-01-10 NOTE — Telephone Encounter (Signed)
Patient states the medication was sent on 05/01 to a pharmacy that does not have it in stock, she needs it sent to [facility] on [address]. Please advise.  ?

## 2022-01-13 ENCOUNTER — Other Ambulatory Visit: Payer: Self-pay

## 2022-01-13 ENCOUNTER — Telehealth: Payer: Self-pay

## 2022-01-13 MED ORDER — METHYLPHENIDATE HCL 20 MG PO TABS: 20.0000 mg | ORAL_TABLET | Freq: Three times a day (TID) | ORAL | 0 refills | Status: DC

## 2022-01-13 NOTE — Telephone Encounter (Signed)
PA submitted for Ritalin 20 MG. Waiting on response from insurance company.  ?

## 2022-01-13 NOTE — Progress Notes (Signed)
Dr Charlett Blake sent in a refill on 5/1 to [facility], pt is unable to get it filled due to back order at that location. I have called and canceled the prescription.  ? ?Please send to [facility]. ? ? ?Requesting: Ritalin '20mg'$  ?Contract: none ?UDS: 06/17/21 ?Last Visit: 06/17/21 ?Next Visit: pt advised to schedule ?Last Refill: 12/30/21 ? ?Please Advise  ?

## 2022-02-04 ENCOUNTER — Ambulatory Visit (INDEPENDENT_AMBULATORY_CARE_PROVIDER_SITE_OTHER): Payer: PRIVATE HEALTH INSURANCE | Admitting: Family Medicine

## 2022-02-04 ENCOUNTER — Encounter: Payer: Self-pay | Admitting: Family Medicine

## 2022-02-04 VITALS — BP 124/83 | HR 98 | Ht 62.0 in

## 2022-02-04 DIAGNOSIS — F320 Major depressive disorder, single episode, mild: Secondary | ICD-10-CM

## 2022-02-04 DIAGNOSIS — F411 Generalized anxiety disorder: Secondary | ICD-10-CM | POA: Diagnosis not present

## 2022-02-04 DIAGNOSIS — F909 Attention-deficit hyperactivity disorder, unspecified type: Secondary | ICD-10-CM

## 2022-02-04 DIAGNOSIS — Z79899 Other long term (current) drug therapy: Secondary | ICD-10-CM | POA: Diagnosis not present

## 2022-02-04 MED ORDER — DIAZEPAM 10 MG PO TABS: 10.0000 mg | ORAL_TABLET | Freq: Two times a day (BID) | ORAL | 0 refills | Status: DC | PRN

## 2022-02-04 MED ORDER — BUSPIRONE HCL 15 MG PO TABS: 15.0000 mg | ORAL_TABLET | Freq: Three times a day (TID) | ORAL | 3 refills | Status: DC

## 2022-02-04 MED ORDER — METHYLPHENIDATE HCL 20 MG PO TABS: 20.0000 mg | ORAL_TABLET | Freq: Three times a day (TID) | ORAL | 0 refills | Status: DC

## 2022-02-04 MED ORDER — VENLAFAXINE HCL ER 75 MG PO CP24: 75.0000 mg | ORAL_CAPSULE | Freq: Every day | ORAL | 1 refills | Status: DC

## 2022-02-04 NOTE — Progress Notes (Signed)
Established Patient Office Visit  Subjective   Patient ID: Melissa [name], female    DOB: [birth year]  Age: 31 y.o. MRN: [id number]  CC: medication refills   HPI Patient is here for medication refill on ritalin, valium and buspar. She last saw PCP on 06/17/21 for CPE.   ADHD -  Ritalin 30-day supply was last sent in on 01/13/22. Reports this is working well at current dose. No new concerns. She does have side effects of increased anxiety when wearing off at the end of the day, but feels like it is well managed overall.  She would like to eventually get off of the Ritalin because of the side effects. She is agreeable to try weaning when she is done with school.    Anxiety/depression -  Primarily saving the Valium for nighttime use. It does seem to help pretty good.  Buspar TID is helping as well. No side effects.  Feels like depression is getting worse. She is seeing a therapist for counseling. Admits to never being consistent with daily medications in the past, because she did not notice any effect. Thinks she tolerated Effexor and Zoloft in the past, but never took for more than a week, so she never noticed a difference. She would like to try Effexor again.     Elevated HR and BP today - she was just drinking [employer] with 3 shots of espresso prior to appointment (reports it is a busy day with young son's graduation). She does have a mild headache right now. Patient denies any chest pain, palpitations, dyspnea, wheezing, edema, recurrent headaches, vision changes.       02/04/2022    2:00 PM 02/07/2021    4:38 PM 11/14/2019    3:11 PM  PHQ9 SCORE ONLY  PHQ-9 Total Score '21 21 24      '$ 02/04/2022    2:01 PM  GAD 7 : Generalized Anxiety Score  Nervous, Anxious, on Edge 3  Control/stop worrying 3  Worry too much - different things 3  Trouble relaxing 3  Restless 3  Easily annoyed or irritable 3  Afraid - awful might happen 3  Total GAD 7 Score 21  Anxiety Difficulty Not  difficult at all          ROS All review of systems negative except what is listed in the HPI    Objective:     BP 124/83   Pulse 98   Ht '5\' 2"'$  (1.575 m)   BMI 41.52 kg/m    Physical Exam Vitals reviewed.  Constitutional:      Appearance: Normal appearance. She is obese.  Cardiovascular:     Rate and Rhythm: Normal rate and regular rhythm.  Pulmonary:     Effort: Pulmonary effort is normal.     Breath sounds: Normal breath sounds.  Skin:    General: Skin is warm and dry.  Neurological:     General: No focal deficit present.     Mental Status: She is alert and oriented to person, place, and time. Mental status is at baseline.  Psychiatric:        Mood and Affect: Mood normal.        Behavior: Behavior normal.        Thought Content: Thought content normal.        Judgment: Judgment normal.     No results found for any visits on 02/04/22.    The ASCVD Risk score (Arnett DK, et al., 2019) failed to calculate for the  following reasons:   The 2019 ASCVD risk score is only valid for ages 51 to 27    Assessment & Plan:   1. GAD (generalized anxiety disorder) 2. Current mild episode of major depressive disorder, unspecified whether recurrent (HCC) Restarting Effexor and discussed significance of taking daily as it takes 4-6 weeks to reach full effect. You have tolerated this combination of medications well in the past. Information added to AVS. No SI/HI. UDS ordered today.  - venlafaxine XR (EFFEXOR XR) 75 MG 24 hr capsule; Take 1 capsule (75 mg total) by mouth daily with breakfast.  Dispense: 90 capsule; Refill: 1 - busPIRone (BUSPAR) 15 MG tablet; Take 1 tablet (15 mg total) by mouth 3 (three) times daily.  Dispense: 90 tablet; Refill: 3 - diazepam (VALIUM) 10 MG tablet; Take 1 tablet (10 mg total) by mouth every 12 (twelve) hours as needed. for anxiety  Dispense: 60 tablet; Refill: 0 - DRUG MONITORING, PANEL 8 WITH CONFIRMATION, URINE    3. Adult  ADHD Discussed safety and possibly starting to wean, especially on the days she doesn't have work/school. Refill sent in as future date based on PDMP review. BP/HR stabilized on recheck after resting in exam room for a few minutes. Patient aware of signs/symptoms requiring further/urgent evaluation.  - methylphenidate (RITALIN) 20 MG tablet; Take 1 tablet (20 mg total) by mouth 3 (three) times daily with meals.  Dispense: 90 tablet; Refill: 0 - DRUG MONITORING, PANEL 8 WITH CONFIRMATION, URINE  4. High risk medication use - DRUG MONITORING, PANEL 8 WITH CONFIRMATION, URINE    Return in about 6 weeks (around 03/18/2022) for mood f/u .    Terrilyn Saver, NP

## 2022-02-04 NOTE — Patient Instructions (Signed)
Restarting Effexor and discussed significance of taking daily as it takes 4-6 weeks to reach full effect. You have tolerated this combination of medications well in the past.  Taking the medicine as directed and not missing any doses is one of the best things you can do to treat your anxiety/depression.  Here are some things to keep in mind: Side effects (stomach upset, some increased anxiety) may happen before you notice a benefit.  These side effects typically go away over time. Changes to your dose of medicine or a change in medication all together is sometimes necessary Many people will notice an improvement within two weeks but the full effect of the medication can take up to 4-6 weeks Stopping the medication when you start feeling better often results in a return of symptoms. Most people need to be on medication at least 6-12 months If you start having thoughts of hurting yourself or others after starting this medicine, please call me immediately.

## 2022-02-05 NOTE — Telephone Encounter (Signed)
Call to check status on PA and Sonia Baller at in stated that the prior auth was closed stating that they do not have how often patient took the medication.  Case was reopened and information given.  Sonia Baller stated that we should receive and answer within 72 hours.  Case ref# [id number].

## 2022-02-06 LAB — DRUG MONITORING, PANEL 8 WITH CONFIRMATION, URINE
6 Acetylmorphine: NEGATIVE ng/mL (ref <10)
Alcohol Metabolites: NEGATIVE ng/mL (ref <500)
Alphahydroxyalprazolam: NEGATIVE ng/mL (ref <25)
Alphahydroxymidazolam: NEGATIVE ng/mL (ref <50)
Alphahydroxytriazolam: NEGATIVE ng/mL (ref <50)
Aminoclonazepam: NEGATIVE ng/mL (ref <25)
Amphetamines: NEGATIVE ng/mL (ref <500)
Benzodiazepines: POSITIVE ng/mL — AB (ref <100)
Buprenorphine, Urine: NEGATIVE ng/mL (ref <5)
Cocaine Metabolite: NEGATIVE ng/mL (ref <150)
Creatinine: 278.8 mg/dL (ref >=20.0)
Hydroxyethylflurazepam: NEGATIVE ng/mL (ref <50)
Lorazepam: NEGATIVE ng/mL (ref <50)
MDMA: NEGATIVE ng/mL (ref <500)
Marijuana Metabolite: NEGATIVE ng/mL (ref <20)
Nordiazepam: 2584 ng/mL — ABNORMAL HIGH (ref <50)
Opiates: NEGATIVE ng/mL (ref <100)
Oxazepam: 6214 ng/mL — ABNORMAL HIGH (ref <50)
Oxidant: NEGATIVE ug/mL (ref <200)
Oxycodone: NEGATIVE ng/mL (ref <100)
Temazepam: 6008 ng/mL — ABNORMAL HIGH (ref <50)
pH: 8.2 (ref 4.5–9.0)

## 2022-02-06 LAB — DM TEMPLATE

## 2022-02-13 NOTE — Telephone Encounter (Signed)
Insurance phone number [phone]

## 2022-02-19 ENCOUNTER — Encounter: Payer: Self-pay | Admitting: *Deleted

## 2022-02-19 NOTE — Telephone Encounter (Signed)
Call to follow up on prior auth and it was denied on 02/05/22.  Spoke with Lelan Pons at [employer] and she stated that she will fax over the denial letter.  Prior Josem Kaufmann was denied because questions that was not on cover my meds was not answered.  Will try an appeal.

## 2022-02-19 NOTE — Telephone Encounter (Signed)
Appeal letter faxed.

## 2022-02-26 NOTE — Telephone Encounter (Signed)
Spoke with Lovena Le at [employer] and appeal is still in review.

## 2022-03-06 NOTE — Telephone Encounter (Signed)
Spoke with insurance to follow up and they stated that we can reopen case with additional information.  Additional information given and will take up to 72 hours for review.  Case ID: [id number].

## 2022-03-07 ENCOUNTER — Other Ambulatory Visit: Payer: Self-pay | Admitting: Family Medicine

## 2022-03-07 DIAGNOSIS — F411 Generalized anxiety disorder: Secondary | ICD-10-CM

## 2022-03-07 MED ORDER — DIAZEPAM 10 MG PO TABS: 10.0000 mg | ORAL_TABLET | Freq: Two times a day (BID) | ORAL | 1 refills | Status: DC | PRN

## 2022-03-07 NOTE — Telephone Encounter (Signed)
Patient would like a refill on her diazepam.  Requesting: diazepam Contract: 07/16/21 UDS: 02/04/22 Last Visit: 02/04/22 Next Visit: none Last Refill: 02/04/22  Please Advise

## 2022-03-07 NOTE — Telephone Encounter (Signed)
Approved from 03/06/22-03/07/23.

## 2022-03-13 ENCOUNTER — Other Ambulatory Visit: Payer: Self-pay | Admitting: Family Medicine

## 2022-03-13 DIAGNOSIS — F909 Attention-deficit hyperactivity disorder, unspecified type: Secondary | ICD-10-CM

## 2022-03-13 NOTE — Telephone Encounter (Signed)
Medication: methylphenidate (RITALIN) 20 MG tablet   Has the patient contacted their pharmacy? No.  Preferred Pharmacy:  [facility], [place] - [address]   [place], [place] [address]  Phone:  [phone]  Fax:  [phone]

## 2022-03-14 MED ORDER — METHYLPHENIDATE HCL 20 MG PO TABS: 20.0000 mg | ORAL_TABLET | Freq: Three times a day (TID) | ORAL | 0 refills | Status: DC

## 2022-03-14 NOTE — Telephone Encounter (Signed)
Requesting: Ritalin '20mg'$   Contract: 07/16/21 UDS: 02/04/22 Last Visit:02/04/22 Next Visit: not scheduled Last Refill: 02/13/22  Please Advise

## 2022-03-19 ENCOUNTER — Telehealth (INDEPENDENT_AMBULATORY_CARE_PROVIDER_SITE_OTHER): Payer: PRIVATE HEALTH INSURANCE | Admitting: Family Medicine

## 2022-03-19 ENCOUNTER — Encounter: Payer: Self-pay | Admitting: Family Medicine

## 2022-03-19 DIAGNOSIS — Z30011 Encounter for initial prescription of contraceptive pills: Secondary | ICD-10-CM

## 2022-03-19 MED ORDER — NORGESTIMATE-ETH ESTRADIOL 0.25-35 MG-MCG PO TABS: 1.0000 | ORAL_TABLET | Freq: Every day | ORAL | 3 refills | Status: DC

## 2022-03-19 NOTE — Progress Notes (Signed)
Virtual Video Visit via MyChart Note  I connected with  [name] on 03/19/22 at  8:40 AM EDT by the video enabled telemedicine application for MyChart, and verified that I am speaking with the correct person using two identifiers.   I introduced myself as a Designer, jewellery with the practice. We discussed the limitations of evaluation and management by telemedicine and the availability of in person appointments. The patient expressed understanding and agreed to proceed.  Participating parties in this visit include: The patient and the nurse practitioner listed.  The patient is: At home I am: In the office - [facility] [place]:    CC: contraception counseling   HPI: Melissa [name] is a 31 y.o. year old female presenting today via [place] today for birth control.    CONTRACEPTION EVALUATION Current contraception: no Previous contraception: [place] 2013 (reports she did well on ortho-cyclen as a teenager, early-20's) Sexual activity: not regularly, married  Gravida/Para: G0P0 Menarche at age: 1 Average interval between menses: 4 weeks Length of menses: 7 days Flow: moderate Dysmenorrhea: no History of STI: no Any specific concerns or avoidances requested: something to help with acne No migraines, HTN, smoking, VTE LMP: 03/14/22   Past medical history, Surgical history, Family history not pertinant except as noted below, Social history, Allergies, and medications have been entered into the medical record, reviewed, and corrections made.   Review of Systems:  All review of systems negative except what is listed in the HPI   Objective:    General:  Speaking clearly in complete sentences. Absent shortness of breath noted.   Alert and oriented x3.   Normal judgment.  Absent acute distress.   Impression and Recommendations:    1. Encounter for initial prescription of contraceptive pills Discussed options with patient. States she  has done well on ortho-cyclen in the past. Discussed risks/benefits, side effects, etc - information added to AVS with medication education and information on starting birth control and what to do if pills are missed. Patient voiced understanding. No concerns.   - norgestimate-ethinyl estradiol (ORTHO-CYCLEN) 0.25-35 MG-MCG tablet; Take 1 tablet by mouth daily.  Dispense: 84 tablet; Refill: 3   Follow-up if symptoms worsen or fail to improve.    I discussed the assessment and treatment plan with the patient. The patient was provided an opportunity to ask questions and all were answered. The patient agreed with the plan and demonstrated an understanding of the instructions.   The patient was advised to call back or seek an in-person evaluation if the symptoms worsen or if the condition fails to improve as anticipated.   Terrilyn Saver, NP

## 2022-03-19 NOTE — Progress Notes (Signed)
Wants birth control pills that will help with acne and pcos

## 2022-03-19 NOTE — Patient Instructions (Signed)
Starting Your Oral Contraception:  1) First Day Start - Take your first pill during the first 24 hours of your menstrual cycle. No back-up contraceptivemethod is needed when the pill is started the first day of your menses.  2) Sunday Start - Wait until the first Sunday after your menstrual cycle begins to take your first pill. With this optionuse another method of birth control for the first 7 days of the first cycle only.  3) "Quick Start" - Start the pill today. If you have had unprotected sexual intercourse since your last period, perform a pregnancy test prior to starting the pill. If it is negative, start the pill today. Use another method of birth control such as condoms or spermicide the first seven days of the first cycle of use.  Oral Contraception Answers to Common Questions.   1)  Take your birth control pill at the same time every day. This ensures that a constant hormone level is maintained at all times.  2) Should you experience nausea or vomiting, try taking your pill after a meal or at bedtime.  3)  Irregular vaginal bleeding or spotting may occur while you are taking the pills, especially during the first few months of oral contraceptive use. If you experience spotting or break-through bleeding, (bleeding that occurs outside of the placebo week) that occurs after the first 3 cycles, or lasts for more than a few days, see your health care provider.  4) Birth control pills do not protect you from sexually transmitted infections.  INSTRUCTIONS FOR MISSED PILLS: 1 active pill < 24 hours late in any week: Take 1 active pill ASAP* and continue pack as usual.  Missed 1 or more active pills (i.e., >24 hours late): If during week 1: Take 1 active pill ASAP* and continue pack as usual. Back-up contraception for 7 days. Consider Emergency Contraception (EC) if unprotected intercourse occurred within the  5 days prior to missing pill.  If during week 2 or 3 and missed < 3  pills: Take 1 active pill ASAP* and continue active pills as usual, but discard placebo pills and start a new pack  If during week 2 or 3 and ? 3 pills missed: Take 1 active pill ASAP* and continue active pills as usual, but discard placebo pills and start a new pack Back-up contraception for 7 days. Consider EC if repeated or prolonged omission, or if unprotected intercourse occurred during the time the pills were missed and up until seven active pills have been taken Instructions for missed extended or continuous hormonal contraceptives:  Missed pill after 21 consecutive days of extended or continuous use, up to 7 days can be missed.  If > 7 days missed, instructions would be the same as for cyclic users who have missed/delayed combined hormonal contraceptive in the first week of use.  When the extended/continuous regimen is resumed, recommendations for cyclic users for missed/delayed combined hormonal contraceptive during the first 21 consecutive days of use should be followed.  **If you still are not sure what to do about the pills you have missed, use a back-up method anytime you have sex. Keep taking one pill each day until you can reach your doctor or clinic.  MEDICATIONS AND BIRTH CONTROL PILLS: The following medications and supplements may interfere with the effectiveness of CHC:  some antibiotics  anticonvulsants  St. John's Wort  Provigil  It is recommend that if you take the above medications and supplements and are sexually active with a female partner,   you use a back-up method of birth control while using the medication and for 7 consecutive days once the medication is completed.  IF YOU EXPERIENCE ANY OF THE FOLLOWING, PLEASE CALL IMMEDIATELY OR GO TO THE EMERGENCY ROOM:  severe abdominal pain or tenderness in the lower abdomen chest pain, sharp, sudden shortness of breath or coughing up blood headache, severe and sudden, or vomiting, dizziness or faintness eyesight  problems, such as sudden blurred or doubled vision or flashes of light severe pain or swelling in calf or groin  

## 2022-04-08 ENCOUNTER — Other Ambulatory Visit: Payer: Self-pay | Admitting: Family Medicine

## 2022-04-08 DIAGNOSIS — F909 Attention-deficit hyperactivity disorder, unspecified type: Secondary | ICD-10-CM

## 2022-04-09 ENCOUNTER — Encounter: Payer: Self-pay | Admitting: Physician Assistant

## 2022-04-09 MED ORDER — METHYLPHENIDATE HCL 20 MG PO TABS: 20.0000 mg | ORAL_TABLET | Freq: Three times a day (TID) | ORAL | 0 refills | Status: DC

## 2022-04-09 NOTE — Telephone Encounter (Signed)
Requesting: Ritalin '20mg'$   Contract:06/17/21 UDS: 02/04/22 Last Visit: 03/19/22 w/ Lovena Le Next Visit: None Last Refill: 03/14/22 #90 and 0RF  Please Advise

## 2022-04-10 NOTE — Telephone Encounter (Signed)
Message sent to patient. Forwarded to PCP as it seems this message was meant for them but accidentally sent to our digital health team.

## 2022-04-11 NOTE — Telephone Encounter (Signed)
Called pt appointment was made for VV 10:20 and pt was advised if bleeding gets heavier suddenly need to seek  ER or Urgent care for evaluation.

## 2022-04-14 ENCOUNTER — Telehealth (INDEPENDENT_AMBULATORY_CARE_PROVIDER_SITE_OTHER): Payer: PRIVATE HEALTH INSURANCE | Admitting: Family Medicine

## 2022-04-14 VITALS — BP 116/98 | Resp 16 | Ht 62.0 in | Wt 212.0 lb

## 2022-04-14 DIAGNOSIS — E282 Polycystic ovarian syndrome: Secondary | ICD-10-CM

## 2022-04-14 DIAGNOSIS — E785 Hyperlipidemia, unspecified: Secondary | ICD-10-CM

## 2022-04-14 DIAGNOSIS — E559 Vitamin D deficiency, unspecified: Secondary | ICD-10-CM | POA: Diagnosis not present

## 2022-04-14 DIAGNOSIS — R739 Hyperglycemia, unspecified: Secondary | ICD-10-CM | POA: Insufficient documentation

## 2022-04-14 DIAGNOSIS — R41840 Attention and concentration deficit: Secondary | ICD-10-CM

## 2022-04-14 DIAGNOSIS — N921 Excessive and frequent menstruation with irregular cycle: Secondary | ICD-10-CM | POA: Diagnosis not present

## 2022-04-14 DIAGNOSIS — N920 Excessive and frequent menstruation with regular cycle: Secondary | ICD-10-CM

## 2022-04-14 MED ORDER — VENLAFAXINE HCL ER 150 MG PO CP24: 150.0000 mg | ORAL_CAPSULE | Freq: Every day | ORAL | 2 refills | Status: DC

## 2022-04-14 MED ORDER — ONDANSETRON 4 MG PO TBDP: 4.0000 mg | ORAL_TABLET | Freq: Three times a day (TID) | ORAL | 1 refills | Status: DC | PRN

## 2022-04-14 NOTE — Assessment & Plan Note (Signed)
hgba1c acceptable, minimize simple carbs. Increase exercise as tolerated.  

## 2022-04-14 NOTE — Assessment & Plan Note (Signed)
maintain heart healthy diet such as MIND or DASH diet, increase exercise, avoid trans fats, simple carbohydrates and processed foods, consider a krill or fish or flaxseed oil cap daily.

## 2022-04-14 NOTE — Telephone Encounter (Signed)
Called pt was advised of needing lab appointment and Follow up appt.  FU was made 05/15/22 and Pt stated she will call back to make lab appt for this week

## 2022-04-14 NOTE — Assessment & Plan Note (Signed)
She is requesting a switch back from Ritalin to Concerta. She is advised to check the pharmacy to see if they have it back in stock and she can switch next month. Will consider either 36 or 54 mg doses

## 2022-04-14 NOTE — Assessment & Plan Note (Signed)
She had heavy bleeding all last week but it has stopped now. She has referred back to OB/GYN for further work up and management. Will run labs this week to evaluate for anemia. She has been referred previously but never went. She agrees to go this time.

## 2022-04-14 NOTE — Progress Notes (Signed)
MyChart Video Visit    Virtual Visit via Video Note   This visit type was conducted due to national recommendations for restrictions regarding the COVID-19 Pandemic (e.g. social distancing) in an effort to limit this patient's exposure and mitigate transmission in our community. This patient is at least at moderate risk for complications without adequate follow up. This format is felt to be most appropriate for this patient at this time. Physical exam was limited by quality of the video and audio technology used for the visit. [name], CMA was able to get the patient set up on a video visit.  Patient location: home Patient and provider in visit Provider location: Office  I discussed the limitations of evaluation and management by telemedicine and the availability of in person appointments. The patient expressed understanding and agreed to proceed.  Visit Date: 04/14/2022  Today's healthcare provider: Penni Homans, MD     Subjective:    Patient ID: Melissa [name], female    DOB: [birth year], 31 y.o.   MRN: [id number]  Chief Complaint  Patient presents with   Vaginal Bleeding    Heavy vag bleeding    HPI Patient is in today for follow up on chronic medical concerns. No recent febrile illness or acute hospitalizations. She had very heavy bleeding for about 7 days last week. The bleeding has subsided but she has struggled with persistent nausea and fatigue. No fevers or chills. No new partners or sexual activity. She is also hoping to switch back to concerta from Ritalin. Denies CP/palp/SOB/HA/congestion/fevers/GI or GU c/o. Taking meds as prescribed  Past Medical History:  Diagnosis Date   Abnormal Pap smear of cervix 07/11/2011   Acid reflux    Anxiety    Asthma    activity induced   Irregular menses    Polycystic ovarian syndrome     Past Surgical History:  Procedure Laterality Date   NO PAST SURGERIES      Family History  Problem Relation Age of Onset   Gallbladder  disease Mother    Colon polyps Mother    Hypertension Father    Other Father        Hepatitis A   Diabetes Father    Drug abuse Father    Post-traumatic stress disorder Father    Stomach cancer Maternal Grandmother    Diabetes Maternal Grandmother    Colon polyps Maternal Grandmother    Colon cancer Maternal Grandmother    Cancer Maternal Grandmother        stomach cancer   Alcohol abuse Maternal Grandmother    Emphysema Maternal Grandfather    Cancer Maternal Grandfather        smoke, lung cancer   Alcohol abuse Maternal Grandfather    Heart disease Paternal Grandfather    Alcohol abuse Paternal Grandfather    Breast cancer Other        mat great aunts x 2   Colon cancer Other        mat great uncles x 2   Irritable bowel syndrome Sister    Multiple sclerosis Sister    Bipolar disorder Sister    Colon polyps Paternal Uncle    Alcohol abuse Paternal Grandmother    Pancreatic cancer Neg Hx    Esophageal cancer Neg Hx     Social History   Socioeconomic History   Marital status: Married    Spouse name: Not on file   Number of children: 0   Years of education: Not on file  Highest education level: Not on file  Occupational History   Occupation: student    Employer: [employer]   Tobacco Use   Smoking status: Never   Smokeless tobacco: Never  Vaping Use   Vaping Use: Never used  Substance and Sexual Activity   Alcohol use: Not Currently    Alcohol/week: 4.0 - 7.0 standard drinks of alcohol    Types: 4 - 7 Cans of beer per week    Comment: 1 beer every other ay   Drug use: Not Currently    Types: Marijuana   Sexual activity: Yes    Partners: Male    Birth control/protection: None  Other Topics Concern   Not on file  Social History Narrative   Caffeine use:  1 can soda daily   Regular exercise:  Just started   2 sisters.      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    Social Determinants of Health   Financial Resource Strain: Not on file  Food Insecurity: Not on file  Transportation Needs: Not on file  Physical Activity: Not on file  Stress: Not on file  Social Connections: Not on file  Intimate Partner Violence: Not on file    Outpatient Medications Prior to Visit  Medication Sig Dispense Refill   albuterol (VENTOLIN HFA) 108 (90 Base) MCG/ACT inhaler Inhale 1-2 puffs into the lungs every 6 (six) hours as needed for wheezing or shortness of breath. 18 g 2   busPIRone (BUSPAR) 15 MG tablet Take 1 tablet (15 mg total) by mouth 3 (three) times daily. 90 tablet 3   diazepam (VALIUM) 10 MG tablet Take 1 tablet (10 mg total) by mouth every 12 (twelve) hours as needed. for anxiety 60 tablet 1   methylphenidate (RITALIN) 20 MG tablet Take 1 tablet (20 mg total) by mouth 3 (three) times daily with meals. 90 tablet 0   norgestimate-ethinyl estradiol (ORTHO-CYCLEN) 0.25-35 MG-MCG tablet Take 1 tablet by mouth daily. 84 tablet 3   venlafaxine XR (EFFEXOR XR) 75 MG 24 hr capsule Take 1 capsule (75 mg total) by mouth daily with breakfast. 90 capsule 1   No facility-administered medications prior to visit.    No Known Allergies  Review of Systems  Constitutional:  Positive for malaise/fatigue. Negative for fever.  HENT:  Negative for congestion.   Eyes:  Negative for blurred vision.  Respiratory:  Negative for shortness of breath.   Cardiovascular:  Negative for chest pain, palpitations and leg swelling.  Gastrointestinal:  Positive for abdominal pain and nausea. Negative for blood in stool.  Genitourinary:  Negative for dysuria and frequency.  Musculoskeletal:  Negative for falls.  Skin:  Negative for rash.  Neurological:  Negative for dizziness, loss of consciousness and headaches.  Endo/Heme/Allergies:  Negative for environmental allergies.  Psychiatric/Behavioral:  Positive for  depression. The patient is nervous/anxious.        Objective:    Physical Exam Constitutional:      General: She is not in acute distress.    Appearance: Normal appearance. She is not ill-appearing or toxic-appearing.  HENT:     Head: Normocephalic and atraumatic.     Right Ear: External ear normal.     Left Ear: External ear normal.     Nose: Nose normal.  Eyes:  General:        Right eye: No discharge.        Left eye: No discharge.  Pulmonary:     Effort: Pulmonary effort is normal.  Skin:    Findings: No rash.  Neurological:     Mental Status: She is alert and oriented to person, place, and time.  Psychiatric:        Behavior: Behavior normal.     BP (!) 116/98 (BP Location: Right Arm, Patient Position: Sitting, Cuff Size: Normal)   Resp 16   Ht '5\' 2"'$  (1.575 m)   Wt 212 lb (96.2 kg)   BMI 38.78 kg/m  Wt Readings from Last 3 Encounters:  04/14/22 212 lb (96.2 kg)  10/24/21 227 lb (103 kg)  10/16/21 227 lb (103 kg)    Diabetic Foot Exam - Simple   No data filed    Lab Results  Component Value Date   WBC 8.7 10/16/2021   HGB 13.9 10/16/2021   HCT 41.0 10/16/2021   PLT 376 10/16/2021   GLUCOSE 114 (H) 10/16/2021   CHOL 220 (H) 06/17/2021   TRIG 127.0 06/17/2021   HDL 46.10 06/17/2021   LDLCALC 149 (H) 06/17/2021   ALT 25 10/16/2021   AST 28 10/16/2021   NA 141 10/16/2021   K 4.1 10/16/2021   CL 104 10/16/2021   CREATININE 0.70 10/16/2021   BUN 8 10/16/2021   CO2 24 10/16/2021   TSH 2.32 06/17/2021   INR 0.9 10/16/2021   HGBA1C 6.4 06/17/2021    Lab Results  Component Value Date   TSH 2.32 06/17/2021   Lab Results  Component Value Date   WBC 8.7 10/16/2021   HGB 13.9 10/16/2021   HCT 41.0 10/16/2021   MCV 80.7 10/16/2021   PLT 376 10/16/2021   Lab Results  Component Value Date   NA 141 10/16/2021   K 4.1 10/16/2021   CO2 24 10/16/2021   GLUCOSE 114 (H) 10/16/2021   BUN 8 10/16/2021   CREATININE 0.70 10/16/2021   BILITOT 0.2  (L) 10/16/2021   ALKPHOS 61 10/16/2021   AST 28 10/16/2021   ALT 25 10/16/2021   PROT 7.2 10/16/2021   ALBUMIN 3.6 10/16/2021   CALCIUM 8.8 (L) 10/16/2021   ANIONGAP 11 10/16/2021   GFR 106.85 06/17/2021   Lab Results  Component Value Date   CHOL 220 (H) 06/17/2021   Lab Results  Component Value Date   HDL 46.10 06/17/2021   Lab Results  Component Value Date   LDLCALC 149 (H) 06/17/2021   Lab Results  Component Value Date   TRIG 127.0 06/17/2021   Lab Results  Component Value Date   CHOLHDL 5 06/17/2021   Lab Results  Component Value Date   HGBA1C 6.4 06/17/2021       Assessment & Plan:   Problem List Items Addressed This Visit     PCOS (polycystic ovarian syndrome)    She had heavy bleeding all last week but it has stopped now. She has referred back to OB/GYN for further work up and management. Will run labs this week to evaluate for anemia. She has been referred previously but never went. She agrees to go this time.      Morbid obesity ([place])   Poor concentration    She is requesting a switch back from Ritalin to Concerta. She is advised to check the pharmacy to see if they have it back in stock and she can switch next month. Will consider  either 36 or 54 mg doses      Menometrorrhagia   Relevant Orders   CBC w/Diff   Iron, TIBC and Ferritin Panel   TSH   Vitamin D deficiency    Supplement and monitor      Relevant Orders   Comprehensive metabolic panel   VITAMIN D 25 Hydroxy (Vit-D Deficiency, Fractures)   Hyperglycemia    hgba1c acceptable, minimize simple carbs. Increase exercise as tolerated.        Relevant Orders   Hemoglobin A1c   TSH   Hyperlipidemia    maintain heart healthy diet such as MIND or DASH diet, increase exercise, avoid trans fats, simple carbohydrates and processed foods, consider a krill or fish or flaxseed oil cap daily.       Relevant Orders   Lipid panel   TSH   Other Visit Diagnoses     Menorrhagia with regular  cycle    -  Primary   Relevant Orders   Ambulatory referral to Obstetrics / Gynecology       I have discontinued Renatha Kapuscinski's venlafaxine XR. I am also having her start on venlafaxine XR. Additionally, I am having her maintain her albuterol, busPIRone, diazepam, norgestimate-ethinyl estradiol, methylphenidate, and ondansetron.  Meds ordered this encounter  Medications   ondansetron (ZOFRAN ODT) 4 MG disintegrating tablet    Sig: Take 1 tablet (4 mg total) by mouth every 8 (eight) hours as needed for nausea or vomiting.    Dispense:  30 tablet    Refill:  1   venlafaxine XR (EFFEXOR-XR) 150 MG 24 hr capsule    Sig: Take 1 capsule (150 mg total) by mouth daily with breakfast.    Dispense:  30 capsule    Refill:  2    I discussed the assessment and treatment plan with the patient. The patient was provided an opportunity to ask questions and all were answered. The patient agreed with the plan and demonstrated an understanding of the instructions.   The patient was advised to call back or seek an in-person evaluation if the symptoms worsen or if the condition fails to improve as anticipated.     Penni Homans, MD [facility] [phone] (phone) [phone] (fax)  [place]

## 2022-04-14 NOTE — Assessment & Plan Note (Signed)
Supplement and monitor 

## 2022-04-30 ENCOUNTER — Other Ambulatory Visit: Payer: Self-pay

## 2022-04-30 ENCOUNTER — Encounter: Payer: Self-pay | Admitting: Family Medicine

## 2022-04-30 ENCOUNTER — Other Ambulatory Visit: Payer: Self-pay | Admitting: Family Medicine

## 2022-04-30 DIAGNOSIS — F411 Generalized anxiety disorder: Secondary | ICD-10-CM

## 2022-04-30 MED ORDER — BUSPIRONE HCL 15 MG PO TABS: 15.0000 mg | ORAL_TABLET | Freq: Three times a day (TID) | ORAL | 3 refills | Status: DC

## 2022-04-30 MED ORDER — DIAZEPAM 10 MG PO TABS: 10.0000 mg | ORAL_TABLET | Freq: Two times a day (BID) | ORAL | 1 refills | Status: DC | PRN

## 2022-04-30 NOTE — Telephone Encounter (Signed)
Rx was sent in. 

## 2022-05-15 ENCOUNTER — Telehealth: Payer: PRIVATE HEALTH INSURANCE | Admitting: Family Medicine

## 2022-05-16 ENCOUNTER — Telehealth: Payer: Self-pay | Admitting: Family Medicine

## 2022-05-16 ENCOUNTER — Other Ambulatory Visit: Payer: Self-pay | Admitting: Family Medicine

## 2022-05-16 DIAGNOSIS — F909 Attention-deficit hyperactivity disorder, unspecified type: Secondary | ICD-10-CM

## 2022-05-16 MED ORDER — METHYLPHENIDATE HCL 20 MG PO TABS: 20.0000 mg | ORAL_TABLET | Freq: Three times a day (TID) | ORAL | 0 refills | Status: DC

## 2022-05-16 NOTE — Telephone Encounter (Signed)
Pt called stating that the ritalin had been sent to the wrong pharmacy and needs to be rerouted to the following:  [place] ([place]), [place] - [address]  [address] [facility] [place] [address]  Phone:  [phone]  Fax:  [phone]

## 2022-05-16 NOTE — Telephone Encounter (Signed)
Medication: methylphenidate (RITALIN) 20 MG tablet  Has the patient contacted their pharmacy? No.   Preferred Pharmacy (with phone number or street name):   [place] ([place]), [place] - [address]  [address] [facility] [place] [address]  Phone:  [phone]  Fax:  [phone]

## 2022-05-19 NOTE — Assessment & Plan Note (Signed)
hgba1c acceptable, minimize simple carbs. Increase exercise as tolerated.  

## 2022-05-19 NOTE — Assessment & Plan Note (Signed)
Has struggled to find a medication that is helpful that she can find in stock. Ritalin keeps her unsettled. Will try switching back to Concerta 36 mg in am with just 10 mg of Ritalin prn in the afternoons.

## 2022-05-19 NOTE — Assessment & Plan Note (Signed)
Supplement and monitor 

## 2022-05-19 NOTE — Assessment & Plan Note (Signed)
Encourage heart healthy diet such as MIND or DASH diet, increase exercise, avoid trans fats, simple carbohydrates and processed foods, consider a krill or fish or flaxseed oil cap daily.  °

## 2022-05-20 ENCOUNTER — Telehealth (INDEPENDENT_AMBULATORY_CARE_PROVIDER_SITE_OTHER): Payer: PRIVATE HEALTH INSURANCE | Admitting: Family Medicine

## 2022-05-20 VITALS — BP 120/65

## 2022-05-20 DIAGNOSIS — R739 Hyperglycemia, unspecified: Secondary | ICD-10-CM | POA: Diagnosis not present

## 2022-05-20 DIAGNOSIS — E559 Vitamin D deficiency, unspecified: Secondary | ICD-10-CM

## 2022-05-20 DIAGNOSIS — F909 Attention-deficit hyperactivity disorder, unspecified type: Secondary | ICD-10-CM

## 2022-05-20 DIAGNOSIS — D619 Aplastic anemia, unspecified: Secondary | ICD-10-CM

## 2022-05-20 DIAGNOSIS — E785 Hyperlipidemia, unspecified: Secondary | ICD-10-CM | POA: Diagnosis not present

## 2022-05-20 DIAGNOSIS — F320 Major depressive disorder, single episode, mild: Secondary | ICD-10-CM

## 2022-05-20 MED ORDER — METHYLPHENIDATE HCL 10 MG PO TABS: 10.0000 mg | ORAL_TABLET | Freq: Every day | ORAL | 0 refills | Status: DC | PRN

## 2022-05-20 MED ORDER — METHYLPHENIDATE HCL ER (OSM) 36 MG PO TBCR: 36.0000 mg | EXTENDED_RELEASE_TABLET | Freq: Every day | ORAL | 0 refills | Status: DC

## 2022-05-20 MED ORDER — VENLAFAXINE HCL ER 37.5 MG PO CP24: ORAL_CAPSULE | ORAL | 0 refills | Status: DC

## 2022-05-20 NOTE — Progress Notes (Signed)
MyChart Video Visit    Virtual Visit via Video Note   This visit type was conducted due to national recommendations for restrictions regarding the COVID-19 Pandemic (e.g. social distancing) in an effort to limit this patient's exposure and mitigate transmission in our community. This patient is at least at moderate risk for complications without adequate follow up. This format is felt to be most appropriate for this patient at this time. Physical exam was limited by quality of the video and audio technology used for the visit. [name], CMA was able to get the patient set up on a video visit.  Patient location: Home patient and provider in visit Provider location: Office  I discussed the limitations of evaluation and management by telemedicine and the availability of in person appointments. The patient expressed understanding and agreed to proceed.  Visit Date: 05/20/2022  Today's healthcare provider: Penni Homans, MD     Subjective:    Patient ID: Melissa [name], female    DOB: 17-Dec-1990, 31 y.o.   MRN: [id number]  Chief Complaint  Patient presents with   Follow-up    Here for follow up   HPI Patient is in today for a virtual visit.   Psychiatry: She expresses that she is severely depressed and does not like taking multiple medications to manage this. However, she is open to taking Concerta and Ritalin. She reports that she is currently taking Buspirone 15 mg 3 times daily and only takes Diazepam 10 mg during emergencies. She further reports that she is not currently taking Venlafaxine, and it has been 2 weeks since taking it. She states that she has taken ADDERALL in the past, but did not find it helpful. She is currently seeing a psychologist at [facility] and has a psychiatrist appointment scheduled for the future. On 05/19/2022, she left her husband, which worsened her depression.  Past Medical History:  Diagnosis Date   Abnormal Pap smear of cervix 07/11/2011   Acid  reflux    Anxiety    Asthma    activity induced   Irregular menses    Polycystic ovarian syndrome    Past Surgical History:  Procedure Laterality Date   NO PAST SURGERIES     Family History  Problem Relation Age of Onset   Gallbladder disease Mother    Colon polyps Mother    Hypertension Father    Other Father        Hepatitis A   Diabetes Father    Drug abuse Father    Post-traumatic stress disorder Father    Stomach cancer Maternal Grandmother    Diabetes Maternal Grandmother    Colon polyps Maternal Grandmother    Colon cancer Maternal Grandmother    Cancer Maternal Grandmother        stomach cancer   Alcohol abuse Maternal Grandmother    Emphysema Maternal Grandfather    Cancer Maternal Grandfather        smoke, lung cancer   Alcohol abuse Maternal Grandfather    Heart disease Paternal Grandfather    Alcohol abuse Paternal Grandfather    Breast cancer Other        mat great aunts x 2   Colon cancer Other        mat great uncles x 2   Irritable bowel syndrome Sister    Multiple sclerosis Sister    Bipolar disorder Sister    Colon polyps Paternal Uncle    Alcohol abuse Paternal Grandmother    Pancreatic cancer Neg Hx  Esophageal cancer Neg Hx    Social History   Socioeconomic History   Marital status: Married    Spouse name: Not on file   Number of children: 0   Years of education: Not on file   Highest education level: Not on file  Occupational History   Occupation: student    Employer: [employer]   Tobacco Use   Smoking status: Never   Smokeless tobacco: Never  Vaping Use   Vaping Use: Never used  Substance and Sexual Activity   Alcohol use: Not Currently    Alcohol/week: 4.0 - 7.0 standard drinks of alcohol    Types: 4 - 7 Cans of beer per week    Comment: 1 beer every other ay   Drug use: Not Currently    Types: Marijuana   Sexual activity: Yes    Partners: Male    Birth control/protection: None  Other Topics Concern   Not on file   Social History Narrative   Caffeine use:  1 can soda daily   Regular exercise:  Just started   2 sisters.      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    Social Determinants of Health   Financial Resource Strain: Not on file  Food Insecurity: Not on file  Transportation Needs: Not on file  Physical Activity: Not on file  Stress: Not on file  Social Connections: Not on file  Intimate Partner Violence: Not on file   Outpatient Medications Prior to Visit  Medication Sig Dispense Refill   albuterol (VENTOLIN HFA) 108 (90 Base) MCG/ACT inhaler Inhale 1-2 puffs into the lungs every 6 (six) hours as needed for wheezing or shortness of breath. 18 g 2   busPIRone (BUSPAR) 15 MG tablet Take 1 tablet (15 mg total) by mouth 3 (three) times daily. 90 tablet 3   diazepam (VALIUM) 10 MG tablet Take 1 tablet (10 mg total) by mouth every 12 (twelve) hours as needed. for anxiety 60 tablet 1   norgestimate-ethinyl estradiol (ORTHO-CYCLEN) 0.25-35 MG-MCG tablet Take 1 tablet by mouth daily. 84 tablet 3   ondansetron (ZOFRAN ODT) 4 MG disintegrating tablet Take 1 tablet (4 mg total) by mouth every 8 (eight) hours as needed for nausea or vomiting. 30 tablet 1   venlafaxine XR (EFFEXOR-XR) 150 MG 24 hr capsule Take 1 capsule (150 mg total) by mouth daily with breakfast. 30 capsule 2   methylphenidate (RITALIN) 20 MG tablet Take 1 tablet (20 mg total) by mouth 3 (three) times daily with meals. 90 tablet 0   No facility-administered medications prior to visit.   No Known Allergies  Review of Systems  Psychiatric/Behavioral:  Positive for depression.       Objective:    Physical Exam  BP 120/65 (BP Location: Right Arm, Patient Position: Sitting, Cuff Size: Normal)   SpO2 99%  Wt Readings from Last 3 Encounters:  04/14/22 212 lb (96.2 kg)  10/24/21  227 lb (103 kg)  10/16/21 227 lb (103 kg)   Diabetic Foot Exam - Simple   No data filed    Lab Results  Component Value Date   WBC 8.7 10/16/2021   HGB 13.9 10/16/2021   HCT 41.0 10/16/2021   PLT 376 10/16/2021   GLUCOSE 114 (H) 10/16/2021   CHOL 220 (H) 06/17/2021  TRIG 127.0 06/17/2021   HDL 46.10 06/17/2021   LDLCALC 149 (H) 06/17/2021   ALT 25 10/16/2021   AST 28 10/16/2021   NA 141 10/16/2021   K 4.1 10/16/2021   CL 104 10/16/2021   CREATININE 0.70 10/16/2021   BUN 8 10/16/2021   CO2 24 10/16/2021   TSH 2.32 06/17/2021   INR 0.9 10/16/2021   HGBA1C 6.4 06/17/2021   Lab Results  Component Value Date   TSH 2.32 06/17/2021   Lab Results  Component Value Date   WBC 8.7 10/16/2021   HGB 13.9 10/16/2021   HCT 41.0 10/16/2021   MCV 80.7 10/16/2021   PLT 376 10/16/2021   Lab Results  Component Value Date   NA 141 10/16/2021   K 4.1 10/16/2021   CO2 24 10/16/2021   GLUCOSE 114 (H) 10/16/2021   BUN 8 10/16/2021   CREATININE 0.70 10/16/2021   BILITOT 0.2 (L) 10/16/2021   ALKPHOS 61 10/16/2021   AST 28 10/16/2021   ALT 25 10/16/2021   PROT 7.2 10/16/2021   ALBUMIN 3.6 10/16/2021   CALCIUM 8.8 (L) 10/16/2021   ANIONGAP 11 10/16/2021   GFR 106.85 06/17/2021   Lab Results  Component Value Date   CHOL 220 (H) 06/17/2021   Lab Results  Component Value Date   HDL 46.10 06/17/2021   Lab Results  Component Value Date   LDLCALC 149 (H) 06/17/2021   Lab Results  Component Value Date   TRIG 127.0 06/17/2021   Lab Results  Component Value Date   CHOLHDL 5 06/17/2021   Lab Results  Component Value Date   HGBA1C 6.4 06/17/2021      Assessment & Plan:   Problem List Items Addressed This Visit     Depression    Her husband was drunk and ran into an [facility] store yesterday per patient so she has moved out and left him. That has worsened her already present depression. She endorses anhedonia but not suicidal ideation. She is set up with a psychologist and  has a future appt with psychiatry. No changes to those meds for now      Relevant Medications   venlafaxine XR (EFFEXOR XR) 37.5 MG 24 hr capsule   Adult ADHD    Has struggled to find a medication that is helpful that she can find in stock. Ritalin keeps her unsettled. Will try switching back to Concerta 36 mg in am with just 10 mg of Ritalin prn in the afternoons.      Vitamin D deficiency    Supplement and monitor      Hyperglycemia    hgba1c acceptable, minimize simple carbs. Increase exercise as tolerated.       Hyperlipidemia    Encourage heart healthy diet such as MIND or DASH diet, increase exercise, avoid trans fats, simple carbohydrates and processed foods, consider a krill or fish or flaxseed oil cap daily.       Relevant Orders   Comprehensive metabolic panel   Hemoglobin A1c   Lipid panel   TSH   Other Visit Diagnoses     Aplastic anemia ([place])    -  Primary   Relevant Orders   CBC with Differential/Platelet      Meds ordered this encounter  Medications   methylphenidate (CONCERTA) 36 MG PO CR tablet    Sig: Take 1 tablet (36 mg total) by mouth daily.    Dispense:  30 tablet    Refill:  0    October 2023  methylphenidate (RITALIN) 10 MG tablet    Sig: Take 1 tablet (10 mg total) by mouth daily as needed.    Dispense:  30 tablet    Refill:  0    October 2023   venlafaxine XR (EFFEXOR XR) 37.5 MG 24 hr capsule    Sig: 1 cap po daily x 7 days then increase to 2 caps po daily x 7 days    Dispense:  30 capsule    Refill:  0   I discussed the assessment and treatment plan with the patient. The patient was provided an opportunity to ask questions and all were answered. The patient agreed with the plan and demonstrated an understanding of the instructions.   The patient was advised to call back or seek an in-person evaluation if the symptoms worsen or if the condition fails to improve as anticipated.  I provided 30 minutes of face-to-face time during this  encounter.  I,Mohammed Iqbal,acting as a scribe for Penni Homans, MD.,have documented all relevant documentation on the behalf of Penni Homans, MD,as directed by  Penni Homans, MD while in the presence of Penni Homans, MD.  Penni Homans, MD [facility] [phone] (phone) [phone] (fax)  [place]

## 2022-05-21 NOTE — Assessment & Plan Note (Signed)
Her husband was drunk and ran into an [facility] store yesterday per patient so she has moved out and left him. That has worsened her already present depression. She endorses anhedonia but not suicidal ideation. She is set up with a psychologist and has a future appt with psychiatry. No changes to those meds for now

## 2022-06-07 ENCOUNTER — Telehealth: Payer: PRIVATE HEALTH INSURANCE | Admitting: Physician Assistant

## 2022-06-07 DIAGNOSIS — L089 Local infection of the skin and subcutaneous tissue, unspecified: Secondary | ICD-10-CM | POA: Diagnosis not present

## 2022-06-07 DIAGNOSIS — S0123XA Puncture wound without foreign body of nose, initial encounter: Secondary | ICD-10-CM | POA: Diagnosis not present

## 2022-06-07 MED ORDER — FLUCONAZOLE 150 MG PO TABS: ORAL_TABLET | ORAL | 0 refills | Status: DC

## 2022-06-07 MED ORDER — DOXYCYCLINE HYCLATE 100 MG PO TABS: 100.0000 mg | ORAL_TABLET | Freq: Two times a day (BID) | ORAL | 0 refills | Status: DC

## 2022-06-07 NOTE — Addendum Note (Signed)
Addended by: Brunetta Jeans on: 06/07/2022 09:03 AM   Modules accepted: Orders

## 2022-06-07 NOTE — Progress Notes (Signed)
I have spent 5 minutes in review of e-visit questionnaire, review and updating patient chart, medical decision making and response to patient.   Maliq Pilley Cody Vidal Lampkins, PA-C    

## 2022-06-07 NOTE — Progress Notes (Signed)
E Visit for Cellulitis  We are sorry that you are not feeling well. Here is how we plan to help!  Based on what you shared with me it looks like you have cellulitis/infected piercing.  Fromt he video attached it looks like you have removed piercing which is good. This needs to remain out until infection is fully healed. Keep area clean and dry. Avoid picking or messing with the area. If you have access to a clean syringe, you can flush with distilled water or even use a saline nasal rinse OTC to the area, patting completely dry after. I have prescribed Doxycycline to take as directed.  HOME CARE:  Take your medications as ordered and take all of them, even if the skin irritation appears to be healing.   GET HELP RIGHT AWAY IF:  Symptoms that don't begin to go away within 48 hours. Severe redness persists or worsens If the area turns color, spreads or swells. If it blisters and opens, develops yellow-brown crust or bleeds. You develop a fever or chills. If the pain increases or becomes unbearable.  Are unable to keep fluids and food down.  MAKE SURE YOU   Understand these instructions. Will watch your condition. Will get help right away if you are not doing well or get worse.  Thank you for choosing an e-visit.  Your e-visit answers were reviewed by a board certified advanced clinical practitioner to complete your personal care plan. Depending upon the condition, your plan could have included both over the counter or prescription medications.  Please review your pharmacy choice. Make sure the pharmacy is open so you can pick up prescription now. If there is a problem, you may contact your provider through [employer] and have the prescription routed to another pharmacy.  Your safety is important to [place]. If you have drug allergies check your prescription carefully.   For the next 24 hours you can use MyChart to ask questions about today's visit, request a non-urgent call back, or  ask for a work or school excuse. You will get an email in the next two days asking about your experience. I hope that your e-visit has been valuable and will speed your recovery.

## 2022-06-07 NOTE — Progress Notes (Signed)
Message sent to patient requesting further input regarding current symptoms. Awaiting patient response.  

## 2022-06-13 ENCOUNTER — Telehealth: Payer: Self-pay | Admitting: Family Medicine

## 2022-06-13 NOTE — Telephone Encounter (Signed)
Medication: methylphenidate (CONCERTA) 36 MG PO CR tablet   busPIRone (BUSPAR) 15 MG tablet   diazepam (VALIUM) 10 MG tablet   methylphenidate (RITALIN) 10 MG tablet    Preferred Pharmacy (with phone number or street name):  [facility] ([place]), [place] - [address] [place] [place] [address] Phone: [phone]  Fax: [phone]    Agent: Please be advised that RX refills may take up to 3 business days. We ask that you follow-up with your pharmacy.

## 2022-06-16 ENCOUNTER — Other Ambulatory Visit: Payer: Self-pay | Admitting: Family Medicine

## 2022-06-16 DIAGNOSIS — F411 Generalized anxiety disorder: Secondary | ICD-10-CM

## 2022-06-16 MED ORDER — METHYLPHENIDATE HCL ER (OSM) 36 MG PO TBCR: 36.0000 mg | EXTENDED_RELEASE_TABLET | Freq: Every day | ORAL | 0 refills | Status: DC

## 2022-06-16 MED ORDER — METHYLPHENIDATE HCL 10 MG PO TABS: 10.0000 mg | ORAL_TABLET | Freq: Every day | ORAL | 0 refills | Status: DC | PRN

## 2022-06-16 MED ORDER — DIAZEPAM 10 MG PO TABS: 10.0000 mg | ORAL_TABLET | Freq: Two times a day (BID) | ORAL | 1 refills | Status: DC | PRN

## 2022-06-16 MED ORDER — BUSPIRONE HCL 15 MG PO TABS: 15.0000 mg | ORAL_TABLET | Freq: Three times a day (TID) | ORAL | 3 refills | Status: DC

## 2022-06-17 ENCOUNTER — Ambulatory Visit (HOSPITAL_COMMUNITY): Admit: 2022-06-17 | Discharge status: Absent without leave | Payer: PRIVATE HEALTH INSURANCE

## 2022-06-17 ENCOUNTER — Other Ambulatory Visit: Payer: Self-pay

## 2022-06-17 ENCOUNTER — Telehealth: Payer: PRIVATE HEALTH INSURANCE | Admitting: Nurse Practitioner

## 2022-06-17 ENCOUNTER — Emergency Department (HOSPITAL_BASED_OUTPATIENT_CLINIC_OR_DEPARTMENT_OTHER)
Admission: EM | Admit: 2022-06-17 | Discharge: 2022-06-17 | Discharge status: Against Medical Advice | Payer: PRIVATE HEALTH INSURANCE

## 2022-06-17 DIAGNOSIS — J189 Pneumonia, unspecified organism: Secondary | ICD-10-CM

## 2022-06-17 MED ORDER — ALBUTEROL SULFATE (2.5 MG/3ML) 0.083% IN NEBU: 2.5000 mg | INHALATION_SOLUTION | Freq: Four times a day (QID) | RESPIRATORY_TRACT | 1 refills | Status: DC | PRN

## 2022-06-17 MED ORDER — ONDANSETRON HCL 4 MG PO TABS: 4.0000 mg | ORAL_TABLET | Freq: Three times a day (TID) | ORAL | 0 refills | Status: DC | PRN

## 2022-06-17 MED ORDER — AZITHROMYCIN 250 MG PO TABS: ORAL_TABLET | ORAL | 0 refills | Status: DC

## 2022-06-17 NOTE — ED Triage Notes (Signed)
BIB [facility] , pt from [facility] , dx pneumonia , need IV antibiotics .

## 2022-06-17 NOTE — Patient Instructions (Signed)

## 2022-06-17 NOTE — Progress Notes (Signed)
Virtual Visit Consent   Melissa [name], you are scheduled for a virtual visit with Melissa Hassell Done, [place], a [facility] provider, today.     Just as with appointments in the office, your consent must be obtained to participate.  Your consent will be active for this visit and any virtual visit you may have with one of our providers in the next 365 days.     If you have a MyChart account, a copy of this consent can be sent to you electronically.  All virtual visits are billed to your insurance company just like a traditional visit in the office.    As this is a virtual visit, video technology does not allow for your provider to perform a traditional examination.  This may limit your provider's ability to fully assess your condition.  If your provider identifies any concerns that need to be evaluated in person or the need to arrange testing (such as labs, EKG, etc.), we will make arrangements to do so.     Although advances in technology are sophisticated, we cannot ensure that it will always work on either your end or our end.  If the connection with a video visit is poor, the visit may have to be switched to a telephone visit.  With either a video or telephone visit, we are not always able to ensure that we have a secure connection.     I need to obtain your verbal consent now.   Are you willing to proceed with your visit today? YES   Melissa [name] has provided verbal consent on 06/17/2022 for a virtual visit (video or telephone).   Melissa Hassell Done, FNP   Date: 06/17/2022 6:55 PM   Virtual Visit via Video Note   I, Melissa [name], connected with Melissa [name] ([id number], [birth year]) on 06/17/22 at  7:00 PM EDT by a video-enabled telemedicine application and verified that I am speaking with the correct person using two identifiers.  Location: Patient: Virtual Visit Location Patient: Home Provider: Virtual Visit Location Provider: Mobile   I discussed the limitations of  evaluation and management by telemedicine and the availability of in person appointments. The patient expressed understanding and agreed to proceed.    History of Present Illness: Melissa [name] is a 31 y.o. who identifies as a female who was assigned female at birth, and is being seen today for pneumonia.  HPI: Patient just left the emergency room and was dx with oneumonia. She was not prescribed antibiotics. She was given a steroid injection and rocephin. She was told to go to the med center to get IV antibiotics. When she got to the med center it was so crowded that she did not want to wait. The triage nurse at Med center told her to do video visit to get oral antibiotics. She is also having some nausea from coughing so much.  * needs refill of albuterol for her nebulizer    Review of Systems  Constitutional:  Positive for chills and fever.  HENT:  Positive for congestion.   Respiratory:  Positive for cough and sputum production. Negative for shortness of breath.   Gastrointestinal:  Positive for nausea.  Neurological:  Negative for dizziness and headaches.    Problems:  Patient Active Problem List   Diagnosis Date Noted   Hyperglycemia 04/14/2022   Hyperlipidemia 04/14/2022   Menstrual bleeding problem 06/17/2021   Chronic vaginitis 06/17/2021   Menometrorrhagia 02/10/2021   Hypocalcemia 02/10/2021   Elevated CK 02/10/2021   Reflux gastritis  02/10/2021   Atypical chest pain 02/10/2021   Muscle spasm 02/10/2021   Vitamin D deficiency 02/10/2021   Musculoskeletal pain 01/11/2021   Female infertility, primary 05/22/2020   GAD (generalized anxiety disorder) 03/12/2020   Panic disorder 03/12/2020   Adult ADHD 03/12/2020   Tachycardia 12/16/2019   Cervical cancer screening 11/14/2019   Preventative health care 11/14/2019   Anxiety and depression 10/23/2019   History of COVID-19 09/25/2019   Morbid obesity ([place]) 01/31/2016   Hip abductor tendinitis 01/23/2015   Acanthosis  nigricans 09/12/2014   Snoring 09/12/2014   Oligo-ovulation 07/04/2014   Nausea with vomiting 11/21/2013   Sinusitis 11/21/2013   Nevus 02/11/2013   GERD (gastroesophageal reflux disease) 02/11/2013   Epigastric pain 07/11/2011   PCOS (polycystic ovarian syndrome) 07/11/2011   Abnormal Pap smear of cervix 07/11/2011   Depression 07/11/2011   Asthma 07/11/2011   Weight gain 07/11/2011    Allergies: No Known Allergies Medications:  Current Outpatient Medications:    albuterol (VENTOLIN HFA) 108 (90 Base) MCG/ACT inhaler, Inhale 1-2 puffs into the lungs every 6 (six) hours as needed for wheezing or shortness of breath., Disp: 18 g, Rfl: 2   busPIRone (BUSPAR) 15 MG tablet, Take 1 tablet (15 mg total) by mouth 3 (three) times daily., Disp: 90 tablet, Rfl: 3   diazepam (VALIUM) 10 MG tablet, Take 1 tablet (10 mg total) by mouth every 12 (twelve) hours as needed. for anxiety, Disp: 60 tablet, Rfl: 1   doxycycline (VIBRA-TABS) 100 MG tablet, Take 1 tablet (100 mg total) by mouth 2 (two) times daily., Disp: 14 tablet, Rfl: 0   fluconazole (DIFLUCAN) 150 MG tablet, Take 1 tablet PO once. Repeat in 3 days if needed., Disp: 2 tablet, Rfl: 0   methylphenidate (CONCERTA) 36 MG PO CR tablet, Take 1 tablet (36 mg total) by mouth daily. October 2023, Disp: 30 tablet, Rfl: 0   methylphenidate (RITALIN) 10 MG tablet, Take 1 tablet (10 mg total) by mouth daily as needed., Disp: 30 tablet, Rfl: 0   norgestimate-ethinyl estradiol (ORTHO-CYCLEN) 0.25-35 MG-MCG tablet, Take 1 tablet by mouth daily., Disp: 84 tablet, Rfl: 3   ondansetron (ZOFRAN ODT) 4 MG disintegrating tablet, Take 1 tablet (4 mg total) by mouth every 8 (eight) hours as needed for nausea or vomiting., Disp: 30 tablet, Rfl: 1   venlafaxine XR (EFFEXOR XR) 37.5 MG 24 hr capsule, 1 cap po daily x 7 days then increase to 2 caps po daily x 7 days, Disp: 30 capsule, Rfl: 0   venlafaxine XR (EFFEXOR-XR) 150 MG 24 hr capsule, Take 1 capsule (150 mg  total) by mouth daily with breakfast., Disp: 30 capsule, Rfl: 2  Observations/Objective: Patient is well-developed, well-nourished in no acute distress.  Resting comfortably  at home.  Head is normocephalic, atraumatic.  No labored breathing.  Speech is clear and coherent with logical content.  Patient is alert and oriented at baseline.  Voice hoarse Wet cough  Assessment and Plan:  Melissa [name] in today with chief complaint of Pneumonia   1. Pneumonia of upper lobe due to infectious organism, unspecified laterality 1. Take meds as prescribed 2. Use a cool mist humidifier especially during the winter months and when heat has been humid. 3. Use saline nose sprays frequently 4. Saline irrigations of the nose can be very helpful if done frequently.  * 4X daily for 1 week*  * Use of a nettie pot can be helpful with this. Follow directions with this* 5. Drink plenty of  fluids 6. Keep thermostat turn down low 7.For any cough or congestion- mucinex 8. For fever or aces or pains- take tylenol or ibuprofen appropriate for age and weight.  * for fevers greater than 101 orally you may alternate ibuprofen and tylenol every  3 hours.     Meds ordered this encounter  Medications   azithromycin (ZITHROMAX Z-PAK) 250 MG tablet    Sig: As directed    Dispense:  6 tablet    Refill:  0    Order Specific Question:   Supervising Provider    Answer:   Chase Picket [[id number]]   ondansetron (ZOFRAN) 4 MG tablet    Sig: Take 1 tablet (4 mg total) by mouth every 8 (eight) hours as needed for nausea or vomiting.    Dispense:  20 tablet    Refill:  0    Order Specific Question:   Supervising Provider    Answer:   Chase Picket [id number]   albuterol (PROVENTIL) (2.5 MG/3ML) 0.083% nebulizer solution    Sig: Take 3 mLs (2.5 mg total) by nebulization every 6 (six) hours as needed for wheezing or shortness of breath.    Dispense:  150 mL    Refill:  1    Order Specific Question:    Supervising Provider    Answer:   Chase Picket [facility] records reviewed- EMS was called to transport patient  to Hospital. Patient actually refused to wait at hospital to be seen because was to crowded.  Follow Up Instructions: I discussed the assessment and treatment plan with the patient. The patient was provided an opportunity to ask questions and all were answered. The patient agreed with the plan and demonstrated an understanding of the instructions.  A copy of instructions were sent to the patient via MyChart.  The patient was advised to call back or seek an in-person evaluation if the symptoms worsen or if the condition fails to improve as anticipated.  Time:  I spent 9 minutes with the patient via telehealth technology discussing the above problems/concerns.    Melissa Hassell Done, FNP

## 2022-06-18 ENCOUNTER — Emergency Department (HOSPITAL_BASED_OUTPATIENT_CLINIC_OR_DEPARTMENT_OTHER)
Admission: EM | Admit: 2022-06-18 | Discharge: 2022-06-18 | Disposition: A | Discharge status: Home / Self Care | Payer: PRIVATE HEALTH INSURANCE | Source: Home / Self Care | Attending: Emergency Medicine | Admitting: Emergency Medicine

## 2022-06-18 ENCOUNTER — Other Ambulatory Visit: Payer: Self-pay

## 2022-06-18 ENCOUNTER — Emergency Department (HOSPITAL_BASED_OUTPATIENT_CLINIC_OR_DEPARTMENT_OTHER): Payer: PRIVATE HEALTH INSURANCE | Admitting: Radiology

## 2022-06-18 ENCOUNTER — Encounter (HOSPITAL_BASED_OUTPATIENT_CLINIC_OR_DEPARTMENT_OTHER): Payer: Self-pay

## 2022-06-18 ENCOUNTER — Other Ambulatory Visit (HOSPITAL_BASED_OUTPATIENT_CLINIC_OR_DEPARTMENT_OTHER): Payer: Self-pay

## 2022-06-18 DIAGNOSIS — J189 Pneumonia, unspecified organism: Secondary | ICD-10-CM | POA: Insufficient documentation

## 2022-06-18 DIAGNOSIS — A419 Sepsis, unspecified organism: Secondary | ICD-10-CM | POA: Diagnosis not present

## 2022-06-18 LAB — BASIC METABOLIC PANEL
Anion gap: 12 (ref 5–15)
BUN: 18 mg/dL (ref 6–20)
CO2: 25 mmol/L (ref 22–32)
Calcium: 9.3 mg/dL (ref 8.9–10.3)
Chloride: 102 mmol/L (ref 98–111)
Creatinine, Ser: 0.61 mg/dL (ref 0.44–1.00)
GFR, Estimated: >60 mL/min (ref >60)
Glucose, Bld: 138 mg/dL — ABNORMAL HIGH (ref 70–99)
Potassium: 3.9 mmol/L (ref 3.5–5.1)
Sodium: 139 mmol/L (ref 135–145)

## 2022-06-18 LAB — CBC
HCT: 39.1 % (ref 36.0–46.0)
Hemoglobin: 12.6 g/dL (ref 12.0–15.0)
MCH: 25.3 pg — ABNORMAL LOW (ref 26.0–34.0)
MCHC: 32.2 g/dL (ref 30.0–36.0)
MCV: 78.4 fL — ABNORMAL LOW (ref 80.0–100.0)
Platelets: 365 10*3/uL (ref 150–400)
RBC: 4.99 MIL/uL (ref 3.87–5.11)
RDW: 15 % (ref 11.5–15.5)
WBC: 9 10*3/uL (ref 4.0–10.5)
nRBC: 0 % (ref 0.0–0.2)

## 2022-06-18 MED ORDER — AMOXICILLIN-POT CLAVULANATE 875-125 MG PO TABS: 1.0000 | ORAL_TABLET | Freq: Two times a day (BID) | ORAL | 0 refills | Status: DC

## 2022-06-18 MED ORDER — ALBUTEROL SULFATE HFA 108 (90 BASE) MCG/ACT IN AERS: 1.0000 | INHALATION_SPRAY | Freq: Four times a day (QID) | RESPIRATORY_TRACT | 0 refills | Status: DC | PRN

## 2022-06-18 MED ORDER — SODIUM CHLORIDE 0.9 % IV BOLUS
1000.0000 mL | Freq: Once | INTRAVENOUS | Status: AC
  Administered 2022-06-18: 1000 mL via INTRAVENOUS

## 2022-06-18 MED ORDER — ONDANSETRON HCL 4 MG/2ML IJ SOLN
4.0000 mg | Freq: Once | INTRAMUSCULAR | Status: AC
  Administered 2022-06-18: 4 mg via INTRAVENOUS
  Filled 2022-06-18: qty 2

## 2022-06-18 MED ORDER — SODIUM CHLORIDE 0.9 % IV SOLN
2.0000 g | Freq: Once | INTRAVENOUS | Status: AC
  Administered 2022-06-18: 2 g via INTRAVENOUS
  Filled 2022-06-18: qty 20

## 2022-06-18 MED ORDER — BENZONATATE 100 MG PO CAPS: 100.0000 mg | ORAL_CAPSULE | Freq: Three times a day (TID) | ORAL | 0 refills | Status: DC

## 2022-06-18 MED ORDER — ONDANSETRON 8 MG PO TBDP: 8.0000 mg | ORAL_TABLET | Freq: Three times a day (TID) | ORAL | 0 refills | Status: DC | PRN

## 2022-06-18 MED ORDER — ALBUTEROL SULFATE (5 MG/ML) 0.5% IN NEBU: 2.5000 mg | INHALATION_SOLUTION | Freq: Four times a day (QID) | RESPIRATORY_TRACT | 0 refills | Status: DC | PRN

## 2022-06-18 NOTE — ED Provider Notes (Signed)
[facility] EMERGENCY DEPT Provider Note   CSN: [id number] Arrival date & time: 06/18/22  1000     History  Chief Complaint  Patient presents with   Cough    Melissa [name] is a 31 y.o. female.   Cough    Patient has been having trouble with cough shortness of breath and nausea since last Friday.  Patient has had some intermittent fevers as well.  Today she had some pain in her chest when using her nebulizer.  Patient went to an urgent care yesterday.  She had an x-ray and was diagnosed with pneumonia.  Patient was given a dose of antibiotics.  They were concerned at the urgent care that she may require hospitalization so she was sent to any ED.  Unfortunately patient had to wait for prolonged period of time so she ended up leaving before being evaluated.  Patient had a video visit yesterday.  They did prescribe antibiotics and nebulizer medications and Zofran.  With the chest discomfort she had today and her return of general malaise she came to the ED for evaluation  Home Medications Prior to Admission medications   Medication Sig Start Date End Date Taking? Authorizing Provider  amoxicillin-clavulanate (AUGMENTIN) 875-125 MG tablet Take 1 tablet by mouth every 12 (twelve) hours. 06/18/22  Yes Dorie Rank, MD  benzonatate (TESSALON) 100 MG capsule Take 1 capsule (100 mg total) by mouth every 8 (eight) hours. 06/18/22  Yes Dorie Rank, MD  ondansetron (ZOFRAN-ODT) 8 MG disintegrating tablet Take 1 tablet (8 mg total) by mouth every 8 (eight) hours as needed for nausea or vomiting. 06/18/22  Yes Dorie Rank, MD  albuterol (PROVENTIL) (2.5 MG/3ML) 0.083% nebulizer solution Take 3 mLs (2.5 mg total) by nebulization every 6 (six) hours as needed for wheezing or shortness of breath. 06/17/22   Hassell Done, Mary-Margaret, FNP  albuterol (VENTOLIN HFA) 108 (90 Base) MCG/ACT inhaler Inhale 1-2 puffs into the lungs every 6 (six) hours as needed for wheezing or shortness of breath.  06/06/20   [name], MD  azithromycin (ZITHROMAX Z-PAK) 250 MG tablet As directed 06/17/22   Hassell Done, Mary-Margaret, FNP  busPIRone (BUSPAR) 15 MG tablet Take 1 tablet (15 mg total) by mouth 3 (three) times daily. 06/16/22   [name], MD  diazepam (VALIUM) 10 MG tablet Take 1 tablet (10 mg total) by mouth every 12 (twelve) hours as needed. for anxiety 06/16/22   [name], MD  doxycycline (VIBRA-TABS) 100 MG tablet Take 1 tablet (100 mg total) by mouth 2 (two) times daily. 06/07/22   Brunetta Jeans, PA-C  fluconazole (DIFLUCAN) 150 MG tablet Take 1 tablet PO once. Repeat in 3 days if needed. 06/07/22   Brunetta Jeans, PA-C  methylphenidate (CONCERTA) 36 MG PO CR tablet Take 1 tablet (36 mg total) by mouth daily. October 2023 06/16/22   [name], MD  methylphenidate (RITALIN) 10 MG tablet Take 1 tablet (10 mg total) by mouth daily as needed. 06/16/22   [name], MD  norgestimate-ethinyl estradiol (ORTHO-CYCLEN) 0.25-35 MG-MCG tablet Take 1 tablet by mouth daily. 03/19/22   Terrilyn Saver, NP  ondansetron (ZOFRAN) 4 MG tablet Take 1 tablet (4 mg total) by mouth every 8 (eight) hours as needed for nausea or vomiting. 06/17/22   Hassell Done Mary-Margaret, FNP  venlafaxine XR (EFFEXOR XR) 37.5 MG 24 hr capsule 1 cap po daily x 7 days then increase to 2 caps po daily x 7 days 05/20/22   Charlett Blake,  Bonnita Levan, MD  venlafaxine XR (EFFEXOR-XR) 150 MG 24 hr capsule Take 1 capsule (150 mg total) by mouth daily with breakfast. 04/14/22   [name], MD      Allergies    Patient has no known allergies.    Review of Systems   Review of Systems  Respiratory:  Positive for cough.     Physical Exam Updated Vital Signs BP (!) 137/101 (BP Location: Right Arm)   Pulse 96   Temp 98.7 F (37.1 C) (Oral)   Resp 18   Ht 1.6 m ('5\' 3"'$ )   Wt 97.1 kg   SpO2 99%   BMI 37.91 kg/m  Physical Exam Vitals and nursing note reviewed.  Constitutional:      Appearance: She is  well-developed. She is not diaphoretic.  HENT:     Head: Normocephalic and atraumatic.     Right Ear: External ear normal.     Left Ear: External ear normal.  Eyes:     General: No scleral icterus.       Right eye: No discharge.        Left eye: No discharge.     Conjunctiva/sclera: Conjunctivae normal.  Neck:     Trachea: No tracheal deviation.  Cardiovascular:     Rate and Rhythm: Normal rate and regular rhythm.  Pulmonary:     Effort: Pulmonary effort is normal. No respiratory distress.     Breath sounds: No stridor. Rales present. No wheezing.     Comments: Left lower lobe Abdominal:     General: Bowel sounds are normal. There is no distension.     Palpations: Abdomen is soft.     Tenderness: There is no abdominal tenderness. There is no guarding or rebound.  Musculoskeletal:        General: No tenderness or deformity.     Cervical back: Neck supple.  Skin:    General: Skin is warm and dry.     Findings: No rash.  Neurological:     General: No focal deficit present.     Mental Status: She is alert.     Cranial Nerves: No cranial nerve deficit (no facial droop, extraocular movements intact, no slurred speech).     Sensory: No sensory deficit.     Motor: No abnormal muscle tone or seizure activity.     Coordination: Coordination normal.  Psychiatric:        Mood and Affect: Mood normal.     ED Results / Procedures / Treatments   Labs (all labs ordered are listed, but only abnormal results are displayed) Labs Reviewed  CBC - Abnormal; Notable for the following components:      Result Value   MCV 78.4 (*)    MCH 25.3 (*)    All other components within normal limits  BASIC METABOLIC PANEL - Abnormal; Notable for the following components:   Glucose, Bld 138 (*)    All other components within normal limits    EKG EKG Interpretation  Date/Time:  Wednesday June 18 2022 10:08:57 EDT Ventricular Rate:  106 PR Interval:  142 QRS Duration: 70 QT  Interval:  336 QTC Calculation: 446 R Axis:   28 Text Interpretation: Sinus tachycardia Right atrial enlargement Borderline ECG When compared with ECG of 28-Dec-2020 17:51, No significant change was found Confirmed by Dorie Rank [id number]) on 06/18/2022 10:13:08 AM  Radiology DG Chest 2 View  Result Date: 06/18/2022 CLINICAL DATA:  Several day history of productive cough with reported fevers EXAM:  CHEST - 2 VIEW COMPARISON:  Chest radiograph dated 06/17/2022 FINDINGS: Normal lung volumes. Similar left lower lobe hazy opacity. No pleural effusion or pneumothorax. Similar cardiomediastinal silhouette. The visualized skeletal structures are unremarkable. IMPRESSION: Similar left lower lobe hazy opacity, suspicious for pneumonia. Electronically Signed   By: Darrin Nipper M.D.   On: 06/18/2022 10:36    Procedures Procedures    Medications Ordered in ED Medications  sodium chloride 0.9 % bolus 1,000 mL (0 mLs Intravenous Stopped 06/18/22 1231)  cefTRIAXone (ROCEPHIN) 2 g in sodium chloride 0.9 % 100 mL IVPB (0 g Intravenous Stopped 06/18/22 1155)  ondansetron (ZOFRAN) injection 4 mg (4 mg Intravenous Given 06/18/22 1144)    ED Course/ Medical Decision Making/ A&P Clinical Course as of 06/18/22 1240  Wed Jun 18, 2022  1025 Covid and flu test negative yesterday at outside facility [JK]  1041 X-ray shows hazy opacity in the left lower lobe suspicious for pneumonia [JK]  1209 CBC(!) CBC normal [JK]  9373 Basic metabolic panel(!) Metabolic panel normal except for elevated blood sugar on this nonfasting level [JK]    Clinical Course User Index [JK] Dorie Rank, MD                           Medical Decision Making Differential diagnosis includes but not limited to pneumonia pneumothorax  Problems Addressed: Community acquired pneumonia of left lower lobe of lung: acute illness or injury that poses a threat to life or bodily functions  Amount and/or Complexity of Data Reviewed Labs: ordered.  Decision-making details documented in ED Course. Radiology: ordered and independent interpretation performed.  Risk Prescription drug management.   Patient presented to the ED for evaluation of persistent symptoms after recently being diagnosed with pneumonia.  Patient had a recent negative COVID and flu test.  No wheezing noted in the ED.  No hypoxia.  Patient's chest x-ray and presentation is consistent with early pneumonia.  Based on her evaluation today I do not feel the patient requires hospitalization for IV antibiotic treatment.  Will discharge home with course of antibiotics and medications for nausea and cough.  Patient states the azithromycin is upsetting her stomach and requests an alternative.  Wrote a prescription for Augmentin.        Final Clinical Impression(s) / ED Diagnoses Final diagnoses:  Community acquired pneumonia of left lower lobe of lung    Rx / DC Orders ED Discharge Orders          Ordered    amoxicillin-clavulanate (AUGMENTIN) 875-125 MG tablet  Every 12 hours        06/18/22 1239    ondansetron (ZOFRAN-ODT) 8 MG disintegrating tablet  Every 8 hours PRN        06/18/22 1239    benzonatate (TESSALON) 100 MG capsule  Every 8 hours        06/18/22 1239              Dorie Rank, MD 06/18/22 1240

## 2022-06-18 NOTE — Discharge Instructions (Signed)
Take the antibiotics until complete.  The Zofran is for nausea and the Tessalon is for coughing.  Rest drink plenty of fluids for the next several days.  Follow-up with your primary care doctor to be rechecked.

## 2022-06-18 NOTE — ED Triage Notes (Signed)
Pt states she has had a productive cough, sob, and nausea since last Friday. Reports intermittent fever. Pt reports some chest pain that started today after using a nebulizer. Pt was seen at an [facility] facility and was told she has pneumonia. Went to [facility] yesterday but couldn't stay due to the wait times.

## 2022-06-19 ENCOUNTER — Emergency Department (HOSPITAL_COMMUNITY)
Admission: EM | Admit: 2022-06-19 | Discharge: 2022-06-19 | Discharge status: Against Medical Advice | Payer: PRIVATE HEALTH INSURANCE | Source: Home / Self Care

## 2022-06-19 ENCOUNTER — Other Ambulatory Visit: Payer: Self-pay

## 2022-06-19 ENCOUNTER — Telehealth: Payer: Self-pay | Admitting: *Deleted

## 2022-06-19 ENCOUNTER — Emergency Department (HOSPITAL_BASED_OUTPATIENT_CLINIC_OR_DEPARTMENT_OTHER): Payer: PRIVATE HEALTH INSURANCE

## 2022-06-19 ENCOUNTER — Encounter (HOSPITAL_COMMUNITY): Payer: Self-pay

## 2022-06-19 ENCOUNTER — Encounter (HOSPITAL_BASED_OUTPATIENT_CLINIC_OR_DEPARTMENT_OTHER): Payer: Self-pay | Admitting: Emergency Medicine

## 2022-06-19 ENCOUNTER — Inpatient Hospital Stay (HOSPITAL_BASED_OUTPATIENT_CLINIC_OR_DEPARTMENT_OTHER)
Admission: EM | Admit: 2022-06-19 | Discharge: 2022-06-21 | DRG: 871 | Disposition: A | Discharge status: Home / Self Care | Payer: PRIVATE HEALTH INSURANCE | Attending: Internal Medicine | Admitting: Internal Medicine

## 2022-06-19 DIAGNOSIS — F320 Major depressive disorder, single episode, mild: Secondary | ICD-10-CM | POA: Diagnosis not present

## 2022-06-19 DIAGNOSIS — A419 Sepsis, unspecified organism: Principal | ICD-10-CM | POA: Diagnosis present

## 2022-06-19 DIAGNOSIS — J189 Pneumonia, unspecified organism: Secondary | ICD-10-CM | POA: Insufficient documentation

## 2022-06-19 DIAGNOSIS — F909 Attention-deficit hyperactivity disorder, unspecified type: Secondary | ICD-10-CM | POA: Diagnosis present

## 2022-06-19 DIAGNOSIS — Z6830 Body mass index (BMI) 30.0-30.9, adult: Secondary | ICD-10-CM

## 2022-06-19 DIAGNOSIS — Z820 Family history of epilepsy and other diseases of the nervous system: Secondary | ICD-10-CM | POA: Diagnosis not present

## 2022-06-19 DIAGNOSIS — E860 Dehydration: Secondary | ICD-10-CM

## 2022-06-19 DIAGNOSIS — Z813 Family history of other psychoactive substance abuse and dependence: Secondary | ICD-10-CM

## 2022-06-19 DIAGNOSIS — R32 Unspecified urinary incontinence: Secondary | ICD-10-CM | POA: Diagnosis present

## 2022-06-19 DIAGNOSIS — K219 Gastro-esophageal reflux disease without esophagitis: Secondary | ICD-10-CM | POA: Diagnosis present

## 2022-06-19 DIAGNOSIS — E876 Hypokalemia: Secondary | ICD-10-CM

## 2022-06-19 DIAGNOSIS — Z83719 Family history of colon polyps, unspecified: Secondary | ICD-10-CM

## 2022-06-19 DIAGNOSIS — Z801 Family history of malignant neoplasm of trachea, bronchus and lung: Secondary | ICD-10-CM | POA: Diagnosis not present

## 2022-06-19 DIAGNOSIS — E282 Polycystic ovarian syndrome: Secondary | ICD-10-CM | POA: Diagnosis present

## 2022-06-19 DIAGNOSIS — F419 Anxiety disorder, unspecified: Secondary | ICD-10-CM | POA: Diagnosis present

## 2022-06-19 DIAGNOSIS — Z833 Family history of diabetes mellitus: Secondary | ICD-10-CM | POA: Diagnosis not present

## 2022-06-19 DIAGNOSIS — Z1152 Encounter for screening for COVID-19: Secondary | ICD-10-CM | POA: Diagnosis not present

## 2022-06-19 DIAGNOSIS — F32A Depression, unspecified: Secondary | ICD-10-CM | POA: Diagnosis present

## 2022-06-19 DIAGNOSIS — Z800 Family history of malignant neoplasm of digestive organs: Secondary | ICD-10-CM | POA: Diagnosis not present

## 2022-06-19 DIAGNOSIS — J45909 Unspecified asthma, uncomplicated: Secondary | ICD-10-CM | POA: Diagnosis present

## 2022-06-19 DIAGNOSIS — Z818 Family history of other mental and behavioral disorders: Secondary | ICD-10-CM

## 2022-06-19 DIAGNOSIS — E66813 Obesity, class 3: Secondary | ICD-10-CM | POA: Diagnosis present

## 2022-06-19 DIAGNOSIS — Z79899 Other long term (current) drug therapy: Secondary | ICD-10-CM

## 2022-06-19 DIAGNOSIS — R1013 Epigastric pain: Secondary | ICD-10-CM | POA: Diagnosis present

## 2022-06-19 DIAGNOSIS — Z825 Family history of asthma and other chronic lower respiratory diseases: Secondary | ICD-10-CM | POA: Diagnosis not present

## 2022-06-19 DIAGNOSIS — Z8249 Family history of ischemic heart disease and other diseases of the circulatory system: Secondary | ICD-10-CM | POA: Diagnosis not present

## 2022-06-19 DIAGNOSIS — E669 Obesity, unspecified: Secondary | ICD-10-CM | POA: Diagnosis present

## 2022-06-19 DIAGNOSIS — R0602 Shortness of breath: Secondary | ICD-10-CM | POA: Insufficient documentation

## 2022-06-19 DIAGNOSIS — Z803 Family history of malignant neoplasm of breast: Secondary | ICD-10-CM

## 2022-06-19 DIAGNOSIS — Z5321 Procedure and treatment not carried out due to patient leaving prior to being seen by health care provider: Secondary | ICD-10-CM | POA: Insufficient documentation

## 2022-06-19 LAB — COMPREHENSIVE METABOLIC PANEL
ALT: 29 U/L (ref 0–44)
AST: 25 U/L (ref 15–41)
Albumin: 3.2 g/dL — ABNORMAL LOW (ref 3.5–5.0)
Alkaline Phosphatase: 61 U/L (ref 38–126)
Anion gap: 9 (ref 5–15)
BUN: 16 mg/dL (ref 6–20)
CO2: 22 mmol/L (ref 22–32)
Calcium: 7.6 mg/dL — ABNORMAL LOW (ref 8.9–10.3)
Chloride: 108 mmol/L (ref 98–111)
Creatinine, Ser: 0.79 mg/dL (ref 0.44–1.00)
GFR, Estimated: >60 mL/min (ref >60)
Glucose, Bld: 85 mg/dL (ref 70–99)
Potassium: 3.2 mmol/L — ABNORMAL LOW (ref 3.5–5.1)
Sodium: 139 mmol/L (ref 135–145)
Total Bilirubin: 0.4 mg/dL (ref 0.3–1.2)
Total Protein: 6.6 g/dL (ref 6.5–8.1)

## 2022-06-19 LAB — CBC WITH DIFFERENTIAL/PLATELET
Abs Immature Granulocytes: 0 10*3/uL (ref 0.00–0.07)
Abs Immature Granulocytes: 0.03 10*3/uL (ref 0.00–0.07)
Basophils Absolute: 0 10*3/uL (ref 0.0–0.1)
Basophils Absolute: 0 10*3/uL (ref 0.0–0.1)
Basophils Relative: 0 %
Basophils Relative: 0 %
Eosinophils Absolute: 0 10*3/uL (ref 0.0–0.5)
Eosinophils Absolute: 0 10*3/uL (ref 0.0–0.5)
Eosinophils Relative: 0 %
Eosinophils Relative: 0 %
HCT: 38.4 % (ref 36.0–46.0)
HCT: 31.8 % — ABNORMAL LOW (ref 36.0–46.0)
Hemoglobin: 12.3 g/dL (ref 12.0–15.0)
Hemoglobin: 10.2 g/dL — ABNORMAL LOW (ref 12.0–15.0)
Immature Granulocytes: 0 %
Lymphocytes Relative: 29 %
Lymphocytes Relative: 26 %
Lymphs Abs: 3.4 10*3/uL (ref 0.7–4.0)
Lymphs Abs: 2.2 10*3/uL (ref 0.7–4.0)
MCH: 25.5 pg — ABNORMAL LOW (ref 26.0–34.0)
MCH: 25.1 pg — ABNORMAL LOW (ref 26.0–34.0)
MCHC: 32 g/dL (ref 30.0–36.0)
MCHC: 32.1 g/dL (ref 30.0–36.0)
MCV: 79.7 fL — ABNORMAL LOW (ref 80.0–100.0)
MCV: 78.1 fL — ABNORMAL LOW (ref 80.0–100.0)
Monocytes Absolute: 0.5 10*3/uL (ref 0.1–1.0)
Monocytes Absolute: 0.7 10*3/uL (ref 0.1–1.0)
Monocytes Relative: 4 %
Monocytes Relative: 8 %
Neutro Abs: 7.8 10*3/uL — ABNORMAL HIGH (ref 1.7–7.7)
Neutro Abs: 5.7 10*3/uL (ref 1.7–7.7)
Neutrophils Relative %: 67 %
Neutrophils Relative %: 66 %
Platelets: 380 10*3/uL (ref 150–400)
Platelets: 341 10*3/uL (ref 150–400)
RBC: 4.82 MIL/uL (ref 3.87–5.11)
RBC: 4.07 MIL/uL (ref 3.87–5.11)
RDW: 15.1 % (ref 11.5–15.5)
RDW: 15.1 % (ref 11.5–15.5)
WBC: 11.6 10*3/uL — ABNORMAL HIGH (ref 4.0–10.5)
WBC: 8.7 10*3/uL (ref 4.0–10.5)
nRBC: 0 % (ref 0.0–0.2)
nRBC: 0 /100 WBC
nRBC: 0 % (ref 0.0–0.2)

## 2022-06-19 LAB — URINALYSIS, MICROSCOPIC (REFLEX)

## 2022-06-19 LAB — CREATININE, SERUM
Creatinine, Ser: 0.74 mg/dL (ref 0.44–1.00)
GFR, Estimated: >60 mL/min (ref >60)

## 2022-06-19 LAB — TROPONIN I (HIGH SENSITIVITY): Troponin I (High Sensitivity): 4 ng/L (ref <18)

## 2022-06-19 LAB — RESP PANEL BY RT-PCR (FLU A&B, COVID) ARPGX2
Influenza A by PCR: NEGATIVE
Influenza B by PCR: NEGATIVE
SARS Coronavirus 2 by RT PCR: NEGATIVE

## 2022-06-19 LAB — CBC
HCT: 33.8 % — ABNORMAL LOW (ref 36.0–46.0)
Hemoglobin: 10.6 g/dL — ABNORMAL LOW (ref 12.0–15.0)
MCH: 25 pg — ABNORMAL LOW (ref 26.0–34.0)
MCHC: 31.4 g/dL (ref 30.0–36.0)
MCV: 79.7 fL — ABNORMAL LOW (ref 80.0–100.0)
Platelets: 345 10*3/uL (ref 150–400)
RBC: 4.24 MIL/uL (ref 3.87–5.11)
RDW: 15.2 % (ref 11.5–15.5)
WBC: 7.7 10*3/uL (ref 4.0–10.5)
nRBC: 0 % (ref 0.0–0.2)

## 2022-06-19 LAB — URINALYSIS, ROUTINE W REFLEX MICROSCOPIC
Bilirubin Urine: NEGATIVE
Glucose, UA: NEGATIVE mg/dL
Ketones, ur: NEGATIVE mg/dL
Leukocytes,Ua: NEGATIVE
Nitrite: NEGATIVE
Protein, ur: NEGATIVE mg/dL
Specific Gravity, Urine: 1.02 (ref 1.005–1.030)
pH: 6 (ref 5.0–8.0)

## 2022-06-19 LAB — BASIC METABOLIC PANEL
Anion gap: 14 (ref 5–15)
BUN: 19 mg/dL (ref 6–20)
CO2: 22 mmol/L (ref 22–32)
Calcium: 9.1 mg/dL (ref 8.9–10.3)
Chloride: 105 mmol/L (ref 98–111)
Creatinine, Ser: 0.97 mg/dL (ref 0.44–1.00)
GFR, Estimated: >60 mL/min (ref >60)
Glucose, Bld: 113 mg/dL — ABNORMAL HIGH (ref 70–99)
Potassium: 3.3 mmol/L — ABNORMAL LOW (ref 3.5–5.1)
Sodium: 141 mmol/L (ref 135–145)

## 2022-06-19 LAB — PROTIME-INR
INR: 1.1 (ref 0.8–1.2)
Prothrombin Time: 13.7 seconds (ref 11.4–15.2)

## 2022-06-19 LAB — LACTIC ACID, PLASMA
Lactic Acid, Venous: 2 mmol/L (ref 0.5–1.9)
Lactic Acid, Venous: 0.5 mmol/L (ref 0.5–1.9)

## 2022-06-19 LAB — PREGNANCY, URINE: Preg Test, Ur: NEGATIVE

## 2022-06-19 LAB — APTT: aPTT: <20 seconds — ABNORMAL LOW (ref 24–36)

## 2022-06-19 LAB — D-DIMER, QUANTITATIVE: D-Dimer, Quant: 0.52 ug/mL-FEU — ABNORMAL HIGH (ref 0.00–0.50)

## 2022-06-19 MED ORDER — DIAZEPAM 5 MG PO TABS
5.0000 mg | ORAL_TABLET | Freq: Two times a day (BID) | ORAL | Status: DC | PRN
  Administered 2022-06-20 – 2022-06-21 (×3): 5 mg via ORAL
  Filled 2022-06-19 (×3): qty 1

## 2022-06-19 MED ORDER — POTASSIUM CHLORIDE 2 MEQ/ML IV SOLN
INTRAVENOUS | Status: DC
  Filled 2022-06-19 (×4): qty 1000

## 2022-06-19 MED ORDER — ALBUTEROL SULFATE (2.5 MG/3ML) 0.083% IN NEBU: 2.5000 mg | INHALATION_SOLUTION | RESPIRATORY_TRACT | Status: DC | PRN

## 2022-06-19 MED ORDER — LACTATED RINGERS IV BOLUS (SEPSIS)
1000.0000 mL | Freq: Once | INTRAVENOUS | Status: AC
  Administered 2022-06-19: 1000 mL via INTRAVENOUS

## 2022-06-19 MED ORDER — ACETAMINOPHEN 500 MG PO TABS
1000.0000 mg | ORAL_TABLET | Freq: Once | ORAL | Status: AC
  Administered 2022-06-19: 1000 mg via ORAL
  Filled 2022-06-19: qty 2

## 2022-06-19 MED ORDER — SODIUM CHLORIDE 0.9 % IV SOLN
500.0000 mg | Freq: Once | INTRAVENOUS | Status: AC
  Administered 2022-06-19: 500 mg via INTRAVENOUS
  Filled 2022-06-19: qty 5

## 2022-06-19 MED ORDER — IPRATROPIUM-ALBUTEROL 0.5-2.5 (3) MG/3ML IN SOLN
3.0000 mL | Freq: Three times a day (TID) | RESPIRATORY_TRACT | Status: DC
  Administered 2022-06-20 – 2022-06-21 (×4): 3 mL via RESPIRATORY_TRACT
  Filled 2022-06-19 (×4): qty 3

## 2022-06-19 MED ORDER — POTASSIUM CHLORIDE CRYS ER 20 MEQ PO TBCR
40.0000 meq | EXTENDED_RELEASE_TABLET | Freq: Once | ORAL | Status: DC
  Filled 2022-06-19: qty 2

## 2022-06-19 MED ORDER — LACTATED RINGERS IV SOLN: INTRAVENOUS | Status: DC

## 2022-06-19 MED ORDER — ONDANSETRON HCL 4 MG/2ML IJ SOLN
4.0000 mg | Freq: Four times a day (QID) | INTRAMUSCULAR | Status: DC | PRN
  Administered 2022-06-20 – 2022-06-21 (×5): 4 mg via INTRAVENOUS
  Filled 2022-06-19 (×5): qty 2

## 2022-06-19 MED ORDER — METOCLOPRAMIDE HCL 5 MG/ML IJ SOLN
10.0000 mg | Freq: Once | INTRAMUSCULAR | Status: AC
  Administered 2022-06-19: 10 mg via INTRAVENOUS
  Filled 2022-06-19: qty 2

## 2022-06-19 MED ORDER — VENLAFAXINE HCL ER 150 MG PO CP24
150.0000 mg | ORAL_CAPSULE | Freq: Every day | ORAL | Status: DC
  Filled 2022-06-19: qty 1

## 2022-06-19 MED ORDER — BENZONATATE 100 MG PO CAPS
200.0000 mg | ORAL_CAPSULE | Freq: Three times a day (TID) | ORAL | Status: DC
  Administered 2022-06-19 – 2022-06-21 (×5): 200 mg via ORAL
  Filled 2022-06-19 (×5): qty 2

## 2022-06-19 MED ORDER — ALBUTEROL SULFATE HFA 108 (90 BASE) MCG/ACT IN AERS: 2.0000 | INHALATION_SPRAY | RESPIRATORY_TRACT | Status: DC | PRN

## 2022-06-19 MED ORDER — ENOXAPARIN SODIUM 40 MG/0.4ML IJ SOSY
40.0000 mg | PREFILLED_SYRINGE | INTRAMUSCULAR | Status: DC
  Administered 2022-06-19 – 2022-06-20 (×2): 40 mg via SUBCUTANEOUS
  Filled 2022-06-19 (×2): qty 0.4

## 2022-06-19 MED ORDER — IPRATROPIUM-ALBUTEROL 0.5-2.5 (3) MG/3ML IN SOLN
3.0000 mL | Freq: Four times a day (QID) | RESPIRATORY_TRACT | Status: DC
  Administered 2022-06-19: 3 mL via RESPIRATORY_TRACT
  Filled 2022-06-19: qty 3

## 2022-06-19 MED ORDER — ACETAMINOPHEN 325 MG PO TABS
650.0000 mg | ORAL_TABLET | Freq: Four times a day (QID) | ORAL | Status: DC | PRN
  Administered 2022-06-19 – 2022-06-20 (×3): 650 mg via ORAL
  Filled 2022-06-19 (×3): qty 2

## 2022-06-19 MED ORDER — SODIUM CHLORIDE 0.9 % IV SOLN
500.0000 mg | INTRAVENOUS | Status: DC
  Administered 2022-06-20: 500 mg via INTRAVENOUS
  Filled 2022-06-19 (×2): qty 5

## 2022-06-19 MED ORDER — PANTOPRAZOLE SODIUM 40 MG PO TBEC
40.0000 mg | DELAYED_RELEASE_TABLET | Freq: Every day | ORAL | Status: DC
  Administered 2022-06-19 – 2022-06-21 (×3): 40 mg via ORAL
  Filled 2022-06-19 (×3): qty 1

## 2022-06-19 MED ORDER — SODIUM CHLORIDE 0.9 % IV SOLN
2.0000 g | INTRAVENOUS | Status: DC
  Administered 2022-06-20 – 2022-06-21 (×2): 2 g via INTRAVENOUS
  Filled 2022-06-19 (×2): qty 20

## 2022-06-19 MED ORDER — BUSPIRONE HCL 5 MG PO TABS
15.0000 mg | ORAL_TABLET | Freq: Three times a day (TID) | ORAL | Status: DC
  Administered 2022-06-19 – 2022-06-21 (×5): 15 mg via ORAL
  Filled 2022-06-19 (×5): qty 1

## 2022-06-19 MED ORDER — SODIUM CHLORIDE 0.9 % IV SOLN
1.0000 g | Freq: Once | INTRAVENOUS | Status: AC
  Administered 2022-06-19: 1 g via INTRAVENOUS
  Filled 2022-06-19: qty 10

## 2022-06-19 NOTE — ED Notes (Signed)
Patient walked out.

## 2022-06-19 NOTE — ED Notes (Signed)
Pt awake and alert- GCS 15.  Reports nausea at this time; no other complaints-  [facility] advised will medicate with anti-emetic on the truck.  Pt having episodes of stress incontinence with coughing -- while preparing to transfer from room stretcher to [facility] stretcher pt reports "I'm wet" -- pt provided peri-spray with 2 clean clothes; mother has provided pt with adult brief - pt now completing pericare prior to xfer.  RR even and unlabored on RA at this time; O2 sats low 90s on RA-- no cough noted at this time. Abdomen soft, nontender.  Skin warm dry and intact.  LR now infusing '@125ml'$ /hr

## 2022-06-19 NOTE — ED Notes (Signed)
First attempt to call report - was advised receiving nurse not answering phone - was advised that message would be passed to the receiving nurse to call back for report

## 2022-06-19 NOTE — ED Triage Notes (Signed)
Pt reports shortness of breath since last Friday. She states she was diagnosed with pneumonia yesterday. Brought back to triage by staff on arrival with difficulty breathing and lethargic. Has been having fevers at home but took tylenol pta.

## 2022-06-19 NOTE — Telephone Encounter (Signed)
Patient called.    "patient was in ED but she was struggling to breath and ended up using bathroom on herself and had to go home so she doesn't know what the results are....not sure if a provider has reviewed them yet? can someone check."  Spoke with patient she sounded really out of breath and she wanted someone to go over labs from ER cause she left because she wet herself an was left there.  D-dimer was elevated and we advised her that she needed to go back to ER immediately or call 911.  Advised that it could possibly be a clot.  Also if she is wetting herself that is bad and needs to be seen.  She had her mother I believe on the phone we both advise she to be seen again.  She said she will call the ambulance.

## 2022-06-19 NOTE — ED Notes (Signed)
[facility] now at bedside  

## 2022-06-19 NOTE — ED Notes (Signed)
EKG in triage. Not crossing over

## 2022-06-19 NOTE — ED Notes (Signed)
Pt is refusing PO tylenol and says she is unable to keep anything down. RN offered to give with crackers but pt still refused.

## 2022-06-19 NOTE — ED Provider Notes (Signed)
[place] EMERGENCY DEPARTMENT Provider Note   CSN: [id number] Arrival date & time: 06/19/22  1238     History  Chief Complaint  Patient presents with   Shortness of Breath    Melissa [name] is a 31 y.o. female.  She is here with continued cough shortness of breath nausea vomiting and incontinence of urine when coughing its been going on for a week.  She has been in the ED multiple times treated with IM and IV Rocephin.  Continues to feel lousy.  Having fevers and inability to keep down any medications.  The history is provided by the patient.  Shortness of Breath Severity:  Severe Onset quality:  Gradual Duration:  1 week Timing:  Constant Progression:  Worsening Chronicity:  New Relieved by:  Nothing Worsened by:  Activity and coughing Ineffective treatments:  Rest Associated symptoms: cough, fever, sputum production and vomiting   Associated symptoms: no abdominal pain, no chest pain, no hemoptysis and no rash   Risk factors: no tobacco use        Home Medications Prior to Admission medications   Medication Sig Start Date End Date Taking? Authorizing Provider  albuterol (PROVENTIL) (5 MG/ML) 0.5% nebulizer solution Take 0.5 mLs (2.5 mg total) by nebulization every 6 (six) hours as needed for wheezing or shortness of breath. 06/18/22   Dorie Rank, MD  albuterol (VENTOLIN HFA) 108 (90 Base) MCG/ACT inhaler Inhale 1-2 puffs into the lungs every 6 (six) hours as needed for wheezing or shortness of breath. 06/18/22   Dorie Rank, MD  amoxicillin-clavulanate (AUGMENTIN) 875-125 MG tablet Take 1 tablet by mouth every 12 (twelve) hours. 06/18/22   Dorie Rank, MD  azithromycin (ZITHROMAX Z-PAK) 250 MG tablet As directed 06/17/22   Hassell Done, Mary-Margaret, FNP  benzonatate (TESSALON) 100 MG capsule Take 1 capsule (100 mg total) by mouth every 8 (eight) hours. 06/18/22   Dorie Rank, MD  busPIRone (BUSPAR) 15 MG tablet Take 1 tablet (15 mg total) by mouth 3 (three) times  daily. 06/16/22   [name], MD  diazepam (VALIUM) 10 MG tablet Take 1 tablet (10 mg total) by mouth every 12 (twelve) hours as needed. for anxiety 06/16/22   [name], MD  doxycycline (VIBRA-TABS) 100 MG tablet Take 1 tablet (100 mg total) by mouth 2 (two) times daily. 06/07/22   Brunetta Jeans, PA-C  fluconazole (DIFLUCAN) 150 MG tablet Take 1 tablet PO once. Repeat in 3 days if needed. 06/07/22   Brunetta Jeans, PA-C  methylphenidate (CONCERTA) 36 MG PO CR tablet Take 1 tablet (36 mg total) by mouth daily. October 2023 06/16/22   [name], MD  methylphenidate (RITALIN) 10 MG tablet Take 1 tablet (10 mg total) by mouth daily as needed. 06/16/22   [name], MD  norgestimate-ethinyl estradiol (ORTHO-CYCLEN) 0.25-35 MG-MCG tablet Take 1 tablet by mouth daily. 03/19/22   Terrilyn Saver, NP  ondansetron (ZOFRAN) 4 MG tablet Take 1 tablet (4 mg total) by mouth every 8 (eight) hours as needed for nausea or vomiting. 06/17/22   Hassell Done, Mary-Margaret, FNP  ondansetron (ZOFRAN-ODT) 8 MG disintegrating tablet Take 1 tablet (8 mg total) by mouth every 8 (eight) hours as needed for nausea or vomiting. 06/18/22   Dorie Rank, MD  venlafaxine XR (EFFEXOR XR) 37.5 MG 24 hr capsule 1 cap po daily x 7 days then increase to 2 caps po daily x 7 days 05/20/22   [name], MD  venlafaxine XR [facility])  150 MG 24 hr capsule Take 1 capsule (150 mg total) by mouth daily with breakfast. 04/14/22   [name], MD      Allergies    Diphenhydramine    Review of Systems   Review of Systems  Constitutional:  Positive for fever.  Eyes:  Negative for visual disturbance.  Respiratory:  Positive for cough, sputum production and shortness of breath. Negative for hemoptysis.   Cardiovascular:  Negative for chest pain.  Gastrointestinal:  Positive for nausea and vomiting. Negative for abdominal pain.  Genitourinary:  Positive for frequency. Negative for dysuria.  Skin:  Negative for rash.     Physical Exam Updated Vital Signs BP 115/71 (BP Location: Left Arm)   Pulse (!) 121   Temp (!) 101.7 F (38.7 C) (Oral)   Resp (!) 28   Ht '5\' 10"'$  (1.778 m)   Wt 97.1 kg   SpO2 94%   BMI 30.71 kg/m  Physical Exam Vitals and nursing note reviewed.  Constitutional:      General: She is not in acute distress.    Appearance: Normal appearance. She is well-developed.  HENT:     Head: Normocephalic and atraumatic.  Eyes:     Conjunctiva/sclera: Conjunctivae normal.  Cardiovascular:     Rate and Rhythm: Regular rhythm. Tachycardia present.     Heart sounds: No murmur heard. Pulmonary:     Effort: Tachypnea and accessory muscle usage present. No respiratory distress.     Breath sounds: Normal breath sounds.  Abdominal:     Palpations: Abdomen is soft.     Tenderness: There is no abdominal tenderness.  Musculoskeletal:        General: No swelling. Normal range of motion.     Cervical back: Neck supple.     Right lower leg: No tenderness. No edema.     Left lower leg: No tenderness. No edema.  Skin:    General: Skin is warm and dry.     Capillary Refill: Capillary refill takes less than 2 seconds.  Neurological:     General: No focal deficit present.     Mental Status: She is alert.     Sensory: No sensory deficit.     Motor: No weakness.     ED Results / Procedures / Treatments   Labs (all labs ordered are listed, but only abnormal results are displayed) Labs Reviewed  LACTIC ACID, PLASMA - Abnormal; Notable for the following components:      Result Value   Lactic Acid, Venous 2.0 (*)    All other components within normal limits  CBC WITH DIFFERENTIAL/PLATELET - Abnormal; Notable for the following components:   RBC 5.62 (*)    MCV 79.2 (*)    MCH 24.9 (*)    All other components within normal limits  URINALYSIS, ROUTINE W REFLEX MICROSCOPIC - Abnormal; Notable for the following components:   Hgb urine dipstick SMALL (*)    All other components within normal  limits  URINALYSIS, MICROSCOPIC (REFLEX) - Abnormal; Notable for the following components:   Bacteria, UA RARE (*)    All other components within normal limits  COMPREHENSIVE METABOLIC PANEL - Abnormal; Notable for the following components:   Potassium 3.2 (*)    Calcium 7.6 (*)    Albumin 3.2 (*)    All other components within normal limits  CBC WITH DIFFERENTIAL/PLATELET - Abnormal; Notable for the following components:   Hemoglobin 10.2 (*)    HCT 31.8 (*)    MCV 78.1 (*)  MCH 25.1 (*)    All other components within normal limits  APTT - Abnormal; Notable for the following components:   aPTT <20 (*)    All other components within normal limits  RESP PANEL BY RT-PCR (FLU A&B, COVID) ARPGX2  CULTURE, BLOOD (ROUTINE X 2)  CULTURE, BLOOD (ROUTINE X 2)  URINE CULTURE  LACTIC ACID, PLASMA  PREGNANCY, URINE  PROTIME-INR    EKG None ECG not crossing in epic.  Sinus tachycardia rate of 115 nonspecific ST-T's. Radiology DG Chest 2 View  Result Date: 06/18/2022 CLINICAL DATA:  Several day history of productive cough with reported fevers EXAM: CHEST - 2 VIEW COMPARISON:  Chest radiograph dated 06/17/2022 FINDINGS: Normal lung volumes. Similar left lower lobe hazy opacity. No pleural effusion or pneumothorax. Similar cardiomediastinal silhouette. The visualized skeletal structures are unremarkable. IMPRESSION: Similar left lower lobe hazy opacity, suspicious for pneumonia. Electronically Signed   By: Darrin Nipper M.D.   On: 06/18/2022 10:36    Procedures Procedures    Medications Ordered in ED Medications  albuterol (VENTOLIN HFA) 108 (90 Base) MCG/ACT inhaler 2 puff (has no administration in time range)  lactated ringers infusion ( Intravenous New Bag/Given 06/19/22 1736)  acetaminophen (TYLENOL) tablet 1,000 mg (1,000 mg Oral Given 06/19/22 1407)  cefTRIAXone (ROCEPHIN) 1 g in sodium chloride 0.9 % 100 mL IVPB (0 g Intravenous Stopped 06/19/22 1603)  azithromycin (ZITHROMAX) 500  mg in sodium chloride 0.9 % 250 mL IVPB (0 mg Intravenous Stopped 06/19/22 1637)  lactated ringers bolus 1,000 mL (0 mLs Intravenous Stopped 06/19/22 1524)  metoCLOPramide (REGLAN) injection 10 mg (10 mg Intravenous Given 06/19/22 1551)    ED Course/ Medical Decision Making/ A&P Clinical Course as of 06/19/22 1752  Thu Jun 19, 2022  1337 Chest x-ray with possible left upper lobe infiltrate.  Awaiting radiology reading. [MB]    Clinical Course User Index [MB] Hayden Rasmussen, MD                           Medical Decision Making Amount and/or Complexity of Data Reviewed Labs: ordered. Radiology: ordered.  Risk OTC drugs. Prescription drug management. Decision regarding hospitalization.  Melissa [name] was evaluated in Emergency Department on 06/19/2022 for the symptoms described in the history of present illness. She was evaluated in the context of the global COVID-19 pandemic, which necessitated consideration that the patient might be at risk for infection with the SARS-CoV-2 virus that causes COVID-19. Institutional protocols and algorithms that pertain to the evaluation of patients at risk for COVID-19 are in a state of rapid change based on information released by regulatory bodies including the CDC and federal and state organizations. These policies and algorithms were followed during the patient's care in the ED.  This patient complains of cough shortness of breath nausea vomiting weakness; this involves an extensive number of treatment Options and is a complaint that carries with it a high risk of complications and morbidity. The differential includes pneumonia, dehydration, metabolic derangement, COVID, flu  I ordered, reviewed and interpreted labs, which included CBC with normal white count hemoglobin mildly low, chemistries with low potassium normal renal function, lactate elevated but cleared with fluids, urinalysis negative pregnancy negative COVID and flu negative I  ordered medication IV fluids IV antibiotics and reviewed PMP when indicated. I ordered imaging studies which included chest x-ray and I independently    visualized and interpreted imaging which showed left upper lobe infiltrate Previous records obtained and  reviewed including recent ED visits Cardiac monitoring reviewed, sinus tachycardia Social determinants considered, patient not seeming to cope with having pneumonia and tolerating oral antibiotics Critical Interventions: None  After the interventions stated above, I reevaluated the patient and found to be symptomatically improved although still remains tachycardic Admission and further testing considered, her care is signed out to Dr. Maryan Rued to see her response after fluids and antibiotics.  If she is doing better she possibly can be discharged otherwise may need admission for further management.          Final Clinical Impression(s) / ED Diagnoses Final diagnoses:  Community acquired pneumonia of left upper lobe of lung  Dehydration    Rx / DC Orders ED Discharge Orders     None         Hayden Rasmussen, MD 06/19/22 1756

## 2022-06-19 NOTE — ED Notes (Signed)
Successfully inserted 3 peripheral IVs (see charting) not unable to obtain enough blood return for PT/INR and blood cultures x 2. MD notified. Per provider, give fluid bolus as ordered and try again for blood return.

## 2022-06-19 NOTE — ED Provider Triage Note (Signed)
Emergency Medicine Provider Triage Evaluation Note  Melissa [name] , a 31 y.o. female  was evaluated in triage.  Pt complains of shortness of breath and not feeling well diagnosed with pneumonia was started on azithromycin, seen in emergency department yesterday was given ceftriaxone fluids and discharged home with amoxicillin.  Review of Systems  Positive: Chest pain, shortness of breath Negative: Nausea vomiting  Physical Exam  BP (!) 130/95   Pulse (!) 128   Temp 98.7 F (37.1 C) (Oral)   Resp (!) 26   SpO2 96%  Gen:   Awake, no distress   Resp:  Normal effort  MSK:   Moves extremities without difficulty  Other:    Medical Decision Making  Medically screening exam initiated at 3:58 AM.  Appropriate orders placed.  Franki Alcaide was informed that the remainder of the evaluation will be completed by another provider, this initial triage assessment does not replace that evaluation, and the importance of remaining in the ED until their evaluation is complete.  Lab work imaging ordered will need further work-up.   Marcello Fennel, PA-C 06/19/22 [phone]

## 2022-06-19 NOTE — ED Triage Notes (Signed)
Reports shortness of breath with deep inhalation , Dx pneumonia x 3 days .

## 2022-06-19 NOTE — ED Provider Notes (Signed)
I assumed care of patient from Dr. Melina Copa at 330.  Patient with pneumonia and this is now her third visit for shortness of breath, ongoing fever and nausea and vomiting.  Patient has received fluids and her lactate has improved however she remains tachycardic and low oxygen sats between 89 and 91% at rest.  Patient has received now 3 doses of IV antibiotics but is still complaining of nausea and does not feel that she can hold anything down.  Given this is her fifth time seeking care this week she would benefit from ongoing hydration and IV antibiotics and nausea control.   Blanchie Dessert, MD 06/19/22 1724

## 2022-06-19 NOTE — H&P (Signed)
History and Physical  Melissa [name] UUV:[id number] DOB: 1991/06/01 DOA: 06/19/2022  Referring physician: Accepted by Dr. Avon Gully, [facility], hospitalist service. PCP: Melissa Lukes, MD  Outpatient Specialists: None. Patient coming from: Home.  Chief Complaint: Cough and shortness of breath.  HPI: Melissa [name] is a 31 y.o. female with medical history significant for chronic anxiety/depression, adult ADHD, PCOS, obesity, GERD, who presented to [facility] ED due to worsening dyspnea with minimal exertion, associated with a productive cough of yellow mucus.  She was seen at urgent care 2 days prior to her presentation to the ED, and was diagnosed with left upper lobe pneumonia.  At that time, she received an injection of Rocephin 1 g x 1 IM, and an injection of Solu-Medrol 125 mg x 1 IM.  She was prescribed Z-Pak and albuterol neb by her PCP on the same day.  Due to persistent symptomatology and recurrent fevers, she presented to the ED for further evaluation.  Also endorses epigastric pain worse with coughing.  Denies use of NSAIDs.  Denies overt bleeding.  Work-up in the ED revealed left upper lobe infiltrates suggestive of pneumonia.  Elevated lactic acid 2.0, received IV fluid.  Not hypoxic.  She was started on Rocephin and azithromycin empirically, in the ED.  [facility], hospitalist service, was asked to admit.  ED Course: Tmax of 101.7.  BP 115/69, pulse 101, respiration rate 20, O2 saturation 91% on room air.  Lab studies significant.  Serum potassium 3.2, calcium 7.6, albumin 3.2.  Hemoglobin 10.6, MCV 79.7.  WBC 7.7.  Lactic acid 2.0, 0.5.  Review of Systems: Review of systems as noted in the HPI. All other systems reviewed and are negative.   Past Medical History:  Diagnosis Date   Abnormal Pap smear of cervix 07/11/2011   Acid reflux    Anxiety    Asthma    activity induced   Irregular menses    Polycystic ovarian syndrome    Past Surgical History:  Procedure Laterality Date   NO PAST  SURGERIES      Social History:  reports that she has never smoked. She has never used smokeless tobacco. She reports that she does not currently use alcohol after a past usage of about 4.0 - 7.0 standard drinks of alcohol per week. She reports that she does not currently use drugs after having used the following drugs: Marijuana.   Allergies  Allergen Reactions   Diphenhydramine Palpitations    Family History  Problem Relation Age of Onset   Gallbladder disease Mother    Colon polyps Mother    Hypertension Father    Other Father        Hepatitis A   Diabetes Father    Drug abuse Father    Post-traumatic stress disorder Father    Stomach cancer Maternal Grandmother    Diabetes Maternal Grandmother    Colon polyps Maternal Grandmother    Colon cancer Maternal Grandmother    Cancer Maternal Grandmother        stomach cancer   Alcohol abuse Maternal Grandmother    Emphysema Maternal Grandfather    Cancer Maternal Grandfather        smoke, lung cancer   Alcohol abuse Maternal Grandfather    Heart disease Paternal Grandfather    Alcohol abuse Paternal Grandfather    Breast cancer Other        mat great aunts x 2   Colon cancer Other        mat great uncles x 2  Irritable bowel syndrome Sister    Multiple sclerosis Sister    Bipolar disorder Sister    Colon polyps Paternal Uncle    Alcohol abuse Paternal Grandmother    Pancreatic cancer Neg Hx    Esophageal cancer Neg Hx       Prior to Admission medications   Medication Sig Start Date End Date Taking? Authorizing Provider  albuterol (PROVENTIL) (5 MG/ML) 0.5% nebulizer solution Take 0.5 mLs (2.5 mg total) by nebulization every 6 (six) hours as needed for wheezing or shortness of breath. 06/18/22   Dorie Rank, MD  albuterol (VENTOLIN HFA) 108 (90 Base) MCG/ACT inhaler Inhale 1-2 puffs into the lungs every 6 (six) hours as needed for wheezing or shortness of breath. 06/18/22   Dorie Rank, MD  amoxicillin-clavulanate  (AUGMENTIN) 875-125 MG tablet Take 1 tablet by mouth every 12 (twelve) hours. 06/18/22   Dorie Rank, MD  azithromycin (ZITHROMAX Z-PAK) 250 MG tablet As directed 06/17/22   Hassell Done, Mary-Margaret, FNP  benzonatate (TESSALON) 100 MG capsule Take 1 capsule (100 mg total) by mouth every 8 (eight) hours. 06/18/22   Dorie Rank, MD  busPIRone (BUSPAR) 15 MG tablet Take 1 tablet (15 mg total) by mouth 3 (three) times daily. 06/16/22   Melissa Lukes, MD  diazepam (VALIUM) 10 MG tablet Take 1 tablet (10 mg total) by mouth every 12 (twelve) hours as needed. for anxiety 06/16/22   Melissa Lukes, MD  doxycycline (VIBRA-TABS) 100 MG tablet Take 1 tablet (100 mg total) by mouth 2 (two) times daily. 06/07/22   Brunetta Jeans, PA-C  fluconazole (DIFLUCAN) 150 MG tablet Take 1 tablet PO once. Repeat in 3 days if needed. 06/07/22   Brunetta Jeans, PA-C  methylphenidate (CONCERTA) 36 MG PO CR tablet Take 1 tablet (36 mg total) by mouth daily. October 2023 06/16/22   Melissa Lukes, MD  methylphenidate (RITALIN) 10 MG tablet Take 1 tablet (10 mg total) by mouth daily as needed. 06/16/22   Melissa Lukes, MD  norgestimate-ethinyl estradiol (ORTHO-CYCLEN) 0.25-35 MG-MCG tablet Take 1 tablet by mouth daily. 03/19/22   Terrilyn Saver, NP  ondansetron (ZOFRAN) 4 MG tablet Take 1 tablet (4 mg total) by mouth every 8 (eight) hours as needed for nausea or vomiting. 06/17/22   Hassell Done, Mary-Margaret, FNP  ondansetron (ZOFRAN-ODT) 8 MG disintegrating tablet Take 1 tablet (8 mg total) by mouth every 8 (eight) hours as needed for nausea or vomiting. 06/18/22   Dorie Rank, MD  venlafaxine XR (EFFEXOR XR) 37.5 MG 24 hr capsule 1 cap po daily x 7 days then increase to 2 caps po daily x 7 days 05/20/22   Melissa Lukes, MD  venlafaxine XR (EFFEXOR-XR) 150 MG 24 hr capsule Take 1 capsule (150 mg total) by mouth daily with breakfast. 04/14/22   Melissa Lukes, MD    Physical Exam: BP 115/69 (BP Location: Left Arm)   Pulse (!)  101   Temp (!) 100.4 F (38 C) (Oral)   Resp 20   Ht '5\' 10"'$  (1.778 m)   Wt 97.1 kg   SpO2 91%   BMI 30.71 kg/m   General: 31 y.o. year-old female well developed well nourished in no acute distress.  Alert and oriented x3. Cardiovascular: Regular rate and rhythm with no rubs or gallops.  No thyromegaly or JVD noted.  No lower extremity edema. 2/4 pulses in all 4 extremities. Respiratory: Mild rales noted at bases.  Poor inspiratory effort. Abdomen: Soft, tender at  epigastric region.  Nondistended with normal bowel sounds x4 quadrants. Muskuloskeletal: No cyanosis, clubbing or edema noted bilaterally Neuro: CN II-XII intact, strength, sensation, reflexes Skin: No ulcerative lesions noted or rashes Psychiatry: Judgement and insight appear normal. Mood is appropriate for condition and setting          Labs on Admission:  Basic Metabolic Panel: Recent Labs  Lab 06/18/22 1122 06/19/22 0411 06/19/22 1512  NA 139 141 139  K 3.9 3.3* 3.2*  CL 102 105 108  CO2 '25 22 22  '$ GLUCOSE 138* 113* 85  BUN '18 19 16  '$ CREATININE 0.61 0.97 0.79  CALCIUM 9.3 9.1 7.6*   Liver Function Tests: Recent Labs  Lab 06/19/22 1512  AST 25  ALT 29  ALKPHOS 61  BILITOT 0.4  PROT 6.6  ALBUMIN 3.2*   No results for input(s): "LIPASE", "AMYLASE" in the last 168 hours. No results for input(s): "AMMONIA" in the last 168 hours. CBC: Recent Labs  Lab 06/18/22 1122 06/19/22 0411 06/19/22 1400 06/19/22 1620  WBC 9.0 11.6* 9.0 8.7  NEUTROABS  --  7.8* PENDING 5.7  HGB 12.6 12.3 14.0 10.2*  HCT 39.1 38.4 44.5 31.8*  MCV 78.4* 79.7* 79.2* 78.1*  PLT 365 380 365 341   Cardiac Enzymes: No results for input(s): "CKTOTAL", "CKMB", "CKMBINDEX", "TROPONINI" in the last 168 hours.  BNP (last 3 results) No results for input(s): "BNP" in the last 8760 hours.  ProBNP (last 3 results) No results for input(s): "PROBNP" in the last 8760 hours.  CBG: No results for input(s): "GLUCAP" in the last 168  hours.  Radiological Exams on Admission: DG Chest 2 View  Result Date: 06/19/2022 CLINICAL DATA:  Shortness of breath with deep inhalation. History of pneumonia for 3 days. EXAM: CHEST - 2 VIEW COMPARISON:  Chest two views 06/18/2022, 06/17/2022, CT chest 10/16/2021 FINDINGS: Cardiac silhouette and mediastinal contours are within normal limits. There is mildly improved aeration of the left mid lung with mildly decreased density and dispersion of the previously seen heterogeneous airspace opacity. Airspace opacity does persist. IMPRESSION: Mild improvement in persistent left midlung airspace opacity. Electronically Signed   By: Yvonne Kendall M.D.   On: 06/19/2022 13:33   DG Chest 2 View  Result Date: 06/18/2022 CLINICAL DATA:  Several day history of productive cough with reported fevers EXAM: CHEST - 2 VIEW COMPARISON:  Chest radiograph dated 06/17/2022 FINDINGS: Normal lung volumes. Similar left lower lobe hazy opacity. No pleural effusion or pneumothorax. Similar cardiomediastinal silhouette. The visualized skeletal structures are unremarkable. IMPRESSION: Similar left lower lobe hazy opacity, suspicious for pneumonia. Electronically Signed   By: Darrin Nipper M.D.   On: 06/18/2022 10:36    EKG: I independently viewed the EKG done and my findings are as followed: Sinus tachycardia rate of 109.  Nonspecific ST-T changes.  QTc 409.  Assessment/Plan Present on Admission:  Sepsis due to pneumonia [facility])  Principal Problem:   Sepsis due to pneumonia ([place])  Sepsis due to community-acquired pneumonia, POA. Started on Z-Pak outpatient 2 days ago. Obtain sputum culture Obtain blood cultures x2 peripherally. Lactic acid improved after IV fluid, 0.5, from 2. Obtain baseline procalcitonin in the morning Continue Rocephin and azithromycin started in the ED DuoNeb every 6 hours. Antitussives. Monitor fever curve and WBCs. Maintain MAP greater than 65.  Epigastric pain, unclear etiology Trial of p.o.  Protonix 40 mg daily x5 days. IV antiemetics as needed Denies use of NSAIDs or overt bleeding.  Chronic anxiety/depression Resume home regimen.  Obesity BMI 30 Recommend weight loss outpatient with regular physical activity and healthy dieting.     DVT prophylaxis: Subcu Lovenox daily.  Code Status: Full code.  Family Communication: None at bedside.  Disposition Plan: Admitted to MedSurg unit by Dr. Avon Gully, [facility], hospitalist service.  Consults called: None.  Admission status: Inpatient status.   Status is: Inpatient The patient requires at least 2 midnights for further evaluation and treatment of present condition.   Kayleen Memos MD [facility] Pager [phone]  If 7PM-7AM, please contact night-coverage [email or web address] Password [email or web address]  06/19/2022, 9:50 PM

## 2022-06-19 NOTE — ED Notes (Signed)
Telephone report to Solomon at this time

## 2022-06-19 NOTE — ED Notes (Signed)
Obtained another blue x2, lav and lt green vials for lab

## 2022-06-20 ENCOUNTER — Inpatient Hospital Stay (HOSPITAL_COMMUNITY): Payer: PRIVATE HEALTH INSURANCE

## 2022-06-20 DIAGNOSIS — F909 Attention-deficit hyperactivity disorder, unspecified type: Secondary | ICD-10-CM

## 2022-06-20 DIAGNOSIS — E876 Hypokalemia: Secondary | ICD-10-CM

## 2022-06-20 DIAGNOSIS — F320 Major depressive disorder, single episode, mild: Secondary | ICD-10-CM

## 2022-06-20 DIAGNOSIS — R1013 Epigastric pain: Secondary | ICD-10-CM

## 2022-06-20 DIAGNOSIS — K219 Gastro-esophageal reflux disease without esophagitis: Secondary | ICD-10-CM

## 2022-06-20 DIAGNOSIS — F32A Depression, unspecified: Secondary | ICD-10-CM

## 2022-06-20 DIAGNOSIS — F419 Anxiety disorder, unspecified: Secondary | ICD-10-CM

## 2022-06-20 LAB — BASIC METABOLIC PANEL
Anion gap: 8 (ref 5–15)
BUN: 10 mg/dL (ref 6–20)
CO2: 26 mmol/L (ref 22–32)
Calcium: 8.1 mg/dL — ABNORMAL LOW (ref 8.9–10.3)
Chloride: 107 mmol/L (ref 98–111)
Creatinine, Ser: 0.62 mg/dL (ref 0.44–1.00)
GFR, Estimated: >60 mL/min (ref >60)
Glucose, Bld: 77 mg/dL (ref 70–99)
Potassium: 3.4 mmol/L — ABNORMAL LOW (ref 3.5–5.1)
Sodium: 141 mmol/L (ref 135–145)

## 2022-06-20 LAB — RAPID URINE DRUG SCREEN, HOSP PERFORMED
Amphetamines: NOT DETECTED
Barbiturates: NOT DETECTED
Benzodiazepines: POSITIVE — AB
Cocaine: NOT DETECTED
Opiates: NOT DETECTED
Tetrahydrocannabinol: POSITIVE — AB

## 2022-06-20 LAB — EXPECTORATED SPUTUM ASSESSMENT W GRAM STAIN, RFLX TO RESP C: Special Requests: NORMAL

## 2022-06-20 LAB — CBC WITH DIFFERENTIAL/PLATELET
Abs Immature Granulocytes: 0.02 10*3/uL (ref 0.00–0.07)
Basophils Absolute: 0 10*3/uL (ref 0.0–0.1)
Basophils Relative: 1 %
Eosinophils Absolute: 0.1 10*3/uL (ref 0.0–0.5)
Eosinophils Relative: 1 %
HCT: 34.5 % — ABNORMAL LOW (ref 36.0–46.0)
Hemoglobin: 10.7 g/dL — ABNORMAL LOW (ref 12.0–15.0)
Immature Granulocytes: 0 %
Lymphocytes Relative: 37 %
Lymphs Abs: 2.3 10*3/uL (ref 0.7–4.0)
MCH: 25.1 pg — ABNORMAL LOW (ref 26.0–34.0)
MCHC: 31 g/dL (ref 30.0–36.0)
MCV: 80.8 fL (ref 80.0–100.0)
Monocytes Absolute: 0.8 10*3/uL (ref 0.1–1.0)
Monocytes Relative: 12 %
Neutro Abs: 3 10*3/uL (ref 1.7–7.7)
Neutrophils Relative %: 49 %
Platelets: 326 10*3/uL (ref 150–400)
RBC: 4.27 MIL/uL (ref 3.87–5.11)
RDW: 15.3 % (ref 11.5–15.5)
WBC: 6.2 10*3/uL (ref 4.0–10.5)
nRBC: 0 % (ref 0.0–0.2)

## 2022-06-20 LAB — URINE CULTURE: Culture: 20000 — AB

## 2022-06-20 LAB — PHOSPHORUS: Phosphorus: 3.6 mg/dL (ref 2.5–4.6)

## 2022-06-20 LAB — HIV ANTIBODY (ROUTINE TESTING W REFLEX): HIV Screen 4th Generation wRfx: NONREACTIVE

## 2022-06-20 LAB — MAGNESIUM: Magnesium: 2.3 mg/dL (ref 1.7–2.4)

## 2022-06-20 LAB — LIPASE, BLOOD: Lipase: 30 U/L (ref 11–51)

## 2022-06-20 LAB — PROCALCITONIN: Procalcitonin: <0.1 ng/mL

## 2022-06-20 MED ORDER — ALUM & MAG HYDROXIDE-SIMETH 200-200-20 MG/5ML PO SUSP
30.0000 mL | Freq: Once | ORAL | Status: AC
  Administered 2022-06-20: 30 mL via ORAL
  Filled 2022-06-20: qty 30

## 2022-06-20 MED ORDER — ALUM & MAG HYDROXIDE-SIMETH 200-200-20 MG/5ML PO SUSP: 30.0000 mL | Freq: Four times a day (QID) | ORAL | Status: DC | PRN

## 2022-06-20 MED ORDER — FLUCONAZOLE 100 MG PO TABS
100.0000 mg | ORAL_TABLET | Freq: Once | ORAL | Status: AC
  Administered 2022-06-20: 100 mg via ORAL
  Filled 2022-06-20: qty 1

## 2022-06-20 MED ORDER — DICYCLOMINE HCL 10 MG/5ML PO SOLN
10.0000 mg | Freq: Once | ORAL | Status: AC
  Administered 2022-06-20: 10 mg via ORAL
  Filled 2022-06-20: qty 5

## 2022-06-20 MED ORDER — ONDANSETRON 4 MG PO TBDP: 8.0000 mg | ORAL_TABLET | Freq: Three times a day (TID) | ORAL | Status: DC | PRN

## 2022-06-20 MED ORDER — METHYLPHENIDATE HCL 20 MG PO TABS: 20.0000 mg | ORAL_TABLET | Freq: Three times a day (TID) | ORAL | Status: DC

## 2022-06-20 NOTE — Hospital Course (Addendum)
Melissa [name] is a 31 y.o. female with medical history significant for chronic anxiety/depression,  ADHD, PCOS, obesity, GERD, presented to hospital with dyspnea a on minimal exertion, with cough and yellowish mucus production.  Of note patient was recently seen at urgent care center and diagnosed of having left upper lobe pneumonia and was given oral antibiotic and discharged with plans for follow-up with PCP..  Due to persistent symptoms and recurrent fever patient now presented to the hospital.  In the ED, patient was febrile with a temperature of 1 1.7 F was mildly tachycardic and tachypneic.  Pulse ox was 91% on room air.  Labs showed hypokalemia with potassium of 3.2.  WBC was 7.7.  Lactic acid of 2.0.  Patient had left upper lobe infiltrates.  Patient was started on Rocephin and Zithromax and was admitted hospital for further evaluation and treatment.

## 2022-06-20 NOTE — Progress Notes (Signed)
Mobility Specialist - Progress Note   06/20/22 0939  Mobility  Activity Ambulated independently in hallway  Level of Assistance Modified independent, requires aide device or extra time  Assistive Device None  Distance Ambulated (ft) 500 ft  Activity Response Tolerated well  Mobility Referral Yes  $Mobility charge 1 Mobility   Pt received in bed and agreed for mobility, no c/o pain nor discomfort during ambulation just fatigue. Pt back to chair with all needs met.  Roderick Pee Mobility Specialist

## 2022-06-20 NOTE — Progress Notes (Signed)
PROGRESS NOTE    Twala Collings  URK:[id number] DOB: 05-03-1991 DOA: 06/19/2022 PCP: [name], MD    Brief Narrative:  Melissa [name] is a 31 y.o. female with medical history significant for chronic anxiety/depression,  ADHD, PCOS, obesity, GERD, presented to hospital with dyspnea a on minimal exertion, with cough and yellowish mucus production.  Of note patient was recently seen at urgent care center and diagnosed of having left upper lobe pneumonia and was given oral antibiotic and discharged with plans for follow-up with PCP..  Due to persistent symptoms and recurrent fever patient now presented to the hospital.  In the ED, patient was febrile with a temperature of 1 1.7 F was mildly tachycardic and tachypneic.  Pulse ox was 91% on room air.  Labs showed hypokalemia with potassium of 3.2.  WBC was 7.7.  Lactic acid of 2.0.  Patient had left upper lobe infiltrates.  Patient was started on Rocephin and Zithromax and was admitted hospital for further evaluation and treatment.    Assessment and plan.  Principal Problem:   Sepsis due to pneumonia [facility]) Active Problems:   Epigastric pain   Depression   GERD (gastroesophageal reflux disease)   Morbid obesity ([place])   Anxiety and depression   Adult ADHD   Hypokalemia   Sepsis due to community-acquired pneumonia Failed outpatient treatment.  Patient had tachypnea, tachycardia fever with elevated lactate and chest x-ray with infiltrate suggestive of sepsis.    Continue IV Rocephin and Zithromax.  Continue DuoNebs supportive care antitussives.  Sputum culture pending.  Influenza and COVID test was negative.  Lactate has normalized after hydration.  HIV pending.  Blood cultures pending.  Temperature max was 101.7 F.   Hypokalemia.  Mild.  On Ringer lactate with potassium.  States that that she could not tolerate oral antibiotic and potassium.  ADHD.  Continue Ritalin.  Marijuana use urine drug screen positive for benzodiazepines and  tetrahydrocannabinol.  Pregnancy test was negative.  Epigastric pain, unclear etiology Continue Protonix for now,  antiemetics.  Patient denied use of NSAIDs.  Pregnancy test was negative.  Urinalysis was negative for infection.  We will continue PPI.  Check right upper quadrant ultrasound.  Check LFTs.  Lipase of 30. Chronic anxiety/depression On Valium, Effexor and buspirone at this time.   Obesity BMI of 30.  Will benefit from weight loss as outpatient.    DVT prophylaxis: enoxaparin (LOVENOX) injection 40 mg Start: 06/19/22 2200   Code Status:     Code Status: Full Code  Disposition: Home likely 06/21/2022  Status is: Inpatient  Remains inpatient appropriate because: IV antibiotic, sepsis secondary to pneumonia   Family Communication: Spoke with the patient's father on the phone.  Consultants:  None  Procedures:  None  Antimicrobials:  Rocephin Zithromax  Anti-infectives (From admission, onward)    Start     Dose/Rate Route Frequency Ordered Stop   06/20/22 1500  azithromycin (ZITHROMAX) 500 mg in sodium chloride 0.9 % 250 mL IVPB        500 mg 250 mL/hr over 60 Minutes Intravenous Every 24 hours 06/19/22 2105     06/20/22 1400  cefTRIAXone (ROCEPHIN) 2 g in sodium chloride 0.9 % 100 mL IVPB        2 g 200 mL/hr over 30 Minutes Intravenous Every 24 hours 06/19/22 2105     06/20/22 1000  fluconazole (DIFLUCAN) tablet 100 mg       Note to Pharmacy: Take 1 tablet PO once. Repeat in 3 days  if needed.     100 mg Oral  Once 06/20/22 0743 06/20/22 1011   06/19/22 1330  cefTRIAXone (ROCEPHIN) 1 g in sodium chloride 0.9 % 100 mL IVPB        1 g 200 mL/hr over 30 Minutes Intravenous  Once 06/19/22 1320 06/19/22 1603   06/19/22 1330  azithromycin (ZITHROMAX) 500 mg in sodium chloride 0.9 % 250 mL IVPB        500 mg 250 mL/hr over 60 Minutes Intravenous  Once 06/19/22 1320 06/19/22 1637       Subjective: Today, patient was seen and examined at bedside.  Complains of  cough congestion, nausea, epigastric/chest discomfort, difficulty with p.o. medications due to nausea.  Has been having some wheezing but was not using inhalers at home.  Complains of fatigue and weakness.    Objective: Vitals:   06/19/22 2215 06/19/22 2330 06/20/22 0427 06/20/22 0852  BP:   119/80 112/64  Pulse:   86 92  Resp:   18 18  Temp:  98.2 F (36.8 C) 98.6 F (37 C) 98.6 F (37 C)  TempSrc:  Oral Oral Oral  SpO2: 91%  90% 93%  Weight:      Height:        Intake/Output Summary (Last 24 hours) at 06/20/2022 1131 Last data filed at 06/19/2022 1637 Gross per 24 hour  Intake 1300.75 ml  Output --  Net 1300.75 ml   Filed Weights   06/19/22 1248  Weight: 97.1 kg    Physical Examination: Body mass index is 30.71 kg/m.   General: Obese built, not in obvious distress HENT:   No scleral pallor or icterus noted. Oral mucosa is moist.  Chest:   Diminished breath sounds bilaterally. No crackles or wheezes.  CVS: S1 &S2 heard. No murmur.  Regular rate and rhythm. Abdomen: Soft, nontender, nondistended.  Bowel sounds are heard.   Extremities: No cyanosis, clubbing or edema.  Peripheral pulses are palpable. Psych: Alert, awake and oriented, normal mood CNS:  No cranial nerve deficits.  Power equal in all extremities.   Skin: Warm and dry.  No rashes noted.  Data Reviewed:   CBC: Recent Labs  Lab 06/19/22 0411 06/19/22 1400 06/19/22 1620 06/19/22 2122 06/20/22 0556  WBC 11.6* 9.0 8.7 7.7 6.2  NEUTROABS 7.8* PENDING 5.7  --  3.0  HGB 12.3 14.0 10.2* 10.6* 10.7*  HCT 38.4 44.5 31.8* 33.8* 34.5*  MCV 79.7* 79.2* 78.1* 79.7* 80.8  PLT 380 365 341 345 818    Basic Metabolic Panel: Recent Labs  Lab 06/18/22 1122 06/19/22 0411 06/19/22 1512 06/19/22 2122 06/20/22 0556  NA 139 141 139  --  141  K 3.9 3.3* 3.2*  --  3.4*  CL 102 105 108  --  107  CO2 '25 22 22  '$ --  26  GLUCOSE 138* 113* 85  --  77  BUN '18 19 16  '$ --  10  CREATININE 0.61 0.97 0.79 0.74 0.62   CALCIUM 9.3 9.1 7.6*  --  8.1*  MG  --   --   --   --  2.3  PHOS  --   --   --   --  3.6    Liver Function Tests: Recent Labs  Lab 06/19/22 1512  AST 25  ALT 29  ALKPHOS 61  BILITOT 0.4  PROT 6.6  ALBUMIN 3.2*     Radiology Studies: DG Chest 2 View  Result Date: 06/19/2022 CLINICAL DATA:  Shortness of breath  with deep inhalation. History of pneumonia for 3 days. EXAM: CHEST - 2 VIEW COMPARISON:  Chest two views 06/18/2022, 06/17/2022, CT chest 10/16/2021 FINDINGS: Cardiac silhouette and mediastinal contours are within normal limits. There is mildly improved aeration of the left mid lung with mildly decreased density and dispersion of the previously seen heterogeneous airspace opacity. Airspace opacity does persist. IMPRESSION: Mild improvement in persistent left midlung airspace opacity. Electronically Signed   By: Yvonne Kendall M.D.   On: 06/19/2022 13:33      LOS: 1 day    Flora Lipps, MD [facility] Available via Epic secure chat 7am-7pm After these hours, please refer to coverage provider listed on [email or web address] 06/20/2022, 11:31 AM

## 2022-06-20 NOTE — Progress Notes (Signed)
Nutrition Brief Note  Patient identified on the Malnutrition Screening Tool (MST) Report  Patient reports she has been attempting to lose weight. Has lost 15 lbs. Has been intermittently fasting and consuming 1 meal day. Advised pt to try and eat more consistently for better blood sugar control and to consume adequate amounts of protein. Pt with many food dislikes and preferences. Does not like protein shakes but RD recommended pt try [employer] shakes as they are high in protein and have a better taste.  Answered all of pt's diet related questions.  Wt Readings from Last 15 Encounters:  06/19/22 97.1 kg  06/18/22 97.1 kg  04/14/22 96.2 kg  10/24/21 103 kg  10/16/21 103 kg  06/17/21 102.3 kg  04/29/21 104.9 kg  01/07/21 105.4 kg  12/16/19 103 kg  11/14/19 109.7 kg  10/25/19 108 kg  09/27/19 99.8 kg  09/25/19 89.6 kg  02/09/17 89.6 kg  07/23/16 94.5 kg    Body mass index is 30.71 kg/m. Patient meets criteria for obesity based on current BMI.   Current diet order is regular.  Labs and medications reviewed.   No nutrition interventions warranted at this time. If nutrition issues arise, please consult RD.   Clayton Bibles, MS, RD, LDN Inpatient Clinical Dietitian Contact information available via Amion

## 2022-06-20 NOTE — TOC Progression Note (Signed)
Transition of Care [facility]) - Progression Note    Patient Details  Name: Chapel Silverthorn MRN: [id number] Date of Birth: 01-Oct-1990  Transition of Care [facility]) CM/SW Contact  Servando Snare, [place] Phone Number: 06/20/2022, 8:49 AM  Clinical Narrative:      Transition of Care (TOC) Screening Note   Patient Details  Name: Medora Roorda Date of Birth: 06-19-1991   Transition of Care [facility]) CM/SW Contact:    Servando Snare, LCSW Phone Number: 06/20/2022, 8:49 AM    Transition of Care Department [facility]) has reviewed patient and no TOC needs have been identified at this time. We will continue to monitor patient advancement through interdisciplinary progression rounds. If new patient transition needs arise, please place a TOC consult.         Expected Discharge Plan and Services                                                 Social Determinants of Health (SDOH) Interventions    Readmission Risk Interventions     No data to display

## 2022-06-21 ENCOUNTER — Encounter (HOSPITAL_COMMUNITY): Payer: Self-pay | Admitting: Internal Medicine

## 2022-06-21 LAB — CBC
HCT: 33 % — ABNORMAL LOW (ref 36.0–46.0)
Hemoglobin: 10.2 g/dL — ABNORMAL LOW (ref 12.0–15.0)
MCH: 25 pg — ABNORMAL LOW (ref 26.0–34.0)
MCHC: 30.9 g/dL (ref 30.0–36.0)
MCV: 80.9 fL (ref 80.0–100.0)
Platelets: 332 10*3/uL (ref 150–400)
RBC: 4.08 MIL/uL (ref 3.87–5.11)
RDW: 15 % (ref 11.5–15.5)
WBC: 4.5 10*3/uL (ref 4.0–10.5)
nRBC: 0 % (ref 0.0–0.2)

## 2022-06-21 LAB — COMPREHENSIVE METABOLIC PANEL
ALT: 26 U/L (ref 0–44)
AST: 16 U/L (ref 15–41)
Albumin: 3 g/dL — ABNORMAL LOW (ref 3.5–5.0)
Alkaline Phosphatase: 54 U/L (ref 38–126)
Anion gap: 6 (ref 5–15)
BUN: 7 mg/dL (ref 6–20)
CO2: 27 mmol/L (ref 22–32)
Calcium: 8.1 mg/dL — ABNORMAL LOW (ref 8.9–10.3)
Chloride: 109 mmol/L (ref 98–111)
Creatinine, Ser: 0.54 mg/dL (ref 0.44–1.00)
GFR, Estimated: >60 mL/min (ref >60)
Glucose, Bld: 91 mg/dL (ref 70–99)
Potassium: 3.7 mmol/L (ref 3.5–5.1)
Sodium: 142 mmol/L (ref 135–145)
Total Bilirubin: 0.4 mg/dL (ref 0.3–1.2)
Total Protein: 6.7 g/dL (ref 6.5–8.1)

## 2022-06-21 LAB — MAGNESIUM: Magnesium: 2.6 mg/dL — ABNORMAL HIGH (ref 1.7–2.4)

## 2022-06-21 MED ORDER — MELATONIN 3 MG PO TABS
3.0000 mg | ORAL_TABLET | Freq: Once | ORAL | Status: AC
  Administered 2022-06-21: 3 mg via ORAL
  Filled 2022-06-21: qty 1

## 2022-06-21 MED ORDER — SODIUM CHLORIDE 0.9 % IV SOLN
500.0000 mg | INTRAVENOUS | Status: DC
  Administered 2022-06-21: 500 mg via INTRAVENOUS
  Filled 2022-06-21: qty 5

## 2022-06-21 MED ORDER — LEVOFLOXACIN 750 MG PO TABS: 750.0000 mg | ORAL_TABLET | Freq: Every day | ORAL | 0 refills | Status: AC

## 2022-06-21 NOTE — Discharge Summary (Addendum)
Physician Discharge Summary   Patient: Melissa [name] MRN: [id number] DOB: 1991/08/18  Admit date:     06/19/2022  Discharge date: 06/21/22  Discharge Physician: Corrie Mckusick Nahara Dona   PCP: [name], MD   Recommendations at discharge:   Follow-up with your primary care physician in 1 week.  Recommend chest x-ray in 4 to 6 weeks to ensure resolution of pneumonia. Complete the course of antibiotic.   Check CBC, BMP, magnesium LFT in the next visit.  Discharge Diagnoses: Active Problems:   Depression   GERD (gastroesophageal reflux disease)   Morbid obesity (HCC)   Anxiety and depression   Adult ADHD  Principal Problem (Resolved):   Sepsis due to pneumonia [facility]) Resolved Problems:   Epigastric pain   Hypokalemia  Hospital Course: Melissa [name] is a 31 y.o. female with medical history significant for chronic anxiety/depression,  ADHD, PCOS, obesity, GERD, presented to hospital with dyspnea a on minimal exertion, with cough and yellowish mucus production.  Of note patient was recently seen at urgent care center and diagnosed of having left upper lobe pneumonia and was given oral antibiotic and discharged with plans for follow-up with PCP.  Due to persistent symptoms and recurrent fever, patient now presented to the hospital.  In the ED, patient was febrile with a temperature of 1 1.7 F was mildly tachycardic and tachypneic.  Pulse ox was 91% on room air.  Labs showed hypokalemia with potassium of 3.2.  WBC was 7.7.  Lactic acid of 2.0.  Patient had left upper lobe infiltrates.  Patient was started on Rocephin and Zithromax and was admitted hospital for further evaluation and treatment.    Following conditions were addressed during hospitalization,  Sepsis due to community-acquired pneumonia Failed outpatient treatment with antibiotics.  Patient had tachypnea, tachycardia fever with elevated lactate and chest x-ray with infiltrate suggestive of sepsis. Patient received IV Rocephin and  Zithromax, DuoNebs, antitussives and supportive care during hospitalization..  Influenza and COVID test was negative.  Lactate has normalized after hydration.  HIV pending.  Blood cultures negative in 2 days.  Temperature max was 98.6 F.  Patient does not wish to swallow oral pills if possible but would need antibiotics, will consider Levaquin tablet for ease of taking once a day to complete the course of antibiotic.Marland Kitchen   Hypokalemia.  Improved after replacement with IV fluids.  Potassium prior to discharge was 3.7   ADHD.  on Ritalin at home..   Marijuana use urine drug screen positive for benzodiazepines and tetrahydrocannabinol.  Pregnancy test was negative.   Epigastric pain, unclear etiology Pregnancy test was negative.  Urinalysis was negative for infection.  Right upper quadrant ultrasound was negative for acute findings.Marland Kitchen  LFTs were within normal limits.  Lipase of 30.  She believes gastric pain after vomiting.  Advised against marijuana.Continue ondansetron for nausea..    Chronic anxiety/depression On Valium, Effexor and buspirone.  Will resume on discharge   Obesity BMI of 30.  Will benefit from weight loss as outpatient.   Consultants: None Procedures performed: None Disposition: Home Diet recommendation:  Discharge Diet Orders (From admission, onward)     Start     Ordered   06/21/22 0000  Diet general        06/21/22 0937           DISCHARGE MEDICATION: Allergies as of 06/21/2022   No Known Allergies      Medication List     STOP taking these medications    amoxicillin-clavulanate 875-125 MG  tablet Commonly known as: AUGMENTIN   azithromycin 250 MG tablet Commonly known as: Zithromax Z-Pak   doxycycline 100 MG tablet Commonly known as: VIBRA-TABS   fluconazole 150 MG tablet Commonly known as: DIFLUCAN   ondansetron 4 MG tablet Commonly known as: Zofran       TAKE these medications    albuterol 108 (90 Base) MCG/ACT inhaler Commonly known  as: VENTOLIN HFA Inhale 1-2 puffs into the lungs every 6 (six) hours as needed for wheezing or shortness of breath. What changed: how much to take   albuterol (5 MG/ML) 0.5% nebulizer solution Commonly known as: PROVENTIL Take 0.5 mLs (2.5 mg total) by nebulization every 6 (six) hours as needed for wheezing or shortness of breath. What changed: Another medication with the same name was changed. Make sure you understand how and when to take each.   ALEVE SINUS & HEADACHE PO Take 1-2 tablets by mouth 2 (two) times daily as needed (for congestion).   benzonatate 100 MG capsule Commonly known as: TESSALON Take 1 capsule (100 mg total) by mouth every 8 (eight) hours.   busPIRone 15 MG tablet Commonly known as: BUSPAR Take 1 tablet (15 mg total) by mouth 3 (three) times daily.   diazepam 10 MG tablet Commonly known as: VALIUM Take 1 tablet (10 mg total) by mouth every 12 (twelve) hours as needed. for anxiety What changed:  reasons to take this additional instructions   levofloxacin 750 MG tablet Commonly known as: Levaquin Take 1 tablet (750 mg total) by mouth daily for 3 days.   methylphenidate 20 MG tablet Commonly known as: RITALIN Take 20 mg by mouth 3 (three) times daily with meals. What changed: Another medication with the same name was removed. Continue taking this medication, and follow the directions you see here.   norgestimate-ethinyl estradiol 0.25-35 MG-MCG tablet Commonly known as: ORTHO-CYCLEN Take 1 tablet by mouth daily.   ondansetron 8 MG disintegrating tablet Commonly known as: ZOFRAN-ODT Take 1 tablet (8 mg total) by mouth every 8 (eight) hours as needed for nausea or vomiting.   venlafaxine XR 150 MG 24 hr capsule Commonly known as: EFFEXOR-XR Take 1 capsule (150 mg total) by mouth daily with breakfast.   venlafaxine XR 37.5 MG 24 hr capsule Commonly known as: Effexor XR 1 cap po daily x 7 days then increase to 2 caps po daily x 7 days          Subjective Today, patient was seen and examined at bedside.  Feels better with breathing.  Has been able to ambulate without oxygen.  Has mild cough.  Discharge Exam: Filed Weights   06/19/22 1248  Weight: 97.1 kg      06/21/2022   12:01 PM 06/21/2022    3:41 AM 06/20/2022    7:34 PM  Vitals with BMI  Systolic 841 660 630  Diastolic 79 74 75  Pulse 96 74 98    General: Obese built, not in obvious distress HENT:   No scleral pallor or icterus noted. Oral mucosa is moist.  Chest: .  Diminished breath sounds bilaterally.  CVS: S1 &S2 heard. No murmur.  Regular rate and rhythm. Abdomen: Soft, nontender, nondistended.  Bowel sounds are heard.   Extremities: No cyanosis, clubbing or edema.  Peripheral pulses are palpable. Psych: Alert, awake and oriented, normal mood CNS:  No cranial nerve deficits.  Power equal in all extremities.   Skin: Warm and dry.  No rashes noted.  Condition at discharge: good  The  results of significant diagnostics from this hospitalization (including imaging, microbiology, ancillary and laboratory) are listed below for reference.   Imaging Studies: US ABDOMEN LIMITED RUQ (LIVER/GB)  Result Date: 06/20/2022 CLINICAL DATA:  Abdominal pain.  Right upper quadrant pain. EXAM: ULTRASOUND ABDOMEN LIMITED RIGHT UPPER QUADRANT COMPARISON:  None Available. FINDINGS: Gallbladder: No gallstones or wall thickening visualized. No sonographic Langill sign noted by sonographer. Common bile duct: Diameter: 2.4 mm, within normal limits Liver: No focal lesion identified. Within normal limits in parenchymal echogenicity. Portal vein is patent on color Doppler imaging with normal direction of blood flow towards the liver. Other: None. IMPRESSION: Negative right upper quadrant ultrasound. Electronically Signed   By: San Morelle M.D.   On: 06/20/2022 15:46   DG Chest 2 View  Result Date: 06/19/2022 CLINICAL DATA:  Shortness of breath with deep inhalation. History of  pneumonia for 3 days. EXAM: CHEST - 2 VIEW COMPARISON:  Chest two views 06/18/2022, 06/17/2022, CT chest 10/16/2021 FINDINGS: Cardiac silhouette and mediastinal contours are within normal limits. There is mildly improved aeration of the left mid lung with mildly decreased density and dispersion of the previously seen heterogeneous airspace opacity. Airspace opacity does persist. IMPRESSION: Mild improvement in persistent left midlung airspace opacity. Electronically Signed   By: Yvonne Kendall M.D.   On: 06/19/2022 13:33   DG Chest 2 View  Result Date: 06/18/2022 CLINICAL DATA:  Several day history of productive cough with reported fevers EXAM: CHEST - 2 VIEW COMPARISON:  Chest radiograph dated 06/17/2022 FINDINGS: Normal lung volumes. Similar left lower lobe hazy opacity. No pleural effusion or pneumothorax. Similar cardiomediastinal silhouette. The visualized skeletal structures are unremarkable. IMPRESSION: Similar left lower lobe hazy opacity, suspicious for pneumonia. Electronically Signed   By: Darrin Nipper M.D.   On: 06/18/2022 10:36    Microbiology: Results for orders placed or performed during the hospital encounter of 06/19/22  Resp Panel by RT-PCR (Flu A&B, Covid) Urine, Clean Catch     Status: None   Collection Time: 06/19/22  2:13 PM   Specimen: Urine, Clean Catch; Nasal Swab  Result Value Ref Range Status   SARS Coronavirus 2 by RT PCR NEGATIVE NEGATIVE Final    Comment: (NOTE) SARS-CoV-2 target nucleic acids are NOT DETECTED.  The SARS-CoV-2 RNA is generally detectable in upper respiratory specimens during the acute phase of infection. The lowest concentration of SARS-CoV-2 viral copies this assay can detect is 138 copies/mL. A negative result does not preclude SARS-Cov-2 infection and should not be used as the sole basis for treatment or other patient management decisions. A negative result may occur with  improper specimen collection/handling, submission of specimen other than  nasopharyngeal swab, presence of viral mutation(s) within the areas targeted by this assay, and inadequate number of viral copies(<138 copies/mL). A negative result must be combined with clinical observations, patient history, and epidemiological information. The expected result is Negative.  Fact Sheet for Patients:  EntrepreneurPulse.com.au  Fact Sheet for Healthcare Providers:  IncredibleEmployment.be  This test is no t yet approved or cleared by the [place] FDA and  has been authorized for detection and/or diagnosis of SARS-CoV-2 by FDA under an Emergency Use Authorization (EUA). This EUA will remain  in effect (meaning this test can be used) for the duration of the COVID-19 declaration under Section 564(b)(1) of the Act, 21 U.S.C.section 360bbb-3(b)(1), unless the authorization is terminated  or revoked sooner.       Influenza A by PCR NEGATIVE NEGATIVE Final   Influenza B  by PCR NEGATIVE NEGATIVE Final    Comment: (NOTE) The Xpert Xpress SARS-CoV-2/FLU/RSV plus assay is intended as an aid in the diagnosis of influenza from Nasopharyngeal swab specimens and should not be used as a sole basis for treatment. Nasal washings and aspirates are unacceptable for Xpert Xpress SARS-CoV-2/FLU/RSV testing.  Fact Sheet for Patients: EntrepreneurPulse.com.au  Fact Sheet for Healthcare Providers: IncredibleEmployment.be  This test is not yet approved or cleared by the [place] FDA and has been authorized for detection and/or diagnosis of SARS-CoV-2 by FDA under an Emergency Use Authorization (EUA). This EUA will remain in effect (meaning this test can be used) for the duration of the COVID-19 declaration under Section 564(b)(1) of the Act, 21 U.S.C. section 360bbb-3(b)(1), unless the authorization is terminated or revoked.  Performed at [facility], [address], [place], [place]  [address]   Urine Culture     Status: Abnormal   Collection Time: 06/19/22  2:14 PM   Specimen: Urine, Clean Catch  Result Value Ref Range Status   Specimen Description   Final    URINE, CLEAN CATCH Performed at [facility], [address] [place], [place] [address]    Special Requests   Final    NONE Performed at [facility], [address] [place], [place] [address]    Culture 20,000 COLONIES/mL YEAST (A)  Final   Report Status 06/20/2022 FINAL  Final  Culture, blood (Routine X 2) w Reflex to ID Panel     Status: None (Preliminary result)   Collection Time: 06/19/22 10:22 PM   Specimen: BLOOD  Result Value Ref Range Status   Specimen Description   Final    BLOOD LEFT ANTECUBITAL Performed at [facility], [place] [address] [place], [place] [address]    Special Requests   Final    BOTTLES DRAWN AEROBIC AND ANAEROBIC Blood Culture adequate volume Performed at [place] [address] [place], [place] [address]    Culture   Final    NO GROWTH 2 DAYS Performed at [place] [address], [place], [place] [address]    Report Status PENDING  Incomplete  Culture, blood (Routine X 2) w Reflex to ID Panel     Status: None (Preliminary result)   Collection Time: 06/19/22 10:24 PM   Specimen: BLOOD  Result Value Ref Range Status   Specimen Description   Final    BLOOD BLOOD LEFT HAND Performed at [place] [address], [place], [place] [address]    Special Requests   Final    BOTTLES DRAWN AEROBIC ONLY Blood Culture adequate volume Performed at [facility] [address] [place], [place] [address]    Culture   Final    NO GROWTH 2 DAYS Performed at [place] [address], [place], [place] [address]    Report Status PENDING  Incomplete  Expectorated Sputum Assessment w Gram Stain, Rflx to Resp Cult     Status: None   Collection Time: 06/20/22   5:50 AM   Specimen: Expectorated Sputum  Result Value Ref Range Status   Specimen Description EXPECTORATED SPUTUM  Final   Special Requests Normal  Final   Sputum evaluation   Final    THIS SPECIMEN IS ACCEPTABLE FOR SPUTUM CULTURE Performed at [facility], [place] [address], [place], [place] [address]    Report Status 06/20/2022 FINAL  Final  Culture, Respiratory w Gram Stain     Status: None (Preliminary result)   Collection Time: 06/20/22  5:50 AM  Result Value Ref Range Status   Specimen Description   Final    EXPECTORATED SPUTUM Performed at [place] [address] [place], [place] [address]    Special Requests   Final    Normal Reflexed from [phone] Performed at [facility], [place] [address], [place], [place] [address]    Gram Stain   Final    RARE WBC PRESENT,BOTH PMN AND MONONUCLEAR RARE GRAM POSITIVE COCCI IN PAIRS RARE GRAM POSITIVE COCCI IN CHAINS RARE GRAM NEGATIVE RODS    Culture   Final    CULTURE REINCUBATED FOR BETTER GROWTH Performed at [facility], [place] [address] [place],  [address]    Report Status PENDING  Incomplete    Labs: CBC: Recent Labs  Lab 06/19/22 0411 06/19/22 1400 06/19/22 1620 06/19/22 2122 06/20/22 0556 06/21/22 0604  WBC 11.6* 9.0 8.7 7.7 6.2 4.5  NEUTROABS 7.8* PENDING 5.7  --  3.0  --   HGB 12.3 14.0 10.2* 10.6* 10.7* 10.2*  HCT 38.4 44.5 31.8* 33.8* 34.5* 33.0*  MCV 79.7* 79.2* 78.1* 79.7* 80.8 80.9  PLT 380 365 341 345 326 520   Basic Metabolic Panel: Recent Labs  Lab 06/18/22 1122 06/19/22 0411 06/19/22 1512 06/19/22 2122 06/20/22 0556 06/21/22 0604  NA 139 141 139  --  141 142  K 3.9 3.3* 3.2*  --  3.4* 3.7  CL 102 105 108  --  107 109  CO2 '25 22 22  '$ --  26 27  GLUCOSE 138* 113* 85  --  77 91  BUN '18 19 16  '$ --  10 7  CREATININE 0.61 0.97 0.79 0.74 0.62 0.54  CALCIUM 9.3 9.1 7.6*  --  8.1* 8.1*  MG  --   --   --   --  2.3 2.6*  PHOS  --    --   --   --  3.6  --    Liver Function Tests: Recent Labs  Lab 06/19/22 1512 06/21/22 0604  AST 25 16  ALT 29 26  ALKPHOS 61 54  BILITOT 0.4 0.4  PROT 6.6 6.7  ALBUMIN 3.2* 3.0*   CBG: No results for input(s): "GLUCAP" in the last 168 hours.  Discharge time spent: greater than 30 minutes.  Signed: Flora Lipps, MD [facility] 06/21/2022

## 2022-06-22 LAB — CULTURE, RESPIRATORY W GRAM STAIN
Culture: NORMAL
Special Requests: NORMAL

## 2022-06-23 ENCOUNTER — Encounter: Payer: Self-pay | Admitting: Family Medicine

## 2022-06-23 LAB — CBC WITH DIFFERENTIAL/PLATELET
HCT: 44.5 % (ref 36.0–46.0)
Hemoglobin: 14 g/dL (ref 12.0–15.0)
MCH: 24.9 pg — ABNORMAL LOW (ref 26.0–34.0)
MCHC: 31.5 g/dL (ref 30.0–36.0)
MCV: 79.2 fL — ABNORMAL LOW (ref 80.0–100.0)
Platelets: 365 10*3/uL (ref 150–400)
RBC: 5.62 MIL/uL — ABNORMAL HIGH (ref 3.87–5.11)
RDW: 15.5 % (ref 11.5–15.5)
WBC: 9 10*3/uL (ref 4.0–10.5)
nRBC: 0 % (ref 0.0–0.2)

## 2022-06-23 NOTE — Telephone Encounter (Signed)
Patient is taking Augmentin due to cost instead of prescribed meds at discharge. Questions if she needs to take Z-pack as well, sputum is now yellow again, afebrile.  See MyChart message with video/audio of breathing.  Also, has vaginal irritation/yeast infection requesting cream or suppository. Has taken 2 doses of Diflucan.   Has f/u appt in person with Debbrah Alar on 06/25/22.   Please advise regarding antibiotic and vaginal cream/supp.    Transition Care Management Follow-up Telephone Call Date of discharge and from where: 06/21/21 How have you been since you were released from the hospital? "It's been a rough morning" Any questions or concerns? Yes  Items Reviewed: Did the pt receive and understand the discharge instructions provided? Yes  Medications obtained and verified? Yes  Other?  Questions with antx treatment, message sent to PCP Any new allergies since your discharge? No  Dietary orders reviewed? No Do you have support at home? Yes   Home Care and Equipment/Supplies: Were home health services ordered? no If so, what is the name of the agency? NA  Has the agency set up a time to come to the patient's home? not applicable Were any new equipment or medical supplies ordered?  No What is the name of the medical supply agency? NA Were you able to get the supplies/equipment? not applicable Do you have any questions related to the use of the equipment or supplies? No  Functional Questionnaire: (I = Independent and D = Dependent) ADLs: I  Bathing/Dressing- I  Meal Prep- I  Eating- I  Maintaining continence- I  Transferring/Ambulation- I  Managing Meds- I  Follow up appointments reviewed:  PCP Hospital f/u appt confirmed? Yes  Scheduled to see Debbrah Alar, NP on Wednesday, 06/25/22 @ 0840. [facility] f/u appt confirmed? No   Are transportation arrangements needed? No  If their condition worsens, is the pt aware to call PCP or go to the  Emergency Dept.? Yes Was the patient provided with contact information for the PCP's office or ED? Yes Was to pt encouraged to call back with questions or concerns? Yes

## 2022-06-24 ENCOUNTER — Encounter: Payer: Self-pay | Admitting: Family

## 2022-06-24 LAB — CULTURE, BLOOD (ROUTINE X 2)
Culture: NO GROWTH
Culture: NO GROWTH
Special Requests: ADEQUATE
Special Requests: ADEQUATE

## 2022-06-24 MED ORDER — METHYLPREDNISOLONE 4 MG PO TBPK: ORAL_TABLET | ORAL | 0 refills | Status: DC

## 2022-06-24 NOTE — Telephone Encounter (Signed)
Spoke with patient, states she is feeling better than last week. Continues to cough while on phone.  Patient does not want to take additional antibiotics but agrees to Dose Jamison Neighbor, per PCP, sent to pharmacy.

## 2022-06-25 ENCOUNTER — Ambulatory Visit (HOSPITAL_BASED_OUTPATIENT_CLINIC_OR_DEPARTMENT_OTHER)
Admission: RE | Admit: 2022-06-25 | Discharge: 2022-06-25 | Disposition: A | Discharge status: Home / Self Care | Payer: PRIVATE HEALTH INSURANCE | Source: Ambulatory Visit | Attending: Family | Admitting: Family

## 2022-06-25 ENCOUNTER — Other Ambulatory Visit (HOSPITAL_COMMUNITY)
Admission: RE | Admit: 2022-06-25 | Discharge: 2022-06-25 | Disposition: A | Discharge status: Home / Self Care | Payer: PRIVATE HEALTH INSURANCE | Source: Ambulatory Visit | Attending: Family | Admitting: Family

## 2022-06-25 ENCOUNTER — Ambulatory Visit (INDEPENDENT_AMBULATORY_CARE_PROVIDER_SITE_OTHER): Payer: PRIVATE HEALTH INSURANCE | Admitting: Family

## 2022-06-25 VITALS — BP 111/77 | HR 83 | Temp 97.6°F | Resp 16 | Wt 219.0 lb

## 2022-06-25 DIAGNOSIS — Z23 Encounter for immunization: Secondary | ICD-10-CM | POA: Diagnosis not present

## 2022-06-25 DIAGNOSIS — R197 Diarrhea, unspecified: Secondary | ICD-10-CM | POA: Diagnosis not present

## 2022-06-25 DIAGNOSIS — N921 Excessive and frequent menstruation with irregular cycle: Secondary | ICD-10-CM

## 2022-06-25 DIAGNOSIS — N760 Acute vaginitis: Secondary | ICD-10-CM | POA: Diagnosis present

## 2022-06-25 DIAGNOSIS — R739 Hyperglycemia, unspecified: Secondary | ICD-10-CM

## 2022-06-25 DIAGNOSIS — E785 Hyperlipidemia, unspecified: Secondary | ICD-10-CM | POA: Diagnosis not present

## 2022-06-25 DIAGNOSIS — J189 Pneumonia, unspecified organism: Secondary | ICD-10-CM | POA: Diagnosis present

## 2022-06-25 DIAGNOSIS — E559 Vitamin D deficiency, unspecified: Secondary | ICD-10-CM | POA: Diagnosis not present

## 2022-06-25 DIAGNOSIS — T7840XA Allergy, unspecified, initial encounter: Secondary | ICD-10-CM | POA: Insufficient documentation

## 2022-06-25 LAB — CBC WITH DIFFERENTIAL/PLATELET
Basophils Absolute: 0 10*3/uL (ref 0.0–0.1)
Basophils Relative: 0.5 % (ref 0.0–3.0)
Eosinophils Absolute: 0.2 10*3/uL (ref 0.0–0.7)
Eosinophils Relative: 1.6 % (ref 0.0–5.0)
HCT: 36.4 % (ref 36.0–46.0)
Hemoglobin: 11.7 g/dL — ABNORMAL LOW (ref 12.0–15.0)
Lymphocytes Relative: 31.2 % (ref 12.0–46.0)
Lymphs Abs: 3 10*3/uL (ref 0.7–4.0)
MCHC: 32.1 g/dL (ref 30.0–36.0)
MCV: 79.8 fl (ref 78.0–100.0)
Monocytes Absolute: 0.9 10*3/uL (ref 0.1–1.0)
Monocytes Relative: 9.2 % (ref 3.0–12.0)
Neutro Abs: 5.5 10*3/uL (ref 1.4–7.7)
Neutrophils Relative %: 57.5 % (ref 43.0–77.0)
Platelets: 482 10*3/uL — ABNORMAL HIGH (ref 150.0–400.0)
RBC: 4.56 Mil/uL (ref 3.87–5.11)
RDW: 15.6 % — ABNORMAL HIGH (ref 11.5–15.5)
WBC: 9.5 10*3/uL (ref 4.0–10.5)

## 2022-06-25 LAB — COMPREHENSIVE METABOLIC PANEL
ALT: 21 U/L (ref 0–35)
AST: 18 U/L (ref 0–37)
Albumin: 3.9 g/dL (ref 3.5–5.2)
Alkaline Phosphatase: 54 U/L (ref 39–117)
BUN: 21 mg/dL (ref 6–23)
CO2: 29 mEq/L (ref 19–32)
Calcium: 8.8 mg/dL (ref 8.4–10.5)
Chloride: 102 mEq/L (ref 96–112)
Creatinine, Ser: 0.7 mg/dL (ref 0.40–1.20)
GFR: 113.4 mL/min (ref >60.00)
Glucose, Bld: 82 mg/dL (ref 70–99)
Potassium: 4.2 mEq/L (ref 3.5–5.1)
Sodium: 139 mEq/L (ref 135–145)
Total Bilirubin: 0.2 mg/dL (ref 0.2–1.2)
Total Protein: 6.9 g/dL (ref 6.0–8.3)

## 2022-06-25 LAB — IBC + FERRITIN
Ferritin: 15.2 ng/mL (ref 10.0–291.0)
Iron: 37 ug/dL — ABNORMAL LOW (ref 42–145)
Saturation Ratios: 9.5 % — ABNORMAL LOW (ref 20.0–50.0)
TIBC: 387.8 ug/dL (ref 250.0–450.0)
Transferrin: 277 mg/dL (ref 212.0–360.0)

## 2022-06-25 LAB — TSH: TSH: 1.33 u[IU]/mL (ref 0.35–5.50)

## 2022-06-25 LAB — HEMOGLOBIN A1C: Hgb A1c MFr Bld: 6.3 % (ref 4.6–6.5)

## 2022-06-25 LAB — LIPID PANEL
Cholesterol: 168 mg/dL (ref 0–200)
HDL: 38.7 mg/dL — ABNORMAL LOW (ref >39.00)
LDL Cholesterol: 104 mg/dL — ABNORMAL HIGH (ref 0–99)
NonHDL: 129.07
Total CHOL/HDL Ratio: 4
Triglycerides: 125 mg/dL (ref 0.0–149.0)
VLDL: 25 mg/dL (ref 0.0–40.0)

## 2022-06-25 LAB — VITAMIN D 25 HYDROXY (VIT D DEFICIENCY, FRACTURES): VITD: 10.15 ng/mL — ABNORMAL LOW (ref 30.00–100.00)

## 2022-06-25 MED ORDER — ONDANSETRON 8 MG PO TBDP: 8.0000 mg | ORAL_TABLET | Freq: Three times a day (TID) | ORAL | 0 refills | Status: DC | PRN

## 2022-06-25 MED ORDER — BENZONATATE 100 MG PO CAPS: 100.0000 mg | ORAL_CAPSULE | Freq: Three times a day (TID) | ORAL | 0 refills | Status: DC

## 2022-06-25 MED ORDER — ALBUTEROL SULFATE HFA 108 (90 BASE) MCG/ACT IN AERS: 2.0000 | INHALATION_SPRAY | Freq: Four times a day (QID) | RESPIRATORY_TRACT | 1 refills | Status: AC | PRN

## 2022-06-25 MED ORDER — ALBUTEROL SULFATE (5 MG/ML) 0.5% IN NEBU: 2.5000 mg | INHALATION_SOLUTION | Freq: Four times a day (QID) | RESPIRATORY_TRACT | 0 refills | Status: DC | PRN

## 2022-06-25 NOTE — Assessment & Plan Note (Signed)
Unclear if this was caused by a medication and if so, what medication caused it. Started after d/c and pt notes symptoms improving.

## 2022-06-25 NOTE — Assessment & Plan Note (Signed)
Clinically improving, advise her to start steroid taper called in by pcp. Complete amoxicillin.  Continue albuterol. Obtain follow up CXR. Call if symptoms worsen or if symptoms do not improve. Pt was given work excuse for another week at home as she has not gotten her strength back yet.

## 2022-06-25 NOTE — Addendum Note (Signed)
Addended by: Jiles Prows on: 06/25/2022 10:56 AM   Modules accepted: Orders

## 2022-06-25 NOTE — Addendum Note (Signed)
Addended by: Kelle Darting A on: 06/25/2022 10:51 AM   Modules accepted: Orders

## 2022-06-25 NOTE — Patient Instructions (Signed)
Complete lab work prior to leaving. Complete chest x-ray on the first floor. Start steroid medication sent to pharmacy as well as over the counter Monistat-7. Continue albuterol (nebulizer or inhaler) every 4-6 hours as needed.  Call if your symptoms do not continue to improve.

## 2022-06-25 NOTE — Assessment & Plan Note (Addendum)
Recommended otc clotrimazole cream x 7 days. Pt obtained self wet prep sample.

## 2022-06-25 NOTE — Assessment & Plan Note (Signed)
New.  Will have pt complete specimen for C diff.  Given recent antibiotic use.

## 2022-06-25 NOTE — Progress Notes (Addendum)
Subjective:   By signing my name below, I, Shehryar Baig, attest that this documentation has been prepared under the direction and in the presence of Debbrah Alar, NP. 06/25/2022    Patient ID: Melissa [name], female    DOB: [birth year], 31 y.o.   MRN: [id number]  Chief Complaint  Patient presents with   Hospitalization Follow-up    HPI Patient is in today for a hospital follow up visit.   She was admitted to the hospital on 06/19/2022 for community acquired pneumonia of the left upper lobe of the lung and sepsis due to pneumonia. She was given an ultra sound of the abdomen on the RUQ. Her results showed she was negative right upper quadrant ultrasound.  On discharge she was recommended to:  Follow-up with your primary care physician in 1 week.  Recommend chest x-ray in 4 to 6 weeks to ensure resolution of pneumonia. Complete the course of antibiotic.   Check CBC, BMP, magnesium LFT in the next visit.   Cough- She continues coughing. She is interested in refill for tessalon to help manage her cough.   Anti-biotics- She continues taking amoxicillin at this time. She was prescribed Levaquin but did not receive it due to expensive cost.   Vaginal discharge- She complains of blood discharge from her vagina while taking amoxicillin. She has taken 1 dose of fluconazole at the hospital and one dose at home to manage her bloody vaginal discharge.   Breathing- Her breathing has improved today. Yesterday she was hearing crackling while breathing. She is taking albuterol inhaler when her breathing worsens.   Scabbing on lips- She complains of scabbing and swelling on her lips after she left the hospital. She has applied triamcinolone cream to manage her symptoms. She thinks it is an allergic reaction. She is not sure which medication may have caused this reaction.   Diarrhea- She complains of diarrhea.  Immunizations- She is interested in receiving the flu vaccine during this visit. She  is interested in receiving the new Covid-19 booster vaccine at a later date.    Health Maintenance Due  Topic Date Due   COVID-19 Vaccine (3 - Pfizer risk series) 02/22/2020   INFLUENZA VACCINE  04/01/2022    Past Medical History:  Diagnosis Date   Abnormal Pap smear of cervix 07/11/2011   Acid reflux    Anxiety    Asthma    activity induced   Irregular menses    Polycystic ovarian syndrome     Past Surgical History:  Procedure Laterality Date   NO PAST SURGERIES      Family History  Problem Relation Age of Onset   Gallbladder disease Mother    Colon polyps Mother    Hypertension Father    Other Father        Hepatitis A   Diabetes Father    Drug abuse Father    Post-traumatic stress disorder Father    Stomach cancer Maternal Grandmother    Diabetes Maternal Grandmother    Colon polyps Maternal Grandmother    Colon cancer Maternal Grandmother    Cancer Maternal Grandmother        stomach cancer   Alcohol abuse Maternal Grandmother    Emphysema Maternal Grandfather    Cancer Maternal Grandfather        smoke, lung cancer   Alcohol abuse Maternal Grandfather    Heart disease Paternal Grandfather    Alcohol abuse Paternal Grandfather    Breast cancer Other  mat great aunts x 2   Colon cancer Other        mat great uncles x 2   Irritable bowel syndrome Sister    Multiple sclerosis Sister    Bipolar disorder Sister    Colon polyps Paternal Uncle    Alcohol abuse Paternal Grandmother    Pancreatic cancer Neg Hx    Esophageal cancer Neg Hx     Social History   Socioeconomic History   Marital status: Married    Spouse name: Not on file   Number of children: 0   Years of education: Not on file   Highest education level: Not on file  Occupational History   Occupation: Lexicographer: [employer]   Tobacco Use   Smoking status: Never   Smokeless tobacco: Never  Vaping Use   Vaping Use: Never used  Substance and Sexual Activity   Alcohol  use: Not Currently    Alcohol/week: 4.0 - 7.0 standard drinks of alcohol    Types: 4 - 7 Cans of beer per week    Comment: 1 beer every other ay   Drug use: Not Currently    Types: Marijuana   Sexual activity: Yes    Partners: Male    Birth control/protection: None  Other Topics Concern   Not on file  Social History Narrative   Caffeine use:  1 can soda daily   Regular exercise:  Just started   2 sisters.      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    Social Determinants of Health   Financial Resource Strain: Not on file  Food Insecurity: No Food Insecurity (06/21/2022)   Hunger Vital Sign    Worried About Running Out of Food in the Last Year: Never true    Ran Out of Food in the Last Year: Never true  Transportation Needs: No Transportation Needs (06/21/2022)   PRAPARE - Hydrologist (Medical): No    Lack of Transportation (Non-Medical): No  Physical Activity: Not on file  Stress: Not on file  Social Connections: Not on file  Intimate Partner Violence: Not At Risk (06/21/2022)   Humiliation, Afraid, Rape, and Kick questionnaire    Fear of Current or Ex-Partner: No    Emotionally Abused: No    Physically Abused: No    Sexually Abused: No    Outpatient Medications Prior to Visit  Medication Sig Dispense Refill   amoxicillin (AMOXIL) 875 MG tablet Take 875 mg by mouth 2 (two) times daily.     busPIRone (BUSPAR) 15 MG tablet Take 1 tablet (15 mg total) by mouth 3 (three) times daily. 90 tablet 3   diazepam (VALIUM) 10 MG tablet Take 1 tablet (10 mg total) by mouth every 12 (twelve) hours as needed. for anxiety (Patient taking differently: Take 10 mg by mouth every 12 (twelve) hours as needed for anxiety.) 60 tablet 1   methylphenidate (RITALIN) 20 MG tablet Take 20 mg by mouth 3 (three) times daily  with meals.     norgestimate-ethinyl estradiol (ORTHO-CYCLEN) 0.25-35 MG-MCG tablet Take 1 tablet by mouth daily. 84 tablet 3   albuterol (PROVENTIL) (5 MG/ML) 0.5% nebulizer solution Take 0.5 mLs (2.5 mg total) by nebulization every 6 (six) hours as needed for wheezing or shortness of  breath. 20 mL 0   albuterol (VENTOLIN HFA) 108 (90 Base) MCG/ACT inhaler Inhale 1-2 puffs into the lungs every 6 (six) hours as needed for wheezing or shortness of breath. (Patient taking differently: Inhale 2 puffs into the lungs every 6 (six) hours as needed for wheezing or shortness of breath.) 6.7 g 0   benzonatate (TESSALON) 100 MG capsule Take 1 capsule (100 mg total) by mouth every 8 (eight) hours. 21 capsule 0   ondansetron (ZOFRAN-ODT) 8 MG disintegrating tablet Take 1 tablet (8 mg total) by mouth every 8 (eight) hours as needed for nausea or vomiting. 12 tablet 0   methylPREDNISolone (MEDROL DOSEPAK) 4 MG TBPK tablet 5 tabs by mouth x 1 day, then 4 tabs by mouth x 1 day, then 3 tabs by mouth x 1 day, then 2 tabs by mouth x 1 day, then 1 tab by mouth x 1 day. 15 tablet 0   Pseudoephedrine-Naproxen Na (ALEVE SINUS & HEADACHE PO) Take 1-2 tablets by mouth 2 (two) times daily as needed (for congestion).     venlafaxine XR (EFFEXOR XR) 37.5 MG 24 hr capsule 1 cap po daily x 7 days then increase to 2 caps po daily x 7 days (Patient not taking: Reported on 06/19/2022) 30 capsule 0   venlafaxine XR (EFFEXOR-XR) 150 MG 24 hr capsule Take 1 capsule (150 mg total) by mouth daily with breakfast. (Patient not taking: Reported on 06/19/2022) 30 capsule 2   No facility-administered medications prior to visit.    No Known Allergies  Review of Systems  Respiratory:  Positive for cough.   Gastrointestinal:  Positive for diarrhea.  Genitourinary:        (+)bloody vaginal discharge  Skin:        (+)scabbing on left upper lip       Objective:    Physical Exam Constitutional:      General: She is not in acute  distress.    Appearance: Normal appearance. She is not ill-appearing.  HENT:     Head: Normocephalic and atraumatic.     Right Ear: External ear normal.     Left Ear: External ear normal.  Eyes:     Extraocular Movements: Extraocular movements intact.     Pupils: Pupils are equal, round, and reactive to light.  Cardiovascular:     Rate and Rhythm: Normal rate and regular rhythm.     Heart sounds: Normal heart sounds. No murmur heard.    No gallop.  Pulmonary:     Effort: Pulmonary effort is normal.     Comments: Course upper airway rhonchi, otherwise lungs clear throughout Skin:    General: Skin is warm and dry.     Comments: Scabbing on left upper lip  Neurological:     Mental Status: She is alert and oriented to person, place, and time.  Psychiatric:        Judgment: Judgment normal.     BP 111/77 (BP Location: Right Arm, Patient Position: Sitting, Cuff Size: Small)   Pulse 83   Temp 97.6 F (36.4 C) (Oral)   Resp 16   Wt 219 lb (99.3 kg)   SpO2 96%   BMI 31.42 kg/m  Wt Readings from Last 3 Encounters:  06/25/22 219 lb (99.3 kg)  06/19/22 214 lb (97.1 kg)  06/18/22 214 lb (97.1 kg)       Assessment & Plan:   Problem List Items Addressed This Visit       Unprioritized   Hyperlipidemia  Relevant Orders   Lipid panel   Hyperglycemia   Relevant Orders   Hemoglobin A1c   TSH   Diarrhea - Primary    New.  Will have pt complete specimen for C diff.  Given recent antibiotic use.       Relevant Orders   Clostridium Difficile by PCR([facility])   Community acquired pneumonia of left lung    Clinically improving, advise her to start steroid taper called in by pcp. Complete amoxicillin.  Continue albuterol. Obtain follow up CXR. Call if symptoms worsen or if symptoms do not improve. Pt was given work excuse for another week at home as she has not gotten her strength back yet.       Relevant Medications   amoxicillin (AMOXIL) 875 MG tablet   benzonatate  (TESSALON) 100 MG capsule   albuterol (VENTOLIN HFA) 108 (90 Base) MCG/ACT inhaler   albuterol (PROVENTIL) (5 MG/ML) 0.5% nebulizer solution   Other Relevant Orders   DG Chest 2 View   Comp Met (CMET)   CBC with Differential/Platelet   Allergic reaction    Unclear if this was caused by a medication and if so, what medication caused it. Started after d/c and pt notes symptoms improving.       Acute vaginitis    Recommended otc clotrimazole cream x 7 days. Pt obtained self wet prep sample.      Relevant Orders   Cervicovaginal ancillary only( [facility])   40 minutes spent on today's visit.  Time was spent reviewing hospital records, examining patient, formulating medical plan.    Meds ordered this encounter  Medications   benzonatate (TESSALON) 100 MG capsule    Sig: Take 1 capsule (100 mg total) by mouth every 8 (eight) hours.    Dispense:  21 capsule    Refill:  0    Order Specific Question:   Supervising Provider    Answer:   Penni Homans A [[id number]]   albuterol (VENTOLIN HFA) 108 (90 Base) MCG/ACT inhaler    Sig: Inhale 2 puffs into the lungs every 6 (six) hours as needed for wheezing or shortness of breath.    Dispense:  8 g    Refill:  1    Order Specific Question:   Supervising Provider    Answer:   Penni Homans A [[id number]]   albuterol (PROVENTIL) (5 MG/ML) 0.5% nebulizer solution    Sig: Take 0.5 mLs (2.5 mg total) by nebulization every 6 (six) hours as needed for wheezing or shortness of breath.    Dispense:  20 mL    Refill:  0    Order Specific Question:   Supervising Provider    Answer:   Penni Homans A [[id number]]   ondansetron (ZOFRAN-ODT) 8 MG disintegrating tablet    Sig: Take 1 tablet (8 mg total) by mouth every 8 (eight) hours as needed for nausea or vomiting.    Dispense:  20 tablet    Refill:  0    Order Specific Question:   Supervising Provider    Answer:   Penni Homans A [[id number]]    I, Nance Pear, NP, personally preformed the services described  in this documentation.  All medical record entries made by the scribe were at my direction and in my presence.  I have reviewed the chart and discharge instructions (if applicable) and agree that the record reflects my personal performance and is accurate and complete. 06/25/2022   I,Shehryar Baig,acting as a scribe for Nance Pear, NP.,have  documented all relevant documentation on the behalf of Nance Pear, NP,as directed by  Nance Pear, NP while in the presence of Nance Pear, NP.   Nance Pear, NP

## 2022-06-26 ENCOUNTER — Other Ambulatory Visit: Payer: Self-pay

## 2022-06-26 ENCOUNTER — Telehealth: Payer: Self-pay

## 2022-06-26 DIAGNOSIS — E538 Deficiency of other specified B group vitamins: Secondary | ICD-10-CM

## 2022-06-26 DIAGNOSIS — E559 Vitamin D deficiency, unspecified: Secondary | ICD-10-CM

## 2022-06-26 LAB — CERVICOVAGINAL ANCILLARY ONLY
Bacterial Vaginitis (gardnerella): NEGATIVE
Candida Glabrata: NEGATIVE
Candida Vaginitis: POSITIVE — AB
Chlamydia: NEGATIVE
Comment: NEGATIVE
Comment: NEGATIVE
Comment: NEGATIVE
Comment: NEGATIVE
Comment: NEGATIVE
Comment: NORMAL
Neisseria Gonorrhea: NEGATIVE
Trichomonas: NEGATIVE

## 2022-06-26 MED ORDER — VITAMIN D (ERGOCALCIFEROL) 1.25 MG (50000 UNIT) PO CAPS: 50000.0000 [IU] | ORAL_CAPSULE | ORAL | 4 refills | Status: DC

## 2022-06-26 NOTE — Telephone Encounter (Signed)
Done

## 2022-06-27 ENCOUNTER — Other Ambulatory Visit: Payer: Self-pay | Admitting: Family Medicine

## 2022-06-27 MED ORDER — METHYLPHENIDATE HCL 20 MG PO TABS: 20.0000 mg | ORAL_TABLET | Freq: Three times a day (TID) | ORAL | 0 refills | Status: DC

## 2022-07-03 ENCOUNTER — Encounter: Payer: Self-pay | Admitting: Family

## 2022-07-08 ENCOUNTER — Other Ambulatory Visit: Payer: Self-pay | Admitting: Family Medicine

## 2022-07-08 DIAGNOSIS — F411 Generalized anxiety disorder: Secondary | ICD-10-CM

## 2022-07-09 ENCOUNTER — Other Ambulatory Visit: Payer: PRIVATE HEALTH INSURANCE

## 2022-07-09 ENCOUNTER — Other Ambulatory Visit: Payer: Self-pay | Admitting: Family Medicine

## 2022-07-09 DIAGNOSIS — F411 Generalized anxiety disorder: Secondary | ICD-10-CM

## 2022-07-16 ENCOUNTER — Other Ambulatory Visit: Payer: Self-pay | Admitting: Family Medicine

## 2022-07-16 ENCOUNTER — Telehealth: Payer: Self-pay | Admitting: Family Medicine

## 2022-07-16 ENCOUNTER — Other Ambulatory Visit: Payer: Self-pay

## 2022-07-16 DIAGNOSIS — F411 Generalized anxiety disorder: Secondary | ICD-10-CM

## 2022-07-16 MED ORDER — DIAZEPAM 10 MG PO TABS: 10.0000 mg | ORAL_TABLET | Freq: Two times a day (BID) | ORAL | 1 refills | Status: DC | PRN

## 2022-07-16 MED ORDER — METHYLPHENIDATE HCL 20 MG PO TABS: 20.0000 mg | ORAL_TABLET | Freq: Three times a day (TID) | ORAL | 0 refills | Status: DC

## 2022-07-16 NOTE — Telephone Encounter (Signed)
Requesting: Valium & Ritalin Contract: 06/17/2021 UDS: 06/17/2021 Last Visit: 06/25/2022  Next Visit: 07/22/2022 Last Refill: 06/16/2022 (Valium) 06/27/2022 (Ritalin)  Please Advise

## 2022-07-16 NOTE — Telephone Encounter (Signed)
Medication:   methylphenidate (CONCERTA) 36 MG PO CR tablet [[id number]]  DISCONTINUED   Has the patient contacted their pharmacy? No. (If no, request that the patient contact the pharmacy for the refill.) (If yes, when and what did the pharmacy advise?)  Preferred Pharmacy (with phone number or street name):   [facility] ([address] [place] [address] [place]) [place] [address] Phone: [phone]  Fax: [phone]   Agent: Please be advised that RX refills may take up to 3 business days. We ask that you follow-up with your pharmacy.

## 2022-07-17 NOTE — Telephone Encounter (Signed)
Pt called and lvm to return call 

## 2022-07-18 NOTE — Telephone Encounter (Signed)
Called pt lvm to call us back and sent a mychart message

## 2022-07-21 ENCOUNTER — Other Ambulatory Visit: Payer: Self-pay | Admitting: Family Medicine

## 2022-07-21 MED ORDER — METHYLPHENIDATE HCL ER (OSM) 36 MG PO TBCR: 36.0000 mg | EXTENDED_RELEASE_TABLET | Freq: Every day | ORAL | 0 refills | Status: DC

## 2022-07-21 MED ORDER — METHYLPHENIDATE HCL 10 MG PO TABS: 10.0000 mg | ORAL_TABLET | Freq: Every day | ORAL | 0 refills | Status: DC | PRN

## 2022-07-21 NOTE — Telephone Encounter (Signed)
Medication sent.

## 2022-07-22 ENCOUNTER — Telehealth (INDEPENDENT_AMBULATORY_CARE_PROVIDER_SITE_OTHER): Payer: PRIVATE HEALTH INSURANCE | Admitting: Family Medicine

## 2022-07-22 ENCOUNTER — Other Ambulatory Visit: Payer: Self-pay

## 2022-07-22 DIAGNOSIS — F909 Attention-deficit hyperactivity disorder, unspecified type: Secondary | ICD-10-CM

## 2022-07-22 DIAGNOSIS — F419 Anxiety disorder, unspecified: Secondary | ICD-10-CM

## 2022-07-22 DIAGNOSIS — R059 Cough, unspecified: Secondary | ICD-10-CM | POA: Diagnosis not present

## 2022-07-22 DIAGNOSIS — J4520 Mild intermittent asthma, uncomplicated: Secondary | ICD-10-CM

## 2022-07-22 DIAGNOSIS — F32A Depression, unspecified: Secondary | ICD-10-CM

## 2022-07-22 DIAGNOSIS — F411 Generalized anxiety disorder: Secondary | ICD-10-CM | POA: Diagnosis not present

## 2022-07-22 MED ORDER — DIAZEPAM 10 MG PO TABS: 10.0000 mg | ORAL_TABLET | Freq: Two times a day (BID) | ORAL | 2 refills | Status: DC | PRN

## 2022-07-22 MED ORDER — METHYLPHENIDATE HCL ER (OSM) 54 MG PO TBCR: 54.0000 mg | EXTENDED_RELEASE_TABLET | ORAL | 0 refills | Status: DC

## 2022-07-22 NOTE — Progress Notes (Shared)
MyChart Video Visit Virtual Visit via Video Note   This visit type was conducted due to national recommendations for restrictions regarding the COVID-19 Pandemic (e.g. social distancing) in an effort to limit this patient's exposure and mitigate transmission in our community. This patient is at least at moderate risk for complications without adequate follow up. This format is felt to be most appropriate for this patient at this time. Physical exam was limited by quality of the video and audio technology used for the visit. [name], CMA, was able to get the patient set up on a video visit.  Patient location: Patient at home and provider in visit Provider location: Office  I discussed the limitations of evaluation and management by telemedicine and the availability of in person appointments. The patient expressed understanding and agreed to proceed.  Visit Date: 07/22/2022  Today's healthcare provider: Penni Homans, MD     Subjective:    Patient ID: Melissa [name], female    DOB: [birth year], 31 y.o.   MRN: [id number]  No chief complaint on file.  HPI Patient is in today for a video visit.  Patient denies anxiety/CP/ palp.  ADHD/Anxiety Patient has been taking Ritalin 20 mg in the morning, followed by Concerta 36 mg in the afternoon to manage her ADHD. However, she is interested in decreasing to Ritalin 10 mg and increasing to Concerta 54 mg. She takes 2 Diazepam 10 mg nightly to help her sleep.  SOB Patient is still recovering from Pneumonia and has been coughing. She reports having SOB in the morning and is using her Albuterol inhaler every 6-8 hours. She further reports that she occasionally uses the inhaler after having SOB from walking upstairs.  Refills Patient is requesting a refill on Diazepam 10 mg.  Past Medical History:  Diagnosis Date   Abnormal Pap smear of cervix 07/11/2011   Acid reflux    Anxiety    Asthma    activity induced   Irregular menses    Polycystic  ovarian syndrome    Past Surgical History:  Procedure Laterality Date   NO PAST SURGERIES     Family History  Problem Relation Age of Onset   Gallbladder disease Mother    Colon polyps Mother    Hypertension Father    Other Father        Hepatitis A   Diabetes Father    Drug abuse Father    Post-traumatic stress disorder Father    Stomach cancer Maternal Grandmother    Diabetes Maternal Grandmother    Colon polyps Maternal Grandmother    Colon cancer Maternal Grandmother    Cancer Maternal Grandmother        stomach cancer   Alcohol abuse Maternal Grandmother    Emphysema Maternal Grandfather    Cancer Maternal Grandfather        smoke, lung cancer   Alcohol abuse Maternal Grandfather    Heart disease Paternal Grandfather    Alcohol abuse Paternal Grandfather    Breast cancer Other        mat great aunts x 2   Colon cancer Other        mat great uncles x 2   Irritable bowel syndrome Sister    Multiple sclerosis Sister    Bipolar disorder Sister    Colon polyps Paternal Uncle    Alcohol abuse Paternal Grandmother    Pancreatic cancer Neg Hx    Esophageal cancer Neg Hx    Social History   Socioeconomic History  Marital status: Married    Spouse name: Not on file   Number of children: 0   Years of education: Not on file   Highest education level: Not on file  Occupational History   Occupation: student    Employer: [employer]   Tobacco Use   Smoking status: Never   Smokeless tobacco: Never  Vaping Use   Vaping Use: Never used  Substance and Sexual Activity   Alcohol use: Not Currently    Alcohol/week: 4.0 - 7.0 standard drinks of alcohol    Types: 4 - 7 Cans of beer per week    Comment: 1 beer every other ay   Drug use: Not Currently    Types: Marijuana   Sexual activity: Yes    Partners: Male    Birth control/protection: None  Other Topics Concern   Not on file  Social History Narrative   Caffeine use:  1 can soda daily   Regular exercise:   Just started   2 sisters.      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    Social Determinants of Health   Financial Resource Strain: Not on file  Food Insecurity: No Food Insecurity (06/21/2022)   Hunger Vital Sign    Worried About Running Out of Food in the Last Year: Never true    Ran Out of Food in the Last Year: Never true  Transportation Needs: No Transportation Needs (06/21/2022)   PRAPARE - Hydrologist (Medical): No    Lack of Transportation (Non-Medical): No  Physical Activity: Not on file  Stress: Not on file  Social Connections: Not on file  Intimate Partner Violence: Not At Risk (06/21/2022)   Humiliation, Afraid, Rape, and Kick questionnaire    Fear of Current or Ex-Partner: No    Emotionally Abused: No    Physically Abused: No    Sexually Abused: No   Outpatient Medications Prior to Visit  Medication Sig Dispense Refill   methylphenidate (CONCERTA) 36 MG PO CR tablet Take 1 tablet (36 mg total) by mouth daily. 30 tablet 0   methylphenidate (RITALIN) 10 MG tablet Take 1 tablet (10 mg total) by mouth daily as needed. 30 tablet 0   albuterol (PROVENTIL) (5 MG/ML) 0.5% nebulizer solution Take 0.5 mLs (2.5 mg total) by nebulization every 6 (six) hours as needed for wheezing or shortness of breath. 20 mL 0   albuterol (VENTOLIN HFA) 108 (90 Base) MCG/ACT inhaler Inhale 2 puffs into the lungs every 6 (six) hours as needed for wheezing or shortness of breath. 8 g 1   amoxicillin (AMOXIL) 875 MG tablet Take 875 mg by mouth 2 (two) times daily.     benzonatate (TESSALON) 100 MG capsule Take 1 capsule (100 mg total) by mouth every 8 (eight) hours. 21 capsule 0   busPIRone (BUSPAR) 15 MG tablet Take 1 tablet (15 mg total) by mouth 3 (three) times daily. 270 tablet 0   diazepam (VALIUM) 10 MG tablet Take 1  tablet (10 mg total) by mouth every 12 (twelve) hours as needed. for anxiety 60 tablet 1   norgestimate-ethinyl estradiol (ORTHO-CYCLEN) 0.25-35 MG-MCG tablet Take 1 tablet by mouth daily. 84 tablet 3   ondansetron (ZOFRAN-ODT) 8 MG disintegrating tablet Take 1 tablet (8 mg total) by mouth every 8 (  eight) hours as needed for nausea or vomiting. 20 tablet 0   Vitamin D, Ergocalciferol, (DRISDOL) 1.25 MG (50000 UNIT) CAPS capsule Take 1 capsule (50,000 Units total) by mouth every 7 (seven) days. 4 capsule 4   No facility-administered medications prior to visit.   No Known Allergies  Review of Systems  Respiratory:  Positive for cough and shortness of breath.   Cardiovascular:  Negative for chest pain and palpitations.      Objective:    Physical Exam  There were no vitals taken for this visit. Wt Readings from Last 3 Encounters:  06/25/22 219 lb (99.3 kg)  06/19/22 214 lb (97.1 kg)  06/18/22 214 lb (97.1 kg)   Diabetic Foot Exam - Simple   No data filed    Lab Results  Component Value Date   WBC 9.5 06/25/2022   HGB 11.7 (L) 06/25/2022   HCT 36.4 06/25/2022   PLT 482.0 (H) 06/25/2022   GLUCOSE 82 06/25/2022   CHOL 168 06/25/2022   TRIG 125.0 06/25/2022   HDL 38.70 (L) 06/25/2022   LDLCALC 104 (H) 06/25/2022   ALT 21 06/25/2022   AST 18 06/25/2022   NA 139 06/25/2022   K 4.2 06/25/2022   CL 102 06/25/2022   CREATININE 0.70 06/25/2022   BUN 21 06/25/2022   CO2 29 06/25/2022   TSH 1.33 06/25/2022   INR 1.1 06/19/2022   HGBA1C 6.3 06/25/2022   Lab Results  Component Value Date   TSH 1.33 06/25/2022   Lab Results  Component Value Date   WBC 9.5 06/25/2022   HGB 11.7 (L) 06/25/2022   HCT 36.4 06/25/2022   MCV 79.8 06/25/2022   PLT 482.0 (H) 06/25/2022   Lab Results  Component Value Date   NA 139 06/25/2022   K 4.2 06/25/2022   CO2 29 06/25/2022   GLUCOSE 82 06/25/2022   BUN 21 06/25/2022   CREATININE 0.70 06/25/2022   BILITOT 0.2 06/25/2022   ALKPHOS 54  06/25/2022   AST 18 06/25/2022   ALT 21 06/25/2022   PROT 6.9 06/25/2022   ALBUMIN 3.9 06/25/2022   CALCIUM 8.8 06/25/2022   ANIONGAP 6 06/21/2022   GFR 113.40 06/25/2022   Lab Results  Component Value Date   CHOL 168 06/25/2022   Lab Results  Component Value Date   HDL 38.70 (L) 06/25/2022   Lab Results  Component Value Date   LDLCALC 104 (H) 06/25/2022   Lab Results  Component Value Date   TRIG 125.0 06/25/2022   Lab Results  Component Value Date   CHOLHDL 4 06/25/2022   Lab Results  Component Value Date   HGBA1C 6.3 06/25/2022      Assessment & Plan:   Chest x-ray ordered if respiratory distress continues/worsens.  Concerta increased from 36 mg to 54 mg.  Ritalin decreased form 20 mg to 10 mg. Problem List Items Addressed This Visit   None  No orders of the defined types were placed in this encounter.  I discussed the assessment and treatment plan with the patient. The patient was provided an opportunity to ask questions and all were answered. The patient agreed with the plan and demonstrated an understanding of the instructions.   The patient was advised to call back or seek an in-person evaluation if the symptoms worsen or if the condition fails to improve as anticipated.  I provided 20 minutes of face-to-face time during this encounter.  I,Mohammed Iqbal,acting as a scribe for Penni Homans, MD.,have documented all relevant  documentation on the behalf of Penni Homans, MD,as directed by  Penni Homans, MD while in the presence of Penni Homans, MD.  Penni Homans, MD [facility] [phone] (phone) [phone] (fax)  [place]

## 2022-07-22 NOTE — Patient Instructions (Signed)
Take the high dose Vitamin D 50000 IU cap 1 cap weekly take over the counter Vitamin D3 2000 IU daily on all other days

## 2022-07-23 DIAGNOSIS — R059 Cough, unspecified: Secondary | ICD-10-CM | POA: Insufficient documentation

## 2022-07-23 NOTE — Assessment & Plan Note (Signed)
Still struggling to get control. Will increase Concerta from 36 to 54 and drop her prn Ritalin from 20 to 10 mg

## 2022-07-23 NOTE — Assessment & Plan Note (Addendum)
Encouraged increased rest and hydration, add probiotics, zinc such as Coldeze or Xicam. Treat fevers as needed will only repeat CXR if patient symptoms do not continue to improve

## 2022-07-23 NOTE — Assessment & Plan Note (Signed)
She feels this  is stabilizing given refill on Diazepam prn

## 2022-08-15 ENCOUNTER — Telehealth: Payer: Self-pay | Admitting: Family Medicine

## 2022-08-15 DIAGNOSIS — F411 Generalized anxiety disorder: Secondary | ICD-10-CM

## 2022-08-15 NOTE — Telephone Encounter (Signed)
Medication: diazepam (VALIUM) 10 MG tablet   Has the patient contacted their pharmacy? No.   Preferred Pharmacy (with phone number or street name):  [facility] ([address] [place] [address] [place]) [place] [address] Phone: [phone]  Fax: [phone]    Agent: Please be advised that RX refills may take up to 3 business days. We ask that you follow-up with your pharmacy.

## 2022-08-15 NOTE — Telephone Encounter (Signed)
Pharmacy should have 2 refills on file already.

## 2022-08-23 ENCOUNTER — Ambulatory Visit: Admit: 2022-08-23 | Discharge status: Absent without leave | Payer: PRIVATE HEALTH INSURANCE

## 2022-09-01 ENCOUNTER — Telehealth: Payer: PRIVATE HEALTH INSURANCE | Admitting: Nurse Practitioner

## 2022-09-01 DIAGNOSIS — R112 Nausea with vomiting, unspecified: Secondary | ICD-10-CM | POA: Diagnosis not present

## 2022-09-01 MED ORDER — ONDANSETRON HCL 4 MG PO TABS: 4.0000 mg | ORAL_TABLET | Freq: Three times a day (TID) | ORAL | 0 refills | Status: DC | PRN

## 2022-09-01 NOTE — Progress Notes (Signed)
E-Visit for Nausea and Vomiting   We are sorry that you are not feeling well. Here is how we plan to help!  Be sure you are taking your antibiotic with food. You should start your meal, then take the antibiotic then finish your meal. You may also benefit from using a probiotic over the counter.   I have prescribed a medication that will help alleviate your symptoms and allow you to stay hydrated:  Zofran 4 mg 1 tablet every 8 hours as needed for nausea and vomiting  HOME CARE: Drink clear liquids.  This is very important! Dehydration (the lack of fluid) can lead to a serious complication.  Start off with 1 tablespoon every 5 minutes for 8 hours. You may begin eating bland foods after 8 hours without vomiting.  Start with saltine crackers, white bread, rice, mashed potatoes, applesauce. After 48 hours on a bland diet, you may resume a normal diet. Try to go to sleep.  Sleep often empties the stomach and relieves the need to vomit.  GET HELP RIGHT AWAY IF:  Your symptoms do not improve or worsen within 2 days after treatment. You have a fever for over 3 days. You cannot keep down fluids after trying the medication.  MAKE SURE YOU:  Understand these instructions. Will watch your condition. Will get help right away if you are not doing well or get worse.    Thank you for choosing an e-visit.  Your e-visit answers were reviewed by a board certified advanced clinical practitioner to complete your personal care plan. Depending upon the condition, your plan could have included both over the counter or prescription medications.  Please review your pharmacy choice. Make sure the pharmacy is open so you can pick up prescription now. If there is a problem, you may contact your provider through [employer] and have the prescription routed to another pharmacy.  Your safety is important to [place]. If you have drug allergies check your prescription carefully.   Meds ordered this encounter   Medications   ondansetron (ZOFRAN) 4 MG tablet    Sig: Take 1 tablet (4 mg total) by mouth every 8 (eight) hours as needed for nausea or vomiting.    Dispense:  20 tablet    Refill:  0     I spent approximately 5 minutes reviewing the patient's history, current symptoms and coordinating their care today.

## 2022-09-02 ENCOUNTER — Other Ambulatory Visit: Payer: Self-pay | Admitting: Family

## 2022-09-02 ENCOUNTER — Other Ambulatory Visit: Payer: Self-pay | Admitting: Family Medicine

## 2022-09-02 ENCOUNTER — Encounter: Payer: Self-pay | Admitting: Family Medicine

## 2022-09-02 MED ORDER — ONDANSETRON 8 MG PO TBDP: 8.0000 mg | ORAL_TABLET | Freq: Three times a day (TID) | ORAL | 0 refills | Status: DC | PRN

## 2022-09-02 MED ORDER — METHYLPHENIDATE HCL 20 MG PO TABS: 20.0000 mg | ORAL_TABLET | Freq: Three times a day (TID) | ORAL | 0 refills | Status: DC

## 2022-09-10 ENCOUNTER — Telehealth: Payer: Self-pay | Admitting: Family Medicine

## 2022-09-10 NOTE — Telephone Encounter (Signed)
Patient called to advise that she has been having tingling in her left arm down to her fingers and they are tingling. Patient says her blood pressure has been high. She declined visit today. Transferred to triage.

## 2022-09-10 NOTE — Telephone Encounter (Signed)
Nurse Assessment Nurse: Tanya Nones, RN, [place] Date/Time (Eastern Time): 09/10/2022 3:46:28 PM Confirm and document reason for call. If symptomatic, describe symptoms. ---Caller states that she is having numbness in her left arm, bp is elevated 147/100 and that was yesterday when her arm was hurting the worst. States that it began after a very stressful event in her life 5 days ago. Does the patient have any new or worsening symptoms? ---Yes Will a triage be completed? ---Yes Related visit to physician within the last 2 weeks? ---No Does the PT have any chronic conditions? (i.e. diabetes, asthma, this includes High risk factors for pregnancy, etc.) ---Yes List chronic conditions. ---activity induced asthma Is the patient pregnant or possibly pregnant? (Ask all females between the ages of 57-55) ---No Is this a behavioral health or substance abuse call? ---No Guidelines Guideline Title Affirmed Question Affirmed Notes Nurse Date/Time Eilene Ghazi Time) Neurologic Deficit Neck pain (and neurologic deficit) Tanya Nones, [place], [place] 09/10/2022 3:48:32 PM PLEASE NOTE: All timestamps contained within this report are represented as [place] Standard Time. CONFIDENTIALTY NOTICE: This fax transmission is intended only for the addressee. It contains information that is legally privileged, confidential or otherwise protected from use or disclosure. If you are not the intended recipient, you are strictly prohibited from reviewing, disclosing, copying using or disseminating any of this information or taking any action in reliance on or regarding this information. If you have received this fax in error, please notify us immediately by telephone so that we can arrange for its return to [place]. Phone: [phone], Toll-Free: [phone], Fax: [phone] Page: 2 of 2 Call Id: [id number] [place]. Time Eilene Ghazi Time) Disposition Final User 09/10/2022 3:43:39 PM Send to Urgent Rose [facility]ers 09/10/2022  3:52:40 PM See HCP within 4 Hours (or PCP triage) Yes Tanya Nones, [place], [place] Final Disposition 09/10/2022 3:52:40 PM See HCP within 4 Hours (or PCP triage) Yes Tanya Nones, RN, [place] Shipper Disagree/Comply Comply Caller Understands Yes PreDisposition Go to Urgent Care/Walk-In Clinic Care Advice Given Per Guideline SEE HCP (OR PCP TRIAGE) WITHIN 4 HOURS: CALL BACK IF: * You become worse CARE ADVICE given per Neurologic Deficit (Adult) guideline. * IF OFFICE WILL BE OPEN: You need to be seen within the next 3 or 4 hours. Call your doctor (or NP/ PA) now or as soon as the office opens. Referrals [facility] [place]

## 2022-09-11 ENCOUNTER — Encounter (HOSPITAL_BASED_OUTPATIENT_CLINIC_OR_DEPARTMENT_OTHER): Payer: Self-pay

## 2022-09-11 ENCOUNTER — Emergency Department (HOSPITAL_BASED_OUTPATIENT_CLINIC_OR_DEPARTMENT_OTHER): Payer: PRIVATE HEALTH INSURANCE | Admitting: Radiology

## 2022-09-11 DIAGNOSIS — R202 Paresthesia of skin: Secondary | ICD-10-CM | POA: Insufficient documentation

## 2022-09-11 DIAGNOSIS — M79602 Pain in left arm: Secondary | ICD-10-CM | POA: Insufficient documentation

## 2022-09-11 LAB — BASIC METABOLIC PANEL
Anion gap: 8 (ref 5–15)
BUN: 14 mg/dL (ref 6–20)
CO2: 28 mmol/L (ref 22–32)
Calcium: 9.3 mg/dL (ref 8.9–10.3)
Chloride: 102 mmol/L (ref 98–111)
Creatinine, Ser: 0.74 mg/dL (ref 0.44–1.00)
GFR, Estimated: >60 mL/min (ref >60)
Glucose, Bld: 86 mg/dL (ref 70–99)
Potassium: 4.2 mmol/L (ref 3.5–5.1)
Sodium: 138 mmol/L (ref 135–145)

## 2022-09-11 LAB — CBC
HCT: 38.2 % (ref 36.0–46.0)
Hemoglobin: 11.7 g/dL — ABNORMAL LOW (ref 12.0–15.0)
MCH: 23.8 pg — ABNORMAL LOW (ref 26.0–34.0)
MCHC: 30.6 g/dL (ref 30.0–36.0)
MCV: 77.8 fL — ABNORMAL LOW (ref 80.0–100.0)
Platelets: 449 10*3/uL — ABNORMAL HIGH (ref 150–400)
RBC: 4.91 MIL/uL (ref 3.87–5.11)
RDW: 18.5 % — ABNORMAL HIGH (ref 11.5–15.5)
WBC: 6.3 10*3/uL (ref 4.0–10.5)
nRBC: 0 % (ref 0.0–0.2)

## 2022-09-11 LAB — TROPONIN I (HIGH SENSITIVITY): Troponin I (High Sensitivity): 3 ng/L (ref <18)

## 2022-09-11 LAB — PREGNANCY, URINE: Preg Test, Ur: NEGATIVE

## 2022-09-11 NOTE — Telephone Encounter (Signed)
Called pt regarding elevated Bp and numbness in left Arm with chest pain, was advised need to go ER. Pt refused to go ER but agreed to  Been seen tomorrow appt made.

## 2022-09-11 NOTE — ED Triage Notes (Addendum)
Pt c/o L arm pain, numbness/tingling onset 1/1, worsening yesterday/ today. Today, nausea & "what feels like heartburn," States she also had "an allergic rxn over Christmas, was given abx which she stopped taking after two days." No neuro deficit noted at time of triage. Pt advises that she has doctor's appt tomorrow

## 2022-09-11 NOTE — ED Notes (Signed)
Pt advised staff that PCP told her to "come to ED within 4hrs." Advised by staff that based on onset of symptoms (1/1), not as concerning r/t CVA. Pt then expressed concern r/t heart, stated "I don't want to just sit out in the lobby and die." Appropriate orders placed, acuity increased.

## 2022-09-12 ENCOUNTER — Ambulatory Visit: Payer: PRIVATE HEALTH INSURANCE | Admitting: Family Medicine

## 2022-09-12 ENCOUNTER — Emergency Department (HOSPITAL_BASED_OUTPATIENT_CLINIC_OR_DEPARTMENT_OTHER)
Admission: EM | Admit: 2022-09-12 | Discharge: 2022-09-12 | Disposition: A | Discharge status: Home / Self Care | Payer: PRIVATE HEALTH INSURANCE | Attending: Emergency Medicine | Admitting: Emergency Medicine

## 2022-09-12 DIAGNOSIS — R200 Anesthesia of skin: Secondary | ICD-10-CM

## 2022-09-12 MED ORDER — LIDOCAINE 5 % EX PTCH: 1.0000 | MEDICATED_PATCH | CUTANEOUS | 0 refills | Status: DC

## 2022-09-12 MED ORDER — PREDNISONE 10 MG PO TABS: 20.0000 mg | ORAL_TABLET | Freq: Two times a day (BID) | ORAL | 0 refills | Status: DC

## 2022-09-12 MED ORDER — PREDNISONE 20 MG PO TABS
40.0000 mg | ORAL_TABLET | Freq: Once | ORAL | Status: AC
  Administered 2022-09-12: 40 mg via ORAL
  Filled 2022-09-12: qty 2

## 2022-09-12 NOTE — Discharge Instructions (Signed)
Begin taking prednisone as prescribed.  Rest.  Follow-up with your primary doctor next week if symptoms are not improving.

## 2022-09-12 NOTE — ED Notes (Signed)
Reviewed AVS/discharge instruction with patient. Time allotted for and all questions answered. Patient is agreeable for d/c and escorted to ed exit by staff.  
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
## 2022-09-15 ENCOUNTER — Encounter: Payer: Self-pay | Admitting: Family Medicine

## 2022-09-15 ENCOUNTER — Telehealth (INDEPENDENT_AMBULATORY_CARE_PROVIDER_SITE_OTHER): Payer: PRIVATE HEALTH INSURANCE | Admitting: Family Medicine

## 2022-09-15 DIAGNOSIS — M5412 Radiculopathy, cervical region: Secondary | ICD-10-CM | POA: Diagnosis not present
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
## 2022-09-17 ENCOUNTER — Telehealth: Payer: PRIVATE HEALTH INSURANCE | Admitting: Family Medicine

## 2022-09-22 ENCOUNTER — Other Ambulatory Visit: Payer: Self-pay

## 2022-09-22 ENCOUNTER — Other Ambulatory Visit: Payer: Self-pay | Admitting: Family Medicine

## 2022-09-22 ENCOUNTER — Telehealth: Payer: Self-pay | Admitting: Family Medicine

## 2022-09-22 DIAGNOSIS — Z79899 Other long term (current) drug therapy: Secondary | ICD-10-CM

## 2022-09-22 MED ORDER — METHYLPHENIDATE HCL 20 MG PO TABS: 20.0000 mg | ORAL_TABLET | Freq: Three times a day (TID) | ORAL | 0 refills | Status: DC

## 2022-09-22 NOTE — Telephone Encounter (Signed)
Prescription Request  09/22/2022  Is this a "Controlled Substance" medicine? No  LOV: Visit date not found  What is the name of the medication or equipment?   methylphenidate (RITALIN) 20 MG tablet [[id number]]   Have you contacted your pharmacy to request a refill? Yes   Which pharmacy would you like this sent to?    [facility] [place], [place], [place] [address] P: [phone]  Patient notified that their request is being sent to the clinical staff for review and that they should receive a response within 2 business days.   Please advise at Mobile [phone] (mobile)

## 2022-09-23 ENCOUNTER — Telehealth: Payer: Self-pay | Admitting: Family Medicine

## 2022-09-23 ENCOUNTER — Other Ambulatory Visit: Payer: Self-pay

## 2022-09-23 ENCOUNTER — Other Ambulatory Visit: Payer: Self-pay | Admitting: Family Medicine

## 2022-09-23 MED ORDER — METHYLPHENIDATE HCL 20 MG PO TABS: 20.0000 mg | ORAL_TABLET | Freq: Three times a day (TID) | ORAL | 0 refills | Status: DC

## 2022-09-23 NOTE — Telephone Encounter (Signed)
Patient said her pharmacy has not received prescription for methylphenidate (RITALIN) 20 MG tablet. Patient said prescription should have been sent to [facility] on [address]. Please cancel order at [facility] on [address] and send to the [place] location

## 2022-09-24 ENCOUNTER — Other Ambulatory Visit: Payer: Self-pay

## 2022-09-25 ENCOUNTER — Other Ambulatory Visit: Payer: Self-pay

## 2022-09-25 NOTE — Telephone Encounter (Signed)
Called pharmacy Rx was canceled

## 2022-09-26 ENCOUNTER — Other Ambulatory Visit: Payer: PRIVATE HEALTH INSURANCE

## 2022-09-30 ENCOUNTER — Other Ambulatory Visit: Payer: Self-pay | Admitting: Family Medicine

## 2022-09-30 MED ORDER — METHYLPHENIDATE HCL 20 MG PO TABS: 20.0000 mg | ORAL_TABLET | Freq: Three times a day (TID) | ORAL | 0 refills | Status: DC

## 2022-10-01 NOTE — Telephone Encounter (Signed)
Called pt lvm and sent mychart message  Letting pt know she need to call to make appt.

## 2022-10-10 ENCOUNTER — Other Ambulatory Visit: Payer: PRIVATE HEALTH INSURANCE

## 2022-10-10 ENCOUNTER — Telehealth: Payer: Self-pay | Admitting: Family Medicine

## 2022-10-10 NOTE — Telephone Encounter (Signed)
Pt had appt for UDS and was advised at  The last refill request will need lab appt. Sent pt mychart  message to make lab before we able to refill.

## 2022-10-10 NOTE — Telephone Encounter (Signed)
Pt is requesting a refill, scheduled for labs today.   Medication:  diazepam (VALIUM) 10 MG tablet  Has the patient contacted their pharmacy? No.   Preferred Pharmacy:   [facility] [address] ([address]), [place] - [place] [id number] [place] ([place]) [place] [address] Phone: [phone]  Fax: [phone]

## 2022-10-14 ENCOUNTER — Telehealth: Payer: Self-pay | Admitting: Family Medicine

## 2022-10-14 ENCOUNTER — Other Ambulatory Visit: Payer: Self-pay

## 2022-10-14 DIAGNOSIS — N920 Excessive and frequent menstruation with regular cycle: Secondary | ICD-10-CM

## 2022-10-14 NOTE — Telephone Encounter (Signed)
Pt needs new referral for obgyn. Please call to advise.

## 2022-10-14 NOTE — Telephone Encounter (Signed)
Referral sent 

## 2022-10-15 ENCOUNTER — Other Ambulatory Visit: Payer: PRIVATE HEALTH INSURANCE

## 2022-10-23 ENCOUNTER — Other Ambulatory Visit: Payer: Self-pay | Admitting: Family Medicine

## 2022-10-23 ENCOUNTER — Other Ambulatory Visit: Payer: PRIVATE HEALTH INSURANCE

## 2022-10-23 DIAGNOSIS — Z79899 Other long term (current) drug therapy: Secondary | ICD-10-CM

## 2022-10-25 ENCOUNTER — Other Ambulatory Visit: Payer: Self-pay | Admitting: Family Medicine

## 2022-10-25 LAB — DM TEMPLATE

## 2022-10-26 LAB — DRUG MONITORING PANEL 376104, URINE
Alphahydroxyalprazolam: NEGATIVE ng/mL (ref <25)
Alphahydroxymidazolam: NEGATIVE ng/mL (ref <50)
Alphahydroxytriazolam: NEGATIVE ng/mL (ref <50)
Aminoclonazepam: NEGATIVE ng/mL (ref <25)
Amphetamines: NEGATIVE ng/mL (ref <500)
Barbiturates: NEGATIVE ng/mL (ref <300)
Benzodiazepines: POSITIVE ng/mL — AB (ref <100)
Cocaine Metabolite: NEGATIVE ng/mL (ref <150)
Desmethyltramadol: NEGATIVE ng/mL (ref <100)
Hydroxyethylflurazepam: NEGATIVE ng/mL (ref <50)
Lorazepam: NEGATIVE ng/mL (ref <50)
Nordiazepam: 977 ng/mL — ABNORMAL HIGH (ref <50)
Opiates: NEGATIVE ng/mL (ref <100)
Oxazepam: 1665 ng/mL — ABNORMAL HIGH (ref <50)
Oxycodone: NEGATIVE ng/mL (ref <100)
Temazepam: 2952 ng/mL — ABNORMAL HIGH (ref <50)
Tramadol: NEGATIVE ng/mL (ref <100)

## 2022-10-26 LAB — DM TEMPLATE

## 2022-10-27 MED ORDER — METHYLPHENIDATE HCL 20 MG PO TABS: 20.0000 mg | ORAL_TABLET | Freq: Three times a day (TID) | ORAL | 0 refills | Status: DC

## 2022-10-27 NOTE — Telephone Encounter (Signed)
Requesting: Ritalin '20mg'$   Contract: 06/17/21 UDS: 10/23/22 Last Visit: 09/15/22 w/ Lovena Le Next Visit: None Last Refill: 09/30/22 #90 and 0RF   Please Advise

## 2022-11-22 ENCOUNTER — Other Ambulatory Visit: Payer: Self-pay | Admitting: Family Medicine

## 2022-11-24 MED ORDER — METHYLPHENIDATE HCL 20 MG PO TABS: 20.0000 mg | ORAL_TABLET | Freq: Three times a day (TID) | ORAL | 0 refills | Status: DC

## 2022-11-24 NOTE — Telephone Encounter (Signed)
Requesting: methylphenidate 20mg   Contract: 06/17/21 UDS: 10/23/22 Last Visit: 09/15/22 w/ Lovena Le Next Visit: 11/27/22 Last Refill: 10/27/22 #90 and 0RF   Please Advise

## 2022-11-27 ENCOUNTER — Telehealth (INDEPENDENT_AMBULATORY_CARE_PROVIDER_SITE_OTHER): Payer: PRIVATE HEALTH INSURANCE | Admitting: Family Medicine

## 2022-11-27 DIAGNOSIS — F411 Generalized anxiety disorder: Secondary | ICD-10-CM | POA: Diagnosis not present

## 2022-11-27 DIAGNOSIS — F909 Attention-deficit hyperactivity disorder, unspecified type: Secondary | ICD-10-CM

## 2022-11-27 DIAGNOSIS — E282 Polycystic ovarian syndrome: Secondary | ICD-10-CM

## 2022-11-27 DIAGNOSIS — M549 Dorsalgia, unspecified: Secondary | ICD-10-CM

## 2022-11-27 DIAGNOSIS — E559 Vitamin D deficiency, unspecified: Secondary | ICD-10-CM | POA: Diagnosis not present

## 2022-11-27 DIAGNOSIS — Z30011 Encounter for initial prescription of contraceptive pills: Secondary | ICD-10-CM

## 2022-11-27 MED ORDER — NORGESTIMATE-ETH ESTRADIOL 0.25-35 MG-MCG PO TABS: 1.0000 | ORAL_TABLET | Freq: Every day | ORAL | 1 refills | Status: DC

## 2022-11-27 MED ORDER — GABAPENTIN 300 MG PO CAPS: 300.0000 mg | ORAL_CAPSULE | Freq: Three times a day (TID) | ORAL | 1 refills | Status: DC

## 2022-11-27 MED ORDER — CLONAZEPAM 1 MG PO TABS: 1.0000 mg | ORAL_TABLET | Freq: Three times a day (TID) | ORAL | 1 refills | Status: DC | PRN
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
## 2022-11-30 ENCOUNTER — Encounter: Payer: Self-pay | Admitting: Family Medicine

## 2022-11-30 DIAGNOSIS — M549 Dorsalgia, unspecified: Secondary | ICD-10-CM | POA: Insufficient documentation

## 2022-11-30 NOTE — Assessment & Plan Note (Signed)
Allowed refill on ocp and referral to OB as she got a new phone number and has not been in con tact with them yet.
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
## 2022-11-30 NOTE — Assessment & Plan Note (Signed)
Supplement and monitor 

## 2022-11-30 NOTE — Assessment & Plan Note (Signed)
Continue Methylphenidate 20 mg tid

## 2022-11-30 NOTE — Assessment & Plan Note (Signed)
She feels her anxiety continues to need treatment but Diazepam causes sedation will try switching to Clonazepam to see if she tolerates. Continue Buspar for now

## 2022-12-01 ENCOUNTER — Telehealth: Payer: PRIVATE HEALTH INSURANCE | Admitting: Family Medicine

## 2022-12-01 ENCOUNTER — Telehealth: Payer: Self-pay

## 2022-12-01 NOTE — Telephone Encounter (Signed)
Done

## 2022-12-22 ENCOUNTER — Other Ambulatory Visit: Payer: Self-pay | Admitting: Family Medicine

## 2022-12-22 MED ORDER — METHYLPHENIDATE HCL 20 MG PO TABS: 20.0000 mg | ORAL_TABLET | Freq: Three times a day (TID) | ORAL | 0 refills | Status: DC

## 2022-12-22 NOTE — Telephone Encounter (Signed)
Requesting: methylphenidate   Contract: 06/17/21 UDS: 10/23/22 Last Visit: 11/27/22 Next Visit: None Last Refill: 11/24/22 #90 and 0RF  Please Advise

## 2022-12-24 ENCOUNTER — Other Ambulatory Visit: Payer: Self-pay | Admitting: Family Medicine

## 2022-12-24 MED ORDER — CLONAZEPAM 1 MG PO TABS: 1.0000 mg | ORAL_TABLET | Freq: Three times a day (TID) | ORAL | 1 refills | Status: DC | PRN

## 2022-12-29 NOTE — Telephone Encounter (Signed)
Called pt and sent mychart message regarding  Needing appt to discuss Klonopin.

## 2023-01-12 ENCOUNTER — Telehealth: Payer: PRIVATE HEALTH INSURANCE | Admitting: Physician Assistant

## 2023-01-12 DIAGNOSIS — R110 Nausea: Secondary | ICD-10-CM

## 2023-01-12 MED ORDER — PROMETHAZINE HCL 25 MG PO TABS: 25.0000 mg | ORAL_TABLET | Freq: Three times a day (TID) | ORAL | 0 refills | Status: DC | PRN

## 2023-01-12 NOTE — Progress Notes (Signed)
E-Visit for Nausea and Vomiting   We are sorry that you are not feeling well. Here is how we plan to help!  Based on what you have shared with me it looks like you have a Virus that is irritating your GI tract.  Vomiting is the forceful emptying of a portion of the stomach's content through the mouth.  Although nausea and vomiting can make you feel miserable, it's important to remember that these are not diseases, but rather symptoms of an underlying illness.  When we treat short term symptoms, we always caution that any symptoms that persist should be fully evaluated in a medical office.  I have prescribed a medication that will help alleviate your symptoms and allow you to stay hydrated:  Promethazine 25 mg take 1 tablet twice daily  HOME CARE: Drink clear liquids.  This is very important! Dehydration (the lack of fluid) can lead to a serious complication.  Start off with 1 tablespoon every 5 minutes for 8 hours. You may begin eating bland foods after 8 hours without vomiting.  Start with saltine crackers, white bread, rice, mashed potatoes, applesauce. After 48 hours on a bland diet, you may resume a normal diet. Try to go to sleep.  Sleep often empties the stomach and relieves the need to vomit.  GET HELP RIGHT AWAY IF:  Your symptoms do not improve or worsen within 2 days after treatment. You have a fever for over 3 days. You cannot keep down fluids after trying the medication.  MAKE SURE YOU:  Understand these instructions. Will watch your condition. Will get help right away if you are not doing well or get worse.    Thank you for choosing an e-visit.  Your e-visit answers were reviewed by a board certified advanced clinical practitioner to complete your personal care plan. Depending upon the condition, your plan could have included both over the counter or prescription medications.  Please review your pharmacy choice. Make sure the pharmacy is open so you can pick up  prescription now. If there is a problem, you may contact your provider through MyChart messaging and have the prescription routed to another pharmacy.  Your safety is important to us. If you have drug allergies check your prescription carefully.   For the next 24 hours you can use MyChart to ask questions about today's visit, request a non-urgent call back, or ask for a work or school excuse. You will get an email in the next two days asking about your experience. I hope that your e-visit has been valuable and will speed your recovery.  I have spent 5 minutes in review of e-visit questionnaire, review and updating patient chart, medical decision making and response to patient.   Audi Wettstein M Nixie Laube, PA-C  

## 2023-01-20 ENCOUNTER — Other Ambulatory Visit: Payer: Self-pay | Admitting: Family Medicine

## 2023-01-21 ENCOUNTER — Other Ambulatory Visit: Payer: Self-pay | Admitting: Family Medicine

## 2023-01-21 MED ORDER — CLONAZEPAM 1 MG PO TABS: 1.0000 mg | ORAL_TABLET | Freq: Two times a day (BID) | ORAL | 0 refills | Status: DC | PRN

## 2023-01-21 MED ORDER — METHYLPHENIDATE HCL 10 MG PO TABS: 10.0000 mg | ORAL_TABLET | Freq: Every day | ORAL | 0 refills | Status: DC | PRN

## 2023-01-21 NOTE — Telephone Encounter (Signed)
Pt called stating that she wanted to look into at least a 30 day supply so that she can have some time to look into a in-network provider or different insurance that Dr. Abner Greenspan is considered in-network for. After speaking with [name], advised pt that a message would have to be sent back to our clinical manager to look into this.

## 2023-01-21 NOTE — Telephone Encounter (Signed)
Spoke with patient regarding insurance, visits and medications.  Patient states she has not changed insurance yet because she has a lot going on.  Advised to update insurance ASAP to continue to be a [place] patient.  Patient is requesting refill for Klonopin to get her through until she gets new insurance. States she takes 1.5 tablet every night with "stress gummies".  Explained this may not be approved but would ask PCP. If approved, would like sent to [facility] on [address].   Also, Methylphenidate that was refilled today needs to be sent to [facility] on [address] d/t supply issues.  PCP will need to send RX d/t controlled substance.   Please advise on refills.

## 2023-01-21 NOTE — Telephone Encounter (Signed)
Requesting: Ritalin 10mg   Contract: 06/17/21 UDS: 10/23/22 Last Visit: 11/27/22 Next Visit: None Last Refill: 07/21/22 #30 and 0RF   Please Advise

## 2023-01-28 ENCOUNTER — Telehealth: Payer: Self-pay | Admitting: Family Medicine

## 2023-01-28 ENCOUNTER — Other Ambulatory Visit: Payer: Self-pay | Admitting: Family Medicine

## 2023-01-28 MED ORDER — METHYLPHENIDATE HCL 20 MG PO TABS: 20.0000 mg | ORAL_TABLET | Freq: Three times a day (TID) | ORAL | 0 refills | Status: DC

## 2023-01-28 NOTE — Telephone Encounter (Signed)
Sent pt message medication has been sent

## 2023-01-28 NOTE — Telephone Encounter (Signed)
Pt called stating that her ritalin Rx was written incorrectly. Pt stated that it was written for once a day when it should have been written for three times daily.

## 2023-03-02 ENCOUNTER — Other Ambulatory Visit: Payer: Self-pay | Admitting: Family Medicine

## 2023-03-02 DIAGNOSIS — F411 Generalized anxiety disorder: Secondary | ICD-10-CM

## 2023-03-02 MED ORDER — BUSPIRONE HCL 15 MG PO TABS: 15.0000 mg | ORAL_TABLET | Freq: Three times a day (TID) | ORAL | 0 refills | Status: DC

## 2023-03-02 MED ORDER — CLONAZEPAM 1 MG PO TABS: 1.0000 mg | ORAL_TABLET | Freq: Two times a day (BID) | ORAL | 0 refills | Status: DC | PRN

## 2023-03-02 MED ORDER — METHYLPHENIDATE HCL 20 MG PO TABS: 20.0000 mg | ORAL_TABLET | Freq: Three times a day (TID) | ORAL | 0 refills | Status: DC

## 2023-03-02 NOTE — Telephone Encounter (Signed)
Requesting: ritalin 20mg   Contract: 07/16/21 UDS: 10/23/22 Last Visit: 11/27/22 Next Visit: None Last Refill: 01/28/23 #90 and 0RF   Please Advise

## 2023-03-02 NOTE — Telephone Encounter (Signed)
Requesting: clonazepam 1mg  Contract: 07/16/21 UDS: 10/23/22 Last Visit: 11/27/22 Next Visit: None Last Refill: 01/21/23 #40 and 0RF   Please Advise

## 2023-04-03 ENCOUNTER — Other Ambulatory Visit: Payer: Self-pay | Admitting: Family Medicine

## 2023-04-03 DIAGNOSIS — F411 Generalized anxiety disorder: Secondary | ICD-10-CM

## 2023-04-03 MED ORDER — BUSPIRONE HCL 15 MG PO TABS: 15.0000 mg | ORAL_TABLET | Freq: Three times a day (TID) | ORAL | 0 refills | Status: DC

## 2023-04-03 MED ORDER — METHYLPHENIDATE HCL 20 MG PO TABS: 20.0000 mg | ORAL_TABLET | Freq: Three times a day (TID) | ORAL | 0 refills | Status: DC

## 2023-04-03 MED ORDER — CLONAZEPAM 1 MG PO TABS: 1.0000 mg | ORAL_TABLET | Freq: Two times a day (BID) | ORAL | 0 refills | Status: DC | PRN

## 2023-04-03 NOTE — Telephone Encounter (Signed)
Requesting: clonazepam 1mg , Ritalin 20mg   Contract: 07/16/21 UDS: 10/23/22 Last Visit: 11/27/22 Next Visit: None Last Refill: 03/02/23 #90 and 0RF and 40 and 0RF  Please Advise

## 2023-08-17 ENCOUNTER — Ambulatory Visit: Payer: No Typology Code available for payment source | Admitting: Internal Medicine

## 2023-08-17 NOTE — Assessment & Plan Note (Signed)
Juggles numerous stressors.

## 2023-08-17 NOTE — Progress Notes (Unsigned)
CC: Establishment of care  HPI:  Melissa [name] is a 32 y.o. female with a past medical history of *** and presenting to the clinic today for ***. Please see problem based assessment and plan for additional details.  Past Medical History:  Diagnosis Date   Abnormal Pap smear of cervix 07/11/2011   Acid reflux    Anxiety    Asthma    activity induced   Irregular menses    Polycystic ovarian syndrome      Current Outpatient Medications (Endocrine & Metabolic):    norgestimate-ethinyl estradiol (ORTHO-CYCLEN) 0.25-35 MG-MCG tablet, Take 1 tablet by mouth daily.   predniSONE (DELTASONE) 10 MG tablet, Take 2 tablets (20 mg total) by mouth 2 (two) times daily with a meal.   Current Outpatient Medications (Respiratory):    albuterol (PROVENTIL) (5 MG/ML) 0.5% nebulizer solution, Take 0.5 mLs (2.5 mg total) by nebulization every 6 (six) hours as needed for wheezing or shortness of breath.   albuterol (VENTOLIN HFA) 108 (90 Base) MCG/ACT inhaler, Inhale 2 puffs into the lungs every 6 (six) hours as needed for wheezing or shortness of breath.   promethazine (PHENERGAN) 25 MG tablet, Take 1 tablet (25 mg total) by mouth every 8 (eight) hours as needed for nausea or vomiting.    Current Outpatient Medications (Other):    busPIRone (BUSPAR) 15 MG tablet, Take 1 tablet (15 mg total) by mouth 3 (three) times daily.   clonazePAM (KLONOPIN) 1 MG tablet, Take 1 tablet (1 mg total) by mouth 2 (two) times daily as needed for anxiety.   gabapentin (NEURONTIN) 300 MG capsule, Take 1 capsule (300 mg total) by mouth 3 (three) times daily.   lidocaine (LIDODERM) 5 %, Place 1 patch onto the skin daily. Remove & Discard patch within 12 hours or as directed by MD   methylphenidate (RITALIN) 20 MG tablet, Take 1 tablet (20 mg total) by mouth 3 (three) times daily with meals. August 2024 rx   ondansetron (ZOFRAN) 4 MG tablet, Take 1 tablet (4 mg total) by mouth every 8 (eight) hours as needed for nausea or  vomiting.   ondansetron (ZOFRAN-ODT) 8 MG disintegrating tablet, Take 1 tablet (8 mg total) by mouth every 8 (eight) hours as needed for nausea or vomiting.   Vitamin D, Ergocalciferol, (DRISDOL) 1.25 MG (50000 UNIT) CAPS capsule, Take 1 capsule (50,000 Units total) by mouth every 7 (seven) days.  @Allergies @  Family History  Problem Relation Age of Onset   Gallbladder disease Mother    Colon polyps Mother    Hypertension Father    Other Father        Hepatitis A   Diabetes Father    Drug abuse Father    Post-traumatic stress disorder Father    Stomach cancer Maternal Grandmother    Diabetes Maternal Grandmother    Colon polyps Maternal Grandmother    Colon cancer Maternal Grandmother    Cancer Maternal Grandmother        stomach cancer   Alcohol abuse Maternal Grandmother    Emphysema Maternal Grandfather    Cancer Maternal Grandfather        smoke, lung cancer   Alcohol abuse Maternal Grandfather    Heart disease Paternal Grandfather    Alcohol abuse Paternal Grandfather    Breast cancer Other        mat great aunts x 2   Colon cancer Other        mat great uncles x 2   Irritable bowel syndrome  Sister    Multiple sclerosis Sister    Bipolar disorder Sister    Colon polyps Paternal Uncle    Alcohol abuse Paternal Grandmother    Pancreatic cancer Neg Hx    Esophageal cancer Neg Hx     Social History: ***  Review of Systems: ROS negative except for what is noted on the assessment and plan.  There were no vitals filed for this visit.   Physical Exam: General: Well appearing ***, NAD HENT: normocephalic, atraumatic EYES: conjunctiva non-erythematous, no scleral icterus CV: regular rate, normal rhythm, no murmurs, rubs, gallops. Pulmonary: normal work of breathing on RA, lungs clear to auscultation, no rales, wheezes, rhonchi Abdominal: non-distended, soft, non-tender to palpation, normal BS Skin: Warm and dry, no rashes or lesions Neurological: MS: awake, alert  and oriented x3, normal speech and fund of knowledge Motor: moves all extremities antigravity Psych: normal affect    Assessment & Plan:   No problem-specific Assessment & Plan notes found for this encounter.   See Encounters Tab for problem based charting.  Patient discussed with Dr. {NAMES:[id number]::"Melissa [name]","Melissa [name]","Melissa [name]","Melissa [name]","Melissa [name]","Melissa [name]","Melissa [name]","Melissa [name]"} Melissa Abbot, MD Melissa [name]. [facility] Internal Medicine Residency, PGY-3

## 2023-08-17 NOTE — Assessment & Plan Note (Signed)
Tolerating Ritalin

## 2023-08-18 ENCOUNTER — Telehealth: Payer: Self-pay

## 2023-08-18 ENCOUNTER — Telehealth (INDEPENDENT_AMBULATORY_CARE_PROVIDER_SITE_OTHER): Payer: No Typology Code available for payment source | Admitting: Family Medicine

## 2023-08-18 VITALS — BP 135/90 | HR 84 | Temp 97.3°F

## 2023-08-18 DIAGNOSIS — F909 Attention-deficit hyperactivity disorder, unspecified type: Secondary | ICD-10-CM | POA: Diagnosis not present

## 2023-08-18 DIAGNOSIS — F411 Generalized anxiety disorder: Secondary | ICD-10-CM | POA: Diagnosis not present

## 2023-08-18 DIAGNOSIS — F419 Anxiety disorder, unspecified: Secondary | ICD-10-CM

## 2023-08-18 DIAGNOSIS — F32A Depression, unspecified: Secondary | ICD-10-CM

## 2023-08-18 MED ORDER — GABAPENTIN 300 MG PO CAPS: 300.0000 mg | ORAL_CAPSULE | Freq: Three times a day (TID) | ORAL | 1 refills | Status: DC

## 2023-08-18 MED ORDER — LISDEXAMFETAMINE DIMESYLATE 50 MG PO CAPS: 50.0000 mg | ORAL_CAPSULE | Freq: Every day | ORAL | 0 refills | Status: DC

## 2023-08-18 MED ORDER — METHYLPHENIDATE HCL 10 MG PO TABS: 10.0000 mg | ORAL_TABLET | Freq: Every day | ORAL | 0 refills | Status: DC | PRN

## 2023-08-18 MED ORDER — CLONAZEPAM 1 MG PO TABS: 1.0000 mg | ORAL_TABLET | Freq: Two times a day (BID) | ORAL | 1 refills | Status: DC | PRN

## 2023-08-18 MED ORDER — BUSPIRONE HCL 15 MG PO TABS: 15.0000 mg | ORAL_TABLET | Freq: Three times a day (TID) | ORAL | 0 refills | Status: DC

## 2023-08-18 MED ORDER — PAROXETINE HCL 10 MG PO TABS: ORAL_TABLET | ORAL | 2 refills | Status: DC

## 2023-08-18 MED ORDER — BUSPIRONE HCL 30 MG PO TABS: 30.0000 mg | ORAL_TABLET | Freq: Two times a day (BID) | ORAL | 2 refills | Status: DC

## 2023-08-18 MED ORDER — HYDROXYZINE PAMOATE 25 MG PO CAPS: 25.0000 mg | ORAL_CAPSULE | Freq: Three times a day (TID) | ORAL | Status: DC | PRN

## 2023-08-18 NOTE — Telephone Encounter (Signed)
Denial letter received, [employer]) requires OV note telling them why Pt is concurrently taking a benzodiazepine and methylphenidate. OV note can be faxed to them at [phone] or provider can call Pharmacy Dept at [phone] to discuss with case reviewer. Member id: [id number]   They will also need to know why Pt is unable to take a higher dose of methylphenidate to achieve better ADHD control vs having to take methylphenidate w/ Vyvanse.

## 2023-08-18 NOTE — Telephone Encounter (Signed)
PA initiated via Covermymeds; KEY: [id number]. Awaiting determination.

## 2023-08-18 NOTE — Telephone Encounter (Signed)
PA denied. Awaiting denial information.  °

## 2023-08-18 NOTE — Telephone Encounter (Signed)
PA initiated via Covermymeds; KEY: [id number]. Awaiting determination.

## 2023-08-18 NOTE — Telephone Encounter (Signed)
Denial letter received, [employer]) requires OV note telling them why Pt is concurrently taking a benzodiazepine and methylphenidate. OV note can be faxed to them at [phone] or provider can call Pharmacy Dept at [phone] to discuss with case reviewer. Member id: [id number]

## 2023-08-19 ENCOUNTER — Other Ambulatory Visit: Payer: Self-pay | Admitting: Family Medicine

## 2023-08-19 ENCOUNTER — Encounter: Payer: Self-pay | Admitting: *Deleted

## 2023-08-19 ENCOUNTER — Encounter: Payer: Self-pay | Admitting: Family Medicine

## 2023-08-19 ENCOUNTER — Telehealth: Payer: Self-pay | Admitting: *Deleted

## 2023-08-19 MED ORDER — LISDEXAMFETAMINE DIMESYLATE 50 MG PO CAPS: 50.0000 mg | ORAL_CAPSULE | Freq: Every day | ORAL | 0 refills | Status: DC

## 2023-08-19 NOTE — Telephone Encounter (Signed)
Patient need to schedule appointment for in person appointment in 4 months with Dr. Abner Greenspan.  Left message on machine to call back to schedule.

## 2023-08-23 NOTE — Progress Notes (Signed)
MyChart Video Visit    Virtual Visit via Video Note   This patient is at least at moderate risk for complications without adequate follow up. This format is felt to be most appropriate for this patient at this time. Physical exam was limited by quality of the video and audio technology used for the visit. [name], CMA was able to get the patient set up on a video visit.  Patient location: home Patient and provider in visit Provider location: Office  I discussed the limitations of evaluation and management by telemedicine and the availability of in person appointments. The patient expressed understanding and agreed to proceed.  Visit Date: 08/18/2023  Today's healthcare provider: Danise Edge, MD     Subjective:    Patient ID: Melissa [name], female    DOB: 06-25-1991, 32 y.o.   MRN: [id number]  Chief Complaint  Patient presents with   Follow-up    Medication Management    HPI Discussed the use of AI scribe software for clinical note transcription with the patient, who gave verbal consent to proceed.  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 They report struggling with depression, anxiety, and overeating. They are currently taking Ritalin three times a day, but are considering switching back to Vyvanse due to its benefits for overeating. They also take gabapentin three times a day and Buspar three times a day for anxiety. They have been prescribed hydroxyzine as needed, but report rarely using it. They also take paroxetine twice a day and clonazepam as needed. The patient reports working long shifts and feeling tired, which they attribute to their medication. They express a desire to adjust their medication regimen to better manage their symptoms and improve their quality of life.        Past Medical History:  Diagnosis Date   Abnormal Pap smear of cervix 07/11/2011   Acid reflux    Anxiety    Asthma     activity induced   Irregular menses    Polycystic ovarian syndrome     Past Surgical History:  Procedure Laterality Date   NO PAST SURGERIES      Family History  Problem Relation Age of Onset   Gallbladder disease Mother    Colon polyps Mother    Hypertension Father    Other Father        Hepatitis A   Diabetes Father    Drug abuse Father    Post-traumatic stress disorder Father    Stomach cancer Maternal Grandmother    Diabetes Maternal Grandmother    Colon polyps Maternal Grandmother    Colon cancer Maternal Grandmother    Cancer Maternal Grandmother        stomach cancer   Alcohol abuse Maternal Grandmother    Emphysema Maternal Grandfather    Cancer Maternal Grandfather        smoke, lung cancer   Alcohol abuse Maternal Grandfather    Heart disease Paternal Grandfather    Alcohol abuse Paternal Grandfather    Breast cancer Other        mat great aunts x 2   Colon cancer Other        mat great uncles x 2   Irritable bowel syndrome Sister    Multiple sclerosis Sister    Bipolar disorder Sister    Colon polyps Paternal Uncle    Alcohol abuse Paternal Grandmother    Pancreatic cancer Neg Hx  Esophageal cancer Neg Hx     Social History   Socioeconomic History   Marital status: Married    Spouse name: Not on file   Number of children: 0   Years of education: Not on file   Highest education level: Associate degree: occupational, Scientist, product/process development, or vocational program  Occupational History   Occupation: Dentist: [employer]   Tobacco Use   Smoking status: Never   Smokeless tobacco: Never  Vaping Use   Vaping status: Never Used  Substance and Sexual Activity   Alcohol use: Not Currently    Alcohol/week: 4.0 - 7.0 standard drinks of alcohol    Types: 4 - 7 Cans of beer per week    Comment: 1 beer every other ay   Drug use: Not Currently    Types: Marijuana   Sexual activity: Yes    Partners: Male    Birth control/protection: None   Other Topics Concern   Not on file  Social History Narrative   Caffeine use:  1 can soda daily   Regular exercise:  Just started   2 sisters.      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    Social Drivers of Health   Financial Resource Strain: High Risk (08/17/2023)   Overall Financial Resource Strain (CARDIA)    Difficulty of Paying Living Expenses: Hard  Food Insecurity: Food Insecurity Present (08/17/2023)   Hunger Vital Sign    Worried About Running Out of Food in the Last Year: Sometimes true    Ran Out of Food in the Last Year: Sometimes true  Transportation Needs: Unmet Transportation Needs (08/17/2023)   PRAPARE - Administrator, Civil Service (Medical): Yes    Lack of Transportation (Non-Medical): Yes  Physical Activity: Unknown (08/17/2023)   Exercise Vital Sign    Days of Exercise per Week: 0 days    Minutes of Exercise per Session: Not on file  Stress: Stress Concern Present (08/17/2023)   [employer] of Occupational Health - Occupational Stress Questionnaire    Feeling of Stress : Very much  Social Connections: Moderately Integrated (08/17/2023)   Social Connection and Isolation Panel [NHANES]    Frequency of Communication with Friends and Family: More than three times a week    Frequency of Social Gatherings with Friends and Family: Never    Attends Religious Services: More than 4 times per year    Active Member of [employer] or Organizations: No    Attends Engineer, structural: Not on file    Marital Status: Married  Catering manager Violence: Not At Risk (06/21/2022)   Humiliation, Afraid, Rape, and Kick questionnaire    Fear of Current or Ex-Partner: No    Emotionally Abused: No    Physically Abused: No    Sexually Abused: No    Outpatient Medications Prior to Visit  Medication Sig  Dispense Refill   albuterol (PROVENTIL) (5 MG/ML) 0.5% nebulizer solution Take 0.5 mLs (2.5 mg total) by nebulization every 6 (six) hours as needed for wheezing or shortness of breath. 20 mL 0   albuterol (VENTOLIN HFA) 108 (90 Base) MCG/ACT inhaler Inhale 2 puffs into the lungs every 6 (six) hours as needed for wheezing or shortness of breath. 8 g 1   norgestimate-ethinyl estradiol (ORTHO-CYCLEN) 0.25-35 MG-MCG  tablet Take 1 tablet by mouth daily. 84 tablet 1   ondansetron (ZOFRAN) 4 MG tablet Take 1 tablet (4 mg total) by mouth every 8 (eight) hours as needed for nausea or vomiting. 20 tablet 0   ondansetron (ZOFRAN-ODT) 8 MG disintegrating tablet Take 1 tablet (8 mg total) by mouth every 8 (eight) hours as needed for nausea or vomiting. 20 tablet 0   Vitamin D, Ergocalciferol, (DRISDOL) 1.25 MG (50000 UNIT) CAPS capsule Take 1 capsule (50,000 Units total) by mouth every 7 (seven) days. 4 capsule 4   busPIRone (BUSPAR) 15 MG tablet Take 1 tablet (15 mg total) by mouth 3 (three) times daily. 90 tablet 0   clonazePAM (KLONOPIN) 1 MG tablet Take 1 tablet (1 mg total) by mouth 2 (two) times daily as needed for anxiety. 40 tablet 0   gabapentin (NEURONTIN) 300 MG capsule Take 1 capsule (300 mg total) by mouth 3 (three) times daily. 90 capsule 1   methylphenidate (RITALIN) 20 MG tablet Take 1 tablet (20 mg total) by mouth 3 (three) times daily with meals. August 2024 rx 90 tablet 0   lidocaine (LIDODERM) 5 % Place 1 patch onto the skin daily. Remove & Discard patch within 12 hours or as directed by MD 30 patch 0   predniSONE (DELTASONE) 10 MG tablet Take 2 tablets (20 mg total) by mouth 2 (two) times daily with a meal. 20 tablet 0   promethazine (PHENERGAN) 25 MG tablet Take 1 tablet (25 mg total) by mouth every 8 (eight) hours as needed for nausea or vomiting. 20 tablet 0   No facility-administered medications prior to visit.    Allergies  Allergen Reactions   Diphenhydramine Hcl Other (See  Comments)    Review of Systems  Constitutional:  Positive for malaise/fatigue. Negative for fever.  HENT:  Negative for congestion.   Eyes:  Negative for blurred vision.  Respiratory:  Negative for shortness of breath.   Cardiovascular:  Negative for chest pain, palpitations and leg swelling.  Gastrointestinal:  Negative for abdominal pain, blood in stool and nausea.  Genitourinary:  Negative for dysuria and frequency.  Musculoskeletal:  Negative for falls.  Skin:  Negative for rash.  Neurological:  Negative for dizziness, loss of consciousness and headaches.  Endo/Heme/Allergies:  Negative for environmental allergies.  Psychiatric/Behavioral:  Positive for depression. Negative for substance abuse and suicidal ideas. The patient is nervous/anxious and has insomnia.        Objective:    Physical Exam Constitutional:      General: She is not in acute distress.    Appearance: Normal appearance. She is not ill-appearing or toxic-appearing.  HENT:     Head: Normocephalic and atraumatic.     Right Ear: External ear normal.     Left Ear: External ear normal.     Nose: Nose normal.  Eyes:     General:        Right eye: No discharge.        Left eye: No discharge.  Pulmonary:     Effort: Pulmonary effort is normal.  Skin:    Findings: No rash.  Neurological:     Mental Status: She is alert and oriented to person, place, and time.  Psychiatric:        Behavior: Behavior normal.     BP (!) 135/90 Comment: Pt stated  Pulse 84 Comment: Just had coffee (with 3 shots of espresso)  Temp (!) 97.3 F (36.3 C) (Tympanic)   SpO2 98%  Wt Readings from Last 3 Encounters:  06/25/22 219 lb (99.3 kg)  06/19/22 214 lb (97.1 kg)  06/18/22 214 lb (97.1 kg)       Assessment & Plan:  Adult ADHD Assessment & Plan: Tolerating Ritalin   Anxiety and depression Assessment & Plan: Juggles numerous stressors.    GAD (generalized anxiety disorder) -     busPIRone HCl; Take 1 tablet (15  mg total) by mouth 3 (three) times daily.  Dispense: 90 tablet; Refill: 0  Other orders -     Methylphenidate HCl; Take 1 tablet (10 mg total) by mouth daily as needed.  Dispense: 30 tablet; Refill: 0 -     busPIRone HCl; Take 1 tablet (30 mg total) by mouth 2 (two) times daily.  Dispense: 60 tablet; Refill: 2 -     PARoxetine HCl; 20 mg in am po and 10 mg in pm  Dispense: 90 tablet; Refill: 2 -     hydrOXYzine Pamoate; Take 1 capsule (25 mg total) by mouth every 8 (eight) hours as needed. -     Gabapentin; Take 1 capsule (300 mg total) by mouth 3 (three) times daily.  Dispense: 90 capsule; Refill: 1 -     clonazePAM; Take 1 tablet (1 mg total) by mouth 2 (two) times daily as needed for anxiety.  Dispense: 40 tablet; Refill: 1     Assessment and Plan    ADHD Currently on Ritalin 60mg  daily, considering switch to Vyvanse due to cost and overeating associated with depression. -Start Vyvanse 50mg  daily and Ritalin 10mg  as needed. -Check for side effects such as palpitations, chest pain, anxiety attacks, and insomnia. -Plan to increase Vyvanse to 60mg  daily if tolerated and effective.  Anxiety Currently on Buspar 30mg  three times daily and gabapentin 300mg  three times daily. -Continue Buspar 30mg  twice daily and gabapentin 300mg  three times daily. -Use hydroxyzine 25mg  as needed for stress and anxiety, but not in combination with clonazepam due to sedation risk.  Insomnia Currently on clonazepam as needed. -Continue clonazepam as needed, but not in combination with hydroxyzine due to sedation risk.  Depression Currently on paroxetine 20mg  in the morning and 10mg  in the afternoon. -Change to paroxetine 10mg  tablets, two in the morning and one in the afternoon.  Follow-up in 4 months. Patient to notify provider if current medication regimen is effective for refills.         I discussed the assessment and treatment plan with the patient. The patient was provided an opportunity to ask  questions and all were answered. The patient agreed with the plan and demonstrated an understanding of the instructions.   The patient was advised to call back or seek an in-person evaluation if the symptoms worsen or if the condition fails to improve as anticipated.  Danise Edge, MD [facility] at [facility] [phone] (phone) [phone] (fax)  [facility]

## 2023-08-25 NOTE — Telephone Encounter (Signed)
Running out of time to appeal. Do we want to appeal?

## 2023-08-25 NOTE — Telephone Encounter (Signed)
Called patient. No answer. LVM

## 2023-08-27 NOTE — Telephone Encounter (Signed)
Spoke with patient. She went to another pharmacy and paid cash for it

## 2023-09-12 ENCOUNTER — Other Ambulatory Visit: Payer: Self-pay | Admitting: Family Medicine

## 2023-09-12 MED ORDER — LIDOCAINE 5 % EX PTCH: 1.0000 | MEDICATED_PATCH | CUTANEOUS | 0 refills | Status: DC

## 2023-09-12 MED ORDER — METHYLPHENIDATE HCL 20 MG PO TABS: 20.0000 mg | ORAL_TABLET | Freq: Two times a day (BID) | ORAL | 0 refills | Status: DC

## 2023-09-14 ENCOUNTER — Telehealth: Payer: Self-pay

## 2023-09-14 NOTE — Telephone Encounter (Signed)
 PA initiated via Covermymeds; KEY: [id number]. Awaiting determination.

## 2023-09-15 ENCOUNTER — Other Ambulatory Visit: Payer: Self-pay | Admitting: Family

## 2023-09-15 MED ORDER — METHYLPHENIDATE HCL 20 MG PO TABS: 20.0000 mg | ORAL_TABLET | Freq: Two times a day (BID) | ORAL | 0 refills | Status: DC

## 2023-09-15 NOTE — Telephone Encounter (Signed)
 Spoke w/ [facility] at [facility]- cancelled Ritalin prescription. Dr. Abner Greenspan- can you send to [facility] on [address]. Thank you.

## 2023-09-15 NOTE — Telephone Encounter (Signed)
 PA denied. Awaiting denial information.

## 2023-09-16 ENCOUNTER — Other Ambulatory Visit: Payer: Self-pay | Admitting: Family Medicine

## 2023-09-16 DIAGNOSIS — M79671 Pain in right foot: Secondary | ICD-10-CM

## 2023-09-18 ENCOUNTER — Other Ambulatory Visit: Payer: Self-pay | Admitting: Family

## 2023-09-18 ENCOUNTER — Telehealth: Payer: Self-pay | Admitting: Family Medicine

## 2023-09-18 MED ORDER — METHYLPHENIDATE HCL 20 MG PO TABS: 20.0000 mg | ORAL_TABLET | Freq: Two times a day (BID) | ORAL | 0 refills | Status: DC

## 2023-09-18 NOTE — Telephone Encounter (Unsigned)
Copied from CRM [id number]. Topic: Clinical - Prescription Issue >> Sep 17, 2023  4:40 PM Sim Boast F wrote: Reason for CRM: Patient says methylphenidate (RITALIN) 20 MG tablet needs to be 3 times a day instead of 2 times a day also requested for Med to be sent to [id number] - [place], [place] - [address]

## 2023-09-18 NOTE — Telephone Encounter (Signed)
Prescription Request  09/18/2023  LOV: 12.17.24  What is the name of the medication or equipment? methylphenidate (RITALIN) 20 MG tablet [[id number]]   Have you contacted your pharmacy to request a refill? Yes   Which pharmacy would you like this sent to?  [id number] [place], [place] - [address] [place] [address] Phone: [phone] Fax: [phone]   Patient notified that their request is being sent to the clinical staff for review and that they should receive a response within 2 business days.   Please advise at Mobile [phone] (mobile)

## 2023-09-21 NOTE — Telephone Encounter (Signed)
PA denied. Medicaid only covers for diabetic neuropathy or post herpetic neuralgia

## 2023-09-21 NOTE — Telephone Encounter (Signed)
[name], Melissa L3 days ago   CR Copied from [employer] [id number] [phone]. Topic: Clinical - Prescription Issue >> Sep 17, 2023  4:40 PM Sim Boast F wrote: Reason for CRM: Patient says methylphenidate (RITALIN) 20 MG tablet needs to be 3 times a day instead of 2 times a day also requested for Med to be [facility] to [id number] - [place], [place] - [address]

## 2023-09-22 NOTE — Telephone Encounter (Signed)
Called patient to advice of message below from Dr. Abner Greenspan message and no answer LVM to return call.  Let patient know that her insurance will not cover the Lidocaine 5% she can do the 4% OTC

## 2023-09-22 NOTE — Telephone Encounter (Signed)
 Called patient. Instructions given per provider's note
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
## 2023-10-01 ENCOUNTER — Telehealth: Payer: No Typology Code available for payment source | Admitting: Physician Assistant

## 2023-10-01 DIAGNOSIS — B9689 Other specified bacterial agents as the cause of diseases classified elsewhere: Secondary | ICD-10-CM

## 2023-10-01 DIAGNOSIS — N760 Acute vaginitis: Secondary | ICD-10-CM | POA: Diagnosis not present

## 2023-10-01 MED ORDER — METRONIDAZOLE 500 MG PO TABS: 500.0000 mg | ORAL_TABLET | Freq: Two times a day (BID) | ORAL | 0 refills | Status: DC

## 2023-10-01 MED ORDER — FLUCONAZOLE 150 MG PO TABS: ORAL_TABLET | ORAL | 0 refills | Status: DC

## 2023-10-01 NOTE — Progress Notes (Signed)
Virtual Visit Consent   Melissa [name], you are scheduled for a virtual visit with a [facility] provider today. Just as with appointments in the office, your consent must be obtained to participate. Your consent will be active for this visit and any virtual visit you may have with one of our providers in the next 365 days. If you have a MyChart account, a copy of this consent can be sent to you electronically.  As this is a virtual visit, video technology does not allow for your provider to perform a traditional examination. This may limit your provider's ability to fully assess your condition. If your provider identifies any concerns that need to be evaluated in person or the need to arrange testing (such as labs, EKG, etc.), we will make arrangements to do so. Although advances in technology are sophisticated, we cannot ensure that it will always work on either your end or our end. If the connection with a video visit is poor, the visit may have to be switched to a telephone visit. With either a video or telephone visit, we are not always able to ensure that we have a secure connection.  By engaging in this virtual visit, you consent to the provision of healthcare and authorize for your insurance to be billed (if applicable) for the services provided during this visit. Depending on your insurance coverage, you may receive a charge related to this service.  I need to obtain your verbal consent now. Are you willing to proceed with your visit today? Melissa [name] has provided verbal consent on 10/01/2023 for a virtual visit (video or telephone). Piedad Climes, [place]  Date: 10/01/2023 12:31 PM  Virtual Visit via Video Note   I, Piedad Climes, connected with  Melissa [name]  ([id number], Oct 05, 1990) on 10/01/23 at 12:15 PM EST by a video-enabled telemedicine application and verified that I am speaking with the correct person using two identifiers.  Location: Patient: Virtual Visit Location  Patient: Home Provider: Virtual Visit Location Provider: Home Office   I discussed the limitations of evaluation and management by telemedicine and the availability of in person appointments. The patient expressed understanding and agreed to proceed.    History of Present Illness: Melissa [name] is a 32 y.o. who identifies as a female who was assigned female at birth, and is being seen today for possible bacterial vaginosis. Patient endorses history of PCOS and irregular periods. Had slight episodes of spotting over the past week. Following this, noting vaginal itching and discharge that with musty odor.   HPI: HPI  Problems:  Patient Active Problem List   Diagnosis Date Noted   Back pain 11/30/2022   Cough 07/23/2022   Diarrhea 06/25/2022   Community acquired pneumonia of left lung 06/25/2022   Acute vaginitis 06/25/2022   Allergic reaction 06/25/2022   Hyperglycemia 04/14/2022   Hyperlipidemia 04/14/2022   Menstrual bleeding problem 06/17/2021   Chronic vaginitis 06/17/2021   Menometrorrhagia 02/10/2021   Hypocalcemia 02/10/2021   Elevated CK 02/10/2021   Reflux gastritis 02/10/2021   Atypical chest pain 02/10/2021   Muscle spasm 02/10/2021   Vitamin D deficiency 02/10/2021   Musculoskeletal pain 01/11/2021   Female infertility, primary 05/22/2020   GAD (generalized anxiety disorder) 03/12/2020   Panic disorder 03/12/2020   Adult ADHD 03/12/2020   Tachycardia 12/16/2019   Cervical cancer screening 11/14/2019   Preventative health care 11/14/2019   Anxiety and depression 10/23/2019   History of COVID-19 09/25/2019   Morbid obesity (HCC) 01/31/2016  Hip abductor tendinitis 01/23/2015   Acanthosis nigricans 09/12/2014   Snoring 09/12/2014   Oligo-ovulation 07/04/2014   Nausea with vomiting 11/21/2013   Sinusitis 11/21/2013   Nevus 02/11/2013   GERD (gastroesophageal reflux disease) 02/11/2013   PCOS (polycystic ovarian syndrome) 07/11/2011   Abnormal Pap smear of  cervix 07/11/2011   Depression 07/11/2011   Asthma 07/11/2011   Weight gain 07/11/2011    Allergies:  Allergies  Allergen Reactions   Diphenhydramine Hcl Other (See Comments)   Medications:  Current Outpatient Medications:    fluconazole (DIFLUCAN) 150 MG tablet, Take 1 tablet PO once. Repeat in 3 days if needed., Disp: 2 tablet, Rfl: 0   metroNIDAZOLE (FLAGYL) 500 MG tablet, Take 1 tablet (500 mg total) by mouth 2 (two) times daily., Disp: 14 tablet, Rfl: 0   albuterol (PROVENTIL) (5 MG/ML) 0.5% nebulizer solution, Take 0.5 mLs (2.5 mg total) by nebulization every 6 (six) hours as needed for wheezing or shortness of breath., Disp: 20 mL, Rfl: 0   albuterol (VENTOLIN HFA) 108 (90 Base) MCG/ACT inhaler, Inhale 2 puffs into the lungs every 6 (six) hours as needed for wheezing or shortness of breath., Disp: 8 g, Rfl: 1   busPIRone (BUSPAR) 15 MG tablet, Take 1 tablet (15 mg total) by mouth 3 (three) times daily., Disp: 90 tablet, Rfl: 0   busPIRone (BUSPAR) 30 MG tablet, Take 1 tablet (30 mg total) by mouth 2 (two) times daily., Disp: 60 tablet, Rfl: 2   clonazePAM (KLONOPIN) 1 MG tablet, Take 1 tablet (1 mg total) by mouth 2 (two) times daily as needed for anxiety., Disp: 40 tablet, Rfl: 1   gabapentin (NEURONTIN) 300 MG capsule, Take 1 capsule (300 mg total) by mouth 3 (three) times daily., Disp: 90 capsule, Rfl: 1   hydrOXYzine (VISTARIL) 25 MG capsule, Take 1 capsule (25 mg total) by mouth every 8 (eight) hours as needed., Disp: , Rfl:    methylphenidate (RITALIN) 20 MG tablet, Take 1 tablet (20 mg total) by mouth 2 (two) times daily., Disp: 60 tablet, Rfl: 0   norgestimate-ethinyl estradiol (ORTHO-CYCLEN) 0.25-35 MG-MCG tablet, Take 1 tablet by mouth daily., Disp: 84 tablet, Rfl: 1   ondansetron (ZOFRAN) 4 MG tablet, Take 1 tablet (4 mg total) by mouth every 8 (eight) hours as needed for nausea or vomiting., Disp: 20 tablet, Rfl: 0   ondansetron (ZOFRAN-ODT) 8 MG disintegrating tablet, Take  1 tablet (8 mg total) by mouth every 8 (eight) hours as needed for nausea or vomiting., Disp: 20 tablet, Rfl: 0   PARoxetine (PAXIL) 10 MG tablet, 20 mg in am po and 10 mg in pm, Disp: 90 tablet, Rfl: 2   Vitamin D, Ergocalciferol, (DRISDOL) 1.25 MG (50000 UNIT) CAPS capsule, Take 1 capsule (50,000 Units total) by mouth every 7 (seven) days., Disp: 4 capsule, Rfl: 4  Observations/Objective: Patient is well-developed, well-nourished in no acute distress.  Resting comfortably at home.  Head is normocephalic, atraumatic.  No labored breathing. Speech is clear and coherent with logical content.  Patient is alert and oriented at baseline.   Assessment and Plan: 1. Bacterial vaginosis (Primary) - metroNIDAZOLE (FLAGYL) 500 MG tablet; Take 1 tablet (500 mg total) by mouth 2 (two) times daily.  Dispense: 14 tablet; Refill: 0  Will start Flagyl per orders. Supportive measures and OTC medications discussed. Will add on Diflucan for concomitant yeast infection. In person follow-up discussed.   Follow Up Instructions: I discussed the assessment and treatment plan with the patient. The patient  was provided an opportunity to ask questions and all were answered. The patient agreed with the plan and demonstrated an understanding of the instructions.  A copy of instructions were sent to the patient via MyChart unless otherwise noted below.   The patient was advised to call back or seek an in-person evaluation if the symptoms worsen or if the condition fails to improve as anticipated.    Piedad Climes, PA-C

## 2023-10-01 NOTE — Patient Instructions (Signed)
Melissa [name], thank you for joining Piedad Climes, PA-C for today's virtual visit.  While this provider is not your primary care provider (PCP), if your PCP is located in our provider database this encounter information will be shared with them immediately following your visit.   [facility] account gives you access to today's visit and all your visits, tests, and labs performed at [facility] " click here if you don't have a [facility] MyChart account or go to [email or web address]  Consent: (Patient) Melissa [name] provided verbal consent for this virtual visit at the beginning of the encounter.  Current Medications:  Current Outpatient Medications:    albuterol (PROVENTIL) (5 MG/ML) 0.5% nebulizer solution, Take 0.5 mLs (2.5 mg total) by nebulization every 6 (six) hours as needed for wheezing or shortness of breath., Disp: 20 mL, Rfl: 0   albuterol (VENTOLIN HFA) 108 (90 Base) MCG/ACT inhaler, Inhale 2 puffs into the lungs every 6 (six) hours as needed for wheezing or shortness of breath., Disp: 8 g, Rfl: 1   busPIRone (BUSPAR) 15 MG tablet, Take 1 tablet (15 mg total) by mouth 3 (three) times daily., Disp: 90 tablet, Rfl: 0   busPIRone (BUSPAR) 30 MG tablet, Take 1 tablet (30 mg total) by mouth 2 (two) times daily., Disp: 60 tablet, Rfl: 2   clonazePAM (KLONOPIN) 1 MG tablet, Take 1 tablet (1 mg total) by mouth 2 (two) times daily as needed for anxiety., Disp: 40 tablet, Rfl: 1   gabapentin (NEURONTIN) 300 MG capsule, Take 1 capsule (300 mg total) by mouth 3 (three) times daily., Disp: 90 capsule, Rfl: 1   hydrOXYzine (VISTARIL) 25 MG capsule, Take 1 capsule (25 mg total) by mouth every 8 (eight) hours as needed., Disp: , Rfl:    lidocaine (LIDODERM) 5 %, Place 1 patch onto the skin daily. Remove & Discard patch within 12 hours or as directed by MD, Disp: 30 patch, Rfl: 0   methylphenidate (RITALIN) 20 MG tablet, Take 1 tablet (20 mg total) by mouth 2 (two)  times daily., Disp: 60 tablet, Rfl: 0   norgestimate-ethinyl estradiol (ORTHO-CYCLEN) 0.25-35 MG-MCG tablet, Take 1 tablet by mouth daily., Disp: 84 tablet, Rfl: 1   ondansetron (ZOFRAN) 4 MG tablet, Take 1 tablet (4 mg total) by mouth every 8 (eight) hours as needed for nausea or vomiting., Disp: 20 tablet, Rfl: 0   ondansetron (ZOFRAN-ODT) 8 MG disintegrating tablet, Take 1 tablet (8 mg total) by mouth every 8 (eight) hours as needed for nausea or vomiting., Disp: 20 tablet, Rfl: 0   PARoxetine (PAXIL) 10 MG tablet, 20 mg in am po and 10 mg in pm, Disp: 90 tablet, Rfl: 2   Vitamin D, Ergocalciferol, (DRISDOL) 1.25 MG (50000 UNIT) CAPS capsule, Take 1 capsule (50,000 Units total) by mouth every 7 (seven) days., Disp: 4 capsule, Rfl: 4   Medications ordered in this encounter:  No orders of the defined types were placed in this encounter.    *If you need refills on other medications prior to your next appointment, please contact your pharmacy*  Follow-Up: Call back or seek an in-person evaluation if the symptoms worsen or if the condition fails to improve as anticipated.  [facility] [phone]  Other Instructions Vaginal Infection (Bacterial Vaginosis): What to Know  Bacterial vaginosis is an infection of the vagina. It happens when the balance of normal germs (bacteria) in the vagina changes. If you don't get treated, it can make it easier for  you to get other infections from sex. These are called sexually transmitted infections (STIs). If you're pregnant, you need to get treated right away. This infection can cause a baby to be born early or at a low birth weight. What are the causes? This infection happens when too many harmful germs grow in the vagina. You can't get this infection from toilet seats, bedsheets, swimming pools, or things that touch your vagina. What increases the risk? Having sex with a new person or more than one person. Having sex without  protection. Douching. Having an intrauterine device (IUD). Smoking. Using drugs or drinking alcohol. These can lead you to do risky things. Taking certain antibiotics. Being pregnant. What are the signs or symptoms? Some females have no symptoms. Symptoms may include: A gray or white discharge from your vagina. It can be watery or foamy. A fishy smell. This can happen after sex or during your menstrual period. Itching in and around your vagina. Burning or pain when you pee. How is this treated? This infection is treated with antibiotics. These may be given to you as: A pill. A cream for your vagina. A medicine that you put into your vagina (suppository). If the infection comes back, you may need more antibiotics. Follow these instructions at home: Medicines Take your medicines as told. Take or use your antibiotics as told. Do not stop using them even if you start to feel better. General instructions If the person you have sex with is a female, tell her that you have this infection. She will need to follow up with her doctor. Female partners don't need to be treated. Do not have sex until you finish treatment. Drink more fluids as told. Keep your vagina and butt clean. Wash these areas with warm water each day. Wipe from front to back after you poop. If you're breastfeeding a baby, talk to your doctor if you should keep doing so during treatment. How is this prevented? Self-care Do not douche. Do not use deodorant sprays on your vagina. Wear cotton underwear. Do not wear tight pants and pantyhose, especially in the summer. Safe sex Use condoms the correct way and every time you have sex. Use dental dams to protect yourself during oral sex. Limit how many people you have sex with. Get tested for STIs. The person you have sex with should also get tested. Drugs and alcohol Do not smoke, vape, or use nicotine or tobacco. Do not use drugs. Limit the amount of alcohol you drink  because it can lead you to do risky things. Where to find more information To learn more: Go to TonerPromos.no. Click Health Topics A-Z. Type "bacterial vaginosis" in the search bar. American Sexual Health Association (ASHA): ashasexualhealth.org U.S. Department of Health and [employer], Office on Women's Health: TravelLesson.ca Contact a doctor if: Your symptoms don't get better, even after treatment. You have more discharge or pain when you pee. You have a fever or chills. You have pain in your belly or in the area between your hips. You have pain during sex. You bleed from your vagina between menstrual periods. This information is not intended to replace advice given to you by your health care provider. Make sure you discuss any questions you have with your health care provider. Document Revised: 02/04/2023 Document Reviewed: 02/04/2023 Elsevier Patient Education  2024 Elsevier Inc.   If you have been instructed to have an in-person evaluation today at a local Urgent Care facility, please use the link below. It will take  you to a list of all of our available [facility], including address, phone number and hours of operation. Please do not delay care.  [facility]  If you or a family member do not have a primary care provider, use the link below to schedule a visit and establish care. When you choose a [facility] primary care physician or advanced practice provider, you gain a long-term partner in health. Find a Primary Care Provider  Learn more about [facility] in-office and virtual care options: [facility] - Get Care Now

## 2023-10-03 ENCOUNTER — Telehealth: Payer: No Typology Code available for payment source | Admitting: Family Medicine

## 2023-10-03 DIAGNOSIS — T7840XA Allergy, unspecified, initial encounter: Secondary | ICD-10-CM

## 2023-10-03 DIAGNOSIS — N760 Acute vaginitis: Secondary | ICD-10-CM

## 2023-10-03 MED ORDER — CLINDAMYCIN HCL 300 MG PO CAPS: 300.0000 mg | ORAL_CAPSULE | Freq: Two times a day (BID) | ORAL | 0 refills | Status: AC

## 2023-10-03 NOTE — Progress Notes (Signed)
Virtual Visit Consent   Melissa [name], you are scheduled for a virtual visit with a  provider today. Just as with appointments in the office, your consent must be obtained to participate. Your consent will be active for this visit and any virtual visit you may have with one of our providers in the next 365 days. If you have a MyChart account, a copy of this consent can be sent to you electronically.  As this is a virtual visit, video technology does not allow for your provider to perform a traditional examination. This may limit your provider's ability to fully assess your condition. If your provider identifies any concerns that need to be evaluated in person or the need to arrange testing (such as labs, EKG, etc.), we will make arrangements to do so. Although advances in technology are sophisticated, we cannot ensure that it will always work on either your end or our end. If the connection with a video visit is poor, the visit may have to be switched to a telephone visit. With either a video or telephone visit, we are not always able to ensure that we have a secure connection.  By engaging in this virtual visit, you consent to the provision of healthcare and authorize for your insurance to be billed (if applicable) for the services provided during this visit. Depending on your insurance coverage, you may receive a charge related to this service.  I need to obtain your verbal consent now. Are you willing to proceed with your visit today? Melissa [name] has provided verbal consent on 10/03/2023 for a virtual visit (video or telephone). Georgana Curio, FNP  Date: 10/03/2023 12:37 PM  Virtual Visit via Video Note   I, Georgana Curio, connected with  Melissa [name]  ([id number], 11/19/1990) on 10/03/23 at 12:30 PM EST by a video-enabled telemedicine application and verified that I am speaking with the correct person using two identifiers.  Location: Patient: Virtual Visit Location Patient:  Home Provider: Virtual Visit Location Provider: Home Office   I discussed the limitations of evaluation and management by telemedicine and the availability of in person appointments. The patient expressed understanding and agreed to proceed.    History of Present Illness: Melissa [name] is a 33 y.o. who identifies as a female who was assigned female at birth, and is being seen today for allergic reaction to flagyl. She had swelling in lips and hands with itching and tingling. This happened thurs night after taking flagyl. She has taken diflucan in the past without problems. She has been taking benadryl. She needs something else for the BV.Marland Kitchen  HPI: HPI  Problems:  Patient Active Problem List   Diagnosis Date Noted   Back pain 11/30/2022   Cough 07/23/2022   Diarrhea 06/25/2022   Community acquired pneumonia of left lung 06/25/2022   Acute vaginitis 06/25/2022   Allergic reaction 06/25/2022   Hyperglycemia 04/14/2022   Hyperlipidemia 04/14/2022   Menstrual bleeding problem 06/17/2021   Chronic vaginitis 06/17/2021   Menometrorrhagia 02/10/2021   Hypocalcemia 02/10/2021   Elevated CK 02/10/2021   Reflux gastritis 02/10/2021   Atypical chest pain 02/10/2021   Muscle spasm 02/10/2021   Vitamin D deficiency 02/10/2021   Musculoskeletal pain 01/11/2021   Female infertility, primary 05/22/2020   GAD (generalized anxiety disorder) 03/12/2020   Panic disorder 03/12/2020   Adult ADHD 03/12/2020   Tachycardia 12/16/2019   Cervical cancer screening 11/14/2019   Preventative health care 11/14/2019   Anxiety and depression 10/23/2019   History of COVID-19  09/25/2019   Morbid obesity (HCC) 01/31/2016   Hip abductor tendinitis 01/23/2015   Acanthosis nigricans 09/12/2014   Snoring 09/12/2014   Oligo-ovulation 07/04/2014   Nausea with vomiting 11/21/2013   Sinusitis 11/21/2013   Nevus 02/11/2013   GERD (gastroesophageal reflux disease) 02/11/2013   PCOS (polycystic ovarian syndrome)  07/11/2011   Abnormal Pap smear of cervix 07/11/2011   Depression 07/11/2011   Asthma 07/11/2011   Weight gain 07/11/2011    Allergies:  Allergies  Allergen Reactions   Diphenhydramine Hcl Other (See Comments)   Medications:  Current Outpatient Medications:    albuterol (PROVENTIL) (5 MG/ML) 0.5% nebulizer solution, Take 0.5 mLs (2.5 mg total) by nebulization every 6 (six) hours as needed for wheezing or shortness of breath., Disp: 20 mL, Rfl: 0   albuterol (VENTOLIN HFA) 108 (90 Base) MCG/ACT inhaler, Inhale 2 puffs into the lungs every 6 (six) hours as needed for wheezing or shortness of breath., Disp: 8 g, Rfl: 1   busPIRone (BUSPAR) 15 MG tablet, Take 1 tablet (15 mg total) by mouth 3 (three) times daily., Disp: 90 tablet, Rfl: 0   busPIRone (BUSPAR) 30 MG tablet, Take 1 tablet (30 mg total) by mouth 2 (two) times daily., Disp: 60 tablet, Rfl: 2   clonazePAM (KLONOPIN) 1 MG tablet, Take 1 tablet (1 mg total) by mouth 2 (two) times daily as needed for anxiety., Disp: 40 tablet, Rfl: 1   fluconazole (DIFLUCAN) 150 MG tablet, Take 1 tablet PO once. Repeat in 3 days if needed., Disp: 2 tablet, Rfl: 0   gabapentin (NEURONTIN) 300 MG capsule, Take 1 capsule (300 mg total) by mouth 3 (three) times daily., Disp: 90 capsule, Rfl: 1   hydrOXYzine (VISTARIL) 25 MG capsule, Take 1 capsule (25 mg total) by mouth every 8 (eight) hours as needed., Disp: , Rfl:    methylphenidate (RITALIN) 20 MG tablet, Take 1 tablet (20 mg total) by mouth 2 (two) times daily., Disp: 60 tablet, Rfl: 0   metroNIDAZOLE (FLAGYL) 500 MG tablet, Take 1 tablet (500 mg total) by mouth 2 (two) times daily., Disp: 14 tablet, Rfl: 0   norgestimate-ethinyl estradiol (ORTHO-CYCLEN) 0.25-35 MG-MCG tablet, Take 1 tablet by mouth daily., Disp: 84 tablet, Rfl: 1   ondansetron (ZOFRAN) 4 MG tablet, Take 1 tablet (4 mg total) by mouth every 8 (eight) hours as needed for nausea or vomiting., Disp: 20 tablet, Rfl: 0   ondansetron  (ZOFRAN-ODT) 8 MG disintegrating tablet, Take 1 tablet (8 mg total) by mouth every 8 (eight) hours as needed for nausea or vomiting., Disp: 20 tablet, Rfl: 0   PARoxetine (PAXIL) 10 MG tablet, 20 mg in am po and 10 mg in pm, Disp: 90 tablet, Rfl: 2   Vitamin D, Ergocalciferol, (DRISDOL) 1.25 MG (50000 UNIT) CAPS capsule, Take 1 capsule (50,000 Units total) by mouth every 7 (seven) days., Disp: 4 capsule, Rfl: 4  Observations/Objective: Patient is well-developed, well-nourished in no acute distress.  Resting comfortably  at home.  Head is normocephalic, atraumatic.  No labored breathing.  Speech is clear and coherent with logical content.  Patient is alert and oriented at baseline.    Assessment and Plan: 1. Bacterial vaginosis (Primary)  2. Allergic reaction, initial encounter  Increase fluids, stop flagyl, continue benadryl, allergy added to chart, prednisone for 5 days. UC if sx worsen.    Follow Up Instructions: I discussed the assessment and treatment plan with the patient. The patient was provided an opportunity to ask questions and all were  answered. The patient agreed with the plan and demonstrated an understanding of the instructions.  A copy of instructions were sent to the patient via MyChart unless otherwise noted below.     The patient was advised to call back or seek an in-person evaluation if the symptoms worsen or if the condition fails to improve as anticipated.    Georgana Curio, FNP

## 2023-10-03 NOTE — Patient Instructions (Signed)
Anaphylactic Reaction, Adult An anaphylactic reaction (anaphylaxis) is a sudden and severe allergic reaction. It must be treated right away. You will need to go to the emergency room. What are the causes? This type of reaction happens when you are exposed to something you are allergic to (allergen). Your body makes antibodies to fight the allergen. It also makes a compound called histamine. This causes parts of your body to swell. It can also cause loss of blood pressure to areas like the heart and lungs. Allergens that can cause this type of reaction include: Foods, such as peanuts, wheat, shellfish, milk, or eggs. Medicines. Insect bites or stings. Blood or parts of blood received for treatment (transfusions). Chemicals. These include dyes, latex, and the contrast material used for medical tests. What increases the risk? You are more likely to have this kind of reaction if: You have allergies. You have had an anaphylactic reaction before. You have a family history of anaphylactic reaction. You have certain conditions. These include asthma and eczema. What are the signs or symptoms? Symptoms of this condition may include: Feeling warm in the face (flushed). Your face may turn red. Hives. These are itchy, red, swollen areas of skin. Swelling of the eyes, lips, face, mouth, tongue, or throat. Trouble breathing, speaking, or swallowing. Making high-pitched whistling sounds when you breathe, most often when you breathe out (wheezing). Feeling dizzy or light-headed, or fainting. Pain or cramping in the abdomen. Vomiting or diarrhea. How is this diagnosed? This condition is diagnosed based on: Your symptoms. A physical exam. Blood tests. When you were last exposed to an allergen. How is this treated? If you think you are having an anaphylactic reaction, you should: Give yourself an emergency shot of epinephrine using a device filled with medicine (auto-injector pen). Your health care  provider will teach you how to use the auto-injector pen. Call for help. If you use an auto-injector pen, you must still get treated in the hospital. You may be given: Medicines to help: Tighten your blood vessels (epinephrine). Relieve itching and hives (antihistamines). Reduce swelling (corticosteroids). Oxygen therapy to help you breathe. IV fluids to keep enough water in your body. Follow these instructions at home: Safety Always keep an auto-injector pen near you. Use it as told by your provider. Do not drive after you have an anaphylactic reaction. Wait until your provider says that you can. Make sure that you, the people in your household, and your employer know: What you are allergic to. How to use an auto-injector pen. Replace the epinephrine right after you use your auto-injector pen. If you can, carry two pens. If told by your provider, wear an alert bracelet or necklace that states your allergy. Learn the signs of anaphylactic reaction. Work with your health care team to make a plan for how to handle an anaphylactic reaction. General instructions Take over-the-counter and prescription medicines only as told by your provider. If you have hives or a rash: Use an over-the-counter allergy medicine (antihistamine) as told by your provider. Apply cold, wet cloths (cold compresses) to your skin. Take baths or showers in cool water. Do not use hot water. Tell your team that you have an allergy. How is this prevented? Avoid allergens. When you are at a restaurant, tell your server that you have an allergy. If you are not sure if a menu item contains a food that you are allergic to, ask your server. Where to find more information American Academy of Allergy, Asthma, and Immunology (AAAAI): aaaai.org  Contact a health care provider if: Your auto-injector pen has expired. Get help right away if: You have symptoms of an allergic reaction. You use an auto-injector pen. You will need  more medical care even if the medicine seems to be working. An anaphylactic reaction may happen again within 72 hours (rebound anaphylaxis). These symptoms may be an emergency. Use your auto-injector pen right away. Then call 911. Do not wait to see if the symptoms will go away. Do not drive yourself to the hospital. This information is not intended to replace advice given to you by your health care provider. Make sure you discuss any questions you have with your health care provider. Document Revised: 05/23/2022 Document Reviewed: 05/02/2022 Elsevier Patient Education  2024 [employer].

## 2023-10-05 ENCOUNTER — Ambulatory Visit: Payer: Self-pay | Admitting: Family Medicine

## 2023-10-05 NOTE — Telephone Encounter (Signed)
Pt reports she was seen via video visit for allergic reaction, she was advised Prednisone would be sent to pharmacy. Pt notes the swelling to her lip/hand have reduced, she continues with the vomiting, no new symptoms noted. Pt requesting script be sent to [facility], pharmacy updated in chart. Routing to provider for review.  Copied from CRM [id number]. Topic: Clinical - Red Word Triage >> Oct 05, 2023  1:13 PM Drema Balzarine wrote: Red Word that prompted transfer to Nurse Triage: Patient says she had 2 telehealth visits and her meds were never sent to pharmacy - she's having an allergic reaction, vomiting, swollen lip.hand and itchiness. Reason for Disposition  [1] Prescription not at pharmacy AND [2] was prescribed by PCP recently (Exception: [facility] has access to EMR and prescription is recorded there. Go to Home Care and confirm for pharmacy.)  Answer Assessment - Initial Assessment Questions 1. NAME of MEDICINE: "What medicine(s) are you calling about?"     Prednison 2. QUESTION: "What is your question?" (e.g., double dose of medicine, side effect)     Pt requesting script be sent per last virtual visit  4. SYMPTOMS: "Do you have any symptoms?" If Yes, ask: "What symptoms are you having?"  "How bad are the symptoms (e.g., mild, moderate, severe)     Swelling to hand/lip have improved, continues with vomiting, no new symptoms  Protocols used: Medication Question Call-A-AH

## 2023-10-05 NOTE — Telephone Encounter (Signed)
  Chief Complaint: lip swelling that has not improved, worsening UTI Symptoms: incontinence, wrinkly lips Frequency: 4 days Pertinent Negatives: Patient denies airway compromise, SOB, tongue swelling,  Disposition: [] ED /[x] Urgent Care (no appt availability in office) / [] Appointment(In office/virtual)/ []  [facility]/ [] Home Care/ [x] Refused Recommended Disposition /[] [facility]/ []  Follow-up with PCP Additional Notes: Pt states she was recently dx with UTI and given abx which caused an allergic rxn. Pt states that was seen for a VV for the allergic rxn and was told she would get an rx for prednisone, pt has not yet received the rx for prednisone. Pt now also requesting zofran and predisone. Pt staes that she is afraid to take the abx clindamycin for the UTI, writer educated on the need for the abx with the UTI. Pt also advised to go to UC d/t lack of improvement with the lip swelling. Pt states that she does not want to go to UC, and there are no available appts today with clinic. Pt is taking benadryl. Pt confirms that she has an itchy throat, but denies SOB nor difficulty breathing.   Reason for Disposition . [1] Mild lip swelling from food reaction AND [2] diagnosis never confirmed by a doctor (or NP/PA)  Answer Assessment - Initial Assessment Questions 1. ONSET: "When did the swelling start?" (e.g., minutes, hours, days)     4 days lip swelling, getting worse 2. SEVERITY: "How swollen is it?"     Not cracking 3. ITCHING: "Is there any itching?" If Yes, ask: "How much?"   (Scale 1-10; mild, moderate or severe)     Yes, severe 4. PAIN: "Is the swelling painful to touch?" If Yes, ask: "How painful is it?"   (Scale 1-10; mild, moderate or severe)     Not in pain, just big and wrinkly 5. CAUSE: "What do you think is causing the lip swelling?"     The abx, started about 20-30 minutes after 6. RECURRENT SYMPTOM: "Have you had lip swelling before?" If Yes, ask: "When was  the last time?" "What happened that time?"     flagyl 7. OTHER SYMPTOMS: "Do you have any other symptoms?" (e.g., toothache)     Vomiting, denies SOB, 8. PREGNANCY: "Is there any chance you are pregnant?" "When was your last menstrual period?"     denies  Protocols used: Lip Swelling-A-AH

## 2023-10-06 ENCOUNTER — Other Ambulatory Visit: Payer: Self-pay | Admitting: Physician Assistant

## 2023-10-06 ENCOUNTER — Encounter: Payer: Self-pay | Admitting: Physician Assistant

## 2023-10-06 MED ORDER — PREDNISONE 20 MG PO TABS: 40.0000 mg | ORAL_TABLET | Freq: Every day | ORAL | 0 refills | Status: DC

## 2023-10-06 NOTE — Telephone Encounter (Signed)
 Called patient. No answer. LVM

## 2023-10-07 ENCOUNTER — Encounter: Payer: Self-pay | Admitting: Emergency Medicine

## 2023-10-07 NOTE — Telephone Encounter (Signed)
 The provider's note was sent to patient via MyChart.

## 2023-10-07 NOTE — Telephone Encounter (Signed)
Left detailed message that she can go to urgent care.  Pt was notified via mychart.

## 2023-10-08 ENCOUNTER — Other Ambulatory Visit: Payer: Self-pay | Admitting: Family

## 2023-10-08 MED ORDER — METHYLPHENIDATE HCL 20 MG PO TABS: 20.0000 mg | ORAL_TABLET | Freq: Three times a day (TID) | ORAL | 0 refills | Status: DC

## 2023-10-13 ENCOUNTER — Other Ambulatory Visit: Payer: Self-pay | Admitting: Family Medicine

## 2023-10-15 NOTE — Telephone Encounter (Signed)
Copied from CRM [id number]. Topic: Clinical - Prescription Issue >> Oct 15, 2023  4:17 PM Fredrich Romans wrote: Reason for CRM: patient called in stating that [facility] is saying that they do not have her prescription for  methylphenidate (RITALIN) 20 MG tablet.I advised patient that it was sent in on 10/08/2023.

## 2023-10-17 ENCOUNTER — Emergency Department (HOSPITAL_BASED_OUTPATIENT_CLINIC_OR_DEPARTMENT_OTHER)
Admission: EM | Admit: 2023-10-17 | Discharge: 2023-10-17 | Disposition: A | Discharge status: Home / Self Care | Payer: No Typology Code available for payment source | Attending: Emergency Medicine | Admitting: Emergency Medicine

## 2023-10-17 ENCOUNTER — Other Ambulatory Visit: Payer: Self-pay

## 2023-10-17 ENCOUNTER — Encounter (HOSPITAL_BASED_OUTPATIENT_CLINIC_OR_DEPARTMENT_OTHER): Payer: Self-pay

## 2023-10-17 DIAGNOSIS — E1365 Other specified diabetes mellitus with hyperglycemia: Secondary | ICD-10-CM | POA: Insufficient documentation

## 2023-10-17 DIAGNOSIS — E139 Other specified diabetes mellitus without complications: Secondary | ICD-10-CM

## 2023-10-17 DIAGNOSIS — Z7984 Long term (current) use of oral hypoglycemic drugs: Secondary | ICD-10-CM | POA: Insufficient documentation

## 2023-10-17 DIAGNOSIS — R739 Hyperglycemia, unspecified: Secondary | ICD-10-CM | POA: Diagnosis present

## 2023-10-17 LAB — CBC WITH DIFFERENTIAL/PLATELET
Abs Immature Granulocytes: 0.03 10*3/uL (ref 0.00–0.07)
Basophils Absolute: 0.1 10*3/uL (ref 0.0–0.1)
Basophils Relative: 1 %
Eosinophils Absolute: 0 10*3/uL (ref 0.0–0.5)
Eosinophils Relative: 0 %
HCT: 42.4 % (ref 36.0–46.0)
Hemoglobin: 13.7 g/dL (ref 12.0–15.0)
Immature Granulocytes: 0 %
Lymphocytes Relative: 40 %
Lymphs Abs: 3.5 10*3/uL (ref 0.7–4.0)
MCH: 25.4 pg — ABNORMAL LOW (ref 26.0–34.0)
MCHC: 32.3 g/dL (ref 30.0–36.0)
MCV: 78.7 fL — ABNORMAL LOW (ref 80.0–100.0)
Monocytes Absolute: 0.7 10*3/uL (ref 0.1–1.0)
Monocytes Relative: 8 %
Neutro Abs: 4.6 10*3/uL (ref 1.7–7.7)
Neutrophils Relative %: 51 %
Platelets: 415 10*3/uL — ABNORMAL HIGH (ref 150–400)
RBC: 5.39 MIL/uL — ABNORMAL HIGH (ref 3.87–5.11)
RDW: 16.9 % — ABNORMAL HIGH (ref 11.5–15.5)
WBC: 9 10*3/uL (ref 4.0–10.5)
nRBC: 0 % (ref 0.0–0.2)

## 2023-10-17 LAB — I-STAT VENOUS BLOOD GAS, ED
Acid-base deficit: 1 mmol/L (ref 0.0–2.0)
Bicarbonate: 23.9 mmol/L (ref 20.0–28.0)
Calcium, Ion: 1.2 mmol/L (ref 1.15–1.40)
HCT: 43 % (ref 36.0–46.0)
Hemoglobin: 14.6 g/dL (ref 12.0–15.0)
O2 Saturation: 97 %
Patient temperature: 98.2
Potassium: 3.6 mmol/L (ref 3.5–5.1)
Sodium: 133 mmol/L — ABNORMAL LOW (ref 135–145)
TCO2: 25 mmol/L (ref 22–32)
pCO2, Ven: 38.3 mm[Hg] — ABNORMAL LOW (ref 44–60)
pH, Ven: 7.401 (ref 7.25–7.43)
pO2, Ven: 86 mm[Hg] — ABNORMAL HIGH (ref 32–45)

## 2023-10-17 LAB — URINALYSIS, ROUTINE W REFLEX MICROSCOPIC
Bilirubin Urine: NEGATIVE
Glucose, UA: >1000 mg/dL — AB
Ketones, ur: 40 mg/dL — AB
Leukocytes,Ua: NEGATIVE
Nitrite: NEGATIVE
Specific Gravity, Urine: 1.044 — ABNORMAL HIGH (ref 1.005–1.030)
pH: 5 (ref 5.0–8.0)

## 2023-10-17 LAB — COMPREHENSIVE METABOLIC PANEL
ALT: 33 U/L (ref 0–44)
AST: 17 U/L (ref 15–41)
Albumin: 4.6 g/dL (ref 3.5–5.0)
Alkaline Phosphatase: 86 U/L (ref 38–126)
Anion gap: 15 (ref 5–15)
BUN: 10 mg/dL (ref 6–20)
CO2: 23 mmol/L (ref 22–32)
Calcium: 8.9 mg/dL (ref 8.9–10.3)
Chloride: 95 mmol/L — ABNORMAL LOW (ref 98–111)
Creatinine, Ser: 0.84 mg/dL (ref 0.44–1.00)
GFR, Estimated: >60 mL/min (ref >60)
Glucose, Bld: 545 mg/dL (ref 70–99)
Potassium: 3.6 mmol/L (ref 3.5–5.1)
Sodium: 133 mmol/L — ABNORMAL LOW (ref 135–145)
Total Bilirubin: 0.6 mg/dL (ref 0.0–1.2)
Total Protein: 7.8 g/dL (ref 6.5–8.1)

## 2023-10-17 LAB — BETA-HYDROXYBUTYRIC ACID: Beta-Hydroxybutyric Acid: 2.45 mmol/L — ABNORMAL HIGH (ref 0.05–0.27)

## 2023-10-17 LAB — CBG MONITORING, ED
Glucose-Capillary: 225 mg/dL — ABNORMAL HIGH (ref 70–99)
Glucose-Capillary: 402 mg/dL — ABNORMAL HIGH (ref 70–99)
Glucose-Capillary: 507 mg/dL (ref 70–99)

## 2023-10-17 LAB — HEMOGLOBIN A1C
Hgb A1c MFr Bld: 12.1 % — ABNORMAL HIGH (ref 4.8–5.6)
Mean Plasma Glucose: 300.57 mg/dL

## 2023-10-17 MED ORDER — ONDANSETRON 4 MG PO TBDP
ORAL_TABLET | ORAL | 0 refills | Status: DC
Start: 2023-10-17 — End: 2023-10-19

## 2023-10-17 MED ORDER — METFORMIN HCL 500 MG PO TABS: 500.0000 mg | ORAL_TABLET | Freq: Two times a day (BID) | ORAL | 1 refills | Status: DC

## 2023-10-17 MED ORDER — INSULIN ASPART 100 UNIT/ML IJ SOLN
5.0000 [IU] | Freq: Once | INTRAMUSCULAR | Status: AC
  Administered 2023-10-17: 5 [IU] via INTRAVENOUS

## 2023-10-17 MED ORDER — ONDANSETRON HCL 4 MG/2ML IJ SOLN
4.0000 mg | Freq: Once | INTRAMUSCULAR | Status: AC
  Administered 2023-10-17: 4 mg via INTRAVENOUS
  Filled 2023-10-17: qty 2

## 2023-10-17 MED ORDER — LACTATED RINGERS IV BOLUS
2000.0000 mL | Freq: Once | INTRAVENOUS | Status: AC
  Administered 2023-10-17: 2000 mL via INTRAVENOUS

## 2023-10-17 MED ORDER — METFORMIN HCL 500 MG PO TABS
500.0000 mg | ORAL_TABLET | Freq: Once | ORAL | Status: AC
  Administered 2023-10-17: 500 mg via ORAL
  Filled 2023-10-17: qty 1

## 2023-10-17 MED ORDER — POTASSIUM CHLORIDE 10 MEQ/100ML IV SOLN
10.0000 meq | Freq: Once | INTRAVENOUS | Status: AC
  Administered 2023-10-17: 10 meq via INTRAVENOUS
  Filled 2023-10-17: qty 100

## 2023-10-17 MED ORDER — LACTATED RINGERS IV BOLUS
1000.0000 mL | Freq: Once | INTRAVENOUS | Status: AC
  Administered 2023-10-17: 1000 mL via INTRAVENOUS

## 2023-10-17 MED ORDER — INSULIN ASPART 100 UNIT/ML IV SOLN
5.0000 [IU] | Freq: Once | INTRAVENOUS | Status: AC
  Administered 2023-10-17: 5 [IU] via INTRAVENOUS

## 2023-10-17 NOTE — ED Notes (Signed)
Pt was provided with snack with her meds.

## 2023-10-17 NOTE — ED Notes (Signed)
Patient denies pain. Pt given d/c paperwork with no complaints. Pt ambulatory on discharge.

## 2023-10-17 NOTE — ED Notes (Signed)
Pt reports she has someone coming to pick her up.

## 2023-10-17 NOTE — ED Provider Notes (Signed)
[place] EMERGENCY DEPARTMENT AT [facility] Provider Note   CSN: [id number] Arrival date & time: 10/17/23  0031     History  Chief Complaint  Patient presents with   Dehydration   Hyperglycemia    Melissa [name] is a 33 y.o. female.  33 year old female with about a months worth of increased thirst increased urination.  She has had blurry vision last couple weeks and some paresthesias in her feet as well.  She states that she just overall feels weak and unwell.  She is unsure why.  She did recently take a course of steroids.  Has had some frequent yeast infections and sounds like.   Hyperglycemia      Home Medications Prior to Admission medications   Medication Sig Start Date End Date Taking? Authorizing Provider  metFORMIN (GLUCOPHAGE) 500 MG tablet Take 1 tablet (500 mg total) by mouth 2 (two) times daily with a meal. 10/17/23  Yes Davi Rotan, Barbara Cower, MD  ondansetron (ZOFRAN-ODT) 4 MG disintegrating tablet 4mg  ODT q4 hours prn nausea/vomit 10/17/23  Yes Temiloluwa Recchia, Barbara Cower, MD  albuterol (PROVENTIL) (5 MG/ML) 0.5% nebulizer solution Take 0.5 mLs (2.5 mg total) by nebulization every 6 (six) hours as needed for wheezing or shortness of breath. 06/25/22   Sandford Craze, NP  albuterol (VENTOLIN HFA) 108 (90 Base) MCG/ACT inhaler Inhale 2 puffs into the lungs every 6 (six) hours as needed for wheezing or shortness of breath. 06/25/22   Sandford Craze, NP  busPIRone (BUSPAR) 15 MG tablet Take 1 tablet (15 mg total) by mouth 3 (three) times daily. 08/18/23   Bradd Canary, MD  busPIRone (BUSPAR) 30 MG tablet Take 1 tablet (30 mg total) by mouth 2 (two) times daily. 08/18/23   Bradd Canary, MD  clonazePAM Scarlette Calico) 1 MG tablet Take 1 tablet by mouth twice daily as needed for anxiety 10/13/23   Bradd Canary, MD  fluconazole (DIFLUCAN) 150 MG tablet Take 1 tablet PO once. Repeat in 3 days if needed. 10/01/23   Waldon Merl, PA-C  gabapentin (NEURONTIN) 300 MG capsule  Take 1 capsule (300 mg total) by mouth 3 (three) times daily. 08/18/23   Bradd Canary, MD  hydrOXYzine (VISTARIL) 25 MG capsule Take 1 capsule (25 mg total) by mouth every 8 (eight) hours as needed. 08/18/23   Bradd Canary, MD  methylphenidate (RITALIN) 20 MG tablet Take 1 tablet (20 mg total) by mouth 3 (three) times daily with meals. 10/08/23   Eulis Foster, FNP  metroNIDAZOLE (FLAGYL) 500 MG tablet Take 1 tablet (500 mg total) by mouth 2 (two) times daily. 10/01/23   Waldon Merl, PA-C  norgestimate-ethinyl estradiol (ORTHO-CYCLEN) 0.25-35 MG-MCG tablet Take 1 tablet by mouth daily. 11/27/22   Bradd Canary, MD  ondansetron (ZOFRAN) 4 MG tablet Take 1 tablet (4 mg total) by mouth every 8 (eight) hours as needed for nausea or vomiting. 09/01/22   Viviano Simas, FNP  PARoxetine (PAXIL) 10 MG tablet 20 mg in am po and 10 mg in pm 08/18/23   Bradd Canary, MD  predniSONE (DELTASONE) 20 MG tablet Take 2 tablets (40 mg total) by mouth daily with breakfast. 10/06/23   Waldon Merl, PA-C  Vitamin D, Ergocalciferol, (DRISDOL) 1.25 MG (50000 UNIT) CAPS capsule Take 1 capsule (50,000 Units total) by mouth every 7 (seven) days. 06/26/22   Bradd Canary, MD      Allergies    Diphenhydramine hcl and Flagyl [metronidazole]    Review of  Systems   Review of Systems  Physical Exam Updated Vital Signs BP 127/71   Pulse 96   Temp 98.5 F (36.9 C) (Oral)   Resp 18   Ht 5\' 10"  (1.778 m)   Wt 99.3 kg   SpO2 97%   BMI 31.41 kg/m  Physical Exam Vitals and nursing note reviewed.  Constitutional:      Appearance: She is well-developed.  HENT:     Head: Normocephalic and atraumatic.     Nose: Nose normal. No congestion or rhinorrhea.  Cardiovascular:     Rate and Rhythm: Regular rhythm. Tachycardia present.  Pulmonary:     Effort: No respiratory distress.     Breath sounds: No stridor.  Abdominal:     General: There is no distension.  Musculoskeletal:        General: No swelling  or tenderness. Normal range of motion.     Cervical back: Normal range of motion.  Skin:    General: Skin is warm and dry.  Neurological:     General: No focal deficit present.     Mental Status: She is alert.     ED Results / Procedures / Treatments   Labs (all labs ordered are listed, but only abnormal results are displayed) Labs Reviewed  CBC WITH DIFFERENTIAL/PLATELET - Abnormal; Notable for the following components:      Result Value   RBC 5.39 (*)    MCV 78.7 (*)    MCH 25.4 (*)    RDW 16.9 (*)    Platelets 415 (*)    All other components within normal limits  COMPREHENSIVE METABOLIC PANEL - Abnormal; Notable for the following components:   Sodium 133 (*)    Chloride 95 (*)    Glucose, Bld 545 (*)    All other components within normal limits  URINALYSIS, ROUTINE W REFLEX MICROSCOPIC - Abnormal; Notable for the following components:   Color, Urine COLORLESS (*)    Specific Gravity, Urine 1.044 (*)    Glucose, UA >1,000 (*)    Hgb urine dipstick SMALL (*)    Ketones, ur 40 (*)    Protein, ur TRACE (*)    Bacteria, UA RARE (*)    All other components within normal limits  BETA-HYDROXYBUTYRIC ACID - Abnormal; Notable for the following components:   Beta-Hydroxybutyric Acid 2.45 (*)    All other components within normal limits  CBG MONITORING, ED - Abnormal; Notable for the following components:   Glucose-Capillary 507 (*)    All other components within normal limits  I-STAT VENOUS BLOOD GAS, ED - Abnormal; Notable for the following components:   pCO2, Ven 38.3 (*)    pO2, Ven 86 (*)    Sodium 133 (*)    All other components within normal limits  CBG MONITORING, ED - Abnormal; Notable for the following components:   Glucose-Capillary 402 (*)    All other components within normal limits  CBG MONITORING, ED - Abnormal; Notable for the following components:   Glucose-Capillary 225 (*)    All other components within normal limits  BLOOD GAS, VENOUS  HEMOGLOBIN A1C     EKG None  Radiology No results found.  Procedures Procedures    Medications Ordered in ED Medications  lactated ringers bolus 2,000 mL (0 mLs Intravenous Stopped 10/17/23 0230)  insulin aspart (novoLOG) injection 5 Units (5 Units Intravenous Given 10/17/23 0201)  ondansetron (ZOFRAN) injection 4 mg (4 mg Intravenous Given 10/17/23 0201)  lactated ringers bolus 1,000 mL (0 mLs  Intravenous Stopped 10/17/23 0347)  insulin aspart (novoLOG) injection 5 Units (5 Units Intravenous Given 10/17/23 0239)  potassium chloride 10 mEq in 100 mL IVPB (0 mEq Intravenous Stopped 10/17/23 0455)  metFORMIN (GLUCOPHAGE) tablet 500 mg (500 mg Oral Given 10/17/23 0455)    ED Course/ Medical Decision Making/ A&P                                 Medical Decision Making Amount and/or Complexity of Data Reviewed Labs: ordered.  Risk OTC drugs. Prescription drug management.   Found to be hyperglycemic without evidence of DKA.  Hydroxy slightly elevated but not above 5, no acidosis on labs otherwise.  A1c pending at time of discharge but hyperglycemia improved significantly heart rate improved significantly and her blurry vision improved quite a bit as well.  Patient started on metformin.  She has a primary doctor who she can follow-up with closely.  Will defer to them to follow-up A1c and decide on need for insulin or further management but also to help her with glucose management and diabetes education.  Final Clinical Impression(s) / ED Diagnoses Final diagnoses:  Other specified diabetes mellitus without complication, without long-term current use of insulin (HCC)    Rx / DC Orders ED Discharge Orders          Ordered    metFORMIN (GLUCOPHAGE) 500 MG tablet  2 times daily with meals        10/17/23 0543    ondansetron (ZOFRAN-ODT) 4 MG disintegrating tablet        10/17/23 0602              Elverda Wendel, Barbara Cower, MD 10/17/23 [phone]

## 2023-10-17 NOTE — ED Triage Notes (Addendum)
Pt advises "I just don't feel good." Endorses nausea, excessive thirst, blurred vision x2wks. Denies known hx of diabetes. Recent abx therapy for suspected BV, prednisone, clindamycin that she advises she didn't finish "because my vagina is on fire."  CBG at work was 602, 507 in triage.  8mg  zofran, 1mg  klonopin approx 7p

## 2023-10-18 ENCOUNTER — Observation Stay (HOSPITAL_BASED_OUTPATIENT_CLINIC_OR_DEPARTMENT_OTHER)
Admission: EM | Admit: 2023-10-18 | Discharge: 2023-10-19 | Disposition: A | Discharge status: Home / Self Care | Payer: No Typology Code available for payment source | Attending: Internal Medicine | Admitting: Internal Medicine

## 2023-10-18 ENCOUNTER — Emergency Department (HOSPITAL_BASED_OUTPATIENT_CLINIC_OR_DEPARTMENT_OTHER): Payer: No Typology Code available for payment source

## 2023-10-18 ENCOUNTER — Other Ambulatory Visit: Payer: Self-pay | Admitting: Family

## 2023-10-18 ENCOUNTER — Other Ambulatory Visit: Payer: Self-pay

## 2023-10-18 ENCOUNTER — Encounter (HOSPITAL_BASED_OUTPATIENT_CLINIC_OR_DEPARTMENT_OTHER): Payer: Self-pay | Admitting: Emergency Medicine

## 2023-10-18 DIAGNOSIS — Z6841 Body Mass Index (BMI) 40.0 and over, adult: Secondary | ICD-10-CM | POA: Insufficient documentation

## 2023-10-18 DIAGNOSIS — B3731 Acute candidiasis of vulva and vagina: Secondary | ICD-10-CM | POA: Diagnosis not present

## 2023-10-18 DIAGNOSIS — Z7984 Long term (current) use of oral hypoglycemic drugs: Secondary | ICD-10-CM | POA: Diagnosis not present

## 2023-10-18 DIAGNOSIS — J45909 Unspecified asthma, uncomplicated: Secondary | ICD-10-CM | POA: Diagnosis not present

## 2023-10-18 DIAGNOSIS — E860 Dehydration: Secondary | ICD-10-CM | POA: Diagnosis not present

## 2023-10-18 DIAGNOSIS — R112 Nausea with vomiting, unspecified: Secondary | ICD-10-CM | POA: Diagnosis present

## 2023-10-18 DIAGNOSIS — R739 Hyperglycemia, unspecified: Principal | ICD-10-CM

## 2023-10-18 DIAGNOSIS — F419 Anxiety disorder, unspecified: Secondary | ICD-10-CM | POA: Diagnosis not present

## 2023-10-18 DIAGNOSIS — Z23 Encounter for immunization: Secondary | ICD-10-CM | POA: Diagnosis not present

## 2023-10-18 DIAGNOSIS — E66813 Obesity, class 3: Secondary | ICD-10-CM | POA: Diagnosis present

## 2023-10-18 DIAGNOSIS — E1165 Type 2 diabetes mellitus with hyperglycemia: Principal | ICD-10-CM | POA: Diagnosis present

## 2023-10-18 DIAGNOSIS — E1140 Type 2 diabetes mellitus with diabetic neuropathy, unspecified: Secondary | ICD-10-CM | POA: Diagnosis not present

## 2023-10-18 DIAGNOSIS — F32A Depression, unspecified: Secondary | ICD-10-CM | POA: Insufficient documentation

## 2023-10-18 DIAGNOSIS — E118 Type 2 diabetes mellitus with unspecified complications: Secondary | ICD-10-CM | POA: Diagnosis not present

## 2023-10-18 HISTORY — DX: Type 2 diabetes mellitus without complications: E11.9

## 2023-10-18 LAB — GLUCOSE, CAPILLARY
Glucose-Capillary: 246 mg/dL — ABNORMAL HIGH (ref 70–99)
Glucose-Capillary: 446 mg/dL — ABNORMAL HIGH (ref 70–99)
Glucose-Capillary: 232 mg/dL — ABNORMAL HIGH (ref 70–99)

## 2023-10-18 LAB — BASIC METABOLIC PANEL
Anion gap: 10 (ref 5–15)
Anion gap: 11 (ref 5–15)
BUN: 10 mg/dL (ref 6–20)
BUN: 7 mg/dL (ref 6–20)
CO2: 22 mmol/L (ref 22–32)
CO2: 22 mmol/L (ref 22–32)
Calcium: 8.3 mg/dL — ABNORMAL LOW (ref 8.9–10.3)
Calcium: 8.1 mg/dL — ABNORMAL LOW (ref 8.9–10.3)
Chloride: 98 mmol/L (ref 98–111)
Chloride: 103 mmol/L (ref 98–111)
Creatinine, Ser: 0.87 mg/dL (ref 0.44–1.00)
Creatinine, Ser: 0.53 mg/dL (ref 0.44–1.00)
GFR, Estimated: >60 mL/min (ref >60)
GFR, Estimated: >60 mL/min (ref >60)
Glucose, Bld: 545 mg/dL (ref 70–99)
Glucose, Bld: 272 mg/dL — ABNORMAL HIGH (ref 70–99)
Potassium: 3.9 mmol/L (ref 3.5–5.1)
Potassium: 3.6 mmol/L (ref 3.5–5.1)
Sodium: 130 mmol/L — ABNORMAL LOW (ref 135–145)
Sodium: 136 mmol/L (ref 135–145)

## 2023-10-18 LAB — URINALYSIS, MICROSCOPIC (REFLEX)

## 2023-10-18 LAB — CBC WITH DIFFERENTIAL/PLATELET
Abs Immature Granulocytes: 0.02 10*3/uL (ref 0.00–0.07)
Basophils Absolute: 0 10*3/uL (ref 0.0–0.1)
Basophils Relative: 0 %
Eosinophils Absolute: 0.1 10*3/uL (ref 0.0–0.5)
Eosinophils Relative: 1 %
HCT: 36.8 % (ref 36.0–46.0)
Hemoglobin: 12.1 g/dL (ref 12.0–15.0)
Immature Granulocytes: 0 %
Lymphocytes Relative: 28 %
Lymphs Abs: 2 10*3/uL (ref 0.7–4.0)
MCH: 25.9 pg — ABNORMAL LOW (ref 26.0–34.0)
MCHC: 32.9 g/dL (ref 30.0–36.0)
MCV: 78.8 fL — ABNORMAL LOW (ref 80.0–100.0)
Monocytes Absolute: 0.5 10*3/uL (ref 0.1–1.0)
Monocytes Relative: 7 %
Neutro Abs: 4.4 10*3/uL (ref 1.7–7.7)
Neutrophils Relative %: 64 %
Platelets: 321 10*3/uL (ref 150–400)
RBC: 4.67 MIL/uL (ref 3.87–5.11)
RDW: 17.1 % — ABNORMAL HIGH (ref 11.5–15.5)
WBC: 6.9 10*3/uL (ref 4.0–10.5)
nRBC: 0 % (ref 0.0–0.2)

## 2023-10-18 LAB — URINALYSIS, ROUTINE W REFLEX MICROSCOPIC
Bilirubin Urine: NEGATIVE
Glucose, UA: >=500 mg/dL — AB
Ketones, ur: 40 mg/dL — AB
Leukocytes,Ua: NEGATIVE
Nitrite: NEGATIVE
Protein, ur: NEGATIVE mg/dL
Specific Gravity, Urine: 1.01 (ref 1.005–1.030)
pH: 6 (ref 5.0–8.0)

## 2023-10-18 LAB — HCG, SERUM, QUALITATIVE: Preg, Serum: NEGATIVE

## 2023-10-18 LAB — BETA-HYDROXYBUTYRIC ACID: Beta-Hydroxybutyric Acid: 2.75 mmol/L — ABNORMAL HIGH (ref 0.05–0.27)

## 2023-10-18 LAB — CBG MONITORING, ED
Glucose-Capillary: 511 mg/dL (ref 70–99)
Glucose-Capillary: 377 mg/dL — ABNORMAL HIGH (ref 70–99)
Glucose-Capillary: 267 mg/dL — ABNORMAL HIGH (ref 70–99)

## 2023-10-18 LAB — HIV ANTIBODY (ROUTINE TESTING W REFLEX): HIV Screen 4th Generation wRfx: NONREACTIVE

## 2023-10-18 MED ORDER — CLOTRIMAZOLE 1 % VA CREA
1.0000 | TOPICAL_CREAM | Freq: Every day | VAGINAL | Status: DC
  Administered 2023-10-18: 1 via VAGINAL
  Filled 2023-10-18: qty 45

## 2023-10-18 MED ORDER — INSULIN ASPART 100 UNIT/ML IJ SOLN
0.0000 [IU] | Freq: Every day | INTRAMUSCULAR | Status: DC
  Administered 2023-10-18: 2 [IU] via SUBCUTANEOUS

## 2023-10-18 MED ORDER — ONDANSETRON HCL 4 MG/2ML IJ SOLN
4.0000 mg | Freq: Once | INTRAMUSCULAR | Status: AC
  Administered 2023-10-18: 4 mg via INTRAVENOUS
  Filled 2023-10-18: qty 2

## 2023-10-18 MED ORDER — INSULIN GLARGINE-YFGN 100 UNIT/ML ~~LOC~~ SOLN
10.0000 [IU] | Freq: Once | SUBCUTANEOUS | Status: AC
  Administered 2023-10-18: 10 [IU] via SUBCUTANEOUS
  Filled 2023-10-18: qty 100

## 2023-10-18 MED ORDER — LORAZEPAM 1 MG PO TABS
1.0000 mg | ORAL_TABLET | Freq: Four times a day (QID) | ORAL | Status: DC | PRN
  Administered 2023-10-18: 1 mg via ORAL
  Filled 2023-10-18: qty 1

## 2023-10-18 MED ORDER — PAROXETINE HCL 10 MG PO TABS
10.0000 mg | ORAL_TABLET | Freq: Every day | ORAL | Status: DC
  Administered 2023-10-18: 10 mg via ORAL
  Filled 2023-10-18: qty 1

## 2023-10-18 MED ORDER — ALBUTEROL SULFATE (2.5 MG/3ML) 0.083% IN NEBU: 2.5000 mg | INHALATION_SOLUTION | RESPIRATORY_TRACT | Status: DC | PRN

## 2023-10-18 MED ORDER — BUTALBITAL-APAP-CAFFEINE 50-325-40 MG PO TABS
1.0000 | ORAL_TABLET | ORAL | Status: DC | PRN
  Administered 2023-10-18 – 2023-10-19 (×3): 1 via ORAL
  Filled 2023-10-18 (×3): qty 1

## 2023-10-18 MED ORDER — ACETAMINOPHEN 325 MG PO TABS
650.0000 mg | ORAL_TABLET | Freq: Four times a day (QID) | ORAL | Status: DC | PRN
  Administered 2023-10-19: 650 mg via ORAL
  Filled 2023-10-18: qty 2

## 2023-10-18 MED ORDER — METHYLPHENIDATE HCL 20 MG PO TABS: 20.0000 mg | ORAL_TABLET | Freq: Three times a day (TID) | ORAL | Status: DC

## 2023-10-18 MED ORDER — SODIUM CHLORIDE 0.9 % IV BOLUS
1000.0000 mL | Freq: Once | INTRAVENOUS | Status: AC
Start: 2023-10-18 — End: 2023-10-18
  Administered 2023-10-18: 1000 mL via INTRAVENOUS

## 2023-10-18 MED ORDER — PAROXETINE HCL 20 MG PO TABS
20.0000 mg | ORAL_TABLET | Freq: Every day | ORAL | Status: DC
  Administered 2023-10-18 – 2023-10-19 (×2): 20 mg via ORAL
  Filled 2023-10-18 (×2): qty 1

## 2023-10-18 MED ORDER — INSULIN ASPART 100 UNIT/ML IJ SOLN
5.0000 [IU] | Freq: Once | INTRAMUSCULAR | Status: AC
  Administered 2023-10-18: 5 [IU] via SUBCUTANEOUS

## 2023-10-18 MED ORDER — CLONAZEPAM 1 MG PO TABS
1.0000 mg | ORAL_TABLET | Freq: Two times a day (BID) | ORAL | Status: DC | PRN
  Administered 2023-10-18 (×2): 1 mg via ORAL
  Filled 2023-10-18 (×2): qty 1

## 2023-10-18 MED ORDER — SODIUM CHLORIDE 0.9 % IV BOLUS
1000.0000 mL | Freq: Once | INTRAVENOUS | Status: AC
  Administered 2023-10-18: 1000 mL via INTRAVENOUS

## 2023-10-18 MED ORDER — PAROXETINE HCL 10 MG PO TABS: 10.0000 mg | ORAL_TABLET | Freq: Two times a day (BID) | ORAL | Status: DC

## 2023-10-18 MED ORDER — INSULIN GLARGINE-YFGN 100 UNIT/ML ~~LOC~~ SOLN
10.0000 [IU] | Freq: Two times a day (BID) | SUBCUTANEOUS | Status: DC
Start: 2023-10-18 — End: 2023-10-19
  Administered 2023-10-18: 10 [IU] via SUBCUTANEOUS
  Filled 2023-10-18 (×2): qty 0.1

## 2023-10-18 MED ORDER — METFORMIN HCL 500 MG PO TABS
500.0000 mg | ORAL_TABLET | Freq: Two times a day (BID) | ORAL | Status: DC
  Administered 2023-10-18: 500 mg via ORAL
  Filled 2023-10-18 (×2): qty 1

## 2023-10-18 MED ORDER — FENTANYL CITRATE PF 50 MCG/ML IJ SOSY
50.0000 ug | PREFILLED_SYRINGE | Freq: Once | INTRAMUSCULAR | Status: AC
  Administered 2023-10-18: 50 ug via INTRAVENOUS
  Filled 2023-10-18: qty 1

## 2023-10-18 MED ORDER — ONDANSETRON HCL 4 MG/2ML IJ SOLN
4.0000 mg | Freq: Four times a day (QID) | INTRAMUSCULAR | Status: DC | PRN
  Administered 2023-10-18 – 2023-10-19 (×5): 4 mg via INTRAVENOUS
  Filled 2023-10-18 (×6): qty 2

## 2023-10-18 MED ORDER — LORAZEPAM 1 MG PO TABS
1.0000 mg | ORAL_TABLET | Freq: Once | ORAL | Status: AC
  Administered 2023-10-18: 1 mg via ORAL
  Filled 2023-10-18: qty 1

## 2023-10-18 MED ORDER — BUSPIRONE HCL 10 MG PO TABS
30.0000 mg | ORAL_TABLET | Freq: Two times a day (BID) | ORAL | Status: DC
  Administered 2023-10-18 – 2023-10-19 (×3): 30 mg via ORAL
  Filled 2023-10-18 (×3): qty 3

## 2023-10-18 MED ORDER — GABAPENTIN 300 MG PO CAPS
300.0000 mg | ORAL_CAPSULE | Freq: Three times a day (TID) | ORAL | Status: DC
  Administered 2023-10-18 – 2023-10-19 (×4): 300 mg via ORAL
  Filled 2023-10-18 (×4): qty 1

## 2023-10-18 MED ORDER — ENOXAPARIN SODIUM 60 MG/0.6ML IJ SOSY
60.0000 mg | PREFILLED_SYRINGE | INTRAMUSCULAR | Status: DC
  Administered 2023-10-18: 60 mg via SUBCUTANEOUS
  Filled 2023-10-18: qty 0.6

## 2023-10-18 MED ORDER — PAROXETINE HCL 10 MG PO TABS: 10.0000 mg | ORAL_TABLET | Freq: Every day | ORAL | Status: DC

## 2023-10-18 MED ORDER — INSULIN ASPART 100 UNIT/ML IJ SOLN
0.0000 [IU] | Freq: Three times a day (TID) | INTRAMUSCULAR | Status: DC
  Administered 2023-10-18: 5 [IU] via SUBCUTANEOUS
  Administered 2023-10-18: 15 [IU] via SUBCUTANEOUS
  Administered 2023-10-19: 8 [IU] via SUBCUTANEOUS

## 2023-10-18 NOTE — ED Provider Notes (Signed)
[place] EMERGENCY DEPARTMENT AT [facility] Provider Note   CSN: [id number] Arrival date & time: 10/18/23  0054     History  Chief Complaint  Patient presents with   Hyperglycemia    Melissa [name] is a 33 y.o. female.  The history is provided by the patient.  Patient with history of anxiety, asthma, recent new diagnosis of diabetes presents with multiple complaints including continued hyperglycemia and vomiting.  Patient reports around 2 weeks ago she started having blurred vision, feeling thirsty and polyuria.  Also around that time she developed what she thought was bacterial vaginosis and started Flagyl.  She reports Flagyl caused an allergic reaction and she was placed on prednisone Her increased thirst and polyuria have continued.  She was seen in the ER on February 14 and diagnosed with new onset diabetes and was placed on metformin.  Since going home her symptoms have worsened and she is now continuing to vomit.  She is also reporting having significant panic attack and having chest pain from her anxiety She is also reports feeling fatigued and having headaches.    Past Medical History:  Diagnosis Date   Abnormal Pap smear of cervix 07/11/2011   Acid reflux    Anxiety    Asthma    activity induced   Diabetes mellitus without complication (HCC)    Irregular menses    Polycystic ovarian syndrome     Home Medications Prior to Admission medications   Medication Sig Start Date End Date Taking? Authorizing Provider  albuterol (PROVENTIL) (5 MG/ML) 0.5% nebulizer solution Take 0.5 mLs (2.5 mg total) by nebulization every 6 (six) hours as needed for wheezing or shortness of breath. 06/25/22   Sandford Craze, NP  albuterol (VENTOLIN HFA) 108 (90 Base) MCG/ACT inhaler Inhale 2 puffs into the lungs every 6 (six) hours as needed for wheezing or shortness of breath. 06/25/22   Sandford Craze, NP  busPIRone (BUSPAR) 15 MG tablet Take 1 tablet (15 mg total) by  mouth 3 (three) times daily. 08/18/23   Bradd Canary, MD  busPIRone (BUSPAR) 30 MG tablet Take 1 tablet (30 mg total) by mouth 2 (two) times daily. 08/18/23   Bradd Canary, MD  clonazePAM Scarlette Calico) 1 MG tablet Take 1 tablet by mouth twice daily as needed for anxiety 10/13/23   Bradd Canary, MD  fluconazole (DIFLUCAN) 150 MG tablet Take 1 tablet PO once. Repeat in 3 days if needed. 10/01/23   Waldon Merl, PA-C  gabapentin (NEURONTIN) 300 MG capsule Take 1 capsule (300 mg total) by mouth 3 (three) times daily. 08/18/23   Bradd Canary, MD  hydrOXYzine (VISTARIL) 25 MG capsule Take 1 capsule (25 mg total) by mouth every 8 (eight) hours as needed. 08/18/23   Bradd Canary, MD  metFORMIN (GLUCOPHAGE) 500 MG tablet Take 1 tablet (500 mg total) by mouth 2 (two) times daily with a meal. 10/17/23   Mesner, Barbara Cower, MD  methylphenidate (RITALIN) 20 MG tablet Take 1 tablet (20 mg total) by mouth 3 (three) times daily with meals. 10/08/23   Eulis Foster, FNP  norgestimate-ethinyl estradiol (ORTHO-CYCLEN) 0.25-35 MG-MCG tablet Take 1 tablet by mouth daily. 11/27/22   Bradd Canary, MD  ondansetron (ZOFRAN-ODT) 4 MG disintegrating tablet 4mg  ODT q4 hours prn nausea/vomit 10/17/23   Mesner, Barbara Cower, MD  PARoxetine (PAXIL) 10 MG tablet 20 mg in am po and 10 mg in pm 08/18/23   Bradd Canary, MD  Vitamin D, Ergocalciferol, (  DRISDOL) 1.25 MG (50000 UNIT) CAPS capsule Take 1 capsule (50,000 Units total) by mouth every 7 (seven) days. 06/26/22   Bradd Canary, MD      Allergies    Diphenhydramine hcl and Flagyl [metronidazole]    Review of Systems   Review of Systems  Constitutional:  Positive for fatigue. Negative for fever.  Cardiovascular:  Positive for chest pain.  Gastrointestinal:  Positive for nausea and vomiting.  Neurological:  Positive for headaches.  Psychiatric/Behavioral:  The patient is nervous/anxious.     Physical Exam Updated Vital Signs BP 121/80 (BP Location: Right Arm)    Pulse 90   Temp 98.5 F (36.9 C) (Oral)   Resp (!) 24   Ht 1.575 m (5\' 2" )   Wt 115.7 kg   LMP 10/04/2023   SpO2 95%   BMI 46.64 kg/m  Physical Exam CONSTITUTIONAL: Well developed/well nourished, anxious, does not smell of ketones HEAD: Normocephalic/atraumatic EYES: EOMI/PERRL ENMT: Mucous membranes dry NECK: supple no meningeal signs CV: S1/S2 noted, no murmurs/rubs/gallops noted LUNGS: Lungs are clear to auscultation bilaterally, no apparent distress ABDOMEN: soft, nontende NEURO: Pt is awake/alert/appropriate, moves all extremitiesx4.  No facial droop.  No arm or leg drift EXTREMITIES: pulses normal/equal, full ROM Both feet are warm to touch, distal pulses intact, no skin breakdown, no rash or edema to either foot SKIN: warm, color normal PSYCH: Anxious ED Results / Procedures / Treatments   Labs (all labs ordered are listed, but only abnormal results are displayed) Labs Reviewed  BASIC METABOLIC PANEL - Abnormal; Notable for the following components:      Result Value   Sodium 130 (*)    Glucose, Bld 545 (*)    Calcium 8.3 (*)    All other components within normal limits  CBC WITH DIFFERENTIAL/PLATELET - Abnormal; Notable for the following components:   MCV 78.8 (*)    MCH 25.9 (*)    RDW 17.1 (*)    All other components within normal limits  URINALYSIS, ROUTINE W REFLEX MICROSCOPIC - Abnormal; Notable for the following components:   Glucose, UA >=500 (*)    Hgb urine dipstick TRACE (*)    Ketones, ur 40 (*)    All other components within normal limits  URINALYSIS, MICROSCOPIC (REFLEX) - Abnormal; Notable for the following components:   Bacteria, UA FEW (*)    All other components within normal limits  CBG MONITORING, ED - Abnormal; Notable for the following components:   Glucose-Capillary 511 (*)    All other components within normal limits  HCG, SERUM, QUALITATIVE  BETA-HYDROXYBUTYRIC ACID    EKG EKG Interpretation Date/Time:  Sunday October 18 2023  02:30:09 EST Ventricular Rate:  84 PR Interval:  157 QRS Duration:  74 QT Interval:  363 QTC Calculation: 430 R Axis:   19  Text Interpretation: Sinus rhythm Right atrial enlargement No significant change was found when compared to ekg from january 2024 Confirmed by Zadie Rhine ([id number]) on 10/18/2023 2:33:15 AM  Radiology DG Chest Portable 1 View Result Date: 10/18/2023 CLINICAL DATA:  Chest pain EXAM: PORTABLE CHEST 1 VIEW COMPARISON:  09/11/2022 FINDINGS: The heart size and mediastinal contours are within normal limits. Both lungs are clear. The visualized skeletal structures are unremarkable. IMPRESSION: No active disease. Electronically Signed   By: Helyn Numbers M.D.   On: 10/18/2023 02:11    Procedures .Critical Care  Performed by: Zadie Rhine, MD Authorized by: Zadie Rhine, MD   Critical care provider statement:    Critical  care time (minutes):  31   Critical care start time:  10/18/2023 1:59 AM   Critical care end time:  10/18/2023 2:30 AM   Critical care time was exclusive of:  Separately billable procedures and treating other patients   Critical care was necessary to treat or prevent imminent or life-threatening deterioration of the following conditions:  Dehydration, endocrine crisis and metabolic crisis   Critical care was time spent personally by me on the following activities:  Examination of patient, re-evaluation of patient's condition, ordering and review of laboratory studies, ordering and review of radiographic studies, ordering and performing treatments and interventions, development of treatment plan with patient or surrogate, evaluation of patient's response to treatment and obtaining history from patient or surrogate   I assumed direction of critical care for this patient from another provider in my specialty: no     Care discussed with: admitting provider       Medications Ordered in ED Medications  ondansetron (ZOFRAN) injection 4 mg (has no  administration in time range)  insulin glargine-yfgn (SEMGLEE) injection 10 Units (has no administration in time range)  insulin aspart (novoLOG) injection 5 Units (has no administration in time range)  sodium chloride 0.9 % bolus 1,000 mL (has no administration in time range)  sodium chloride 0.9 % bolus 1,000 mL (0 mLs Intravenous Stopped 10/18/23 0217)  LORazepam (ATIVAN) tablet 1 mg (1 mg Oral Given 10/18/23 0109)    ED Course/ Medical Decision Making/ A&P Clinical Course as of 10/18/23 0233  Sun Oct 18, 2023  0151 Glucose-Capillary(!!): 511 Hyperglycemia [DW]  0228 Patient reports with past several weeks has had symptoms of hyperglycemia including nausea, fatigue, polyuria and polydipsia.  She had also been recently placed on prednisone which may have exacerbated the issue.  Was just placed on metformin was not able to tolerate at home and has been vomiting [DW]  0229 Labs confirm hyperglycemia without anion gap.  Patient does appear dehydrated.  2 L of normal saline have been ordered.  Discussed with Dr. Toniann Fail for admission.  He recommends starting on 10 units of Lantus, and NovoLog.  5 units of NovoLog ordered [DW]    Clinical Course User Index [DW] Zadie Rhine, MD                                 Medical Decision Making Amount and/or Complexity of Data Reviewed Labs: ordered. Decision-making details documented in ED Course. Radiology: ordered. ECG/medicine tests: ordered.  Risk Prescription drug management. Decision regarding hospitalization.   This patient presents to the ED for concern of hyperglycemia and fatigue, this involves an extensive number of treatment options, and is a complaint that carries with it a high risk of complications and morbidity.  The differential diagnosis includes but is not limited to CVA, intracranial hemorrhage, acute coronary syndrome, renal failure, urinary tract infection, electrolyte disturbance, pneumonia, diabetic ketoacidosis,  HHS    Comorbidities that complicate the patient evaluation: Patient's presentation is complicated by their history of anxiety  Social Determinants of Health: Patient's  poor health literacy   increases the complexity of managing their presentation  Additional history obtained: Records reviewed Primary Care Documents  Lab Tests: I Ordered, and personally interpreted labs.  The pertinent results include: Hyperglycemia  Imaging Studies ordered: I ordered imaging studies including X-ray chest   I independently visualized and interpreted imaging which showed no acute findings I agree with the radiologist interpretation  Cardiac  Monitoring: The patient was maintained on a cardiac monitor.  I personally viewed and interpreted the cardiac monitor which showed an underlying rhythm of:  sinus rhythm  Medicines ordered and prescription drug management: I ordered medication including IV fluids for hyperglycemia Reevaluation of the patient after these medicines showed that the patient    stayed the same  Critical Interventions:   IV fluids, initiation of insulin  Consultations Obtained: I requested consultation with the admitting physician [facility] , and discussed  findings as well as pertinent plan - they recommend: Admit  Reevaluation: After the interventions noted above, I reevaluated the patient and found that they have :stayed the same  Complexity of problems addressed: Patient's presentation is most consistent with  acute presentation with potential threat to life or bodily function  Disposition: After consideration of the diagnostic results and the patient's response to treatment,  I feel that the patent would benefit from admission   .           Final Clinical Impression(s) / ED Diagnoses Final diagnoses:  Hyperglycemia  Dehydration    Rx / DC Orders ED Discharge Orders     None         Zadie Rhine, MD 10/18/23 [phone]

## 2023-10-18 NOTE — Progress Notes (Signed)
Plan of Care Note for accepted transfer   Patient: Melissa [name] MRN: [id number]   DOA: 10/18/2023  Facility requesting transfer: [facility]. Requesting Provider: Dr. Bebe Shaggy. Reason for transfer: Uncontrolled diabetes.  Symptomatic. Facility course: 33 year old female who had come to the [facility] ER yesterday with hyperglycemia was diagnosed with new onset diabetes and was placed on metformin but after going home patient became more nauseous unable to keep in anything and blurred vision patient was persistently hyperglycemic but not in DKA.  Given the symptoms of weakness and nausea admitted for further hydration and controlling diabetes.  Was started on insulin glargine in the ER with sliding scale coverage and fluids.  Plan of care: The patient is accepted for admission to Telemetry unit, at [facility]..  Author: Eduard Clos, MD 10/18/2023  Check [email or web address] for on-call coverage.  Nursing staff, Please call [facility] Admits & Consults System-Wide number on Amion as soon as patient's arrival, so appropriate admitting provider can evaluate the pt.

## 2023-10-18 NOTE — H&P (Signed)
History and Physical  [name] MVH:[id number] DOB: 1991-06-16 DOA: 10/18/2023  PCP: Bradd Canary, MD   Chief Complaint: High blood sugar  HPI: Melissa [name] is a 33 y.o. female with medical history significant for gestational diabetes, anxiety, PCOS recently diagnosed with diabetes being admitted to the hospital with hyperglycemia.  She was seen in the emergency department on 2/15 at [facility], complaining of several weeks worth of increased thirst, urination, vaginal itching, and blurry vision.  She recently took a course of oral steroids due to possible allergy to Flagyl.  States that when she was trying to get pregnant a few years ago, she did take a course of metformin due to elevated blood sugars.  In the emergency department yesterday, she was noted to have hyperglycemia without evidence of DKA.  A1c was pending, due to improved blood sugar with a dose of insulin and some IV fluids, she was discharged home with a prescription of metformin and told to follow-up with her PCP.  Unfortunately she went home, she continued to have the symptoms, noticed that her blood sugar was still quite elevated and she was having some nausea and vomiting after taking metformin so she came back to the ER.  As detailed below, she had continued hyperglycemia on reevaluation, she was given a dose of Lantus, and admitted to the hospitalist service for management of her symptoms and blood sugar control.  Currently states that she feels much better than when she came back to the ER, nausea is better, thirst and urination have slowed down.  She is concerned about numbness in her feet.  Review of Systems: Please see HPI for pertinent positives and negatives. A complete 10 system review of systems are otherwise negative.  Past Medical History:  Diagnosis Date   Abnormal Pap smear of cervix 07/11/2011   Acid reflux    Anxiety    Asthma    activity induced   Diabetes mellitus without complication (HCC)     Irregular menses    Polycystic ovarian syndrome    Past Surgical History:  Procedure Laterality Date   NO PAST SURGERIES     Social History:  reports that she has never smoked. She has never used smokeless tobacco. She reports that she does not currently use alcohol after a past usage of about 4.0 - 7.0 standard drinks of alcohol per week. She reports that she does not currently use drugs after having used the following drugs: Marijuana.  Allergies  Allergen Reactions   Diphenhydramine Hcl Other (See Comments)   Flagyl [Metronidazole] Swelling    Family History  Problem Relation Age of Onset   Gallbladder disease Mother    Colon polyps Mother    Hypertension Father    Other Father        Hepatitis A   Diabetes Father    Drug abuse Father    Post-traumatic stress disorder Father    Stomach cancer Maternal Grandmother    Diabetes Maternal Grandmother    Colon polyps Maternal Grandmother    Colon cancer Maternal Grandmother    Cancer Maternal Grandmother        stomach cancer   Alcohol abuse Maternal Grandmother    Emphysema Maternal Grandfather    Cancer Maternal Grandfather        smoke, lung cancer   Alcohol abuse Maternal Grandfather    Heart disease Paternal Grandfather    Alcohol abuse Paternal Grandfather    Breast cancer Other  mat great aunts x 2   Colon cancer Other        mat great uncles x 2   Irritable bowel syndrome Sister    Multiple sclerosis Sister    Bipolar disorder Sister    Colon polyps Paternal Uncle    Alcohol abuse Paternal Grandmother    Pancreatic cancer Neg Hx    Esophageal cancer Neg Hx      Prior to Admission medications   Medication Sig Start Date End Date Taking? Authorizing Provider  albuterol (VENTOLIN HFA) 108 (90 Base) MCG/ACT inhaler Inhale 2 puffs into the lungs every 6 (six) hours as needed for wheezing or shortness of breath. 06/25/22  Yes Sandford Craze, NP  aspirin-acetaminophen-caffeine (EXCEDRIN MIGRAINE)  [phone] MG tablet Take 3 tablets by mouth every 6 (six) hours as needed for headache.   Yes [provider]  busPIRone (BUSPAR) 30 MG tablet Take 1 tablet (30 mg total) by mouth 2 (two) times daily. 08/18/23  Yes Bradd Canary, MD  clindamycin (CLEOCIN) 300 MG capsule Take 300 mg by mouth in the morning and at bedtime.   Yes [provider]  clonazePAM (KLONOPIN) 1 MG tablet Take 1 tablet by mouth twice daily as needed for anxiety 10/13/23  Yes Bradd Canary, MD  gabapentin (NEURONTIN) 300 MG capsule Take 1 capsule (300 mg total) by mouth 3 (three) times daily. 08/18/23  Yes Bradd Canary, MD  metFORMIN (GLUCOPHAGE) 500 MG tablet Take 1 tablet (500 mg total) by mouth 2 (two) times daily with a meal. 10/17/23  Yes Mesner, Barbara Cower, MD  methylphenidate (RITALIN) 20 MG tablet Take 1 tablet (20 mg total) by mouth 3 (three) times daily with meals. 10/08/23  Yes Worthy Rancher B, FNP  ondansetron (ZOFRAN-ODT) 4 MG disintegrating tablet 4mg  ODT q4 hours prn nausea/vomit 10/17/23  Yes Mesner, Barbara Cower, MD  PARoxetine (PAXIL) 10 MG tablet 20 mg in am po and 10 mg in pm 08/18/23  Yes Bradd Canary, MD  albuterol (PROVENTIL) (5 MG/ML) 0.5% nebulizer solution Take 0.5 mLs (2.5 mg total) by nebulization every 6 (six) hours as needed for wheezing or shortness of breath. Patient not taking: Reported on 10/18/2023 06/25/22   Sandford Craze, NP  fluconazole (DIFLUCAN) 150 MG tablet Take 1 tablet PO once. Repeat in 3 days if needed. Patient not taking: Reported on 10/18/2023 10/01/23   Waldon Merl, PA-C  hydrOXYzine (VISTARIL) 25 MG capsule Take 1 capsule (25 mg total) by mouth every 8 (eight) hours as needed. Patient not taking: Reported on 10/18/2023 08/18/23   Bradd Canary, MD  lisdexamfetamine (VYVANSE) 50 MG capsule Take 50 mg by mouth daily. Patient not taking: Reported on 10/18/2023    [provider]  norgestimate-ethinyl estradiol (ORTHO-CYCLEN) 0.25-35 MG-MCG tablet Take 1  tablet by mouth daily. Patient not taking: Reported on 10/18/2023 11/27/22   Bradd Canary, MD    Physical Exam: BP 128/87 (BP Location: Left Arm)   Pulse 78   Temp 97.8 F (36.6 C) (Oral)   Resp 18   Ht 5\' 2"  (1.575 m)   Wt 115.7 kg   LMP 10/04/2023   SpO2 100%   BMI 46.64 kg/m  General:  Alert, oriented, calm, anxious about her diagnosis, sitting up on the edge of the bed not in any acute distress. Cardiovascular: RRR, no murmurs or rubs, no peripheral edema  Respiratory: clear to auscultation bilaterally, no wheezes, no crackles  Abdomen: soft, nontender, nondistended, normal bowel tones heard  Skin:  dry, no rashes  Musculoskeletal: no joint effusions, normal range of motion  Psychiatric: appropriate affect, normal speech  Neurologic: extraocular muscles intact, clear speech, moving all extremities with intact sensorium         Labs on Admission:  Basic Metabolic Panel: Recent Labs  Lab 10/17/23 0052 10/17/23 0102 10/18/23 0127 10/18/23 0921  NA 133* 133* 130* 136  K 3.6 3.6 3.9 3.6  CL 95*  --  98 103  CO2 23  --  22 22  GLUCOSE 545*  --  545* 272*  BUN 10  --  10 7  CREATININE 0.84  --  0.87 0.53  CALCIUM 8.9  --  8.3* 8.1*   Liver Function Tests: Recent Labs  Lab 10/17/23 0052  AST 17  ALT 33  ALKPHOS 86  BILITOT 0.6  PROT 7.8  ALBUMIN 4.6   No results for input(s): "LIPASE", "AMYLASE" in the last 168 hours. No results for input(s): "AMMONIA" in the last 168 hours. CBC: Recent Labs  Lab 10/17/23 0052 10/17/23 0102 10/18/23 0127  WBC 9.0  --  6.9  NEUTROABS 4.6  --  4.4  HGB 13.7 14.6 12.1  HCT 42.4 43.0 36.8  MCV 78.7*  --  78.8*  PLT 415*  --  321   Cardiac Enzymes: No results for input(s): "CKTOTAL", "CKMB", "CKMBINDEX", "TROPONINI" in the last 168 hours. BNP (last 3 results) No results for input(s): "BNP" in the last 8760 hours.  ProBNP (last 3 results) No results for input(s): "PROBNP" in the last 8760 hours.  CBG: Recent Labs   Lab 10/17/23 0232 10/17/23 0354 10/18/23 0108 10/18/23 0353 10/18/23 0726  GLUCAP 402* 225* 511* 377* 267*    Radiological Exams on Admission: DG Chest Portable 1 View Result Date: 10/18/2023 CLINICAL DATA:  Chest pain EXAM: PORTABLE CHEST 1 VIEW COMPARISON:  09/11/2022 FINDINGS: The heart size and mediastinal contours are within normal limits. Both lungs are clear. The visualized skeletal structures are unremarkable. IMPRESSION: No active disease. Electronically Signed   By: Helyn Numbers M.D.   On: 10/18/2023 02:11   Assessment/Plan Bekka Qian is a 33 y.o. female with medical history significant for gestational diabetes, anxiety, PCOS recently diagnosed with diabetes being admitted to the hospital with hyperglycemia.    New onset type 2 diabetes-with hyperglycemia, dyspnea, polyuria, peripheral neuropathy, and blurry vision.  Symptoms have improved with improved blood sugar control already. -Observation admission -Carb modified diet -Continue metformin twice daily with Lantus 10 units twice daily, with moderate dose sliding scale -Diabetic educator consult  Peripheral neuropathy-gabapentin  Depression and anxiety-continue home BuSpar, Paxil twice daily, and clonazepam as needed anxiety  History of migraine headache-Fioricet as needed  Vulvovaginal candidiasis-suspected due to vaginal itch and hyperglycemia -Vaginal clotrimazole daily  Obesity-BMI 46.6, complicating all aspects of care  DVT prophylaxis: Lovenox     Code Status: Full Code  Consults called: None  Admission status: Observation  Time spent: 48 minutes  Faolan [place] Sharlette Dense MD [facility] Pager [phone]  If 7PM-7AM, please contact night-coverage [email or web address] Password [facility]  10/18/2023, 10:55 AM

## 2023-10-18 NOTE — ED Notes (Signed)
BG 377 post fluids

## 2023-10-18 NOTE — ED Triage Notes (Signed)
Pt seen yesterday at [facility] ED, states she was told she is diabetic and put on Metformin. She reports high blood sugars despite taking the med twice today. CBG on arrival 511. Last dose of metformin 2000 last night. She reports nausea, blurry vision, shob, dry mouth and HA. Pt mentions muscle spasms and weakness in BIL LE also, and unsteady walking.

## 2023-10-19 ENCOUNTER — Other Ambulatory Visit (HOSPITAL_COMMUNITY): Payer: Self-pay

## 2023-10-19 ENCOUNTER — Encounter (HOSPITAL_COMMUNITY): Payer: Self-pay | Admitting: Internal Medicine

## 2023-10-19 ENCOUNTER — Other Ambulatory Visit: Payer: Self-pay | Admitting: Family Medicine

## 2023-10-19 DIAGNOSIS — E1165 Type 2 diabetes mellitus with hyperglycemia: Secondary | ICD-10-CM | POA: Diagnosis not present

## 2023-10-19 DIAGNOSIS — E118 Type 2 diabetes mellitus with unspecified complications: Secondary | ICD-10-CM

## 2023-10-19 DIAGNOSIS — B3731 Acute candidiasis of vulva and vagina: Secondary | ICD-10-CM

## 2023-10-19 DIAGNOSIS — R112 Nausea with vomiting, unspecified: Secondary | ICD-10-CM

## 2023-10-19 LAB — CBC
HCT: 38.8 % (ref 36.0–46.0)
Hemoglobin: 12.1 g/dL (ref 12.0–15.0)
MCH: 25.3 pg — ABNORMAL LOW (ref 26.0–34.0)
MCHC: 31.2 g/dL (ref 30.0–36.0)
MCV: 81.2 fL (ref 80.0–100.0)
Platelets: 313 10*3/uL (ref 150–400)
RBC: 4.78 MIL/uL (ref 3.87–5.11)
RDW: 16.8 % — ABNORMAL HIGH (ref 11.5–15.5)
WBC: 6.3 10*3/uL (ref 4.0–10.5)
nRBC: 0 % (ref 0.0–0.2)

## 2023-10-19 LAB — GLUCOSE, CAPILLARY
Glucose-Capillary: 389 mg/dL — ABNORMAL HIGH (ref 70–99)
Glucose-Capillary: 309 mg/dL — ABNORMAL HIGH (ref 70–99)
Glucose-Capillary: 262 mg/dL — ABNORMAL HIGH (ref 70–99)
Glucose-Capillary: 141 mg/dL — ABNORMAL HIGH (ref 70–99)

## 2023-10-19 LAB — BASIC METABOLIC PANEL
Anion gap: 11 (ref 5–15)
BUN: 11 mg/dL (ref 6–20)
CO2: 22 mmol/L (ref 22–32)
Calcium: 8.6 mg/dL — ABNORMAL LOW (ref 8.9–10.3)
Chloride: 101 mmol/L (ref 98–111)
Creatinine, Ser: 0.68 mg/dL (ref 0.44–1.00)
GFR, Estimated: >60 mL/min (ref >60)
Glucose, Bld: 380 mg/dL — ABNORMAL HIGH (ref 70–99)
Potassium: 3.9 mmol/L (ref 3.5–5.1)
Sodium: 134 mmol/L — ABNORMAL LOW (ref 135–145)

## 2023-10-19 MED ORDER — LIVING WELL WITH DIABETES BOOK
Freq: Once | Status: AC
  Filled 2023-10-19: qty 1

## 2023-10-19 MED ORDER — GLUCAGON HCL RDNA (DIAGNOSTIC) 1 MG IJ SOLR
1.0000 mg | Freq: Once | INTRAMUSCULAR | Status: DC | PRN
Start: 2023-10-19 — End: 2023-10-19

## 2023-10-19 MED ORDER — INSULIN STARTER KIT- PEN NEEDLES (ENGLISH)
1.0000 | Freq: Once | Status: AC
Start: 2023-10-19 — End: 2023-10-19
  Administered 2023-10-19: 1
  Filled 2023-10-19: qty 1

## 2023-10-19 MED ORDER — PEN NEEDLES 31G X 5 MM MISC
1.0000 | Freq: Three times a day (TID) | 0 refills | Status: DC
Start: 2023-10-19 — End: 2023-12-15
  Filled 2023-10-19: qty 100, 30d supply, fill #0

## 2023-10-19 MED ORDER — BLOOD GLUCOSE MONITOR SYSTEM W/DEVICE KIT
1.0000 | PACK | Freq: Three times a day (TID) | 0 refills | Status: AC
  Filled 2023-10-19: qty 1, 30d supply, fill #0

## 2023-10-19 MED ORDER — INSULIN ASPART 100 UNIT/ML FLEXPEN
4.0000 [IU] | PEN_INJECTOR | Freq: Three times a day (TID) | SUBCUTANEOUS | 0 refills | Status: DC
Start: 2023-10-19 — End: 2023-10-29
  Filled 2023-10-19: qty 15, 30d supply, fill #0

## 2023-10-19 MED ORDER — FLUCONAZOLE 150 MG PO TABS
150.0000 mg | ORAL_TABLET | Freq: Every day | ORAL | 0 refills | Status: AC
  Filled 2023-10-19: qty 5, 5d supply, fill #0

## 2023-10-19 MED ORDER — PNEUMOCOCCAL 20-VAL CONJ VACC 0.5 ML IM SUSY
0.5000 mL | PREFILLED_SYRINGE | INTRAMUSCULAR | Status: AC
  Administered 2023-10-19: 0.5 mL via INTRAMUSCULAR
  Filled 2023-10-19: qty 0.5

## 2023-10-19 MED ORDER — LANCETS MISC
1.0000 | Freq: Three times a day (TID) | 0 refills | Status: DC
  Filled 2023-10-19: qty 100, 30d supply, fill #0

## 2023-10-19 MED ORDER — INFLUENZA VIRUS VACC SPLIT PF (FLUZONE) 0.5 ML IM SUSY
0.5000 mL | PREFILLED_SYRINGE | INTRAMUSCULAR | Status: AC
  Administered 2023-10-19: 0.5 mL via INTRAMUSCULAR
  Filled 2023-10-19: qty 0.5

## 2023-10-19 MED ORDER — INSULIN GLARGINE-YFGN 100 UNIT/ML ~~LOC~~ SOPN
20.0000 [IU] | PEN_INJECTOR | Freq: Every morning | SUBCUTANEOUS | 0 refills | Status: DC
Start: 2023-10-19 — End: 2023-10-27
  Filled 2023-10-19: qty 6, 30d supply, fill #0

## 2023-10-19 MED ORDER — LANCET DEVICE MISC
1.0000 | Freq: Three times a day (TID) | 0 refills | Status: DC
  Filled 2023-10-19: qty 1, fill #0
  Filled 2023-11-05: qty 1, 30d supply, fill #0

## 2023-10-19 MED ORDER — ONDANSETRON 4 MG PO TBDP
4.0000 mg | ORAL_TABLET | Freq: Four times a day (QID) | ORAL | 0 refills | Status: DC | PRN
  Filled 2023-10-19: qty 30, 8d supply, fill #0

## 2023-10-19 MED ORDER — INSULIN GLARGINE-YFGN 100 UNIT/ML ~~LOC~~ SOLN
30.0000 [IU] | Freq: Every day | SUBCUTANEOUS | Status: DC
  Administered 2023-10-19: 30 [IU] via SUBCUTANEOUS
  Filled 2023-10-19: qty 0.3

## 2023-10-19 MED ORDER — BLOOD GLUCOSE TEST VI STRP
1.0000 | ORAL_STRIP | Freq: Three times a day (TID) | 0 refills | Status: DC
  Filled 2023-10-19: qty 100, 30d supply, fill #0

## 2023-10-19 NOTE — Assessment & Plan Note (Signed)
10-19-2023 pt states she is still having vaginal yeast infection. 1 day of diflucan did not help much. Will go ahead and give her 5 days of treatment. Getting her CBG down below 150 should also help.

## 2023-10-19 NOTE — Discharge Summary (Signed)
[facility] Phys[facility] Discharge Summary   Patient name: Melissa [name]  Admit date:     10/18/2023  Discharge date: 10/19/2023  Attending Physician: Eduard Clos [[id number]]  Discharge Physician: Carollee Herter   PCP: Bradd Canary, MD  Admitted From: Home Disposition:  Home  Recommendations for Outpatient Follow-up:  Follow up with PCP this week. Pt to call for appointment to be seen on Thursday, Oct 22, 2023  Home Health:No Equipment/Devices: None  Discharge Condition:Stable CODE STATUS:FULL Diet recommendation: Diabetic Fluid Restriction: None  Hospital Summary: HPI: Melissa [name] is a 33 y.o. female with medical history significant for gestational diabetes, anxiety, PCOS recently diagnosed with diabetes being admitted to the hospital with hyperglycemia.  She was seen in the emergency department on 2/15 at [facility], complaining of several weeks worth of increased thirst, urination, vaginal itching, and blurry vision.  She recently took a course of oral steroids due to possible allergy to Flagyl.  States that when she was trying to get pregnant a few years ago, she did take a course of metformin due to elevated blood sugars.  In the emergency department yesterday, she was noted to have hyperglycemia without evidence of DKA.  A1c was pending, due to improved blood sugar with a dose of insulin and some IV fluids, she was discharged home with a prescription of metformin and told to follow-up with her PCP.  Unfortunately she went home, she continued to have the symptoms, noticed that her blood sugar was still quite elevated and she was having some nausea and vomiting after taking metformin so she came back to the ER.  As detailed below, she had continued hyperglycemia on reevaluation, she was given a dose of Lantus, and admitted to the hospitalist service for management of her symptoms and blood sugar control.  Currently states that she feels much better than when she came  back to the ER, nausea is better, thirst and urination have slowed down.  She is concerned about numbness in her feet.    Significant Events: Admitted 10/18/2023 for new onset diabetes   Significant Labs: WBC 6.9, HgB 12.1, Plt 321 Na 130, K 3.9, CO2 of 22, BUN 10, Scr 0.87, glu 545 A1c of 15%  Significant Imaging Studies: CXR no active disease  Antibiotic Therapy: Anti-infectives (From admission, onward)    None       Procedures:   Consultants:    Hospital Course by Problem: * Uncontrolled type 2 diabetes mellitus with hyperglycemia (HCC) 10-19-2023 pt seen by inpatient diabetes educator. Insulin pen teaching given. 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 A1C 12.1% indicating hyperglycemia for at least 3 months. Discussed with pt's PCP Dr. Abner Greenspan. She or one of her partners has offered to see patient in clinic this week to adjust her insulin. Will discharge on Lantus 20 units QAM along with Novolog 4 units with each meals plus SSI. Inpatient DM coordinator is trying to get pt to wear CGM to make tracking her CBG easier. DC to home with prn zofran for N/V.  Vulvovaginal candidiasis 10-19-2023 pt states she is still having vaginal yeast infection. 1 day of diflucan did not help much. Will go ahead and give her 5 days of treatment. Getting her CBG down below 150 should also help.  Obesity, Class III, BMI 40-49.9 (morbid obesity) (HCC) Estimated body mass index is 46.64 kg/m as calculated from the following:   Height as of this encounter:  5\' 2"  (1.575 m).   Weight as of this encounter: 115.7 kg.   Nausea with vomiting 10-19-2023 DC with prn zofran.    Discharge Diagnoses:  Principal Problem:   Uncontrolled type 2 diabetes mellitus with hyperglycemia (HCC) Active Problems:   Nausea with vomiting   Obesity, Class III, BMI 40-49.9 (morbid obesity) (HCC)   Vulvovaginal candidiasis   Discharge Instructions  Discharge  Instructions     Call MD for:  difficulty breathing, headache or visual disturbances   Complete by: As directed    Call MD for:  extreme fatigue   Complete by: As directed    Call MD for:  hives   Complete by: As directed    Call MD for:  persistant dizziness or light-headedness   Complete by: As directed    Call MD for:  persistant nausea and vomiting   Complete by: As directed    Call MD for:  redness, tenderness, or signs of infection (pain, swelling, redness, odor or green/yellow discharge around incision site)   Complete by: As directed    Call MD for:  severe uncontrolled pain   Complete by: As directed    Call MD for:  temperature >100.4   Complete by: As directed    Diet - low sodium heart healthy   Complete by: As directed    Diet Carb Modified   Complete by: As directed    Discharge instructions   Complete by: As directed    1. Call Dr. Mariel Aloe office for appointment to be seen on Thursday, Oct 21, 2024.  Dr. Mariel Aloe office is aware you will be calling.  they will work you in but you MUST call to get your appointment. [phone]   Increase activity slowly   Complete by: As directed       Allergies as of 10/19/2023       Reactions   Diphenhydramine Hcl Other (See Comments)   Flagyl [metronidazole] Swelling        Medication List     STOP taking these medications    clindamycin 300 MG capsule Commonly known as: CLEOCIN   hydrOXYzine 25 MG capsule Commonly known as: VISTARIL   lisdexamfetamine 50 MG capsule Commonly known as: VYVANSE   metFORMIN 500 MG tablet Commonly known as: GLUCOPHAGE   metroNIDAZOLE 500 MG tablet Commonly known as: FLAGYL   ondansetron 4 MG tablet Commonly known as: Zofran   predniSONE 20 MG tablet Commonly known as: DELTASONE       TAKE these medications    albuterol 108 (90 Base) MCG/ACT inhaler Commonly known as: VENTOLIN HFA Inhale 2 puffs into the lungs every 6 (six) hours as needed for wheezing or shortness of  breath. What changed: Another medication with the same name was removed. Continue taking this medication, and follow the directions you see here.   aspirin-acetaminophen-caffeine 250-250-65 MG tablet Commonly known as: EXCEDRIN MIGRAINE Take 3 tablets by mouth every 6 (six) hours as needed for headache.   Blood Glucose Monitor System w/Device Kit Use to check blood sugar as directed   BLOOD GLUCOSE TEST STRIPS Strp Use to check blood sugar as directed three times daily   busPIRone 30 MG tablet Commonly known as: BUSPAR Take 1 tablet (30 mg total) by mouth 2 (two) times daily.   clonazePAM 1 MG tablet Commonly known as: KLONOPIN Take 1 tablet by mouth twice daily as needed for anxiety   fluconazole 150 MG tablet Commonly known as: DIFLUCAN Take 1 tablet (150 mg total)  by mouth daily for 5 days. What changed:  how much to take how to take this when to take this additional instructions   gabapentin 300 MG capsule Commonly known as: NEURONTIN Take 1 capsule (300 mg total) by mouth 3 (three) times daily.   insulin aspart 100 UNIT/ML FlexPen Commonly known as: NOVOLOG Inject 4 Units into the skin 3 (three) times daily with meals. If eating and Blood Glucose (BG) 80 or higher inject 4 units for meal coverage and add correction dose per scale. If not eating, correction dose only. BG <150= 0 unit; BG 150-200= 1 unit; BG 201-250= 3 unit; BG 251-300= 5 unit; BG 301-350= 7 unit; BG 351-400= 9 unit; BG >400= 11 unit and Call Primary Care.   insulin glargine-yfgn 100 UNIT/ML Pen Commonly known as: SEMGLEE Inject 20 Units into the skin in the morning. May substitute as needed per insurance.   Lancet Device Misc 1 each by Does not apply route 3 (three) times daily. May dispense any manufacturer covered by patient's insurance.   Lancets Misc Use to check blood sugar three times daily   methylphenidate 20 MG tablet Commonly known as: Ritalin Take 1 tablet (20 mg total) by mouth 3  (three) times daily with meals.   norgestimate-ethinyl estradiol 0.25-35 MG-MCG tablet Commonly known as: ORTHO-CYCLEN Take 1 tablet by mouth daily.   ondansetron 4 MG disintegrating tablet Commonly known as: ZOFRAN-ODT Take 1 tablet (4 mg total) by mouth every 6 (six) hours as needed for nausea or vomiting. 4mg  ODT q4 hours prn nausea/vomit What changed:  how much to take how to take this when to take this reasons to take this   PARoxetine 10 MG tablet Commonly known as: PAXIL 20 mg in am po and 10 mg in pm   Pen Needles 31G X 5 MM Misc Use to inject insulin as directed        Allergies  Allergen Reactions   Diphenhydramine Hcl Other (See Comments)   Flagyl [Metronidazole] Swelling    Discharge Exam: Vitals:   10/19/23 0356 10/19/23 1343  BP: 119/77 122/83  Pulse: 86 89  Resp: 20 18  Temp: 97.8 F (36.6 C) 98.2 F (36.8 C)  SpO2: 99% 97%    Physical Exam Vitals and nursing note reviewed.  Constitutional:      General: She is not in acute distress.    Appearance: She is obese. She is not toxic-appearing or diaphoretic.  HENT:     Head: Normocephalic and atraumatic.     Nose: Nose normal.  Eyes:     General: No scleral icterus. Cardiovascular:     Rate and Rhythm: Normal rate and regular rhythm.  Pulmonary:     Effort: Pulmonary effort is normal.     Breath sounds: Normal breath sounds.  Abdominal:     General: Abdomen is protuberant. Bowel sounds are normal. There is no distension.     Palpations: Abdomen is soft.  Skin:    General: Skin is warm and dry.     Capillary Refill: Capillary refill takes less than 2 seconds.  Neurological:     General: No focal deficit present.     Mental Status: She is alert and oriented to person, place, and time.     The results of significant diagnostics from this hospitalization (including imaging, microbiology, ancillary and laboratory) are listed below for reference.    Labs: Basic Metabolic Panel: Recent  Labs  Lab 10/17/23 0052 10/17/23 0102 10/18/23 0127 10/18/23 2130 10/19/23 8657  NA 133* 133* 130* 136 134*  K 3.6 3.6 3.9 3.6 3.9  CL 95*  --  98 103 101  CO2 23  --  22 22 22   GLUCOSE 545*  --  545* 272* 380*  BUN 10  --  10 7 11   CREATININE 0.84  --  0.87 0.53 0.68  CALCIUM 8.9  --  8.3* 8.1* 8.6*   Liver Function Tests: Recent Labs  Lab 10/17/23 0052  AST 17  ALT 33  ALKPHOS 86  BILITOT 0.6  PROT 7.8  ALBUMIN 4.6   CBC: Recent Labs  Lab 10/17/23 0052 10/17/23 0102 10/18/23 0127 10/19/23 0428  WBC 9.0  --  6.9 6.3  NEUTROABS 4.6  --  4.4  --   HGB 13.7 14.6 12.1 12.1  HCT 42.4 43.0 36.8 38.8  MCV 78.7*  --  78.8* 81.2  PLT 415*  --  321 313   CBG: Recent Labs  Lab 10/18/23 2030 10/19/23 0404 10/19/23 0628 10/19/23 0804 10/19/23 1108  GLUCAP 232* 389* 309* 262* 141*   Hgb A1c Recent Labs    10/17/23 0142  HGBA1C 12.1*   Urinalysis    Component Value Date/Time   COLORURINE YELLOW 10/18/2023 0141   APPEARANCEUR CLEAR 10/18/2023 0141   LABSPEC 1.010 10/18/2023 0141   PHURINE 6.0 10/18/2023 0141   GLUCOSEU >=500 (A) 10/18/2023 0141   GLUCOSEU NEGATIVE 02/09/2017 0837   HGBUR TRACE (A) 10/18/2023 0141   BILIRUBINUR NEGATIVE 10/18/2023 0141   BILIRUBINUR Negative 06/17/2021 1601   KETONESUR 40 (A) 10/18/2023 0141   PROTEINUR NEGATIVE 10/18/2023 0141   UROBILINOGEN 0.2 06/17/2021 1601   UROBILINOGEN 0.2 02/09/2017 0837   NITRITE NEGATIVE 10/18/2023 0141   LEUKOCYTESUR NEGATIVE 10/18/2023 0141   Sepsis Labs Recent Labs  Lab 10/17/23 0052 10/18/23 0127 10/19/23 0428  WBC 9.0 6.9 6.3    Procedures/Studies: DG Chest Portable 1 View Result Date: 10/18/2023 CLINICAL DATA:  Chest pain EXAM: PORTABLE CHEST 1 VIEW COMPARISON:  09/11/2022 FINDINGS: The heart size and mediastinal contours are within normal limits. Both lungs are clear. The visualized skeletal structures are unremarkable. IMPRESSION: No active disease. Electronically Signed   By:  Helyn Numbers M.D.   On: 10/18/2023 02:11    Time coordinating discharge: 45 mins  SIGNED:  Carollee Herter, DO [facility] 10/19/23, 1:58 PM

## 2023-10-19 NOTE — Progress Notes (Signed)
PROGRESS NOTE    [name]  NFA:[id number] DOB: Dec 30, 1990 DOA: 10/18/2023 PCP: Bradd Canary, MD  Subjective: Pt seen and examined. CBG improved. Started on lantus 30 units this AM. CBG down to 141 this afternoon. Pt has seen inpatient diabetes educator.   Hospital Course: HPI: Melissa [name] is a 33 y.o. female with medical history significant for gestational diabetes, anxiety, PCOS recently diagnosed with diabetes being admitted to the hospital with hyperglycemia.  She was seen in the emergency department on 2/15 at [facility], complaining of several weeks worth of increased thirst, urination, vaginal itching, and blurry vision.  She recently took a course of oral steroids due to possible allergy to Flagyl.  States that when she was trying to get pregnant a few years ago, she did take a course of metformin due to elevated blood sugars.  In the emergency department yesterday, she was noted to have hyperglycemia without evidence of DKA.  A1c was pending, due to improved blood sugar with a dose of insulin and some IV fluids, she was discharged home with a prescription of metformin and told to follow-up with her PCP.  Unfortunately she went home, she continued to have the symptoms, noticed that her blood sugar was still quite elevated and she was having some nausea and vomiting after taking metformin so she came back to the ER.  As detailed below, she had continued hyperglycemia on reevaluation, she was given a dose of Lantus, and admitted to the hospitalist service for management of her symptoms and blood sugar control.  Currently states that she feels much better than when she came back to the ER, nausea is better, thirst and urination have slowed down.  She is concerned about numbness in her feet.    Significant Events: Admitted 10/18/2023 for new onset diabetes   Significant Labs: WBC 6.9, HgB 12.1, Plt 321 Na 130, K 3.9, CO2 of 22, BUN 10, Scr 0.87, glu 545 A1c of  15%  Significant Imaging Studies: CXR no active disease  Antibiotic Therapy: Anti-infectives (From admission, onward)    None       Procedures:   Consultants:     Assessment and Plan: * Uncontrolled type 2 diabetes mellitus with hyperglycemia (HCC) 10-19-2023 pt seen by inpatient diabetes educator. Insulin pen teaching given. 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 A1C 12.1% indicating hyperglycemia for at least 3 months. Discussed with pt's PCP Dr. Abner Greenspan. She or one of her partners has offered to see patient in clinic this week to adjust her insulin. Will discharge on Lantus 20 units QAM along with Novolog 4 units with each meals plus SSI. Inpatient DM coordinator is trying to get pt to wear CGM to make tracking her CBG easier. DC to home with prn zofran for N/V.  Vulvovaginal candidiasis 10-19-2023 pt states she is still having vaginal yeast infection. 1 day of diflucan did not help much. Will go ahead and give her 5 days of treatment. Getting her CBG down below 150 should also help.  Obesity, Class III, BMI 40-49.9 (morbid obesity) (HCC) Estimated body mass index is 46.64 kg/m as calculated from the following:   Height as of this encounter: 5\' 2"  (1.575 m).   Weight as of this encounter: 115.7 kg.   Nausea with vomiting 10-19-2023 DC with prn zofran.  DVT prophylaxis: SCDs Start: 10/18/23 1055    Code Status: Full Code Family Communication: no family at bedside Disposition  Plan: return home Reason for continuing need for hospitalization: medically stable for DC today.  Objective: Vitals:   10/18/23 1300 10/18/23 2032 10/19/23 0356 10/19/23 1343  BP: 128/68 (!) 138/94 119/77 122/83  Pulse: (!) 102 100 86 89  Resp: 18 20 20 18   Temp: 97.8 F (36.6 C) 97.9 F (36.6 C) 97.8 F (36.6 C) 98.2 F (36.8 C)  TempSrc: Oral Oral Oral Oral  SpO2: 99% 100% 99% 97%  Weight:      Height:        Intake/Output Summary (Last 24  hours) at 10/19/2023 1357 Last data filed at 10/19/2023 0410 Gross per 24 hour  Intake 720 ml  Output 1200 ml  Net -480 ml   Filed Weights   10/18/23 0118  Weight: 115.7 kg    Examination:  Physical Exam Vitals and nursing note reviewed.  Constitutional:      General: She is not in acute distress.    Appearance: She is obese. She is not toxic-appearing or diaphoretic.  HENT:     Head: Normocephalic and atraumatic.     Nose: Nose normal.  Eyes:     General: No scleral icterus. Cardiovascular:     Rate and Rhythm: Normal rate and regular rhythm.  Pulmonary:     Effort: Pulmonary effort is normal.     Breath sounds: Normal breath sounds.  Abdominal:     General: Abdomen is protuberant. Bowel sounds are normal. There is no distension.     Palpations: Abdomen is soft.  Skin:    General: Skin is warm and dry.     Capillary Refill: Capillary refill takes less than 2 seconds.  Neurological:     General: No focal deficit present.     Mental Status: She is alert and oriented to person, place, and time.     Data Reviewed: I have personally reviewed following labs and imaging studies  CBC: Recent Labs  Lab 10/17/23 0052 10/17/23 0102 10/18/23 0127 10/19/23 0428  WBC 9.0  --  6.9 6.3  NEUTROABS 4.6  --  4.4  --   HGB 13.7 14.6 12.1 12.1  HCT 42.4 43.0 36.8 38.8  MCV 78.7*  --  78.8* 81.2  PLT 415*  --  321 313   Basic Metabolic Panel: Recent Labs  Lab 10/17/23 0052 10/17/23 0102 10/18/23 0127 10/18/23 0921 10/19/23 0428  NA 133* 133* 130* 136 134*  K 3.6 3.6 3.9 3.6 3.9  CL 95*  --  98 103 101  CO2 23  --  22 22 22   GLUCOSE 545*  --  545* 272* 380*  BUN 10  --  10 7 11   CREATININE 0.84  --  0.87 0.53 0.68  CALCIUM 8.9  --  8.3* 8.1* 8.6*   GFR: Estimated Creatinine Clearance: 118.2 mL/min (by C-G formula based on SCr of 0.68 mg/dL). Liver Function Tests: Recent Labs  Lab 10/17/23 0052  AST 17  ALT 33  ALKPHOS 86  BILITOT 0.6  PROT 7.8  ALBUMIN 4.6    HbA1C: Recent Labs    10/17/23 0142  HGBA1C 12.1*   CBG: Recent Labs  Lab 10/18/23 2030 10/19/23 0404 10/19/23 0628 10/19/23 0804 10/19/23 1108  GLUCAP 232* 389* 309* 262* 141*   Radiology Studies: DG Chest Portable 1 View Result Date: 10/18/2023 CLINICAL DATA:  Chest pain EXAM: PORTABLE CHEST 1 VIEW COMPARISON:  09/11/2022 FINDINGS: The heart size and mediastinal contours are within normal limits. Both lungs are clear. The visualized skeletal structures are  unremarkable. IMPRESSION: No active disease. Electronically Signed   By: Helyn Numbers M.D.   On: 10/18/2023 02:11    Scheduled Meds:  busPIRone  30 mg Oral BID   clotrimazole  1 Applicatorful Vaginal QHS   enoxaparin (LOVENOX) injection  60 mg Subcutaneous Q24H   gabapentin  300 mg Oral TID   insulin aspart  0-15 Units Subcutaneous TID WC   insulin aspart  0-5 Units Subcutaneous QHS   insulin glargine-yfgn  30 Units Subcutaneous Daily   metFORMIN  500 mg Oral BID WC   PARoxetine  20 mg Oral Daily   And   PARoxetine  10 mg Oral QHS   Continuous Infusions:   LOS: 0 days   Time spent: 45 minutes  Carollee Herter, DO  [facility]  10/19/2023, 1:57 PM

## 2023-10-19 NOTE — Progress Notes (Signed)
   10/19/23 0953  TOC Brief Assessment  Insurance and Status Reviewed  Patient has primary care physician Yes  Home environment has been reviewed Resides in single family home with children  Prior level of function: Independent with ADLs at baseline  Prior/Current Home Services No current home services  Social Drivers of Health Review SDOH reviewed no interventions necessary  Readmission risk has been reviewed Yes  Transition of care needs no transition of care needs at this time

## 2023-10-19 NOTE — Subjective & Objective (Signed)
Pt seen and examined. CBG improved. Started on lantus 30 units this AM. CBG down to 141 this afternoon. Pt has seen inpatient diabetes educator.

## 2023-10-19 NOTE — Inpatient Diabetes Management (Addendum)
Inpatient Diabetes Program Recommendations  AACE/ADA: New Consensus Statement on Inpatient Glycemic Control (2015)  Target Ranges:  Prepandial:   less than 140 mg/dL      Peak postprandial:   less than 180 mg/dL (1-2 hours)      Critically ill patients:  140 - 180 mg/dL   Lab Results  Component Value Date   GLUCAP 262 (H) 10/19/2023   HGBA1C 12.1 (H) 10/17/2023    Review of Glycemic Control  Latest Reference Range & Units 10/18/23 07:26 10/18/23 11:29 10/18/23 17:35 10/18/23 20:30 10/19/23 04:04 10/19/23 06:28 10/19/23 08:04  Glucose-Capillary 70 - 99 mg/dL 119 (H) 147 (H) 829 (H) 232 (H) 389 (H) 309 (H) 262 (H)  (H): Data is abnormally high  Diabetes history: Newly diagnosed with T2DM, history of GDM Outpatient Diabetes medications: Metformin 500 mg BID Current orders for Inpatient glycemic control: Semglee 30 units every day, Novolog 0-15 units TID and 0-5 units at bedtime, Metformin 500 mg BID  Received referral for new DM/A1C > 10%/new to insulin.  Asked our [facility] pharmacy for a benefit check on insulins.  Her insurance is currently inactive.  Will ask TOC for a MATCH letter and refer her for OP pharmacy follow up.  Ordered the LWWDM booklet and insulin starter kit.    Met with patient at bedside.  She is very tearful and has a headache and stomach ache.  She was recently at [facility] ER with hyperglycemia.  They sent her home on Metformin 500 mg BID.  She took 2 doses and was still not and passed out at [employer]    A friend gave her a glucometer and her glucose continued to read "hi" so she came to the ED.   Spoke with her about new diagnosis. Discussed A1C results with her and explained what an A1C is, basic pathophysiology of DM Type 2, basic home care, basic diabetes diet nutrition principles, importance of checking CBGs and maintaining good CBG control to prevent long-term and short-term complications. Reviewed signs and symptoms of hyperglycemia and hypoglycemia and  how to treat hypoglycemia at home. Also reviewed blood sugar goals at home.  RNs to provide ongoing basic DM education at bedside with this patient. Have ordered educational booklet, insulin starter kit, and DM videos. Have also placed RD consult for DM diet education for this patient.   She is aware she will need insulin going home.  Ask out [facility] pharmacy for a benefit check on insulins.  Her insurance is not active.  She thinks it will become active next week.  She will need medication assistance at DC.  TOC/MD aware.    Educated patient on insulin pen use at home. Reviewed contents of insulin flexpen starter kit. Reviewed all steps of insulin pen including attachment of needle, 2-unit air shot, dialing up dose, giving injection, removing needle, disposal of sharps, storage of unused insulin, disposal of insulin etc. Patient able to provide successful return demonstration. Also reviewed troubleshooting with insulin pen. MD to give patient Rxs for insulin pens and insulin pen needles.  She is familiar with the insulin pen.  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    Current with her PCP, has an appointment on March 27th and is trying to bump it up.  I would like her to see her PCP within 1 week of DC.    Reviewed hypoglycemia, < 70 mg/dL, signs, symptoms and treatments.   Educated on the Freestyle Libr 3 CGM.  She  is unable to download the app on her phone because she cannot remember her password for [employer].  She has been locked out for 1 hour.  Will return in 1 hour to see if she was able to get into the Freestyle app.    Spoke with her about speaking with her PCP about starting a GLP-1ra.  Will continue to follow while inpatient.  Thank you, Dulce Sellar, MSN, CDCES Diabetes Coordinator Inpatient Diabetes Program [phone] (team pager from 8a-5p)

## 2023-10-19 NOTE — Assessment & Plan Note (Signed)
Estimated body mass index is 46.64 kg/m as calculated from the following:   Height as of this encounter: 5\' 2"  (1.575 m).   Weight as of this encounter: 115.7 kg.

## 2023-10-19 NOTE — Hospital Course (Signed)
HPI: Melissa [name] is a 33 y.o. female with medical history significant for gestational diabetes, anxiety, PCOS recently diagnosed with diabetes being admitted to the hospital with hyperglycemia.  She was seen in the emergency department on 2/15 at [facility], complaining of several weeks worth of increased thirst, urination, vaginal itching, and blurry vision.  She recently took a course of oral steroids due to possible allergy to Flagyl.  States that when she was trying to get pregnant a few years ago, she did take a course of metformin due to elevated blood sugars.  In the emergency department yesterday, she was noted to have hyperglycemia without evidence of DKA.  A1c was pending, due to improved blood sugar with a dose of insulin and some IV fluids, she was discharged home with a prescription of metformin and told to follow-up with her PCP.  Unfortunately she went home, she continued to have the symptoms, noticed that her blood sugar was still quite elevated and she was having some nausea and vomiting after taking metformin so she came back to the ER.  As detailed below, she had continued hyperglycemia on reevaluation, she was given a dose of Lantus, and admitted to the hospitalist service for management of her symptoms and blood sugar control.  Currently states that she feels much better than when she came back to the ER, nausea is better, thirst and urination have slowed down.  She is concerned about numbness in her feet.    Significant Events: Admitted 10/18/2023 for new onset diabetes   Significant Labs: WBC 6.9, HgB 12.1, Plt 321 Na 130, K 3.9, CO2 of 22, BUN 10, Scr 0.87, glu 545 A1c of 15%  Significant Imaging Studies: CXR no active disease  Antibiotic Therapy: Anti-infectives (From admission, onward)    None       Procedures:   Consultants:

## 2023-10-19 NOTE — Progress Notes (Signed)
AVS reviewed w/ patient who verbalized an understanding. TOC meds in a secure bag delivered by pharmacist to patient in room. Pt's home meds also returned to her. Both flu & prevnar vaccine given as noted. PIV removed as noted. Work note obtained from Dr Imogene Burn per pt request. No other questions at this time. Pt dressing for d/c to home.

## 2023-10-19 NOTE — Assessment & Plan Note (Addendum)
10-19-2023 pt seen by inpatient diabetes educator. Insulin pen teaching given. 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 A1C 12.1% indicating hyperglycemia for at least 3 months. Discussed with pt's PCP Dr. Abner Greenspan. She or one of her partners has offered to see patient in clinic this week to adjust her insulin. Will discharge on Lantus 20 units QAM along with Novolog 4 units with each meals plus SSI. Inpatient DM coordinator is trying to get pt to wear CGM to make tracking her CBG easier. DC to home with prn zofran for N/V.

## 2023-10-19 NOTE — Assessment & Plan Note (Signed)
10-19-2023 DC with prn zofran.

## 2023-10-19 NOTE — Progress Notes (Signed)
   10/19/23 1400  Spiritual Encounters  Type of Visit Initial  Care provided to: Patient  Referral source Chaplain team;Chaplain assessment;Clinical staff  Reason for visit Urgent spiritual support  OnCall Visit No  Spiritual Framework  Presenting Themes Meaning/purpose/sources of inspiration;Goals in life/care;Values and beliefs;Significant life change;Caregiving needs;Coping tools;Impactful experiences and emotions;Courage hope and growth;Rituals and practive  Patient Stress Factors Family relationships;Exhausted;Health changes;Lack of knowledge;Loss of control;Major life changes  Family Stress Factors Family relationships;Lack of knowledge;Loss of control  Interventions  Spiritual Care Interventions Made Established relationship of care and support;Compassionate presence;Explored ethical dilemma;Reflective listening;Normalization of emotions;Decision-making support/facilitation;Meaning making;Self-care teaching;Prayer  Spiritual Care Plan  Spiritual Care Issues Still Outstanding Additional issues (comment);No further spiritual care needs at this time (see row info)   Chaplain made visit to patient today.  She requested prayer and urgently asked "Why is this happening? Why did God do this to me?"  Chaplain provided emotional and spiritual support through active listening and education of spiritual self care in the midst of her new health challenges and family life.  Provided prayer at her request and offered encouragement and support.  Ended visit with a departing blessing.

## 2023-10-19 NOTE — TOC Progression Note (Addendum)
Transition of Care [facility]) - Progression Note    Patient Details  Name: Melissa [name] MRN: [id number] Date of Birth: [birth year]  Transition of [facility]) CM/SW Contact  Romaine Neville, Olegario Messier, RN Phone Number: 10/19/2023, 1:09 PM  Clinical Narrative:Referral for med asst-awaiting cost of meds from pharmacy to asst w/PFORCE. Noted patient has $3 co pay.     MATCH MEDICATION ASSISTANCE CARD Pharmacies please call [phone] for claim processing assistance.  Rx BIN: [id number] Rx Group: [id number] Rx PCN: [id number] Relationship Code: 1 Person Code: 01  Patient ID (MRN): [id number]    Patient Name: Melissa [name]   Patient DOB: [birth year]   Discharge Date: 10/19/2023  Expiration Date:10/26/2023 (must be filled within 7 days of discharge)   Dear Ms. [name] You have been approved to have the prescriptions written by your discharging physician filled through our [facility] (Medication Assistance Through [facility]) program. This program allows for a one-time (no refills) 34-day supply of selected medications for a low copay amount.  The copay is $3.00 per prescription. For instance, if you have one prescription, you will pay $3.00; for two prescriptions, you pay $6.00; for three prescriptions, you pay $9.00; and so on. Only certain pharmacies are participating in this program with [facility]. You will need to select one of the pharmacies from the attached lists and take your prescriptions, this letter, and your photo ID to one of the participating pharmacies.  We are excited that you are able to use the [facility] program to get your medications. These prescriptions must be filled within 7 days of hospital discharge or they will no longer be valid for the [facility] program. Should you have any problems with your prescriptions please contact your case management team member at [phone] for [place] or [phone] for [facility].  Thank you, [place]    Barriers to  Discharge: Continued Medical Work up  Expected Discharge Plan and Services                                               Social Determinants of Health (SDOH) Interventions SDOH Screenings   Food Insecurity: No Food Insecurity (10/18/2023)  Recent Concern: Food Insecurity - Food Insecurity Present (08/17/2023)  Housing: High Risk (10/18/2023)  Transportation Needs: No Transportation Needs (10/18/2023)  Recent Concern: Transportation Needs - Unmet Transportation Needs (08/17/2023)  Utilities: Not At Risk (10/18/2023)  Depression (PHQ2-9): High Risk (02/04/2022)  Financial Resource Strain: High Risk (08/17/2023)  Physical Activity: Unknown (08/17/2023)  Social Connections: Moderately Integrated (08/17/2023)  Stress: Stress Concern Present (08/17/2023)  Tobacco Use: Low Risk  (10/18/2023)    Readmission Risk Interventions     No data to display

## 2023-10-19 NOTE — TOC Transition Note (Signed)
Transition of Care [facility]) - Discharge Note  Patient Details  Name: Melissa [name] MRN: [id number] Date of Birth: October 07, 1990  Transition of Care [facility]) CM/SW Contact:  Ewing Schlein, LCSW Phone Number: 10/19/2023, 1:44 PM  Clinical Narrative: Patient's insurance is not active and will need a MATCH voucher. Patient confirmed she can afford the $3 copay per medication. MATCH voucher provided to patient. Patient will follow up with her PCP, Dr. Abner Greenspan, on Thursday Oct 29, 2023 2:20 PM. Appointment already listed on AVS. TOC signing off.  Final next level of care: Home/Self Care Barriers to Discharge: Barriers Resolved  Patient Goals and CMS Choice Patient states their goals for this hospitalization and ongoing recovery are:: Get medication assistance  Discharge Plan and Services Additional resources added to the After Visit Summary for   Discharge Planning Services: MATCH Program       DME Arranged: N/A DME Agency: NA  Social Drivers of Health (SDOH) Interventions SDOH Screenings   Food Insecurity: No Food Insecurity (10/18/2023)  Recent Concern: Food Insecurity - Food Insecurity Present (08/17/2023)  Housing: High Risk (10/18/2023)  Transportation Needs: No Transportation Needs (10/18/2023)  Recent Concern: Transportation Needs - Unmet Transportation Needs (08/17/2023)  Utilities: Not At Risk (10/18/2023)  Depression (PHQ2-9): High Risk (02/04/2022)  Financial Resource Strain: High Risk (08/17/2023)  Physical Activity: Unknown (08/17/2023)  Social Connections: Moderately Integrated (08/17/2023)  Stress: Stress Concern Present (08/17/2023)  Tobacco Use: Low Risk  (10/18/2023)   Readmission Risk Interventions     No data to display

## 2023-10-20 ENCOUNTER — Telehealth (INDEPENDENT_AMBULATORY_CARE_PROVIDER_SITE_OTHER): Payer: No Typology Code available for payment source | Admitting: Family Medicine

## 2023-10-20 ENCOUNTER — Inpatient Hospital Stay: Payer: No Typology Code available for payment source | Admitting: Family

## 2023-10-20 ENCOUNTER — Ambulatory Visit: Payer: Self-pay | Admitting: Family Medicine

## 2023-10-20 ENCOUNTER — Encounter: Payer: Self-pay | Admitting: Family Medicine

## 2023-10-20 VITALS — BP 112/71 | HR 106 | Ht 62.0 in | Wt 248.0 lb

## 2023-10-20 DIAGNOSIS — E13649 Other specified diabetes mellitus with hypoglycemia without coma: Secondary | ICD-10-CM

## 2023-10-20 NOTE — Telephone Encounter (Signed)
Copied from CRM [id number]. Topic: Clinical - Red Word Triage >> Oct 20, 2023  8:15 AM Truddie Crumble wrote: Patient/patient representative is calling to cancel or reschedule an appointment. Refer to attachments for appointment information. Patient called wanting to make an earlier appointment for a hospital follow-up which is scheduled on 2/27. The patient stated she was just released from the hospital last night and stated the hospital doctor talked to her doctor directly to make an earlier appointment regarding her blood sugar which is 600.   Chief Complaint: hospital follow up for elevated blood glucose Symptoms: frequent urination and swollen feet Frequency: ongoing since Saturday Disposition: [] ED /[] Urgent Care (no appt availability in office) / [x] Appointment(In office/virtual)/ []  [facility]/ [] Home Care/ [] Refused Recommended Disposition /[] [facility]/ []  Follow-up with PCP Additional Notes: The patient was discharged from the hospital yesterday newly diagnosed with diabetes.  At 5 am, her blood sugar was >600.  She rechecked it while on the phone with nurse triage and it was down to 343.  She said her feet are swollen and she has been urinating frequently.  The patient realized that she thought she gave herself insulin but actually did not because she did not take the cap off of the needle and did not receive the dose.  She has an appointment Thursday 10/29/23 with Dr. Abner Greenspan and requested an earlier appointment.  Called the [facility] and spoke to [place] who checked Dr. Mariel Aloe schedule and there is no earlier availability. The patient was scheduled for an appointment at 2:20 for a hospital follow up with a different provider in the office.  The patient stated that she took her morning dose of insulin and she feels tired and her stomach hurts.  She rechecked her blood sugar and it was down to 320.  She received a call stating that Dr. Abner Greenspan would call her at 1000 and to also keep her  2:20 pm appointment today.    Reason for Disposition  [1] Blood glucose > 300 mg/dL (04.5 mmol/L) AND [4] two or more times in a row  Answer Assessment - Initial Assessment Questions 1. BLOOD GLUCOSE: "What is your blood glucose level?"      343 most recent recent  >600 5 am  2. ONSET: "When did you check the blood glucose?"     0828 3. USUAL RANGE: "What is your glucose level usually?" (e.g., usual fasting morning value, usual evening value)     Diagnosed Saturday A1C is 12.1 4. KETONES: "Do you check for ketones (urine or blood test strips)?" If Yes, ask: "What does the test show now?"      Saturday was in DKA when admitted to hospital  5. TYPE 1 or 2:  "Do you know what type of diabetes you have?"  (e.g., Type 1, Type 2, Gestational; doesn't know)      Type 2  6. INSULIN: "Do you take insulin?" "What type of insulin(s) do you use? What is the mode of delivery? (syringe, pen; injection or pump)?"      Novolog  7. DIABETES PILLS: "Do you take any pills for your diabetes?" If Yes, ask: "Have you missed taking any pills recently?"     None  8. OTHER SYMPTOMS: "Do you have any symptoms?" (e.g., fever, frequent urination, difficulty breathing, dizziness, weakness, vomiting)     Feet swollen  Protocols used: Diabetes - High Blood Sugar-A-AH

## 2023-10-20 NOTE — Telephone Encounter (Signed)
Called [facility] on [address]. Pharmacist said prescription was picked up on 10/15/23

## 2023-10-20 NOTE — Progress Notes (Signed)
Virtual Visit via Video Note   This patient is at least at moderate risk for complications without adequate follow up. This format is felt to be most appropriate for this patient at this time. Physical exam was limited by quality of the video and audio technology used for the visit. [name], CMA was able to get the patient set up on a video visit.  Patient location: home Patient and provider in visit Provider location: Office  I discussed the limitations of evaluation and management by telemedicine and the availability of in person appointments. The patient expressed understanding and agreed to proceed.  Visit Date: 10/20/2023  Today's healthcare provider: Danise Edge, MD  Subjective:    Patient ID: Melissa [name], female    DOB: 22-Jul-1991, 33 y.o.   MRN: [id number]  Chief Complaint  Patient presents with   Follow-up    Hospital    HPI Discussed the use of AI scribe software for clinical note transcription with the patient, who gave verbal consent to proceed.  History of Present Illness   The patient, recently diagnosed with diabetes, presents with persistently high blood sugar levels. She reports that her sugars have been running high, with the lowest reading being 141 in the hospital and the current reading being 244, has not been below 200 since returning home. She has started insulin therapy with Novolog and Semglee, taking 7 units of Novolog and 20 units of Semglee. She has not been eating due to nausea, for which she has been taking Zofran. The patient also mentions that she has not been taking her Ritalin recently. She has a history of PCOS and expresses concerns about weight gain with certain foods, such as nuts. She also expresses a desire to learn more about managing her diabetes and is open to the idea of using a weekly shot to help with weight loss and glucose regulation.        Past Medical History:  Diagnosis Date   Abnormal Pap smear of cervix 07/11/2011   Acid  reflux    Anxiety    Asthma    activity induced   Diabetes mellitus without complication (HCC)    Hip abductor tendinitis 01/23/2015   Irregular menses    Polycystic ovarian syndrome     Past Surgical History:  Procedure Laterality Date   NO PAST SURGERIES      Family History  Problem Relation Age of Onset   Gallbladder disease Mother    Colon polyps Mother    Hypertension Father    Other Father        Hepatitis A   Diabetes Father    Drug abuse Father    Post-traumatic stress disorder Father    Stomach cancer Maternal Grandmother    Diabetes Maternal Grandmother    Colon polyps Maternal Grandmother    Colon cancer Maternal Grandmother    Cancer Maternal Grandmother        stomach cancer   Alcohol abuse Maternal Grandmother    Emphysema Maternal Grandfather    Cancer Maternal Grandfather        smoke, lung cancer   Alcohol abuse Maternal Grandfather    Heart disease Paternal Grandfather    Alcohol abuse Paternal Grandfather    Breast cancer Other        mat great aunts x 2   Colon cancer Other        mat great uncles x 2   Irritable bowel syndrome Sister    Multiple sclerosis Sister  Bipolar disorder Sister    Colon polyps Paternal Uncle    Alcohol abuse Paternal Grandmother    Pancreatic cancer Neg Hx    Esophageal cancer Neg Hx     Social History   Socioeconomic History   Marital status: Married    Spouse name: Not on file   Number of children: 0   Years of education: Not on file   Highest education level: Associate degree: occupational, Scientist, product/process development, or vocational program  Occupational History   Occupation: Dentist: [employer]   Tobacco Use   Smoking status: Never   Smokeless tobacco: Never  Vaping Use   Vaping status: Never Used  Substance and Sexual Activity   Alcohol use: Not Currently    Alcohol/week: 4.0 - 7.0 standard drinks of alcohol    Types: 4 - 7 Cans of beer per week    Comment: 1 beer every other ay   Drug use:  Not Currently    Types: Marijuana    Comment: edible   Sexual activity: Yes    Partners: Male    Birth control/protection: None  Other Topics Concern   Not on file  Social History Narrative   Caffeine use:  1 can soda daily   Regular exercise:  Just started   2 sisters.      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    Social Drivers of Corporate investment banker Strain: High Risk (08/17/2023)   Overall Financial Resource Strain (CARDIA)    Difficulty of Paying Living Expenses: Hard  Food Insecurity: No Food Insecurity (10/18/2023)   Hunger Vital Sign    Worried About Running Out of Food in the Last Year: Never true    Ran Out of Food in the Last Year: Never true  Recent Concern: Food Insecurity - Food Insecurity Present (08/17/2023)   Hunger Vital Sign    Worried About Running Out of Food in the Last Year: Sometimes true    Ran Out of Food in the Last Year: Sometimes true  Transportation Needs: No Transportation Needs (10/18/2023)   PRAPARE - Administrator, Civil Service (Medical): No    Lack of Transportation (Non-Medical): No  Recent Concern: Transportation Needs - Unmet Transportation Needs (08/17/2023)   PRAPARE - Transportation    Lack of Transportation (Medical): Yes    Lack of Transportation (Non-Medical): Yes  Physical Activity: Unknown (08/17/2023)   Exercise Vital Sign    Days of Exercise per Week: 0 days    Minutes of Exercise per Session: Not on file  Stress: Stress Concern Present (08/17/2023)   [employer] of Occupational Health - Occupational Stress Questionnaire    Feeling of Stress : Very much  Social Connections: Moderately Integrated (08/17/2023)   Social Connection and Isolation Panel [NHANES]    Frequency of Communication with Friends and Family: More than three times a week    Frequency  of Social Gatherings with Friends and Family: Never    Attends Religious Services: More than 4 times per year    Active Member of [employer] or Organizations: No    Attends Banker Meetings: Not on file    Marital Status: Married  Intimate Partner Violence: Not At Risk (10/18/2023)   Humiliation, Afraid, Rape, and Kick questionnaire    Fear  of Current or Ex-Partner: No    Emotionally Abused: No    Physically Abused: No    Sexually Abused: No    Outpatient Medications Prior to Visit  Medication Sig Dispense Refill   albuterol (VENTOLIN HFA) 108 (90 Base) MCG/ACT inhaler Inhale 2 puffs into the lungs every 6 (six) hours as needed for wheezing or shortness of breath. 8 g 1   aspirin-acetaminophen-caffeine (EXCEDRIN MIGRAINE) 250-250-65 MG tablet Take 3 tablets by mouth every 6 (six) hours as needed for headache.     Blood Glucose Monitoring Suppl (BLOOD GLUCOSE MONITOR SYSTEM) w/Device KIT Use to check blood sugar as directed 1 kit 0   busPIRone (BUSPAR) 30 MG tablet Take 1 tablet (30 mg total) by mouth 2 (two) times daily. 60 tablet 2   clonazePAM (KLONOPIN) 1 MG tablet Take 1 tablet by mouth twice daily as needed for anxiety 40 tablet 1   fluconazole (DIFLUCAN) 150 MG tablet Take 1 tablet (150 mg total) by mouth daily for 5 days. 5 tablet 0   gabapentin (NEURONTIN) 300 MG capsule Take 1 capsule (300 mg total) by mouth 3 (three) times daily. 90 capsule 1   Glucose Blood (BLOOD GLUCOSE TEST STRIPS) STRP Use to check blood sugar as directed three times daily 100 strip 0   insulin aspart (NOVOLOG) 100 UNIT/ML FlexPen Inject 4 Units into the skin 3 (three) times daily with meals. If eating and Blood Glucose (BG) 80 or higher inject 4 units for meal coverage and add correction dose per scale. If not eating, correction dose only. BG <150= 0 unit; BG 150-200= 1 unit; BG 201-250= 3 unit; BG 251-300= 5 unit; BG 301-350= 7 unit; BG 351-400= 9 unit; BG >400= 11 unit and Call Primary Care. 15 mL 0    insulin glargine-yfgn (SEMGLEE) 100 UNIT/ML Pen Inject 20 Units into the skin in the morning. May substitute as needed per insurance. 15 mL 0   Insulin Pen Needle (PEN NEEDLES) 31G X 5 MM MISC Use to inject insulin as directed 100 each 0   Lancet Device MISC 1 each by Does not apply route 3 (three) times daily. May dispense any manufacturer covered by patient's insurance. 1 each 0   Lancets MISC Use to check blood sugar three times daily 100 each 0   methylphenidate (RITALIN) 20 MG tablet Take 1 tablet (20 mg total) by mouth 3 (three) times daily with meals. 90 tablet 0   norgestimate-ethinyl estradiol (ORTHO-CYCLEN) 0.25-35 MG-MCG tablet Take 1 tablet by mouth daily. (Patient not taking: Reported on 10/18/2023) 84 tablet 1   ondansetron (ZOFRAN-ODT) 4 MG disintegrating tablet Take 1 tablet (4 mg total) by mouth every 6 (six) hours as needed for nausea or vomiting. 4mg  ODT q4 hours prn nausea/vomit 30 tablet 0   PARoxetine (PAXIL) 10 MG tablet 20 mg in am po and 10 mg in pm 90 tablet 2   No facility-administered medications prior to visit.    Allergies  Allergen Reactions   Diphenhydramine Hcl Other (See Comments)   Flagyl [Metronidazole] Swelling    Review of Systems  Constitutional:  Positive for malaise/fatigue. Negative for fever.  HENT:  Negative for congestion.   Eyes:  Negative for blurred vision.  Respiratory:  Negative for shortness of breath.   Cardiovascular:  Negative for chest pain, palpitations and leg swelling.  Gastrointestinal:  Positive for nausea. Negative for abdominal pain and blood in stool.  Genitourinary:  Positive for frequency. Negative for dysuria.  Musculoskeletal:  Negative for falls.  Skin:  Negative for rash.  Neurological:  Negative for dizziness, loss of consciousness and headaches.  Endo/Heme/Allergies:  Negative for environmental allergies.  Psychiatric/Behavioral:  Negative for depression. The patient is not nervous/anxious.        Objective:     Physical Exam Constitutional:      General: She is not in acute distress.    Appearance: Normal appearance. She is not ill-appearing or toxic-appearing.  HENT:     Head: Normocephalic and atraumatic.     Right Ear: External ear normal.     Left Ear: External ear normal.     Nose: Nose normal.  Eyes:     General:        Right eye: No discharge.        Left eye: No discharge.  Pulmonary:     Effort: Pulmonary effort is normal.  Skin:    Findings: No rash.  Neurological:     Mental Status: She is alert and oriented to person, place, and time.  Psychiatric:        Behavior: Behavior normal.     BP 112/71 Comment: Pt obtained  Pulse (!) 106 Comment: Pt obtained  Ht 5\' 2"  (1.575 m) Comment: Pt stated  Wt 248 lb (112.5 kg) Comment: Pt stated  LMP 10/04/2023   BMI 45.36 kg/m  Wt Readings from Last 3 Encounters:  10/20/23 248 lb (112.5 kg)  10/18/23 255 lb (115.7 kg)  10/17/23 218 lb 14.7 oz (99.3 kg)    Diabetic Foot Exam - Simple   No data filed    Lab Results  Component Value Date   WBC 6.3 10/19/2023   HGB 12.1 10/19/2023   HCT 38.8 10/19/2023   PLT 313 10/19/2023   GLUCOSE 380 (H) 10/19/2023   CHOL 168 06/25/2022   TRIG 125.0 06/25/2022   HDL 38.70 (L) 06/25/2022   LDLCALC 104 (H) 06/25/2022   ALT 33 10/17/2023   AST 17 10/17/2023   NA 134 (L) 10/19/2023   K 3.9 10/19/2023   CL 101 10/19/2023   CREATININE 0.68 10/19/2023   BUN 11 10/19/2023   CO2 22 10/19/2023   TSH 1.33 06/25/2022   INR 1.1 06/19/2022   HGBA1C 12.1 (H) 10/17/2023    Lab Results  Component Value Date   TSH 1.33 06/25/2022   Lab Results  Component Value Date   WBC 6.3 10/19/2023   HGB 12.1 10/19/2023   HCT 38.8 10/19/2023   MCV 81.2 10/19/2023   PLT 313 10/19/2023   Lab Results  Component Value Date   NA 134 (L) 10/19/2023   K 3.9 10/19/2023   CO2 22 10/19/2023   GLUCOSE 380 (H) 10/19/2023   BUN 11 10/19/2023   CREATININE 0.68 10/19/2023   BILITOT 0.6 10/17/2023    ALKPHOS 86 10/17/2023   AST 17 10/17/2023   ALT 33 10/17/2023   PROT 7.8 10/17/2023   ALBUMIN 4.6 10/17/2023   CALCIUM 8.6 (L) 10/19/2023   ANIONGAP 11 10/19/2023   GFR 113.40 06/25/2022   Lab Results  Component Value Date   CHOL 168 06/25/2022   Lab Results  Component Value Date   HDL 38.70 (L) 06/25/2022   Lab Results  Component Value Date   LDLCALC 104 (H) 06/25/2022   Lab Results  Component Value Date   TRIG 125.0 06/25/2022   Lab Results  Component Value Date   CHOLHDL 4 06/25/2022   Lab Results  Component Value Date   HGBA1C 12.1 (H) 10/17/2023  Assessment & Plan:  Uncontrolled diabetes mellitus of other type with hypoglycemia, unspecified hypoglycemia coma status (HCC) -     Amb ref to Medical Nutrition Therapy-MNT -     Ambulatory referral to Endocrinology    Assessment and Plan    Type 1 Diabetes Mellitus Newly diagnosed with high blood glucose levels. Currently on Novolog and Semglee. Discussed the importance of consistent dosing, meal planning, and regular glucose monitoring. Family history of late-onset diabetes. -Continue Novolog 7 units and Semglee 20 units daily. -Check blood glucose levels before each meal and adjust Novolog dose accordingly. -Increase Semglee by 2 units every 3 days if fasting blood glucose levels remain above 120. -Refer to endocrinologist and diabetic educator for further management. -Consider future addition of Mounjaro for appetite and weight management.  Nausea Possible side effect of high blood glucose levels and/or medication. Currently managed with Zofran. -Continue Zofran as needed for nausea. -Monitor for potential increase in nausea with future addition of Mounjaro.  Ritalin use Currently taking Ritalin, which may be affecting appetite and thus blood glucose control. She is not taking it routinely.  -Consider reducing Ritalin to twice daily to help manage appetite.  Metformin Previously prescribed  Metformin, but not currently listed on medication list. -Hold Metformin for now to avoid potential gastrointestinal side effects.  Follow-up Next appointment scheduled for 10/29/2023. -Plan to discuss further management options, including potential addition of Mounjaro and Metformin. -Continue to monitor blood glucose levels and adjust insulin dosing as needed. -Communicate any concerns or questions via notes before next appointment.         Danise Edge, MD

## 2023-10-20 NOTE — Telephone Encounter (Signed)
That is correct    Disp #90  No refills

## 2023-10-21 ENCOUNTER — Other Ambulatory Visit: Payer: Self-pay | Admitting: Family Medicine

## 2023-10-21 ENCOUNTER — Telehealth: Payer: Self-pay

## 2023-10-21 NOTE — Progress Notes (Signed)
Care Guide Pharmacy Note  10/21/2023 Name: [name] MRN: [id number] DOB: 1990/11/21  Referred By: Bradd Canary, MD Reason for referral: Care Coordination (Outreach to schedule with Pharm d )   Melissa [name] is a 33 y.o. year old female who is a primary care patient of Bradd Canary, MD.  [name] was referred to the pharmacist for assistance related to: DMII  Successful contact was made with the patient to discuss pharmacy services including being ready for the pharmacist to call at least 5 minutes before the scheduled appointment time and to have medication bottles and any blood pressure readings ready for review. The patient agreed to meet with the pharmacist via telephone visit on (date/time).10/27/2023  Penne Lash , RMA     [facility] Guide  Direct Dial: [phone]  Website: [email or web address]

## 2023-10-25 NOTE — Assessment & Plan Note (Signed)
 Tolerating lantus

## 2023-10-25 NOTE — Assessment & Plan Note (Signed)
 Continues to struggle with significant stressors.

## 2023-10-25 NOTE — Assessment & Plan Note (Signed)
 Supplement and monitor

## 2023-10-25 NOTE — Assessment & Plan Note (Signed)
 Encourage heart healthy diet such as MIND or DASH diet, increase exercise, avoid trans fats, simple carbohydrates and processed foods, consider a krill or fish or flaxseed oil cap daily.

## 2023-10-26 ENCOUNTER — Other Ambulatory Visit: Payer: Self-pay | Admitting: Family Medicine

## 2023-10-26 MED ORDER — METFORMIN HCL 500 MG PO TABS: 500.0000 mg | ORAL_TABLET | Freq: Every day | ORAL | 3 refills | Status: DC

## 2023-10-26 MED ORDER — CLONAZEPAM 1 MG PO TABS: 1.0000 mg | ORAL_TABLET | Freq: Two times a day (BID) | ORAL | 2 refills | Status: DC | PRN

## 2023-10-27 ENCOUNTER — Encounter: Payer: Self-pay | Admitting: Pharmacist

## 2023-10-27 ENCOUNTER — Other Ambulatory Visit: Payer: Self-pay | Admitting: Pharmacist

## 2023-10-27 ENCOUNTER — Other Ambulatory Visit (HOSPITAL_BASED_OUTPATIENT_CLINIC_OR_DEPARTMENT_OTHER): Payer: Self-pay

## 2023-10-27 DIAGNOSIS — E118 Type 2 diabetes mellitus with unspecified complications: Secondary | ICD-10-CM

## 2023-10-27 DIAGNOSIS — Z794 Long term (current) use of insulin: Secondary | ICD-10-CM

## 2023-10-27 MED ORDER — DEXCOM G7 SENSOR MISC
0 refills | Status: DC
  Filled 2023-10-27: qty 1, 10d supply, fill #0

## 2023-10-27 MED ORDER — METFORMIN HCL ER 500 MG PO TB24
ORAL_TABLET | ORAL | 1 refills | Status: DC
  Filled 2023-10-27: qty 60, 30d supply, fill #0
  Filled 2023-11-23 (×2): qty 60, 4d supply, fill #1

## 2023-10-27 NOTE — Progress Notes (Signed)
 10/27/2023 Name: Melissa [name] MRN: [id number] DOB: Jan 08, 1991  Chief Complaint  Patient presents with   Medication Management   Diabetes    Melissa [name] is a 33 y.o. year old female who presented for a telephone visit. 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    They were referred to the pharmacist by their PCP for assistance in managing diabetes and medication access.    Subjective: Patient has recently been seen in ED 10/17/2023 and then hospitalized 02/162025 to 10/19/2023 for uncontrolled DM. She is newly diagnosed type 2 DM 10/17/2023.  She was started on metformin at discharge form ED 2/15 but did not get blood glucose down enough and due to continued symptoms she was admitted to hospital. At discharge she was started on Semglee and Novolog insulin.  Currently Melissa [name] does not have insurance coverage but hopes to be added to her husbands policy soon.  She has used GoodRx card in that past to lower medications costs. She was also provided insulin at discharge from our [facility] pharmacy for $9. She thinks she has 2 pens of Semglee  and  2 pens of Novolog remaining but she was not at home to check.   Care Team: Primary Care Provider: Bradd Canary, MD ; Next Scheduled Visit: 10/29/2023 Referred to Endocrinology 02/18 but no appointment set yet Will attend diabetes classes 3/4, 3/11 and 3/18 and appointment with dietician 02/16/2024  Medication Access/Adherence  Current Pharmacy:  [id number] [place],  [place] - [address] [place] [address] Phone: [phone] Fax: [phone]  [facility] - [place], [place] - [address] [place] [address] Phone: [phone] Fax: [phone]  [place] - [facility] [address] [place] [address] Phone: [phone] Fax: [phone]  [facility] - [facility] [address] [place] [address] Phone: [phone] Fax: [phone]   Patient reports affordability concerns with their medications: Yes  Patient reports access/transportation concerns to their pharmacy: No  Patient reports adherence concerns with their medications:  Yes  - Melissa [name] is not sure she is taking Novolog correctly    Diabetes:  Current medications:  Metformin 500mg  once a day (just restarted - she has stopped due to nausea but blood glucose increased to 600, has improved to 130 to 200's since she restarted metformin) Semglee 24 units once a day (instructed to increase 2 units every 3 days until blood glucose in morning is 120 to 150),  Novolong insulin: She has been taking Novolog 4 units 3 times a day around 8am, 1pm and 7pm. She was not clear on the correction dosing for Novolog. Novolog directions are taking 4 units prior to meals and add additional insulin to correct blood glucose based on sliding scale.  If eating and Blood Glucose (BG) 80 or higher inject 4 units for meal coverage and add correction dose per scale.  If not eating, correction dose only.  BG <150= 0 unit;  BG 150-200= 1 unit;  BG 201-250= 3 unit;  BG 251-300= 5 unit;  BG 301-350= 7 unit;  BG 351-400= 9 unit;  BG >400= 11 unit and Call Primary Care.   Medications tried in the past: none  Current glucose readings: ranges from 130's to 200's  She is using Accu-Chek glucometer but was using Libre Continuous Glucose Monitor. She removed the  Libre sensor last night because she was locked out of her phone for 24 hours and she was not able to see readings. She also reports that Continuous Glucose Monitor readings did not match up to fingerstick readings. Once Continuous Glucose Monitor was 69 and fingerstick was 196.   Unable to see Continuous Glucose Monitor report.    Patient denies hypoglycemic s/sx including no dizziness, shakiness, sweating. Patient  reports hyperglycemic symptoms including nocturia, neuropathy.  She continues to have some nausea.   Current meal patterns:  When she is working can be variable. Recently she was bee eating breakfast and evening meal but not usually eating around noon.    Current medication access support: none   Objective:  Lab Results  Component Value Date   HGBA1C 12.1 (H) 10/17/2023    Lab Results  Component Value Date   CREATININE 0.68 10/19/2023   BUN 11 10/19/2023   NA 134 (L) 10/19/2023   K 3.9 10/19/2023   CL 101 10/19/2023   CO2 22 10/19/2023    Lab Results  Component Value Date   CHOL 168 06/25/2022   HDL 38.70 (L) 06/25/2022   LDLCALC 104 (H) 06/25/2022   TRIG 125.0 06/25/2022   CHOLHDL 4 06/25/2022    Medications Reviewed Today     Reviewed by Henrene Pastor, RPH-CPP (Pharmacist) on 10/27/23 at [phone]  Med List Status: <None>   Medication Order Taking? Sig Documenting Provider Last Dose Status Informant  albuterol (VENTOLIN HFA) 108 (90 Base) MCG/ACT inhaler [id number]  Inhale 2 puffs into the lungs every 6 (six) hours as needed for wheezing or shortness of breath. Sandford Craze, NP  Active Self, Pharmacy Records           Med Note [facility]   Tue Oct 27, 2023  9:22 AM)    aspirin-acetaminophen-caffeine [facility] MIGRAINE) [phone] MG tablet [id number]  Take 3 tablets by mouth every 6 (six) hours as needed for headache. [provider]  Active Self, Pharmacy Records  Blood Glucose Monitoring Suppl (BLOOD GLUCOSE MONITOR SYSTEM) w/Device KIT [id number]  Use to check blood sugar as directed Carollee Herter, DO  Active   busPIRone (BUSPAR) 30 MG tablet [id number]  Take 1 tablet (30 mg total) by mouth 2 (two) times daily. Bradd Canary, MD  Active Self, Pharmacy Records  clonazePAM [facility]) 1 MG tablet [id number]  Take 1 tablet (1 mg total) by mouth 2 (two) times daily as needed. for anxiety Bradd Canary, MD  Active   gabapentin (NEURONTIN) 300 MG capsule  [id number]  TAKE 1 CAPSULE BY MOUTH THREE TIMES DAILY Bradd Canary, MD  Active   Glucose Blood (BLOOD GLUCOSE TEST STRIPS) STRP [id number] Yes Use to check blood sugar as directed three times daily Carollee Herter, DO Taking Active   insulin aspart (NOVOLOG) 100 UNIT/ML FlexPen [id number] Yes Inject 4 Units into the skin 3 (three) times daily with meals. If eating and Blood Glucose (BG) 80 or higher inject 4 units for meal coverage and add correction dose per scale. If not eating, correction dose only. BG <150= 0 unit; BG 150-200= 1 unit; BG 201-250= 3 unit; BG 251-300= 5 unit; BG 301-350= 7 unit; BG 351-400= 9 unit; BG >400= 11 unit and Call Primary Care. Carollee Herter, DO Taking Active   insulin glargine-yfgn (SEMGLEE) 100 UNIT/ML Pen [id number] Yes Inject 20 Units into the skin in the morning. May substitute as needed per insurance.  Patient taking differently: Inject 24 Units into the skin in the  morning. May substitute as needed per insurance.   Carollee Herter, DO Taking Active   Insulin Pen Needle (PEN NEEDLES) 31G X 5 MM MISC [id number] Yes Use to inject insulin as directed Carollee Herter, DO Taking Active   Lancet Device MISC [id number] Yes 1 each by Does not apply route 3 (three) times daily. May dispense any manufacturer covered by patient's insurance. Carollee Herter, DO Taking Active   Lancets MISC [id number] Yes Use to check blood sugar three times daily Carollee Herter, DO Taking Active   metFORMIN (GLUCOPHAGE) 500 MG tablet [id number] Yes Take 1 tablet (500 mg total) by mouth daily with breakfast. Bradd Canary, MD Taking Active   methylphenidate (RITALIN) 20 MG tablet [id number]  Take 1 tablet (20 mg total) by mouth 3 (three) times daily with meals. Eulis Foster, FNP  Active Self, Pharmacy Records  norgestimate-ethinyl estradiol (ORTHO-CYCLEN) 0.25-35 MG-MCG tablet [id number]  Take 1 tablet by mouth daily.  Patient not taking: Reported on 10/18/2023   Bradd Canary, MD  Active Self, Pharmacy Records  ondansetron  (ZOFRAN-ODT) 4 MG disintegrating tablet [id number]  Take 1 tablet (4 mg total) by mouth every 6 (six) hours as needed for nausea or vomiting. 4mg  ODT q4 hours prn nausea/vomit Carollee Herter, DO  Active   PARoxetine (PAXIL) 10 MG tablet [id number]  TAKE 2 TABLETS BY MOUTH IN THE MORNING AND 1 IN THE EVENING Bradd Canary, MD  Active               Assessment/Plan:   Diabetes: newly diagnoses DM, not currently at goals.  - Reviewed goal A1c, goal fasting, and goal 2 hour post prandial glucose. Discussed that Continuous Glucose Monitor and fingerstick blood glucose number can vary depending on if blood glucose is increasing or decreasing quickly.  - Reviewed that Continuous Glucose Monitor will alert her when to check blood glucose with fingerstick when sensor senses that blood glucose is changing too quickly for it to be accurate. For the Freestyle this will show as a magnifying glass with blood drop.  - Reviewed dietary modifications not discussed today due time restraint (patient had to be at work), plan to discuss at future appoitnment.  - Recommend to increase Semglee to 26 units daily starting tomorrow.  - Discussed how to determine dose of Novolog - 4 units is her base amount to use if she is eating. Explained that correction if the amount that will lower her blood glucose is it is above 150. She can add correction amount onto the based 4 units before meals or if she is not eating (like at noon she can just use correction amount if blood glucose is > 150).  - Changed metformin to ER for to see if better tolerated - take metformin ER 500mg  daily for 1 week, then try to increase to 2 tablets daily. (Cost is $5 per 30 days at [facility])  Meds ordered this encounter  Medications   Continuous Glucose Sensor (DEXCOM G7 SENSOR) MISC    Sig: Use to check blood glucose continuously and assist in administration of short acting insulin / Novolog.    Dispense:  1 each    Refill:  0    No  insurance current - pt to bring in free trial coupon for 1 sensor.   metFORMIN (GLUCOPHAGE-XR) 500 MG 24 hr tablet    Sig: Take 1 tablet (500 mg total) by mouth daily for 7 days, THEN 2 tablets (1,000 mg total) daily. Take with  largest meal of the day. *Replaces regular release metformin.*    Dispense:  60 tablet    Refill:  1    Please use Drug Discount List pricing - patient does not currently have insurance.    - We could add GLP1 agent, however [place] dose not currently have an assistance program so would need to hold off until we verify if her insurance once instated will cover [place]. We could consider Ozempic since it would be available if patient were approved for Novolog medication assistance program with [employer]. Trulicity would also be an option - could get thru [facility] medication assistance program.  - Regarding Semglee - we can either try for Basaglar or Lantus medication assistance program or consider change to [place] and get thru [employer] medication assistance program   So medication assistance program options:   [employer] - [place], [place] and [employer] - Trulicity, Hospital doctor and Humalog.    - Recommend to check glucose 3 to 4 times a day. I sent in Rx for Dex Com 7 since patient has a coupon for 1 free sensor. Will supply with samples of either Dex Com 7 or Libre 3 plus depending which she prefers and once she has insurance which is preferred by insurance.    Follow Up Plan: 2 to 3 days  Henrene Pastor, PharmD Clinical Pharmacist [facility]  Population Health [phone]

## 2023-10-27 NOTE — Patient Instructions (Signed)
 Why BGM and CGM readings are sometimes different  Comparing BGM and CGM readings  Have you ever compared your blood glucose meter (BGM) and Dexcom G7 Continuous Glucose Monitoring (CGM) System readings and wondered why they're not the same? One of the main reasons the two may differ is that a BGM measures glucose in blood, while Dexcom G7 measures glucose in interstitial fluid (a fluid just below the surface of the skin).  Here are some other common reasons why your BGM and CGM readings could differ: Hand cleanliness: Many inaccurate BGM values are caused by hands not being clean. Wash your hands with soap and water (and dry them) right before testing.  Sensor's first day: The difference between your BGM and CGM readings may be greater when you insert a new sensor. Generally, the readings get closer over the first 24 hours.  Pressure on your CGM sensor: When something is pressing on your sensor, it can affect your readings. Relieve the pressure and your BGM and CGM readings should get closer.  Rapid glucose change: It can be tricky to compare your BGM and CGM readings when your glucose is changing quickly, since blood glucose changes before interstitial fluid glucose does. The match should get closer when your glucose stabilizes.  Test strips: If test strips aren't stored properly or are expired, they may not work correctly. Make sure your test strips are stored as directed and not expired, and that you use enough blood when testing

## 2023-10-29 ENCOUNTER — Other Ambulatory Visit (HOSPITAL_BASED_OUTPATIENT_CLINIC_OR_DEPARTMENT_OTHER): Payer: Self-pay

## 2023-10-29 ENCOUNTER — Other Ambulatory Visit (HOSPITAL_COMMUNITY): Payer: Self-pay

## 2023-10-29 ENCOUNTER — Telehealth: Payer: Self-pay | Admitting: Emergency Medicine

## 2023-10-29 ENCOUNTER — Encounter: Payer: Self-pay | Admitting: Family Medicine

## 2023-10-29 ENCOUNTER — Ambulatory Visit: Payer: BLUE CROSS/BLUE SHIELD | Admitting: Family Medicine

## 2023-10-29 VITALS — BP 136/84 | HR 120 | Temp 98.2°F | Resp 22 | Ht 62.0 in | Wt 252.4 lb

## 2023-10-29 DIAGNOSIS — H538 Other visual disturbances: Secondary | ICD-10-CM | POA: Insufficient documentation

## 2023-10-29 DIAGNOSIS — E559 Vitamin D deficiency, unspecified: Secondary | ICD-10-CM | POA: Diagnosis not present

## 2023-10-29 DIAGNOSIS — N926 Irregular menstruation, unspecified: Secondary | ICD-10-CM

## 2023-10-29 DIAGNOSIS — Z30011 Encounter for initial prescription of contraceptive pills: Secondary | ICD-10-CM

## 2023-10-29 DIAGNOSIS — E785 Hyperlipidemia, unspecified: Secondary | ICD-10-CM | POA: Diagnosis not present

## 2023-10-29 DIAGNOSIS — N921 Excessive and frequent menstruation with irregular cycle: Secondary | ICD-10-CM

## 2023-10-29 DIAGNOSIS — F320 Major depressive disorder, single episode, mild: Secondary | ICD-10-CM

## 2023-10-29 DIAGNOSIS — F909 Attention-deficit hyperactivity disorder, unspecified type: Secondary | ICD-10-CM

## 2023-10-29 DIAGNOSIS — Z7985 Long-term (current) use of injectable non-insulin antidiabetic drugs: Secondary | ICD-10-CM

## 2023-10-29 DIAGNOSIS — E1165 Type 2 diabetes mellitus with hyperglycemia: Secondary | ICD-10-CM

## 2023-10-29 MED ORDER — INSULIN GLARGINE-YFGN 100 UNIT/ML ~~LOC~~ SOPN
26.0000 [IU] | PEN_INJECTOR | Freq: Every morning | SUBCUTANEOUS | 5 refills | Status: DC
  Filled 2023-10-29: qty 9, 34d supply, fill #0

## 2023-10-29 MED ORDER — INSULIN LISPRO (1 UNIT DIAL) 100 UNIT/ML (KWIKPEN)
4.0000 [IU] | PEN_INJECTOR | Freq: Three times a day (TID) | SUBCUTANEOUS | 5 refills | Status: DC
  Filled 2023-10-29: qty 15, 45d supply, fill #0

## 2023-10-29 MED ORDER — NORGESTIMATE-ETH ESTRADIOL 0.25-35 MG-MCG PO TABS
1.0000 | ORAL_TABLET | Freq: Every day | ORAL | 1 refills | Status: AC
  Filled 2023-10-29: qty 84, 84d supply, fill #0
  Filled 2023-11-04: qty 28, 28d supply, fill #0
  Filled 2023-12-28: qty 28, 28d supply, fill #1
  Filled 2024-02-03 – 2024-04-12 (×4): qty 28, 28d supply, fill #2

## 2023-10-29 MED ORDER — ONDANSETRON 4 MG PO TBDP
4.0000 mg | ORAL_TABLET | Freq: Four times a day (QID) | ORAL | 3 refills | Status: DC | PRN
  Filled 2023-10-29 – 2023-11-04 (×2): qty 30, 8d supply, fill #0
  Filled 2023-12-12: qty 30, 8d supply, fill #1

## 2023-10-29 MED ORDER — OZEMPIC (0.25 OR 0.5 MG/DOSE) 2 MG/3ML ~~LOC~~ SOPN
0.2500 mg | PEN_INJECTOR | SUBCUTANEOUS | 2 refills | Status: DC
  Filled 2023-10-29: qty 3, 56d supply, fill #0

## 2023-10-29 NOTE — Telephone Encounter (Signed)
 Copied from CRM [id number]. Topic: Clinical - Medication Question >> Oct 29, 2023  4:25 PM Melissa [name] wrote: Reason for CRM: Patient states that the pharmacy downstairs doesn't cover insulin glargine-yfgn (SEMGLEE) 100 UNIT/ML Pen, insulin lispro (HUMALOG KWIKPEN) 100 UNIT/ML KwikPen,  norgestimate-ethinyl estradiol (ORTHO-CYCLEN) 0.25-35 MG-MCG tablet, ondansetron (ZOFRAN-ODT) 4 MG disintegrating tablet. She states that she need her pharmacy changed and prescriptions sent to [place] on [address].

## 2023-10-29 NOTE — Assessment & Plan Note (Signed)
 Encouraged to minimize carbohydrate intake and referred to ophthalmology for evaluation

## 2023-10-29 NOTE — Progress Notes (Signed)
 Subjective:    Patient ID: Melissa [name], female    DOB: [birth year], 33 y.o.   MRN: [id number]  Chief Complaint  Patient presents with   Follow-up    HPI Discussed the use of AI scribe software for clinical note transcription with the patient, who gave verbal consent to proceed.  History of Present Illness Patient is 33 yo female in today for follow up after hospitalization for new onset and uncontrolled diabetes. She does not have her Dexcom glucose CGM  but is willing to pick up. She notes she ate too many carbs over last 24 hours with white rice and biscuits. She spiked to over 400 but is down to 172 in the office. She is still struggling with fatigue, polyuria, headache and blurry vision. She has to head to work now. She is tolerating the Semglee and Novolog sliding scale. No chest pain, palp, sob, GI or GU concerns.     Past Medical History:  Diagnosis Date   Abnormal Pap smear of cervix 07/11/2011   Acid reflux    Anxiety    Asthma    activity induced   Diabetes mellitus without complication (HCC)    Hip abductor tendinitis 01/23/2015   Irregular menses    Polycystic ovarian syndrome     Past Surgical History:  Procedure Laterality Date   NO PAST SURGERIES      Family History  Problem Relation Age of Onset   Gallbladder disease Mother    Colon polyps Mother    Hypertension Father    Other Father        Hepatitis A   Diabetes Father    Drug abuse Father    Post-traumatic stress disorder Father    Stomach cancer Maternal Grandmother    Diabetes Maternal Grandmother    Colon polyps Maternal Grandmother    Colon cancer Maternal Grandmother    Cancer Maternal Grandmother        stomach cancer   Alcohol abuse Maternal Grandmother    Emphysema Maternal Grandfather    Cancer Maternal Grandfather        smoke, lung cancer   Alcohol abuse Maternal Grandfather    Heart disease Paternal Grandfather    Alcohol abuse Paternal Grandfather    Breast cancer Other         mat great aunts x 2   Colon cancer Other        mat great uncles x 2   Irritable bowel syndrome Sister    Multiple sclerosis Sister    Bipolar disorder Sister    Colon polyps Paternal Uncle    Alcohol abuse Paternal Grandmother    Pancreatic cancer Neg Hx    Esophageal cancer Neg Hx     Social History   Socioeconomic History   Marital status: Married    Spouse name: Not on file   Number of children: 0   Years of education: Not on file   Highest education level: Associate degree: occupational, Scientist, product/process development, or vocational program  Occupational History   Occupation: Dentist: [employer]   Tobacco Use   Smoking status: Never   Smokeless tobacco: Never  Vaping Use   Vaping status: Never Used  Substance and Sexual Activity   Alcohol use: Not Currently    Alcohol/week: 4.0 - 7.0 standard drinks of alcohol    Types: 4 - 7 Cans of beer per week    Comment: 1 beer every other ay   Drug use: Not Currently  Types: Marijuana    Comment: edible   Sexual activity: Yes    Partners: Male    Birth control/protection: None  Other Topics Concern   Not on file  Social History Narrative   Caffeine use:  1 can soda daily   Regular exercise:  Just started   2 sisters.      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    Social Drivers of Corporate investment banker Strain: High Risk (08/17/2023)   Overall Financial Resource Strain (CARDIA)    Difficulty of Paying Living Expenses: Hard  Food Insecurity: No Food Insecurity (10/18/2023)   Hunger Vital Sign    Worried About Running Out of Food in the Last Year: Never true    Ran Out of Food in the Last Year: Never true  Recent Concern: Food Insecurity - Food Insecurity Present (08/17/2023)   Hunger Vital Sign    Worried About Running Out of Food in the Last Year: Sometimes  true    Ran Out of Food in the Last Year: Sometimes true  Transportation Needs: No Transportation Needs (10/18/2023)   PRAPARE - Administrator, Civil Service (Medical): No    Lack of Transportation (Non-Medical): No  Recent Concern: Transportation Needs - Unmet Transportation Needs (08/17/2023)   PRAPARE - Transportation    Lack of Transportation (Medical): Yes    Lack of Transportation (Non-Medical): Yes  Physical Activity: Unknown (08/17/2023)   Exercise Vital Sign    Days of Exercise per Week: 0 days    Minutes of Exercise per Session: Not on file  Stress: Stress Concern Present (08/17/2023)   [employer] of Occupational Health - Occupational Stress Questionnaire    Feeling of Stress : Very much  Social Connections: Moderately Integrated (08/17/2023)   Social Connection and Isolation Panel [NHANES]    Frequency of Communication with Friends and Family: More than three times a week    Frequency of Social Gatherings with Friends and Family: Never    Attends Religious Services: More than 4 times per year    Active Member of [employer] or Organizations: No    Attends Engineer, structural: Not on file    Marital Status: Married  Catering manager Violence: Not At Risk (10/18/2023)   Humiliation, Afraid, Rape, and Kick questionnaire    Fear of Current or Ex-Partner: No    Emotionally Abused: No    Physically Abused: No    Sexually Abused: No    Outpatient Medications Prior to Visit  Medication Sig Dispense Refill   albuterol (VENTOLIN HFA) 108 (90 Base) MCG/ACT inhaler Inhale 2 puffs into the lungs every 6 (six) hours as needed for wheezing or shortness of breath. 8 g 1   aspirin-acetaminophen-caffeine (EXCEDRIN MIGRAINE) 250-250-65 MG tablet Take 3 tablets by mouth every 6 (six) hours as needed for headache.     Blood Glucose Monitoring Suppl (BLOOD GLUCOSE MONITOR SYSTEM) w/Device KIT Use to check blood sugar as directed 1 kit 0   busPIRone (BUSPAR) 30 MG  tablet Take 1 tablet (30 mg total) by mouth 2 (two) times daily. 60 tablet 2   clonazePAM (KLONOPIN) 1 MG tablet Take 1 tablet (1 mg total) by mouth 2 (two) times daily as needed. for anxiety 60 tablet 2   Continuous Glucose Sensor (DEXCOM G7 SENSOR)  MISC Use to check blood glucose continuously and assist in administration of short acting insulin / Novolog. 1 each 0   gabapentin (NEURONTIN) 300 MG capsule TAKE 1 CAPSULE BY MOUTH THREE TIMES DAILY 270 capsule 0   Glucose Blood (BLOOD GLUCOSE TEST STRIPS) STRP Use to check blood sugar as directed three times daily 100 strip 0   Insulin Pen Needle (PEN NEEDLES) 31G X 5 MM MISC Use to inject insulin as directed 100 each 0   Lancet Device MISC 1 each by Does not apply route 3 (three) times daily. May dispense any manufacturer covered by patient's insurance. 1 each 0   Lancets MISC Use to check blood sugar three times daily 100 each 0   metFORMIN (GLUCOPHAGE-XR) 500 MG 24 hr tablet Take 1 tablet (500 mg total) by mouth daily for 7 days, THEN 2 tablets (1,000 mg total) daily. Take with largest meal of the day. *Replaces regular release metformin.* 60 tablet 1   methylphenidate (RITALIN) 20 MG tablet Take 1 tablet (20 mg total) by mouth 3 (three) times daily with meals. 90 tablet 0   PARoxetine (PAXIL) 10 MG tablet TAKE 2 TABLETS BY MOUTH IN THE MORNING AND 1 IN THE EVENING 270 tablet 0   insulin aspart (NOVOLOG) 100 UNIT/ML FlexPen Inject 4 Units into the skin 3 (three) times daily with meals. If eating and Blood Glucose (BG) 80 or higher inject 4 units for meal coverage and add correction dose per scale. If not eating, correction dose only. BG <150= 0 unit; BG 150-200= 1 unit; BG 201-250= 3 unit; BG 251-300= 5 unit; BG 301-350= 7 unit; BG 351-400= 9 unit; BG >400= 11 unit and Call Primary Care. 15 mL 0   insulin glargine-yfgn (SEMGLEE) 100 UNIT/ML Pen Inject 26 Units into the skin in the morning. May substitute as needed per insurance.      norgestimate-ethinyl estradiol (ORTHO-CYCLEN) 0.25-35 MG-MCG tablet Take 1 tablet by mouth daily. 84 tablet 1   ondansetron (ZOFRAN-ODT) 4 MG disintegrating tablet Take 1 tablet (4 mg total) by mouth every 6 (six) hours as needed for nausea or vomiting. 4mg  ODT q4 hours prn nausea/vomit 30 tablet 0   No facility-administered medications prior to visit.    Allergies  Allergen Reactions   Diphenhydramine Hcl Other (See Comments)   Flagyl [Metronidazole] Swelling    Review of Systems  Constitutional:  Positive for malaise/fatigue. Negative for fever.  HENT:  Negative for congestion.   Eyes:  Positive for blurred vision.  Respiratory:  Negative for shortness of breath.   Cardiovascular:  Negative for chest pain, palpitations and leg swelling.  Gastrointestinal:  Negative for abdominal pain, blood in stool and nausea.  Genitourinary:  Negative for dysuria.  Musculoskeletal:  Negative for falls.  Skin:  Negative for rash.  Neurological:  Positive for headaches. Negative for dizziness and loss of consciousness.  Endo/Heme/Allergies:  Negative for environmental allergies.  Psychiatric/Behavioral:  Negative for depression. The patient is not nervous/anxious.        Objective:    Physical Exam Vitals reviewed.  Constitutional:      General: She is not in acute distress.    Appearance: Normal appearance. She is well-developed. She is not toxic-appearing.  HENT:     Head: Normocephalic and atraumatic.     Right Ear: External ear normal.     Left Ear: External ear normal.     Nose: Nose normal.  Eyes:     General:  Right eye: No discharge.        Left eye: No discharge.     Conjunctiva/sclera: Conjunctivae normal.  Neck:     Thyroid: No thyromegaly.  Cardiovascular:     Rate and Rhythm: Normal rate and regular rhythm.     Heart sounds: Normal heart sounds. No murmur heard. Pulmonary:     Effort: Pulmonary effort is normal. No respiratory distress.     Breath sounds:  Normal breath sounds.  Abdominal:     General: Bowel sounds are normal.     Palpations: Abdomen is soft.     Tenderness: There is no abdominal tenderness. There is no guarding.  Musculoskeletal:        General: Normal range of motion.     Cervical back: Neck supple.  Lymphadenopathy:     Cervical: No cervical adenopathy.  Skin:    General: Skin is warm and dry.  Neurological:     Mental Status: She is alert and oriented to person, place, and time.  Psychiatric:        Mood and Affect: Mood normal.        Behavior: Behavior normal.        Thought Content: Thought content normal.        Judgment: Judgment normal.     BP 136/84 (BP Location: Left Arm, Patient Position: Sitting, Cuff Size: Large)   Pulse (!) 120   Temp 98.2 F (36.8 C) (Oral)   Resp (!) 22   Ht 5\' 2"  (1.575 m)   Wt 252 lb 6.4 oz (114.5 kg)   LMP 10/04/2023   SpO2 99%   BMI 46.16 kg/m  Wt Readings from Last 3 Encounters:  10/29/23 252 lb 6.4 oz (114.5 kg)  10/20/23 248 lb (112.5 kg)  10/18/23 255 lb (115.7 kg)    Diabetic Foot Exam - Simple   No data filed    Lab Results  Component Value Date   WBC 6.3 10/19/2023   HGB 12.1 10/19/2023   HCT 38.8 10/19/2023   PLT 313 10/19/2023   GLUCOSE 380 (H) 10/19/2023   CHOL 168 06/25/2022   TRIG 125.0 06/25/2022   HDL 38.70 (L) 06/25/2022   LDLCALC 104 (H) 06/25/2022   ALT 33 10/17/2023   AST 17 10/17/2023   NA 134 (L) 10/19/2023   K 3.9 10/19/2023   CL 101 10/19/2023   CREATININE 0.68 10/19/2023   BUN 11 10/19/2023   CO2 22 10/19/2023   TSH 1.33 06/25/2022   INR 1.1 06/19/2022   HGBA1C 12.1 (H) 10/17/2023    Lab Results  Component Value Date   TSH 1.33 06/25/2022   Lab Results  Component Value Date   WBC 6.3 10/19/2023   HGB 12.1 10/19/2023   HCT 38.8 10/19/2023   MCV 81.2 10/19/2023   PLT 313 10/19/2023   Lab Results  Component Value Date   NA 134 (L) 10/19/2023   K 3.9 10/19/2023   CO2 22 10/19/2023   GLUCOSE 380 (H) 10/19/2023    BUN 11 10/19/2023   CREATININE 0.68 10/19/2023   BILITOT 0.6 10/17/2023   ALKPHOS 86 10/17/2023   AST 17 10/17/2023   ALT 33 10/17/2023   PROT 7.8 10/17/2023   ALBUMIN 4.6 10/17/2023   CALCIUM 8.6 (L) 10/19/2023   ANIONGAP 11 10/19/2023   GFR 113.40 06/25/2022   Lab Results  Component Value Date   CHOL 168 06/25/2022   Lab Results  Component Value Date   HDL 38.70 (L) 06/25/2022  Lab Results  Component Value Date   LDLCALC 104 (H) 06/25/2022   Lab Results  Component Value Date   TRIG 125.0 06/25/2022   Lab Results  Component Value Date   CHOLHDL 4 06/25/2022   Lab Results  Component Value Date   HGBA1C 12.1 (H) 10/17/2023       Assessment & Plan:  Vitamin D deficiency Assessment & Plan: Supplement and monitor    Uncontrolled type 2 diabetes mellitus with hyperglycemia (HCC) Assessment & Plan: Tolerating Semglee and Novolog sliding scale and started on Ozempic 0.25 mg weekly  Orders: -     Ambulatory referral to Ophthalmology  Hyperlipidemia, unspecified hyperlipidemia type Assessment & Plan: Encourage heart healthy diet such as MIND or DASH diet, increase exercise, avoid trans fats, simple carbohydrates and processed foods, consider a krill or fish or flaxseed oil cap daily.    Current mild episode of major depressive disorder, unspecified whether recurrent [facility]) Assessment & Plan: Continues to struggle with significant stressors.    Blurry vision Assessment & Plan: Encouraged to minimize carbohydrate intake and referred to ophthalmology for evaluation  Orders: -     Ambulatory referral to Ophthalmology  Irregular menses -     Ambulatory referral to Obstetrics / Gynecology  Encounter for initial prescription of contraceptive pills -     Norgestimate-Eth Estradiol; Take 1 tablet by mouth daily.  Dispense: 84 tablet; Refill: 1  Adult ADHD Assessment & Plan: No change in dosing in meds today   Menometrorrhagia Assessment & Plan: She is  referred back to OB/GYN for evaluation   Other orders -     Ozempic (0.25 or 0.5 MG/DOSE); Inject 0.25 mg into the skin once a week.  Dispense: 3 mL; Refill: 2 -     Insulin Glargine-yfgn; Inject 26 Units into the skin in the morning. May substitute as needed per insurance.  Dispense: 9 mL; Refill: 5 -     Insulin Lispro (1 Unit Dial); Inject 4 Units into the skin 3 (three) times daily with meals. If eating and Blood Glucose (BG) 80 or higher inject 4 units for meal coverage and add correction dose per scale. If not eating, correction dose only. BG <150= 0 unit; BG 150-200= 1 unit; BG 201-250= 3 unit; BG 251-300= 5 unit; BG 301-350= 7 unit; BG 351-400= 9 unit; BG >400= 11 unit and Call Primary Care.  Dispense: 15 mL; Refill: 5 -     Ondansetron; Take 1 tablet (4 mg total) by mouth every 6 (six) hours as needed for nausea or vomiting.  Dispense: 30 tablet; Refill: 3    Assessment and Plan Assessment & Plan      Danise Edge, MD

## 2023-10-29 NOTE — Assessment & Plan Note (Signed)
 No change in dosing in meds today

## 2023-10-29 NOTE — Patient Instructions (Addendum)

## 2023-10-29 NOTE — Assessment & Plan Note (Signed)
 She is referred back to OB/GYN for evaluation

## 2023-10-30 ENCOUNTER — Other Ambulatory Visit (HOSPITAL_COMMUNITY): Payer: Self-pay

## 2023-10-30 ENCOUNTER — Other Ambulatory Visit: Payer: Self-pay | Admitting: Pharmacist

## 2023-10-30 ENCOUNTER — Other Ambulatory Visit: Payer: Self-pay | Admitting: Emergency Medicine

## 2023-10-30 MED ORDER — OZEMPIC (0.25 OR 0.5 MG/DOSE) 2 MG/3ML ~~LOC~~ SOPN: 0.2500 mg | PEN_INJECTOR | SUBCUTANEOUS | 2 refills | Status: DC

## 2023-10-30 MED ORDER — DEXCOM G7 SENSOR MISC: 0 refills | Status: DC

## 2023-10-30 NOTE — Telephone Encounter (Signed)
 Patient called the office. She wanted her Ozympic and Insulin prescription changed to the [facility] on [address]. He said her insurance wouldn't cover the one downstairs

## 2023-10-30 NOTE — Progress Notes (Signed)
 10/30/2023 Name: Melissa [name] MRN: [id number] DOB: 1991/08/04  Chief Complaint  Patient presents with   Diabetes   Medication Management    Melissa [name] is a 33 y.o. year old female who presented for a telephone visit. 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    They were referred to the pharmacist by their PCP for assistance in managing diabetes and medication access.    Subjective: Patient has recently been seen in ED 10/17/2023 and then hospitalized 02/162025 to 10/19/2023 for uncontrolled DM. She is newly diagnosed type 2 DM 10/17/2023 but is currently requiring insulin to control her blood glucose.  She was started on metformin at discharge form ED 2/15 but did not get blood glucose down enough and due to continued symptoms she was admitted to hospital. At discharge she was started on Semglee and Novolog insulin.  Currently Melissa [name] does not have insurance coverage but hopes to be added to her husbands policy soon.  She has used GoodRx card in that past to lower medications costs. She was also provided insulin at discharge from our [facility] pharmacy for $9. She she has one pen of Semglee (about 11 days supply at current dose) and  2 pens of Novolog (about 20 days supply) remaining.  She has completed on [employer] application and was conditionally approved. I completed the provider portion on line today.   Care Team: Primary Care Provider: Bradd Canary, MD ; Next Scheduled Visit: 12/21/2023 Referred to Endocrinology 02/18 but no appointment set yet Will attend diabetes classes 3/4, 3/11 and 3/18 and appointment with dietician 02/16/2024  Medication Access/Adherence  Current Pharmacy:  [id number] [place], [place] - [address] [place] [address] Phone: [phone] Fax: [phone]  [facility] [address] - [place], [place] - [address] [place] [address] Phone: [phone] Fax: [phone]  [place] - [facility] [address] [place] [address] Phone: [phone] Fax: [phone]  [facility] - [facility] [name] [place] [address] Phone: [phone] Fax: [phone]   Patient reports affordability concerns with their medications: Yes  Patient reports access/transportation concerns to their pharmacy: No  Patient reports adherence concerns with their medications:  No     Diabetes:  Current medications:  Metformin XR 500mg  once a day Semglee 26 units once a day (instructed to increase 2 units every 3 days until blood glucose in morning is 120 to 150),  Novolong insulin: She is now taking correction - 4 units as base amount before meals  (and if blood glucose is 80 or higher) with additional correction if blood glucose if blood glucose is elevated based on sliding scale below.   If not eating, correction dose only.  BG <150= 0 unit;  BG 150-200= 1 unit;  BG 201-250= 3 unit;  BG 251-300= 5 unit;  BG 301-350= 7 unit;  BG 351-400= 9 unit;  BG >400= 11 unit and Call Primary Care.   Medications tried in the past: none  This morning blood glucose was 164, was 172 yesterday 2/27 but was > 400 the day before when she ate rice and a biscuit.  She is using Accu-Chek glucometer but was using Libre Continuous Glucose Monitor after discharge. At our last visit 10/27/2023 she had requested Rx for DexCom 7 sensor because she had a coupon  to get 1 for free. However she place the sensor but has not been able to get her phone / app to connect to the sensor.   Patient denies hypoglycemic s/sx including no dizziness, shakiness, sweating. Patient reports hyperglycemic symptoms including nocturia, neuropathy.  She continues to have some nausea but this has improved over the last 3 days.   Current meal patterns:  When she is working can  be variable. Recently she was been eating breakfast and evening meal but not usually eating around noon.    Current medication access support: none   Objective:  Lab Results  Component Value Date   HGBA1C 12.1 (H) 10/17/2023    Lab Results  Component Value Date   CREATININE 0.68 10/19/2023   BUN 11 10/19/2023   NA 134 (L) 10/19/2023   K 3.9 10/19/2023   CL 101 10/19/2023   CO2 22 10/19/2023    Lab Results  Component Value Date   CHOL 168 06/25/2022   HDL 38.70 (L) 06/25/2022   LDLCALC 104 (H) 06/25/2022   TRIG 125.0 06/25/2022   CHOLHDL 4 06/25/2022    Medications Reviewed Today     Reviewed by Melissa [name], RPH-CPP (Pharmacist) on 10/30/23 at 0919  Med List Status: <None>   Medication Order Taking? Sig Documenting Provider Last Dose Status Informant  albuterol (VENTOLIN HFA) 108 (90 Base) MCG/ACT inhaler [id number]  Inhale 2 puffs into the lungs every 6 (six) hours as needed for wheezing or shortness of breath. Melissa Craze, NP  Active Self, Pharmacy Records           Med Note [facility], Melissa [name]   Tue Oct 27, 2023  9:22 AM)    aspirin-acetaminophen-caffeine [facility] MIGRAINE) [phone] MG tablet [id number]  Take 3 tablets by mouth every 6 (six) hours as needed for headache. [provider]  Active Self, Pharmacy Records  Blood Glucose Monitoring Suppl (BLOOD GLUCOSE MONITOR SYSTEM) w/Device KIT [id number]  Use to check blood sugar as directed Melissa Herter, DO  Active   busPIRone (BUSPAR) 30 MG tablet [id number]  Take 1 tablet (30 mg total) by mouth 2 (two) times daily. Melissa Canary, MD  Active Self, Pharmacy Records  clon[facility]) 1 MG tablet [id number]  Take 1 tablet (1 mg total) by mouth 2 (two) times daily as needed. for anxiety Melissa Canary, MD  Active   Continuous Glucose Sensor (DEXCOM G7 SENSOR) MISC [id number] No Use to check blood glucose continuously and assist in administration of short acting insulin / Novolog.  Patient not taking:  Reported on 10/30/2023   Melissa Canary, MD Not Taking Active   gabapentin (NEURONTIN) 300 MG capsule [id number]  TAKE 1 CAPSULE BY MOUTH THREE TIMES DAILY Melissa Canary, MD  Active   Glucose Blood (BLOOD GLUCOSE TEST STRIPS) STRP [id number]  Use to check blood sugar as directed three times daily Melissa Herter, DO  Active   insulin glargine-yfgn (SEMGLEE) 100 UNIT/ML Pen [id number]  Inject 26 Units into the skin in the morning. May substitute as needed per insurance. Melissa Canary, MD  Active   insulin lispro (HUMALOG KWIKPEN) 100 UNIT/ML KwikPen [id number]  Inject 4 Units into the skin 3 (three) times daily with meals. If eating and Blood Glucose (BG) 80 or higher inject 4 units for meal coverage and add correction dose per scale. If not eating, correction dose only. BG <150= 0 unit; BG 150-200= 1 unit; BG 201-250= 3 unit; BG 251-300= 5 unit; BG 301-350= 7 unit; BG 351-400=  9 unit; BG >400= 11 unit and Call Primary Care. Melissa Canary, MD  Active   Insulin Pen Needle (PEN NEEDLES) 31G X 5 MM MISC [id number]  Use to inject insulin as directed Melissa Herter, DO  Active   Lancet Device MISC [id number]  1 each by Does not apply route 3 (three) times daily. May dispense any manufacturer covered by patient's insurance. Melissa Herter, DO  Active   Lancets MISC [id number]  Use to check blood sugar three times daily Melissa Herter, DO  Active   metFORMIN (GLUCOPHAGE-XR) 500 MG 24 hr tablet [id number]  Take 1 tablet (500 mg total) by mouth daily for 7 days, THEN 2 tablets (1,000 mg total) daily. Take with largest meal of the day. *Replaces regular release metformin.Melissa Canary, MD  Active   methylphenidate (RITALIN) 20 MG tablet [id number]  Take 1 tablet (20 mg total) by mouth 3 (three) times daily with meals. Eulis Foster, FNP  Active Self, Pharmacy Records  norgestimate-ethinyl estradiol (ORTHO-CYCLEN) 0.25-35 MG-MCG tablet [id number]  Take 1 tablet by mouth daily. Melissa Canary, MD  Active   ondansetron (ZOFRAN-ODT)  4 MG disintegrating tablet [id number]  Take 1 tablet (4 mg total) by mouth every 6 (six) hours as needed for nausea or vomiting. Melissa Canary, MD  Active   PARoxetine (PAXIL) 10 MG tablet [id number]  TAKE 2 TABLETS BY MOUTH IN THE MORNING AND 1 IN THE Bonna Gains, MD  Active   Semaglutide,0.25 or 0.5MG /DOS, (OZEMPIC, 0.25 OR 0.5 MG/DOSE,) 2 MG/3ML SOPN [id number]  Inject 0.25 mg into the skin once a week. Melissa Canary, MD  Active               Assessment/Plan:   Diabetes: newly diagnoses DM, not currently at goals.  - Tried to trouble shoot over the phone with patient to connect DexCom sensor and her phone. Unsuccessful. She was previously able to use [place] sensor and app. Sample from clinic was place at front desk for [facility] 3 plus for patient to pick up today.  - Discussed carbohydrate goal of 45 gram per meal or less.   - Plan to start Ozempic when able to get thru medication assistance program. Completed provider portion today- plan to fax when in the office Monday.  - Will also request Evaristo Bury (will replace Semglee) and Novolog (will replace Humalog) from [employer] medication assistance program.  - Reviewed directions for Novolog / Humalog - 4 units is her base amount to use if she is eating and blood glucose is > 80.  Add additional correction amount based on sliding scale. .  - Continue metformin ER 500mg  daily with largest meal of the day - after 1 week try to increase to 2 tablets daily.  (Cost is $5 per 30 days at [facility])   Follow Up Plan: 2 to 3 days  Melissa [name], PharmD Clinical Pharmacist [facility] Primary Care  Population Health [phone]

## 2023-11-02 ENCOUNTER — Ambulatory Visit: Payer: BC Managed Care – PPO

## 2023-11-02 ENCOUNTER — Telehealth: Payer: Self-pay | Admitting: Family Medicine

## 2023-11-02 ENCOUNTER — Other Ambulatory Visit: Payer: Self-pay | Admitting: Family Medicine

## 2023-11-02 DIAGNOSIS — Z7984 Long term (current) use of oral hypoglycemic drugs: Secondary | ICD-10-CM

## 2023-11-02 DIAGNOSIS — E1165 Type 2 diabetes mellitus with hyperglycemia: Secondary | ICD-10-CM

## 2023-11-02 DIAGNOSIS — Z794 Long term (current) use of insulin: Secondary | ICD-10-CM

## 2023-11-02 DIAGNOSIS — E119 Type 2 diabetes mellitus without complications: Secondary | ICD-10-CM

## 2023-11-02 MED ORDER — TIRZEPATIDE 2.5 MG/0.5ML ~~LOC~~ SOAJ: 2.5000 mg | SUBCUTANEOUS | 1 refills | Status: DC

## 2023-11-02 NOTE — Telephone Encounter (Signed)
 Copied from CRM [id number]. Topic: Clinical - Prescription Issue >> Nov 02, 2023 10:11 AM Alvino Blood C wrote: Reason for CRM: Patient is needing a prior auth for the following medications:    insulin lispro (HUMALOG KWIKPEN) 100 UNIT/ML KwikPe Semaglutide,0.25 or 0.5MG /DOS, (OZEMPIC, 0.25 OR 0.5 MG/DOSE,) 2 MG/3ML SOPN Continuous Glucose Sensor (DEXCOM G7 SENSOR) MISC Insulin Pen Needle (PEN NEEDLES) 31G X 5 MM MISC  Patient is current;y out of the long acting medication she only has 2 more short active pills

## 2023-11-02 NOTE — Progress Notes (Unsigned)
 11/02/2023 Name: Melissa [name] MRN: [id number] DOB: Sep 21, 1990  No chief complaint on file.   Melissa [name] is a 33 y.o. year old female who presented for a telephone visit. 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    They were referred to the pharmacist by their PCP for assistance in managing diabetes and medication access.    Subjective: Patient has recently been seen in ED 10/17/2023 and then hospitalized 02/162025 to 10/19/2023 for uncontrolled DM. She is newly diagnosed type 2 DM 10/17/2023 but is currently requiring insulin to control her blood glucose.  She was started on metformin at discharge form ED 2/15 but did not get blood glucose down enough and due to continued symptoms she was admitted to hospital. At discharge she was started on Semglee and Novolog insulin.   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 Currently Melissa [name] does not have insurance coverage but hopes to be added to her husbands policy soon.   She has used GoodRx card in that past to lower medications costs. She was also provided insulin at discharge from our [facility] pharmacy for $9. She she has one pen of Semglee (about 11 days supply at current dose) and  2 pens of Novolog (about 20 days supply) remaining.  She has completed on [employer] application and was conditionally approved. I completed the provider portion on line today.   Care Team: Primary Care Provider: Bradd Canary, MD ; Next Scheduled Visit: 12/21/2023 Referred to Endocrinology 02/18 but no appointment set yet Will attend diabetes classes 3/4, 3/11 and 3/18 and appointment with dietician 02/16/2024  Medication Access/Adherence  Current Pharmacy:  [id number] [address] [address], [place] - [address] [facility] [address] [place] [address] Phone: [phone] Fax: [phone]  [facility] [address] - [place], [place] - [address] [place] [address] Phone: [phone] Fax: [phone]  [place] - [facility] [address] [place] [address] Phone: [phone] Fax: [phone]  [facility] - [facility] [address] [place] [address] Phone: [phone] Fax: [phone]   Patient reports affordability concerns with their medications: Yes  Patient reports access/transportation concerns to their pharmacy: No  Patient reports adherence concerns with their medications:  No     Diabetes:  Current medications:  Metformin XR 500mg  once a day Semglee 26 units once a day (instructed to increase 2 units every 3 days until blood glucose in morning is 120 to 150),  Novolong insulin: She is now taking correction - 4 units as base amount before meals  (and if blood glucose is 80 or higher) with additional correction if blood glucose if blood glucose is elevated based on sliding scale below.   If not eating, correction dose only.  BG <150= 0 unit;  BG 150-200= 1 unit;  BG 201-250= 3 unit;  BG 251-300= 5 unit;  BG 301-350= 7 unit;  BG 351-400= 9 unit;  BG >400= 11 unit and Call Primary Care.   Medications tried in the past: none  This morning blood glucose was 164, was 172 yesterday 2/27 but was > 400 the day before when she ate rice and a biscuit.  She is using Accu-Chek glucometer but was using Libre Continuous Glucose Monitor after discharge. At our last  visit 10/27/2023 she had requested Rx for DexCom 7 sensor because she had a coupon to get 1 for free. However she place the sensor but has not been able to get her phone / app to connect to the sensor.   Patient denies hypoglycemic s/sx including no dizziness, shakiness, sweating. Patient reports hyperglycemic symptoms including nocturia, neuropathy.  She continues to have some nausea but this has  improved over the last 3 days.   Current meal patterns:  When she is working can be variable. Recently she was been eating breakfast and evening meal but not usually eating around noon.    Current medication access support: none   Objective:  Lab Results  Component Value Date   HGBA1C 12.1 (H) 10/17/2023    Lab Results  Component Value Date   CREATININE 0.68 10/19/2023   BUN 11 10/19/2023   NA 134 (L) 10/19/2023   K 3.9 10/19/2023   CL 101 10/19/2023   CO2 22 10/19/2023    Lab Results  Component Value Date   CHOL 168 06/25/2022   HDL 38.70 (L) 06/25/2022   LDLCALC 104 (H) 06/25/2022   TRIG 125.0 06/25/2022   CHOLHDL 4 06/25/2022    Medications Reviewed Today   Medications were not reviewed in this encounter       Assessment/Plan:   Diabetes: newly diagnoses DM, not currently at goals.  - Tried to trouble shoot over the phone with patient to connect DexCom sensor and her phone. Unsuccessful. She was previously able to use [place] sensor and app. Sample from clinic was place at front desk for [facility] 3 plus for patient to pick up today.  - Discussed carbohydrate goal of 45 gram per meal or less.   - Plan to start Ozempic when able to get thru medication assistance program. Completed provider portion today- plan to fax when in the office Monday.  - Will also request Evaristo Bury (will replace Semglee) and Novolog (will replace Humalog) from [employer] medication assistance program.  - Reviewed directions for Novolog / Humalog - 4 units is her base amount to use if she is eating and blood glucose is > 80.  Add additional correction amount based on sliding scale. .  - Continue metformin ER 500mg  daily with largest meal of the day - after 1 week try to increase to 2 tablets daily.  (Cost is $5 per 30 days at [facility])   Follow Up Plan: 2 to 3 days  Henrene Pastor, PharmD Clinical Pharmacist [facility] Primary Care  Population Health [phone]

## 2023-11-03 ENCOUNTER — Other Ambulatory Visit (HOSPITAL_COMMUNITY): Payer: Self-pay

## 2023-11-03 ENCOUNTER — Ambulatory Visit: Payer: No Typology Code available for payment source

## 2023-11-03 MED ORDER — INSULIN LISPRO (1 UNIT DIAL) 100 UNIT/ML (KWIKPEN): PEN_INJECTOR | SUBCUTANEOUS | 1 refills | Status: DC

## 2023-11-03 MED ORDER — TIRZEPATIDE 2.5 MG/0.5ML ~~LOC~~ SOAJ: 2.5000 mg | SUBCUTANEOUS | 0 refills | Status: DC

## 2023-11-03 MED ORDER — DEXCOM G7 SENSOR MISC: 1 refills | Status: DC

## 2023-11-03 MED ORDER — INSULIN GLARGINE-YFGN 100 UNIT/ML ~~LOC~~ SOPN: PEN_INJECTOR | SUBCUTANEOUS | 1 refills | Status: DC

## 2023-11-04 ENCOUNTER — Other Ambulatory Visit: Payer: Self-pay

## 2023-11-04 ENCOUNTER — Other Ambulatory Visit (HOSPITAL_COMMUNITY): Payer: Self-pay

## 2023-11-04 DIAGNOSIS — E119 Type 2 diabetes mellitus without complications: Secondary | ICD-10-CM | POA: Insufficient documentation

## 2023-11-04 DIAGNOSIS — H53143 Visual discomfort, bilateral: Secondary | ICD-10-CM | POA: Diagnosis not present

## 2023-11-04 DIAGNOSIS — H538 Other visual disturbances: Secondary | ICD-10-CM | POA: Diagnosis not present

## 2023-11-04 DIAGNOSIS — H52223 Regular astigmatism, bilateral: Secondary | ICD-10-CM | POA: Diagnosis not present

## 2023-11-04 LAB — HM DIABETES EYE EXAM

## 2023-11-05 ENCOUNTER — Telehealth: Payer: Self-pay

## 2023-11-05 ENCOUNTER — Other Ambulatory Visit: Payer: Self-pay | Admitting: *Deleted

## 2023-11-05 ENCOUNTER — Other Ambulatory Visit (HOSPITAL_COMMUNITY): Payer: Self-pay

## 2023-11-05 NOTE — Telephone Encounter (Signed)
 Ran test claims for all medications. Medications were either refill too soon or covered by insurance. Please see telephone encounters on 11/05/23.  PA request for Ozempic but Greggory Keen is on medication list. Greggory Keen was also recently filled through insurance.

## 2023-11-05 NOTE — Telephone Encounter (Signed)
 Pharmacy Patient Advocate Encounter   Received notification from Pt Calls Messages that prior authorization for B-D MINI PEN NEEDLES is required/requested.   Insurance verification completed.   The patient is insured through [employer] .   Per test claim: The current 30 day co-pay is, $0.  No PA needed at this time. This test claim was processed through [facility]- copay amounts may vary at other pharmacies due to pharmacy/plan contracts, or as the patient moves through the different stages of their insurance plan.

## 2023-11-05 NOTE — Telephone Encounter (Signed)
 Pharmacy Patient Advocate Encounter   Received notification from Pt Calls Messages that prior authorization for HUMALOG [facility] is required/requested.   Insurance verification completed.   The patient is insured through [employer] .   Per test claim: Refill too soon. PA is not needed at this time. Medication was filled 11/03/23. Next eligible fill date is 11/24/23.

## 2023-11-05 NOTE — Telephone Encounter (Signed)
 Pharmacy Patient Advocate Encounter   Received notification from Pt Calls Messages that prior authorization for [facility] G7 SENSOR is required/requested.   Insurance verification completed.   The patient is insured through [employer] .   Per test claim: The current 30 day co-pay is, $50.  No PA needed at this time. This test claim was processed through [facility]- copay amounts may vary at other pharmacies due to pharmacy/plan contracts, or as the patient moves through the different stages of their insurance plan.

## 2023-11-05 NOTE — Telephone Encounter (Signed)
 Pharmacy Patient Advocate Encounter   Received notification from Pt Calls Messages that prior authorization for [facility] 2.5MG /0.5ML is required/requested.   Insurance verification completed.   The patient is insured through [employer] .   Per test claim: Refill too soon. PA is not needed at this time. Medication was filled 11/04/23. Next eligible fill date is 11/24/23.

## 2023-11-09 ENCOUNTER — Ambulatory Visit (INDEPENDENT_AMBULATORY_CARE_PROVIDER_SITE_OTHER): Admitting: Pharmacist

## 2023-11-09 DIAGNOSIS — Z794 Long term (current) use of insulin: Secondary | ICD-10-CM

## 2023-11-09 DIAGNOSIS — E1165 Type 2 diabetes mellitus with hyperglycemia: Secondary | ICD-10-CM

## 2023-11-09 MED ORDER — BLOOD GLUCOSE TEST VI STRP: ORAL_STRIP | 2 refills | Status: DC

## 2023-11-09 MED ORDER — DEXCOM G7 SENSOR MISC
1 refills | Status: DC
Start: 2023-11-09 — End: 2024-05-23

## 2023-11-09 NOTE — Progress Notes (Signed)
 11/02/2023 Name: Melissa [name] MRN: [id number] DOB: [birth year]  No chief complaint on file.   Melissa [name] is a 33 y.o. year old female who presented for a telephone visit. 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    They were referred to the pharmacist by their PCP for assistance in managing diabetes and medication access.    Subjective: Patient was seen in ED 10/17/2023 and then hospitalized 02/162025 to 10/19/2023 for uncontrolled DM. She is newly diagnosed type 2 DM 10/17/2023 but is currently requiring insulin to control her blood glucose.   Melissa [name] did not have insurance initially but she has been added to her husband's work Tour manager with [employer] for medical and [employer] for pharmacy benefits.   Care Team: Primary Care Provider: Bradd Canary, MD ; Next Scheduled Visit: 12/21/2023 Referred to Endocrinology 02/18 but no appointment set yet She was scheduled with dietician for diabetes classes 3/4, 3/11 and 3/18 and appointment with dietician 02/16/2024  Medication Access/Adherence  Current Pharmacy:  [id number], [place] - [address] [place] [address] Phone: [phone] Fax: [phone]  [facility] [place], [place] - [address] [place] [address] Phone: [phone] Fax: [phone]  [facility] [address] [place] [address] Phone: [phone] Fax: [phone]  [facility] [address] [place] [address] Phone: [phone] Fax: [phone]   Patient reports affordability concerns with their medications: Yes  Patient reports access/transportation concerns to their pharmacy: No  Patient reports adherence concerns with their medications:  No     Diabetes:  BMI = 46.16  Current medications:   Metformin XR 500mg  twice a day Mounjaro 2.5mg  weekly  Semglee 26 units once a day (instructed to increase 2 units every 3 days until blood glucose in morning is 120 to 150),  Novolog / Humalog insulin: She has not taken this with every meal recenlty due to low blood glucose readings. Directions are inject 4 units as base amount before meals  (and if blood glucose is 80 or higher) with additional correction if blood glucose if blood glucose is elevated based on sliding scale below.   If not eating, correction dose only.  BG <150= 0 unit;  BG 150-200= 1 unit;  BG 201-250= 3 unit;  BG 251-300= 5 unit;  BG 301-350= 7 unit;  BG 351-400= 9 unit;  BG >400= 11 unit and Call Primary Care.   Medications tried in the past: none  She is using Accu-Chek glucometer if she does not have Continuous Glucose Monitor but has had to purchase a box of 25 test strips on her own.  She has been using Dex Com 7 system for the last 4 days. She prefers DexCom over [place] 3 system.   CGM Documentation:  Days Worn: 4 (recommend 14 days) % Time CGM is active: 74% (goal >=70%) Average Glucose: 113 mg/dL Glucose Management Indicator: not enough readings to determine  Glucose Variability: 31.5% (goal <36%) Time in Range:  - Time above range >250: 1% (typical goal: <5%) - Time above range 181-250: 5% (typical goal <20%) - Time in range 70-180 90% (typical goal >=70%) - Time below range 54-69: 3% (typical goal <4%) - Time below range: 1% (typical goal <1%)        Patient reports increase in  hypoglycemic s/sx including dizziness, shakiness, sweating at times but DexCom usually alerts her before she gets too low.  Patient reports hyperglycemic symptoms have improved. States he vision has completely improved. Still having headaches occasionally. Denies. Polyuria, nocturia, neuropathy.   Current meal patterns:  When she is working can be variable. Recently she was been eating breakfast and evening meal but not  usually eating around noon.   Current medication access support: none   Objective:  Lab Results  Component Value Date   HGBA1C 12.1 (H) 10/17/2023    Lab Results  Component Value Date   CREATININE 0.68 10/19/2023   BUN 11 10/19/2023   NA 134 (L) 10/19/2023   K 3.9 10/19/2023   CL 101 10/19/2023   CO2 22 10/19/2023    Lab Results  Component Value Date   CHOL 168 06/25/2022   HDL 38.70 (L) 06/25/2022   LDLCALC 104 (H) 06/25/2022   TRIG 125.0 06/25/2022   CHOLHDL 4 06/25/2022    Medications Reviewed Today     Reviewed by Henrene Pastor, RPH-CPP (Pharmacist) on 11/09/23 at 1355  Med List Status: <None>   Medication Order Taking? Sig Documenting Provider Last Dose Status Informant  albuterol (VENTOLIN HFA) 108 (90 Base) MCG/ACT inhaler [id number] Yes Inhale 2 puffs into the lungs every 6 (six) hours as needed for wheezing or shortness of breath. Sandford Craze, NP Taking Active Self, Pharmacy Records           Med Note [facility] B   Tue Oct 27, 2023  9:22 AM)    aspirin-acetaminophen-caffeine Genella Mech MIGRAINE) [phone] MG tablet [id number] Yes Take 3 tablets by mouth every 6 (six) hours as needed for headache. [provider] Taking Active Self, Pharmacy Records  Blood Glucose Monitoring Suppl (BLOOD GLUCOSE MONITOR SYSTEM) w/Device KIT [id number] Yes Use to check blood sugar as directed Carollee Herter, DO Taking Active   busPIRone (BUSPAR) 30 MG tablet [id number] Yes Take 1 tablet (30 mg total) by mouth 2 (two) times daily. Bradd Canary, MD Taking Active Self, Pharmacy Records  clonazePAM [facility]) 1 MG tablet [id number] Yes Take 1 tablet (1 mg total) by mouth 2 (two) times daily as needed. for anxiety Bradd Canary, MD Taking Active   Continuous Glucose Sensor (DEXCOM G7 SENSOR) MISC [id number] Yes Use to check blood glucose continuously and assist in administration of short acting insulin / Novolog. Bradd Canary, MD Taking Active   gabapentin (NEURONTIN)  300 MG capsule [id number]  TAKE 1 CAPSULE BY MOUTH THREE TIMES DAILY Bradd Canary, MD  Active   Glucose Blood (BLOOD GLUCOSE TEST STRIPS) STRP [id number] Yes Use to check blood sugar as directed three times daily Carollee Herter, DO Taking Active   insulin glargine-yfgn (SEMGLEE) 100 UNIT/ML Pen [id number] Yes Inject 26 to 50 units daily (increasing by 2 units every 2 days until fasting blood glucose is < 130). Bradd Canary, MD Taking Active   insulin lispro (HUMALOG KWIKPEN) 100 UNIT/ML KwikPen [id number] Yes If eating and Blood Glucose (BG) 80 or higher inject 4 units for meal coverage 3 times a day and add correction dose per scale. If not eating, correction dose only. BG <150= 0 unit; BG 150-200= 1 unit; BG 201-250= 3 unit; BG 251-300= 5 unit; BG 301-350= 7 unit; BG 351-400= 9 unit; BG >400= 11 unit and Call Primary Care. Bradd Canary, MD Taking Active   Insulin Pen Needle (PEN NEEDLES) 31G X 5 MM MISC [id number] Yes Use to inject insulin as  directed Carollee Herter, DO Taking Active   Lancets MISC [id number] Yes Use to check blood sugar three times daily Carollee Herter, DO Taking Active   metFORMIN (GLUCOPHAGE-XR) 500 MG 24 hr tablet [id number] Yes Take 1 tablet (500 mg total) by mouth daily for 7 days, THEN 2 tablets (1,000 mg total) daily. Take with largest meal of the day. *Replaces regular release metformin.Bradd Canary, MD Taking Active   methylphenidate (RITALIN) 20 MG tablet [id number] Yes Take 1 tablet (20 mg total) by mouth 3 (three) times daily with meals. Eulis Foster, FNP Taking Active Self, Pharmacy Records  norgestimate-ethinyl estradiol (ORTHO-CYCLEN) 0.25-35 MG-MCG tablet [id number]  Take 1 tablet by mouth daily. Bradd Canary, MD  Active   ondansetron (ZOFRAN-ODT) 4 MG disintegrating tablet [id number]  Take 1 tablet (4 mg total) by mouth every 6 (six) hours as needed for nausea or vomiting. Bradd Canary, MD  Active   PARoxetine (PAXIL) 10 MG tablet [id number] Yes TAKE 2 TABLETS BY MOUTH  IN THE MORNING AND 1 IN THE Bonna Gains, MD Taking Active   tirzepatide [facility]) 2.5 MG/0.5ML Pen [id number] Yes Inject 2.5 mg into the skin once a week. Bradd Canary, MD Taking Active               Assessment/Plan:   Diabetes: newly diagnoses DM, not currently at goals. .  - Continue Mounjaro 2.5 mg weekly - per [facility] -  $25 / 30 day sand $75 per 90 days. After 4 doses if toleraated will increase to 5mg  weekly.  - Hold Humalog with meals for a few days to see if still needed since [facility] was started.  - Decreased Semglee from 26 units daily to 20 units daily to see if hypoglycemia improves.  - Patient would prefer to use DexCom 7 sensor/. [facility]. IN past [facility] representative reported cost would be $31.11 / 30 days and $72.50 / 90 days (cost of Libre 3+ sensor $25 / $50).  - Continue metformin ER 500mg  2 tablets daily.  (Cost is $5 per 30 days at [facility])  Meds ordered this encounter  Medications   Continuous Glucose Sensor (DEXCOM G7 SENSOR) MISC    Sig: Use to check blood glucose continuously and assist in administration of short acting insulin / Novolog.    Dispense:  9 each    Refill:  1    No insurance current - pt to bring in free trial coupon for 1 sensor.   Glucose Blood (BLOOD GLUCOSE TEST STRIPS) STRP    Sig: Use to check blood glucose when indicated by Continuous Glucose Monitor to making dosing decisions. May use up to twice a day    Dispense:  50 strip    Refill:  2    Accu-Chek Guide     Follow Up Plan: 5 to 7 days to review Continuous Glucose Monitor.  Henrene Pastor, PharmD Clinical Pharmacist [facility] Primary Care  Population Health [phone]

## 2023-11-10 ENCOUNTER — Ambulatory Visit: Payer: No Typology Code available for payment source

## 2023-11-11 ENCOUNTER — Other Ambulatory Visit (HOSPITAL_COMMUNITY): Payer: Self-pay

## 2023-11-11 ENCOUNTER — Other Ambulatory Visit: Payer: Self-pay | Admitting: *Deleted

## 2023-11-11 MED ORDER — INSULIN LISPRO (1 UNIT DIAL) 100 UNIT/ML (KWIKPEN): PEN_INJECTOR | SUBCUTANEOUS | 1 refills | Status: DC

## 2023-11-15 ENCOUNTER — Telehealth: Payer: Self-pay | Admitting: Pharmacist

## 2023-11-15 NOTE — Telephone Encounter (Signed)
 Received call from patient asking about which pharmacy her DexCom sensors were sent to. She had tire dto get at [facility] but they state thye had been filled somewhere else.  [facility] sent in Rx for # 9 sensors or 90 DS due per records only 1 sensor was filled. [facility] representative states only one sensor was filled because patient was using a one time free coupon and it only allows 1 sensor.  Patient aware. She also asked for 30 days of Accu Check test strips - this [facility] 11/09/2023

## 2023-11-16 ENCOUNTER — Ambulatory Visit (INDEPENDENT_AMBULATORY_CARE_PROVIDER_SITE_OTHER): Admitting: Pharmacist

## 2023-11-16 DIAGNOSIS — Z794 Long term (current) use of insulin: Secondary | ICD-10-CM

## 2023-11-16 DIAGNOSIS — E1165 Type 2 diabetes mellitus with hyperglycemia: Secondary | ICD-10-CM

## 2023-11-16 MED ORDER — INSULIN GLARGINE-YFGN 100 UNIT/ML ~~LOC~~ SOPN: PEN_INJECTOR | SUBCUTANEOUS | Status: DC

## 2023-11-16 MED ORDER — TIRZEPATIDE 5 MG/0.5ML ~~LOC~~ SOAJ: 5.0000 mg | SUBCUTANEOUS | 1 refills | Status: DC

## 2023-11-16 NOTE — Progress Notes (Signed)
 11/16/23 Name: Melissa [name] MRN: [id number] DOB: 05/29/1991  Chief Complaint  Patient presents with   Diabetes    Melissa [name] is a 33 y.o. year old female who presented for a telephone visit. 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    They were referred to the pharmacist by their PCP for assistance in managing diabetes and medication access.    Subjective: Patient was seen in ED 10/17/2023 and then hospitalized 02/162025 to 10/19/2023 for uncontrolled DM. She is newly diagnosed type 2 DM 10/17/2023 but is currently requiring insulin to control her blood glucose.   Melissa [name] did not have insurance initially but she has been added to her husband's work Tour manager with [employer] for medical and [employer] for pharmacy benefits.   Care Team: Primary Care Provider: Bradd Canary, MD ; Next Scheduled Visit: 12/21/2023 Referred to Endocrinology 02/18 but no appointment set yet She was scheduled with dietician for diabetes classes 3/4, 3/11 and 3/18 and appointment with dietician 02/16/2024  Medication Access/Adherence  Current Pharmacy:  [id number] [address], [place] - [address] [place] [address] Phone: [phone] Fax: [phone]  [facility] - [place], [place] - [address] [place] [address] Phone: [phone] Fax: [phone]  [facility] [address] [place] [address] Phone: [phone] Fax: [phone]  [facility] - [facility] [address] [place] [address] Phone: [phone] Fax: [phone]   Patient reports affordability concerns with their medications: Yes  Patient reports access/transportation concerns to their pharmacy: No  Patient reports adherence concerns with their medications:  No     Diabetes:  BMI = 46.16  Current  medications:  Metformin XR 500mg  twice a day Mounjaro 2.5mg  weekly  Semglee 24 units once a day (she was supposed to decrease dose to 20 units daily 2 weeks ago due to low blood glucose readings after starting Mounjaro but patient was worried her blood glucose wouls be too high and only decreased to 24 units daily) Novolog / Humalog insulin: Currently holding. Directions are inject 4 units as base amount before meals  (and if blood glucose is 80 or higher) with additional correction if blood glucose if blood glucose is elevated based on sliding scale below.   If not eating, correction dose only.  BG <150= 0 unit;  BG 150-200= 1 unit;  BG 201-250= 3 unit;  BG 251-300= 5 unit;  BG 301-350= 7 unit;  BG 351-400= 9 unit;  BG >400= 11 unit and Call Primary Care.   Medications tried in the past: none  She is using Accu-Chek glucometer if she does not have Continuous Glucose Monitor. She reports that Accu-Chek sent her a free meter.  She has been using Dex Com 7 system for the last 14 days. She prefers DexCom over [place] 3 system.   CGM Documentation:  Days Worn: 14 (recommend 14 days) % Time CGM is active: 87% (goal >=70%) Average Glucose: 105 mg/dL Glucose Management Indicator: 5.8% Glucose Variability: 27.4% (goal <36%) Time in Range:  - Time above range >250: 0% (typical goal: <5%) - Time above range 181-250: 3% (typical goal <20%) - Time in range 70-180 91% (typical goal >=70%) - Time below range 54-69: 3% (typical goal <4%) - Time below range: 3% (typical goal <1%)  Patient reports some hypoglycemic s/sx including dizziness, shakiness, sweating at times but DexCom usually alerts her before she gets too low. She states lows between Sunday 3/16 and Monday 3/17 were not as low a DexCom warned - she check with fingerstick and Continuous Glucose Monitor was 89 (Continuous Glucose Monitor showed 59)  Patient reports hyperglycemic symptoms have improved. States he vision has  completely improved. Still having headaches occasionally. Denies. Polyuria, nocturia, neuropathy.   Current meal patterns:  When she is working can be variable. Recently she was been eating breakfast and evening meal.  She will start a new job tomorrow so that she will not work night shift and she can eat more regularly.   Patient is tearful on the phone today. She states she feels overwhelmed with trying to manage mer blood glucose, work and take care of her family. She met with counselor today to gave her coping tips.   Current medication access support: none   Objective:  Lab Results  Component Value Date   HGBA1C 12.1 (H) 10/17/2023    Lab Results  Component Value Date   CREATININE 0.68 10/19/2023   BUN 11 10/19/2023   NA 134 (L) 10/19/2023   K 3.9 10/19/2023   CL 101 10/19/2023   CO2 22 10/19/2023    Lab Results  Component Value Date   CHOL 168 06/25/2022   HDL 38.70 (L) 06/25/2022   LDLCALC 104 (H) 06/25/2022   TRIG 125.0 06/25/2022   CHOLHDL 4 06/25/2022    Medications Reviewed Today     Reviewed by Henrene Pastor, RPH-CPP (Pharmacist) on 11/16/23 at 1418  Med List Status: <None>   Medication Order Taking? Sig Documenting Provider Last Dose Status Informant  albuterol (VENTOLIN HFA) 108 (90 Base) MCG/ACT inhaler [id number]  Inhale 2 puffs into the lungs every 6 (six) hours as needed for wheezing or shortness of breath. Sandford Craze, NP  Active Self, Pharmacy Records           Med Note [facility], [facility] B   Tue Oct 27, 2023  9:22 AM)    aspirin-acetaminophen-caffeine Genella Mech MIGRAINE) [phone] MG tablet [id number] Yes Take 3 tablets by mouth every 6 (six) hours as needed for headache. [provider] Taking Active Self, Pharmacy Records  Blood Glucose Monitoring Suppl (BLOOD GLUCOSE MONITOR SYSTEM) w/Device KIT [id number] Yes Use to check blood sugar as directed Carollee Herter, DO Taking Active   busPIRone (BUSPAR) 30 MG tablet [id number] Yes Take 1 tablet  (30 mg total) by mouth 2 (two) times daily. Bradd Canary, MD Taking Active Self, Pharmacy Records  clonazePAM [facility]) 1 MG tablet [id number] Yes Take 1 tablet (1 mg total) by mouth 2 (two) times daily as needed. for anxiety Bradd Canary, MD Taking Active   Continuous Glucose Sensor (DEXCOM G7 SENSOR) MISC [id number] Yes Use to check blood glucose continuously and assist in administration of short acting insulin / Novolog. Bradd Canary, MD Taking Active   gabapentin (NEURONTIN) 300 MG capsule [id number] Yes TAKE 1 CAPSULE BY MOUTH THREE TIMES DAILY Bradd Canary, MD Taking Active   Glucose Blood (BLOOD GLUCOSE TEST STRIPS) STRP [id number] Yes Use to check blood glucose when indicated by Continuous Glucose Monitor to making dosing decisions. May use up to twice a day Bradd Canary, MD Taking Active   insulin glargine-yfgn [facility]) 100 UNIT/ML Pen [id number] Yes Inject 26 to 50 units daily (increasing by 2 units every 2 days until fasting blood glucose is < 130). Bradd Canary,  MD Taking Active   insulin lispro (HUMALOG KWIKPEN) 100 UNIT/ML KwikPen [id number] Yes Use as directed per scale below.  Max daily dose 33 units.  If eating and Blood Glucose (BG) 80 or higher inject 4 units for meal coverage 3 times a day and add correction dose per scale. If not eating, correction dose only. BG <150= 0 unit; BG 150-200= 1 unit; BG 201-250= 3 unit; BG 251-300= 5 unit; BG 301-350= 7 unit; BG 351-400= 9 unit; BG >400= 11 unit and Call Primary Care. Bradd Canary, MD Taking Active   Insulin Pen Needle (PEN NEEDLES) 31G X 5 MM MISC [id number] Yes Use to inject insulin as directed Carollee Herter, DO Taking Active   Lancets MISC [id number] Yes Use to check blood sugar three times daily Carollee Herter, DO Taking Active   metFORMIN (GLUCOPHAGE-XR) 500 MG 24 hr tablet [id number] Yes Take 1 tablet (500 mg total) by mouth daily for 7 days, THEN 2 tablets (1,000 mg total) daily. Take with largest meal of the day. *Replaces  regular release metformin.Bradd Canary, MD Taking Active   methylphenidate (RITALIN) 20 MG tablet [id number] Yes Take 1 tablet (20 mg total) by mouth 3 (three) times daily with meals. Eulis Foster, FNP Taking Active Self, Pharmacy Records  norgestimate-ethinyl estradiol (ORTHO-CYCLEN) 0.25-35 MG-MCG tablet [id number] Yes Take 1 tablet by mouth daily. Bradd Canary, MD Taking Active   ondansetron (ZOFRAN-ODT) 4 MG disintegrating tablet [id number] Yes Take 1 tablet (4 mg total) by mouth every 6 (six) hours as needed for nausea or vomiting. Bradd Canary, MD Taking Active   PARoxetine (PAXIL) 10 MG tablet [id number] Yes TAKE 2 TABLETS BY MOUTH IN THE MORNING AND 1 IN THE Bonna Gains, MD Taking Active   tirzepatide [facility]) 2.5 MG/0.5ML Pen [id number] Yes Inject 2.5 mg into the skin once a week. Bradd Canary, MD Taking Active               Assessment/Plan:  Diabetes: newly diagnoses DM per recent Continuous Glucose Monitor data - blood glucose is improving. - Increase Mounjaro 5mg  mg weekly - per [facility] -  $25 / 30 day sand $75 per 90 days. After 4 doses if tolerated will increase to 7.5mg  weekly.  - Continue to hold Humalog with meals since Mounjaro was started.  - Decreased Semglee from 24 to 22 units daily (will continue to lower dose as able - patient instructed to contact office if blood glucose is < 70) - Continue to use DexCom 7 sensors - changing every 10 days. Left patient 1 sensor to use until she starts her new job and can afford to get full Rx for DexCom sensor.  - Continue metformin ER 500mg  2 tablets daily.  (Cost is $5 per 30 days at [facility])  Follow Up Plan: 7 to 10  days to review Continuous Glucose Monitor.  Henrene Pastor, PharmD Clinical Pharmacist [facility] Primary Care  Population Health [phone]

## 2023-11-17 ENCOUNTER — Telehealth: Payer: Self-pay | Admitting: Emergency Medicine

## 2023-11-17 ENCOUNTER — Encounter: Payer: Self-pay | Admitting: Emergency Medicine

## 2023-11-17 ENCOUNTER — Ambulatory Visit: Payer: No Typology Code available for payment source

## 2023-11-17 ENCOUNTER — Other Ambulatory Visit: Payer: Self-pay | Admitting: Family Medicine

## 2023-11-17 ENCOUNTER — Telehealth: Payer: Self-pay | Admitting: Family Medicine

## 2023-11-17 MED ORDER — METHYLPHENIDATE HCL 20 MG PO TABS: 20.0000 mg | ORAL_TABLET | Freq: Three times a day (TID) | ORAL | 0 refills | Status: DC

## 2023-11-17 NOTE — Telephone Encounter (Signed)
 Copied from CRM [id number]. Topic: Clinical - Prescription Issue >> Nov 17, 2023  3:39 PM Gurney Maxin H wrote: Reason for CRM: Patient is requesting to have prescription for her methylphenidate (RITALIN) 20 MG tablet sent to the [facility] on [address], [facility] and [facility] some in [facility].  Swati [phone]

## 2023-11-17 NOTE — Telephone Encounter (Signed)
 Called and spoke with patient about status of medication and referrals. She is interested in having bariatric surgery and would like to know the next steps

## 2023-11-17 NOTE — Telephone Encounter (Signed)
 The medication request came through again. Could you please sign the order? Thank you!

## 2023-11-17 NOTE — Telephone Encounter (Signed)
 Copied from CRM [id number]. Topic: Referral - Question >> Nov 17, 2023  3:43 PM Gurney Maxin H wrote: Reason for CRM: Patient was calling stating she hasn't received a call to set up appointments from referral for Obgyne and Podiatry. Podiatry referral shows closed due to no contact with patient, agent updated patients phone number previous number on file for patient was wrong. Please reach out to patient to schedule, thanks.  Michaelah [phone]

## 2023-11-17 NOTE — Telephone Encounter (Signed)
 [facility]. Pharmacist said they got a supply in today and can have prescription ready in the morning. I called the patient back and made her aware. Will send a message in MyChart also

## 2023-11-18 ENCOUNTER — Encounter: Payer: Self-pay | Admitting: Emergency Medicine

## 2023-11-18 ENCOUNTER — Other Ambulatory Visit: Payer: Self-pay | Admitting: Family Medicine

## 2023-11-18 MED ORDER — METHYLPHENIDATE HCL 20 MG PO TABS: 20.0000 mg | ORAL_TABLET | Freq: Three times a day (TID) | ORAL | 0 refills | Status: DC

## 2023-11-20 ENCOUNTER — Encounter: Payer: Self-pay | Admitting: *Deleted

## 2023-11-21 ENCOUNTER — Other Ambulatory Visit: Payer: Self-pay | Admitting: Family Medicine

## 2023-11-23 ENCOUNTER — Other Ambulatory Visit (HOSPITAL_COMMUNITY): Payer: Self-pay

## 2023-11-23 ENCOUNTER — Other Ambulatory Visit (HOSPITAL_BASED_OUTPATIENT_CLINIC_OR_DEPARTMENT_OTHER): Payer: Self-pay

## 2023-11-23 ENCOUNTER — Other Ambulatory Visit: Payer: Self-pay | Admitting: Family Medicine

## 2023-11-24 ENCOUNTER — Other Ambulatory Visit (HOSPITAL_COMMUNITY): Payer: Self-pay

## 2023-11-25 ENCOUNTER — Other Ambulatory Visit: Payer: Self-pay | Admitting: Family

## 2023-11-26 ENCOUNTER — Other Ambulatory Visit: Payer: Self-pay | Admitting: Pharmacist

## 2023-11-30 ENCOUNTER — Encounter: Payer: Self-pay | Admitting: Pharmacist

## 2023-11-30 ENCOUNTER — Encounter: Payer: Self-pay | Admitting: Family Medicine

## 2023-12-01 ENCOUNTER — Encounter: Payer: Self-pay | Admitting: Family Medicine

## 2023-12-01 ENCOUNTER — Other Ambulatory Visit: Payer: Self-pay | Admitting: Pharmacist

## 2023-12-01 ENCOUNTER — Other Ambulatory Visit (HOSPITAL_COMMUNITY): Payer: Self-pay

## 2023-12-01 ENCOUNTER — Telehealth (INDEPENDENT_AMBULATORY_CARE_PROVIDER_SITE_OTHER): Payer: Self-pay | Admitting: Family Medicine

## 2023-12-01 DIAGNOSIS — F419 Anxiety disorder, unspecified: Secondary | ICD-10-CM

## 2023-12-01 DIAGNOSIS — F39 Unspecified mood [affective] disorder: Secondary | ICD-10-CM

## 2023-12-01 DIAGNOSIS — F909 Attention-deficit hyperactivity disorder, unspecified type: Secondary | ICD-10-CM

## 2023-12-01 DIAGNOSIS — F32A Depression, unspecified: Secondary | ICD-10-CM

## 2023-12-01 MED ORDER — ARIPIPRAZOLE 2 MG PO TABS: 2.0000 mg | ORAL_TABLET | Freq: Every day | ORAL | 2 refills | Status: DC

## 2023-12-01 NOTE — Progress Notes (Signed)
 MyChart Video Visit    Virtual Visit via Video Note   This patient is at least at moderate risk for complications without adequate follow up. This format is felt to be most appropriate for this patient at this time. Physical exam was limited by quality of the video and audio technology used for the visit. [name], CMA was able to get the patient set up on a video visit.  Patient location: Home Patient and provider in visit Provider location: Office  I discussed the limitations of evaluation and management by telemedicine and the availability of in person appointments. The patient expressed understanding and agreed to proceed.  Visit Date: 12/01/2023  Today's healthcare provider: Danise Edge, MD     Subjective:    Patient ID: Melissa [name], female    DOB: 04-13-1991, 33 y.o.   MRN: [id number]  Chief Complaint  Patient presents with   Medication Problem    HPI Discussed the use of AI scribe software for clinical note transcription with the patient, who gave verbal consent to proceed.  History of Present Illness Melissa [name] is a 33 year old female who presents for medication management and psychiatric referral.  She experiences mood instability, which she attributes to her use of Ritalin. Her moods are described as not being 'that bad' but are influenced by Ritalin. She has a history of psychiatric care but lost contact due to staffing changes at the facility. She notes being irritable especially in the evening.  She is currently taking Paxil and is considering starting Abilify at a low dose of 2 mg at bedtime. She inquires about the compatibility of these medications. She prefers the least risky option for managing her mood swings.  She is on [facility], currently at 5 mg, for weight management. She is monitoring its effects on her appetite and notes no significant changes yet. No side effects such as GI upset noted.     Past Medical History:  Diagnosis Date   Abnormal  Pap smear of cervix 07/11/2011   Acid reflux    Anxiety    Asthma    activity induced   Diabetes mellitus without complication (HCC)    Hip abductor tendinitis 01/23/2015   Irregular menses    Polycystic ovarian syndrome     Past Surgical History:  Procedure Laterality Date   NO PAST SURGERIES      Family History  Problem Relation Age of Onset   Gallbladder disease Mother    Colon polyps Mother    Hypertension Father    Other Father        Hepatitis A   Diabetes Father    Drug abuse Father    Post-traumatic stress disorder Father    Stomach cancer Maternal Grandmother    Diabetes Maternal Grandmother    Colon polyps Maternal Grandmother    Colon cancer Maternal Grandmother    Cancer Maternal Grandmother        stomach cancer   Alcohol abuse Maternal Grandmother    Emphysema Maternal Grandfather    Cancer Maternal Grandfather        smoke, lung cancer   Alcohol abuse Maternal Grandfather    Heart disease Paternal Grandfather    Alcohol abuse Paternal Grandfather    Breast cancer Other        mat great aunts x 2   Colon cancer Other        mat great uncles x 2   Irritable bowel syndrome Sister    Multiple sclerosis Sister  Bipolar disorder Sister    Colon polyps Paternal Uncle    Alcohol abuse Paternal Grandmother    Pancreatic cancer Neg Hx    Esophageal cancer Neg Hx     Social History   Socioeconomic History   Marital status: Married    Spouse name: Not on file   Number of children: 0   Years of education: Not on file   Highest education level: Associate degree: occupational, Scientist, product/process development, or vocational program  Occupational History   Occupation: Dentist: [employer]   Tobacco Use   Smoking status: Never   Smokeless tobacco: Never  Vaping Use   Vaping status: Never Used  Substance and Sexual Activity   Alcohol use: Not Currently    Alcohol/week: 4.0 - 7.0 standard drinks of alcohol    Types: 4 - 7 Cans of beer per week     Comment: 1 beer every other ay   Drug use: Not Currently    Types: Marijuana    Comment: edible   Sexual activity: Yes    Partners: Male    Birth control/protection: None  Other Topics Concern   Not on file  Social History Narrative   Caffeine use:  1 can soda daily   Regular exercise:  Just started   2 sisters.      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    Social Drivers of Corporate investment banker Strain: High Risk (08/17/2023)   Overall Financial Resource Strain (CARDIA)    Difficulty of Paying Living Expenses: Hard  Food Insecurity: No Food Insecurity (10/18/2023)   Hunger Vital Sign    Worried About Running Out of Food in the Last Year: Never true    Ran Out of Food in the Last Year: Never true  Recent Concern: Food Insecurity - Food Insecurity Present (08/17/2023)   Hunger Vital Sign    Worried About Running Out of Food in the Last Year: Sometimes true    Ran Out of Food in the Last Year: Sometimes true  Transportation Needs: No Transportation Needs (10/18/2023)   PRAPARE - Administrator, Civil Service (Medical): No    Lack of Transportation (Non-Medical): No  Recent Concern: Transportation Needs - Unmet Transportation Needs (08/17/2023)   PRAPARE - Transportation    Lack of Transportation (Medical): Yes    Lack of Transportation (Non-Medical): Yes  Physical Activity: Unknown (08/17/2023)   Exercise Vital Sign    Days of Exercise per Week: 0 days    Minutes of Exercise per Session: Not on file  Stress: Stress Concern Present (08/17/2023)   [employer] of Occupational Health - Occupational Stress Questionnaire    Feeling of Stress : Very much  Social Connections: Moderately Integrated (08/17/2023)   Social Connection and Isolation Panel [NHANES]    Frequency of Communication with Friends and  Family: More than three times a week    Frequency of Social Gatherings with Friends and Family: Never    Attends Religious Services: More than 4 times per year    Active Member of [employer] or Organizations: No    Attends Banker Meetings: Not on file    Marital Status: Married  Intimate Partner Violence: Not At Risk (10/18/2023)   Humiliation, Afraid, Rape, and Kick questionnaire    Fear  of Current or Ex-Partner: No    Emotionally Abused: No    Physically Abused: No    Sexually Abused: No    Outpatient Medications Prior to Visit  Medication Sig Dispense Refill   albuterol (VENTOLIN HFA) 108 (90 Base) MCG/ACT inhaler Inhale 2 puffs into the lungs every 6 (six) hours as needed for wheezing or shortness of breath. 8 g 1   aspirin-acetaminophen-caffeine (EXCEDRIN MIGRAINE) 250-250-65 MG tablet Take 3 tablets by mouth every 6 (six) hours as needed for headache.     Blood Glucose Monitoring Suppl (BLOOD GLUCOSE MONITOR SYSTEM) w/Device KIT Use to check blood sugar as directed 1 kit 0   busPIRone (BUSPAR) 30 MG tablet Take 1 tablet by mouth twice daily 60 tablet 0   clonazePAM (KLONOPIN) 1 MG tablet Take 1 tablet (1 mg total) by mouth 2 (two) times daily as needed. for anxiety 60 tablet 2   Continuous Glucose Sensor (DEXCOM G7 SENSOR) MISC Use to check blood glucose continuously and assist in administration of short acting insulin / Novolog. 9 each 1   gabapentin (NEURONTIN) 300 MG capsule TAKE 1 CAPSULE BY MOUTH THREE TIMES DAILY 270 capsule 0   Glucose Blood (BLOOD GLUCOSE TEST STRIPS) STRP Use to check blood glucose when indicated by Continuous Glucose Monitor to making dosing decisions. May use up to twice a day 50 strip 2   insulin glargine-yfgn (SEMGLEE) 100 UNIT/ML Pen Inject 22 to 30 units daily (increasing by 2 units every 2 days until fasting blood glucose is < 130).     insulin lispro (HUMALOG KWIKPEN) 100 UNIT/ML KwikPen Use as directed per scale below.  Max daily dose 33  units.  If eating and Blood Glucose (BG) 80 or higher inject 4 units for meal coverage 3 times a day and add correction dose per scale. If not eating, correction dose only. BG <150= 0 unit; BG 150-200= 1 unit; BG 201-250= 3 unit; BG 251-300= 5 unit; BG 301-350= 7 unit; BG 351-400= 9 unit; BG >400= 11 unit and Call Primary Care. 15 mL 1   Insulin Pen Needle (PEN NEEDLES) 31G X 5 MM MISC Use to inject insulin as directed 100 each 0   Lancets MISC Use to check blood sugar three times daily 100 each 0   metFORMIN (GLUCOPHAGE-XR) 500 MG 24 hr tablet Take 1 tablet (500 mg total) by mouth daily for 7 days, THEN 2 tablets (1,000 mg total) daily. Take with largest meal of the day. *Replaces regular release metformin.* 60 tablet 1   methylphenidate (RITALIN) 20 MG tablet Take 1 tablet (20 mg total) by mouth 3 (three) times daily with meals. 90 tablet 0   norgestimate-ethinyl estradiol (ORTHO-CYCLEN) 0.25-35 MG-MCG tablet Take 1 tablet by mouth daily. 84 tablet 1   ondansetron (ZOFRAN-ODT) 4 MG disintegrating tablet Take 1 tablet (4 mg total) by mouth every 6 (six) hours as needed for nausea or vomiting. 30 tablet 3   PARoxetine (PAXIL) 10 MG tablet TAKE 2 TABLETS BY MOUTH IN THE MORNING AND 1 IN THE EVENING 270 tablet 0   tirzepatide (MOUNJARO) 5 MG/0.5ML Pen Inject 5 mg into the skin once a week. Dose increase 2 mL 1   No facility-administered medications prior to visit.    Allergies  Allergen Reactions   Diphenhydramine Hcl Other (See Comments)   Flagyl [Metronidazole] Swelling    Review of Systems  Constitutional:  Positive for malaise/fatigue. Negative for fever.  HENT:  Negative for congestion.   Eyes:  Negative for  blurred vision.  Respiratory:  Negative for shortness of breath.   Cardiovascular:  Negative for chest pain, palpitations and leg swelling.  Gastrointestinal:  Negative for abdominal pain, blood in stool and nausea.  Genitourinary:  Negative for dysuria and frequency.   Musculoskeletal:  Negative for falls.  Skin:  Negative for rash.  Neurological:  Negative for dizziness, loss of consciousness and headaches.  Endo/Heme/Allergies:  Negative for environmental allergies.  Psychiatric/Behavioral:  Positive for depression. Negative for hallucinations and suicidal ideas. The patient is nervous/anxious.        Objective:    Physical Exam Constitutional:      General: She is not in acute distress.    Appearance: Normal appearance. She is not ill-appearing or toxic-appearing.  HENT:     Head: Normocephalic and atraumatic.     Right Ear: External ear normal.     Left Ear: External ear normal.     Nose: Nose normal.  Eyes:     General:        Right eye: No discharge.        Left eye: No discharge.  Pulmonary:     Effort: Pulmonary effort is normal.  Skin:    Findings: No rash.  Neurological:     Mental Status: She is alert and oriented to person, place, and time.  Psychiatric:        Behavior: Behavior normal.     There were no vitals taken for this visit. Wt Readings from Last 3 Encounters:  10/29/23 252 lb 6.4 oz (114.5 kg)  10/20/23 248 lb (112.5 kg)  10/18/23 255 lb (115.7 kg)       Assessment & Plan:  Adult ADHD -     Ambulatory referral to Psychiatry  Anxiety and depression -     ARIPiprazole; Take 1 tablet (2 mg total) by mouth at bedtime.  Dispense: 30 tablet; Refill: 2 -     Ambulatory referral to Psychiatry  Mood disorder (HCC) -     ARIPiprazole; Take 1 tablet (2 mg total) by mouth at bedtime.  Dispense: 30 tablet; Refill: 2 -     Ambulatory referral to Psychiatry     Assessment and Plan Assessment & Plan Mood instability Mood instability attributed to Ritalin use. Prefers Abilify over Lamictal due to a better risk profile and concerns about Lamictal's risks. Abilify is expected to stabilize mood swings and manage Ritalin's effects. Discussed starting Abilify at 2 mg at bedtime, with potential increase to 5 mg if needed.  Abilify is a 24-hour medication, typically prescribed at bedtime to mitigate potential sedation. Referral to psychiatry is planned for further evaluation and management. - Prescribe Abilify 2 mg at bedtime. Monitor response and adjust dosage to 5 mg if needed after one month. - Refer to psychiatry for further evaluation and management of mood instability. - Instruct to report any excessive sedation or adverse effects from Abilify.  Weight management Currently on Mounjaro for weight management. Reports no significant change in appetite, which is expected as the medication requires titration over time. Currently on 5 mg, with potential increase to 7.5 mg after 4-8 weeks if no significant effects are observed. - Continue Mounjaro at 5 mg for 4-8 weeks, then consider increasing to 7.5 mg if no significant effects are observed. - Instruct to send a MyChart message if she wishes to increase the dosage after the initial period.     I discussed the assessment and treatment plan with the patient. The patient was provided an opportunity  to ask questions and all were answered. The patient agreed with the plan and demonstrated an understanding of the instructions.   The patient was advised to call back or seek an in-person evaluation if the symptoms worsen or if the condition fails to improve as anticipated.  Danise Edge, MD [facility] [phone] (phone) [phone] (fax)  [facility]

## 2023-12-02 ENCOUNTER — Other Ambulatory Visit: Payer: Self-pay | Admitting: Emergency Medicine

## 2023-12-02 ENCOUNTER — Other Ambulatory Visit (HOSPITAL_COMMUNITY): Payer: Self-pay

## 2023-12-02 MED ORDER — GABAPENTIN 300 MG PO CAPS: 300.0000 mg | ORAL_CAPSULE | Freq: Three times a day (TID) | ORAL | 0 refills | Status: AC

## 2023-12-02 NOTE — Telephone Encounter (Signed)
 Also this medication is on our $10.00 Drug List with a prescription at any one of our [facility] FYI.

## 2023-12-02 NOTE — Telephone Encounter (Signed)
 Called and spoke with patient. She said she uses the [facility] at her pharmacy. I asked the staff about the prescription drug card and called patient back to get updated information, but there was no answer.

## 2023-12-02 NOTE — Telephone Encounter (Signed)
 Called and spoke with patient. She said she already got her prescription filled for this month.

## 2023-12-02 NOTE — Telephone Encounter (Signed)
 Good morning, we need a prescription drug card in order to process the PA request, I didn't see a current prescription card on file. I will also call [facility] when they open to see what insurance info they have.  Thanks!

## 2023-12-04 ENCOUNTER — Other Ambulatory Visit: Payer: Self-pay | Admitting: Pharmacist

## 2023-12-04 MED ORDER — TIRZEPATIDE 7.5 MG/0.5ML ~~LOC~~ SOAJ: 7.5000 mg | SUBCUTANEOUS | 1 refills | Status: DC

## 2023-12-04 NOTE — Progress Notes (Signed)
 12/04/23 Name: Melissa [name] MRN: [id number] DOB: 09-25-1990  No chief complaint on file.   Melissa [name] is a 33 y.o. year old female who presented for a telephone visit. 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    They were referred to the pharmacist by their PCP for assistance in managing diabetes and medication access.    Subjective: Patient was seen in ED 10/17/2023 and then hospitalized 02/162025 to 10/19/2023 for uncontrolled DM. She is newly diagnosed type 2 DM 10/17/2023 but is currently requiring insulin to control her blood glucose.   Melissa [name] did not have insurance initially but she has been added to her husband's work Tour manager with [employer] for medical and [employer] for pharmacy benefits.   Care Team: Primary Care Provider: Bradd Canary, MD ; Next Scheduled Visit: 12/21/2023 Referred to Endocrinology 02/18 but no appointment set yet She was scheduled with dietician for diabetes classes 3/4, 3/11 and 3/18 and appointment with dietician 02/16/2024  Medication Access/Adherence  Current Pharmacy:  [id number], [place] - [address] [place] [address] Phone: [phone] Fax: [phone]  [place] [facility] - [place], [place] - [address] [place] [address] Phone: [phone] Fax: [phone]  [place] [facility] [address] [place] [address] Phone: [phone] Fax: [phone]  [facility] [address] [place] [address] Phone: [phone] Fax: [phone]   Patient reports affordability concerns with their medications: Yes  Patient reports access/transportation concerns to their pharmacy: No  Patient reports adherence concerns with their medications:  No     Diabetes:  BMI = 46.16  Current medications:  Metformin  XR 500mg  - 2 tablets = 1000mg  once a day in the evening Mounjaro 5mg  weekly - has 1 more dose and then will start 7.5mg  weekly Semglee 20 units once a day - in morning  Patient denies nausea but she did take ondansetron 3 times in the last week but she states this was due to drainage. She reports decreased appetite as expected with Mounjaro.  She reports her period started recently Novolog / Humalog insulin: Currently holding.   Directions are inject 4 units as base amount before meals  (and if blood glucose is 80 or higher) with additional correction if blood glucose if blood glucose is elevated based on sliding scale below.   If not eating, correction dose only.  BG <150= 0 unit;  BG 150-200= 1 unit;  BG 201-250= 3 unit;  BG 251-300= 5 unit;  BG 301-350= 7 unit;  BG 351-400= 9 unit;  BG >400= 11 unit and Call Primary Care.   Medications tried in the past: none  Current weight = 227 lbs Starting weight =  Total weight loss = 28 lbs  Wt Readings from Last 3 Encounters:  10/29/23 252 lb 6.4 oz (114.5 kg)  10/20/23 248 lb (112.5 kg)  10/18/23 255 lb (115.7 kg)    She is using Accu-Chek glucometer if she does not have Continuous Glucose Monitor. She reports that Accu-Chek sent her a free meter.  She has been using Dex Com 7 system for the last 28 days. She prefers DexCom over [place] 3 system.   CGM Documentation:  Days Worn: 14 (recommend 14 days) % Time CGM is active: 88% (goal >=70%) Average Glucose: 96 mg/dL Glucose Management Indicator:  5.6% Glucose Variability: 17.8% (goal <36%) Time in Range:  - Time above range >250: 0% (typical goal: <5%) - Time above range 181-250: 9% (typical goal <20%) - Time in range 70-180 96% (typical goal >=70%) - Time below range 54-69: 3% (typical goal <4%) - Time below range: <1% (typical goal <1%)        Patient reports some hypoglycemic s/sx including dizziness, shakiness, sweating at times but DexCom usually alerts her before she  gets too low. Per patient lows usually occur when she is sleeping  Patient reports hyperglycemic symptoms have improved. States he vision has completely improved. Still having headaches occasionally. Denies. Polyuria, nocturia, neuropathy.   Current meal patterns:  When she is working, meals can be variable but recently she has been training with her new job so meals are more regular.    Patient reports today that she feels so much better lately. Especially her mental health. She feels that having DexCom and knowing what her blood glucose is and starting Mounjaro which has help with weight loss has really helped her physical and mental health.   Current medication access support: none  Patient had questions about timing of her medications when she switched to 3rd shift job. She is not sure when to take diabetes medications or her methylphenidate or aripiprazole.   Objective:  Lab Results  Component Value Date   HGBA1C 12.1 (H) 10/17/2023    Lab Results  Component Value Date   CREATININE 0.68 10/19/2023   BUN 11 10/19/2023   NA 134 (L) 10/19/2023   K 3.9 10/19/2023   CL 101 10/19/2023   CO2 22 10/19/2023    Lab Results  Component Value Date   CHOL 168 06/25/2022   HDL 38.70 (L) 06/25/2022   LDLCALC 104 (H) 06/25/2022   TRIG 125.0 06/25/2022   CHOLHDL 4 06/25/2022    Medications Reviewed Today     Reviewed by Henrene Pastor, RPH-CPP (Pharmacist) on 12/04/23 at [phone]  Med List Status: <None>   Medication Order Taking? Sig Documenting Provider Last Dose Status Informant  albuterol (VENTOLIN HFA) 108 (90 Base) MCG/ACT inhaler [id number]  Inhale 2 puffs into the lungs every 6 (six) hours as needed for wheezing or shortness of breath. Sandford Craze, NP  Active Self, Pharmacy Records           Med Note [facility]   Tue Oct 27, 2023  9:22 AM)    ARIPiprazole (ABILIFY) 2 MG tablet [id number] Yes Take 1 tablet (2 mg total) by mouth at bedtime. Bradd Canary, MD Taking  Active   aspirin-acetaminophen-caffeine [facility] MIGRAINE) [phone] MG tablet [id number]  Take 3 tablets by mouth every 6 (six) hours as needed for headache. [provider]  Active Self, Pharmacy Records  Blood Glucose Monitoring Suppl (BLOOD GLUCOSE MONITOR SYSTEM) w/Device KIT [id number]  Use to check blood sugar as directed Carollee Herter, DO  Active   busPIRone (BUSPAR) 30 MG tablet [id number]  Take 1 tablet by mouth twice daily Bradd Canary, MD  Active   clonazePAM (KLONOPIN) 1 MG tablet [id number]  Take 1 tablet (1 mg total) by mouth 2 (two) times daily as needed. for anxiety Bradd Canary, MD  Active   Continuous Glucose Sensor (DEXCOM G7 SENSOR) MISC [id number] Yes Use to check blood glucose continuously and assist in administration of short acting insulin / Novolog. Bradd Canary, MD Taking Active   gabapentin (NEURONTIN) 300 MG capsule [id number] Yes Take 1 capsule (300 mg total) by  mouth 3 (three) times daily. Bradd Canary, MD Taking Active   Glucose Blood (BLOOD GLUCOSE TEST STRIPS) STRP [id number] Yes Use to check blood glucose when indicated by Continuous Glucose Monitor to making dosing decisions. May use up to twice a day Bradd Canary, MD Taking Active   insulin glargine-yfgn [facility]) 100 UNIT/ML Pen [id number] Yes Inject 22 to 30 units daily (increasing by 2 units every 2 days until fasting blood glucose is < 130). Henrene Pastor, RPH-CPP Taking Active   insulin lispro (HUMALOG KWIKPEN) 100 UNIT/ML KwikPen [id number] No Use as directed per scale below.  Max daily dose 33 units.  If eating and Blood Glucose (BG) 80 or higher inject 4 units for meal coverage 3 times a day and add correction dose per scale. If not eating, correction dose only. BG <150= 0 unit; BG 150-200= 1 unit; BG 201-250= 3 unit; BG 251-300= 5 unit; BG 301-350= 7 unit; BG 351-400= 9 unit; BG >400= 11 unit and Call Primary Care.  Patient not taking: Reported on 12/04/2023   Bradd Canary, MD Not Taking Active    Insulin Pen Needle (PEN NEEDLES) 31G X 5 MM MISC [id number] Yes Use to inject insulin as directed Carollee Herter, DO Taking Active   Lancets MISC [id number] Yes Use to check blood sugar three times daily Carollee Herter, DO Taking Active   metFORMIN (GLUCOPHAGE-XR) 500 MG 24 hr tablet [id number] Yes Take 1 tablet (500 mg total) by mouth daily for 7 days, THEN 2 tablets (1,000 mg total) daily. Take with largest meal of the day. *Replaces regular release metformin.Bradd Canary, MD Taking Active   methylphenidate (RITALIN) 20 MG tablet [id number] Yes Take 1 tablet (20 mg total) by mouth 3 (three) times daily with meals. Bradd Canary, MD Taking Active   norgestimate-ethinyl estradiol (ORTHO-CYCLEN) 0.25-35 MG-MCG tablet [id number] Yes Take 1 tablet by mouth daily. Bradd Canary, MD Taking Active   ondansetron (ZOFRAN-ODT) 4 MG disintegrating tablet [id number] Yes Take 1 tablet (4 mg total) by mouth every 6 (six) hours as needed for nausea or vomiting. Bradd Canary, MD Taking Active   PARoxetine (PAXIL) 10 MG tablet [id number] Yes TAKE 2 TABLETS BY MOUTH IN THE MORNING AND 1 IN THE Bonna Gains, MD Taking Active   tirzepatide [facility]) 5 MG/0.5ML Pen [id number] Yes Inject 5 mg into the skin once a week. Dose increase Bradd Canary, MD Taking Active               Assessment/Plan:  Diabetes: blood glucose per Continuous Glucose Monitor report has been at goal with estimate GMI of 5.6%; She has had some low blood glucose events recently - Continue as planned to increase Mounjaro to 7.5mg  weekly (first dose will be April 14th since she has 1 more dose of 5mg  mg for April 7th) .  - Discontinue Humalog since she has not needed in over 4 weeks.  - Decrease dose of Semglee to 15 units daily (will continue to lower dose as able - patient instructed to contact office if blood glucose is < 70) - Continue to use DexCom 7 sensors - changing every 10 days - Continue metformin ER 500mg  2 tablets  daily.   - Reminded patient to continue birth control pills and additional barrier method to prevent unintended pregnancy with Mounjaro.   Medication Management:  Discussed timing of medications and her 3rd shift work schedule:  Metformin XR - can be taken any time of day but  should be taken with a meal.  Mounjaro - can be taken any time of day but should take on the same day each week Semglee - should have 24 hour effect and can be taken morning or evening but need to monitor for hypoglycemia; My hope is that we will eventaully be able to taper off Semglee Methylphenidate - this should be taken when she plans to be awake to help her with focusing on tasks. Also taking near bedtime could adversely affect her sleep Aripiprazole - recommend she take prior to sleep / bedtime as it can cause drowsiness in some patients.    Follow Up Plan: 7 to 10  days to review Continuous Glucose Monitor.  Henrene Pastor, PharmD Clinical Pharmacist [facility] Primary Care  Population Health [phone]

## 2023-12-09 ENCOUNTER — Encounter: Admitting: Family Medicine

## 2023-12-11 ENCOUNTER — Ambulatory Visit: Admitting: Podiatry

## 2023-12-12 ENCOUNTER — Other Ambulatory Visit (HOSPITAL_COMMUNITY): Payer: Self-pay

## 2023-12-14 ENCOUNTER — Other Ambulatory Visit: Payer: Self-pay | Admitting: Family Medicine

## 2023-12-14 MED ORDER — CLONAZEPAM 1 MG PO TABS: 1.0000 mg | ORAL_TABLET | Freq: Two times a day (BID) | ORAL | 2 refills | Status: DC | PRN

## 2023-12-14 MED ORDER — METHYLPHENIDATE HCL 20 MG PO TABS: 20.0000 mg | ORAL_TABLET | Freq: Three times a day (TID) | ORAL | 0 refills | Status: DC

## 2023-12-14 NOTE — Telephone Encounter (Signed)
 Pt called in stating that she needs multiple medications refilled today. Pt is asking that her mounjaro 10mg , paroxetine, buspirone, methylphenidate, clonazepam, insulin pen needles, glucxose test strips, and zofran( pt picked up refill on Saturday but lost it). Pt was advised this most likely could not be all called in today. Please call pt when medication is called into her usual pharmacy.

## 2023-12-14 NOTE — Telephone Encounter (Signed)
 Copied from CRM [id number]. Topic: Clinical - Medication Refill >> Dec 14, 2023  4:07 PM Orien Bird wrote: ;Most Recent Primary Care Visit:  Provider: Randie Bustle A  Department: [facility]  Visit Type: MYCHART VIDEO VISIT  Date: 12/01/2023  Medication: busPIRone (BUSPAR) 30 MG tablet, clonazePAM (KLONOPIN) 1 MG tablet, tirzepatide (MOUNJARO) 7.5 MG/0.5ML Pen,  Glucose Blood (BLOOD GLUCOSE TEST STRIPS) STRP, Insulin Pen Needle (PEN NEEDLES) 31G X 5 MM MISC, ondansetron (ZOFRAN-ODT) 4 MG disintegrating tablet, methylphenidate (RITALIN) 20 MG tablet, PARoxetine (PAXIL) 10 MG tablet  Has the patient contacted their pharmacy? Yes (Agent: If no, request that the patient contact the pharmacy for the refill. If patient does not wish to contact the pharmacy document the reason why and proceed with request.) (Agent: If yes, when and what did the pharmacy advise?)  Is this the correct pharmacy for this prescription? Yes If no, delete pharmacy and type the correct one.  This is the patient's preferred pharmacy:  [phone] [place] Nestle, [place] - [address] [place] [address] Phone: [phone] Fax: [phone]  Has the prescription been filled recently? No  Is the patient out of the medication? Yes  Has the patient been seen for an appointment in the last year OR does the patient have an upcoming appointment? Yes  Can we respond through MyChart? Yes  Agent: Please be advised that Rx refills may take up to 3 business days. We ask that you follow-up with your pharmacy.

## 2023-12-14 NOTE — Telephone Encounter (Signed)
 Requesting: clonazepam and Ritalin  Contract: None UDS: 10/23/22 Last Visit: 12/01/23 Next Visit: 12/21/23 Last Refill: see med list   Please Advise

## 2023-12-15 ENCOUNTER — Other Ambulatory Visit: Payer: Self-pay | Admitting: Pharmacist

## 2023-12-15 ENCOUNTER — Encounter: Payer: Self-pay | Admitting: Pharmacist

## 2023-12-15 MED ORDER — INSULIN GLARGINE-YFGN 100 UNIT/ML ~~LOC~~ SOPN: 10.0000 [IU] | PEN_INJECTOR | Freq: Every day | SUBCUTANEOUS | Status: DC

## 2023-12-15 MED ORDER — BLOOD GLUCOSE TEST VI STRP
ORAL_STRIP | 1 refills | Status: AC
Start: 2023-12-15 — End: ?

## 2023-12-15 MED ORDER — LANCETS MISC: 1.0000 | Freq: Two times a day (BID) | 1 refills | Status: AC

## 2023-12-15 MED ORDER — FREESTYLE LITE DEVI: 0 refills | Status: AC

## 2023-12-15 MED ORDER — TIRZEPATIDE 10 MG/0.5ML ~~LOC~~ SOAJ: 10.0000 mg | SUBCUTANEOUS | 0 refills | Status: DC

## 2023-12-15 MED ORDER — PEN NEEDLES 31G X 5 MM MISC
1.0000 | Freq: Every day | 0 refills | Status: AC
Start: 2023-12-15 — End: ?

## 2023-12-15 NOTE — Telephone Encounter (Signed)
 I sent the ones that I could - Mounjaro, pen needles, glucose test strips.

## 2023-12-15 NOTE — Progress Notes (Signed)
 12/15/23 Name: Melissa [name] MRN: [id number] DOB: 06/25/1991  No chief complaint on file.   Melissa [name] is a 33 y.o. year old female who presented for a telephone visit. 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    They were referred to the pharmacist by their PCP for assistance in managing diabetes and medication access.    Subjective: Patient was seen in ED 10/17/2023 and then hospitalized 02/162025 to 10/19/2023 for uncontrolled DM. She is newly diagnosed type 2 DM 10/17/2023 but is currently requiring insulin to control her blood glucose.   Melissa [name] did not have insurance initially but she has been added to her husband's work Tour manager with [employer] for medical and [employer] for pharmacy benefits.   Care Team: Primary Care Provider: Bradd Canary, MD ; Next Scheduled Visit: 12/21/2023 Referred to Endocrinology 02/18 but no appointment set yet GYN: Albertine Grates, NP - initial appointment 12/29/2023  Medication Access/Adherence  [facility]:  [phone], [place] - [address] [place] [address] Phone: [phone] Fax: [phone]  [facility] - [place], [place] - [address] [place] [address] Phone: [phone] Fax: [phone]  [place] [facility] [address] [place] [address] Phone: [phone] Fax: [phone]  [facility] - [facility] [address] [place] [address] Phone: [phone] Fax: [phone]   Patient reports affordability concerns with their medications: No  Patient reports access/transportation concerns to their pharmacy: No  Patient reports adherence concerns with their medications:  No     Diabetes:  BMI = 46.16  Current medications:  Metformin XR 500mg  - 2 tablets = 1000mg  once a day in the  evening Mounjaro 5mg  weekly - she took 2 doses of the 5mg  last week by accident. She has tolerated it well, denies nausea Semglee 15 units once a day - in morning (we have been able to lower dose of Semglee since she started [facility])   Patient denies nausea but she did take ondansetron this last week due to having a "GI bug / virus";  She reports decreased appetite as expected with Mounjaro.  She reports her period started recently. She is taking generic Ortho-Cyclen but states that she and her husband are not currently sexually active.   Medications taken in the past: Novolog / Humalog insulin - gradually was able to stop since [place] started.   Current weight = 227 lbs (reported home weight)  Starting weight = 255 lbs Total weight loss = 28 lbs  Wt Readings from Last 3 Encounters:  10/29/23 252 lb 6.4 oz (114.5 kg)  10/20/23 248 lb (112.5 kg)  10/18/23 255 lb (115.7 kg)    She is using Accu-Chek glucometer from time to time but she has a coupon for Freestyle Lite test strips and would like to try to use them.  She has been using Dex Com 7 system. She prefers DexCom over [place] 3 system.   CGM Documentation:  Days Worn: 9/14 (recommend 14 days) % Time CGM is active: 80% (goal >=70%) Average Glucose: 103 mg/dL Glucose Management Indicator: 5.8% Glucose Variability: 14.9% (goal <36%) Time in Range:  - Time above range >250: 0% (typical goal: <5%) - Time above range 181-250: 0% (typical goal <20%) - Time in range 70-180 99% (typical goal >=70%) - Time below range 54-69: 1% (typical  goal <4%) - Time below range: <1% (typical goal <1%)  Compared to previous Continuous Glucose Monitor report, patient has had fewer blood glucose readings < 54 and also fewer (actually no) blood glucose readings > 180.   Patient denies hypoglycemic s/sx including dizziness, shakiness, sweating. I noticed on DexCom report she had a low on Sunday morning abetween 5:30 and 7am. Patient did not really  this. She states she was asleep.  Patient reports hyperglycemic symptoms have improved. States he vision has completely improved. Still having headaches occasionally. Denies. Polyuria, nocturia, neuropathy.   Objective:  Lab Results  Component Value Date   HGBA1C 12.1 (H) 10/17/2023    Lab Results  Component Value Date   CREATININE 0.68 10/19/2023   BUN 11 10/19/2023   NA 134 (L) 10/19/2023   K 3.9 10/19/2023   CL 101 10/19/2023   CO2 22 10/19/2023    Lab Results  Component Value Date   CHOL 168 06/25/2022   HDL 38.70 (L) 06/25/2022   LDLCALC 104 (H) 06/25/2022   TRIG 125.0 06/25/2022   CHOLHDL 4 06/25/2022    Medications Reviewed Today   Medications were not reviewed in this encounter       Assessment/Plan:  Diabetes: blood glucose per Continuous Glucose Monitor report has been at goal with estimate GMI of 5.8%; She has had some low blood glucose events recently - Increase Mounjaro to 10mg  weekly   - Decrease dose of Semglee to 10 units daily (will continue to lower dose as able - patient instructed to contact office if blood glucose is < 70) - Continue to use DexCom 7 sensors - changing every 10 days - Continue metformin ER 500mg  2 tablets daily.   - Reminded patient to continue birth control pills (will see GYN soon to discuss birth control options) and additional barrier method to prevent unintended pregnancy with Mounjaro.   Medication Management:  Updated refills sent for medications listed below.   Meds ordered this encounter  Medications   Glucose Blood (BLOOD GLUCOSE TEST STRIPS) STRP    Sig: Use to check blood glucose when indicated by Continuous Glucose Monitor to making dosing decisions. May use up to twice a day    Dispense:  100 strip    Refill:  1    Patient requested Freestyle Lite test strips (has a coupon)   insulin glargine-yfgn (SEMGLEE) 100 UNIT/ML Pen    Sig: Inject 10-20 Units into the skin daily. (increasing by 2 units every 2 days until  fasting blood glucose is < 130).   Insulin Pen Needle (PEN NEEDLES) 31G X 5 MM MISC    Sig: 1 each by Does not apply route daily. Use to inject insulin once a day    Dispense:  100 each    Refill:  0   Lancets MISC    Sig: 1 each by Does not apply route in the morning and at bedtime.    Dispense:  100 each    Refill:  1   tirzepatide (MOUNJARO) 10 MG/0.5ML Pen    Sig: Inject 10 mg into the skin once a week.    Dispense:  2 mL    Refill:  0    New dose   Blood Glucose Monitoring Suppl (FREESTYLE LITE) DEVI    Sig: Use to check blood glucose as indicated by Continuous Glucose Monitor for confirmation up to twice a day.    Dispense:  1 each    Refill:  0    Hold until  patient requests.    Follow Up Plan: 2 to 4 weeks to adjust insulin and Mounjaro as needed.   Cecilie Coffee, PharmD Clinical Pharmacist [facility] Primary Care  Population Health [phone]

## 2023-12-16 DIAGNOSIS — F332 Major depressive disorder, recurrent severe without psychotic features: Secondary | ICD-10-CM | POA: Diagnosis not present

## 2023-12-16 MED ORDER — BUSPIRONE HCL 30 MG PO TABS: 30.0000 mg | ORAL_TABLET | Freq: Two times a day (BID) | ORAL | 0 refills | Status: DC

## 2023-12-16 MED ORDER — PAROXETINE HCL 10 MG PO TABS: ORAL_TABLET | ORAL | 0 refills | Status: DC

## 2023-12-16 MED ORDER — ONDANSETRON 4 MG PO TBDP: 4.0000 mg | ORAL_TABLET | Freq: Four times a day (QID) | ORAL | 3 refills | Status: DC | PRN

## 2023-12-16 NOTE — Telephone Encounter (Signed)
 Paroxetine and buspirone sent in.  Methylphenidate and clonazepam too soon.

## 2023-12-16 NOTE — Addendum Note (Signed)
 Addended by: Marylou Sobers D on: 12/16/2023 10:20 AM   Modules accepted: Orders

## 2023-12-20 NOTE — Assessment & Plan Note (Deleted)
 No change in dosing in meds today

## 2023-12-20 NOTE — Assessment & Plan Note (Deleted)
 Encourage heart healthy diet such as MIND or DASH diet, increase exercise, avoid trans fats, simple carbohydrates and processed foods, consider a krill or fish or flaxseed oil cap daily.

## 2023-12-20 NOTE — Assessment & Plan Note (Deleted)
Low risk, repeat in 6 months ?

## 2023-12-20 NOTE — Assessment & Plan Note (Deleted)
 Supplement and monitor

## 2023-12-21 ENCOUNTER — Ambulatory Visit: Payer: No Typology Code available for payment source | Admitting: Family Medicine

## 2023-12-21 DIAGNOSIS — E785 Hyperlipidemia, unspecified: Secondary | ICD-10-CM

## 2023-12-21 DIAGNOSIS — F909 Attention-deficit hyperactivity disorder, unspecified type: Secondary | ICD-10-CM

## 2023-12-21 DIAGNOSIS — E559 Vitamin D deficiency, unspecified: Secondary | ICD-10-CM

## 2023-12-21 DIAGNOSIS — Z7984 Long term (current) use of oral hypoglycemic drugs: Secondary | ICD-10-CM

## 2023-12-23 ENCOUNTER — Other Ambulatory Visit: Payer: Self-pay | Admitting: Family Medicine

## 2023-12-23 ENCOUNTER — Other Ambulatory Visit (HOSPITAL_COMMUNITY): Payer: Self-pay

## 2023-12-23 DIAGNOSIS — F332 Major depressive disorder, recurrent severe without psychotic features: Secondary | ICD-10-CM | POA: Diagnosis not present

## 2023-12-23 DIAGNOSIS — F419 Anxiety disorder, unspecified: Secondary | ICD-10-CM

## 2023-12-23 DIAGNOSIS — F39 Unspecified mood [affective] disorder: Secondary | ICD-10-CM

## 2023-12-23 MED ORDER — METFORMIN HCL ER 500 MG PO TB24
ORAL_TABLET | ORAL | 1 refills | Status: AC
  Filled 2023-12-23: qty 53, 30d supply, fill #0
  Filled 2023-12-29: qty 60, 33d supply, fill #0
  Filled 2024-02-03 (×2): qty 60, 1d supply, fill #1
  Filled 2024-03-17 – 2024-04-12 (×2): qty 60, 30d supply, fill #1

## 2023-12-24 ENCOUNTER — Other Ambulatory Visit (HOSPITAL_COMMUNITY): Payer: Self-pay

## 2023-12-25 DIAGNOSIS — F332 Major depressive disorder, recurrent severe without psychotic features: Secondary | ICD-10-CM | POA: Diagnosis not present

## 2023-12-28 ENCOUNTER — Other Ambulatory Visit: Payer: Self-pay | Admitting: Family Medicine

## 2023-12-28 DIAGNOSIS — F39 Unspecified mood [affective] disorder: Secondary | ICD-10-CM

## 2023-12-28 DIAGNOSIS — F32A Depression, unspecified: Secondary | ICD-10-CM

## 2023-12-29 ENCOUNTER — Other Ambulatory Visit (HOSPITAL_COMMUNITY): Payer: Self-pay

## 2023-12-29 ENCOUNTER — Encounter: Payer: Self-pay | Admitting: Obstetrics and Gynecology

## 2023-12-29 ENCOUNTER — Ambulatory Visit (INDEPENDENT_AMBULATORY_CARE_PROVIDER_SITE_OTHER): Admitting: Obstetrics and Gynecology

## 2023-12-29 ENCOUNTER — Other Ambulatory Visit (HOSPITAL_BASED_OUTPATIENT_CLINIC_OR_DEPARTMENT_OTHER): Payer: Self-pay

## 2023-12-29 ENCOUNTER — Other Ambulatory Visit (HOSPITAL_COMMUNITY)
Admission: RE | Admit: 2023-12-29 | Discharge: 2023-12-29 | Disposition: A | Discharge status: Home / Self Care | Source: Ambulatory Visit | Attending: Obstetrics and Gynecology | Admitting: Obstetrics and Gynecology

## 2023-12-29 VITALS — BP 110/71 | HR 88 | Ht 62.0 in | Wt 220.0 lb

## 2023-12-29 DIAGNOSIS — Z01419 Encounter for gynecological examination (general) (routine) without abnormal findings: Secondary | ICD-10-CM

## 2023-12-29 DIAGNOSIS — Z113 Encounter for screening for infections with a predominantly sexual mode of transmission: Secondary | ICD-10-CM

## 2023-12-29 DIAGNOSIS — L680 Hirsutism: Secondary | ICD-10-CM

## 2023-12-29 DIAGNOSIS — Z124 Encounter for screening for malignant neoplasm of cervix: Secondary | ICD-10-CM

## 2023-12-29 DIAGNOSIS — F332 Major depressive disorder, recurrent severe without psychotic features: Secondary | ICD-10-CM | POA: Diagnosis not present

## 2023-12-29 NOTE — Progress Notes (Signed)
 ANNUAL EXAM Patient name: Melissa [name] MRN [id number]  Date of birth: 1991-05-04 Chief Complaint:   No chief complaint on file.  History of Present Illness:   Melissa [name] is a 33 y.o. G0P0000  female being seen today for a routine annual exam.  Current complaints: annual exam, reports hx excessive hair growth chin, underarm, abdomen.  Just started taking OCP last month, reports not taking regularly. Unsure if she desires fertility  Patient's last menstrual period was 11/14/2023 (approximate).   The pregnancy intention screening data noted above was reviewed. Potential methods of contraception were discussed. The patient elected to proceed with No data recorded.   Last pap 2021. Results were:  NILM . H/O abnormal pap: no Last mammogram: n/a. Results were: N/A. Family h/o breast cancer: maternal great aunt       02/04/2022    2:00 PM 02/07/2021    4:38 PM 11/14/2019    3:11 PM 01/31/2016    1:24 PM  Depression screen PHQ 2/9  Decreased Interest 3 3 3  0  Down, Depressed, Hopeless 3 3 3  0  PHQ - 2 Score 6 6 6  0  Altered sleeping 3 3 3    Tired, decreased energy 3 3 3    Change in appetite 3 3 3    Feeling bad or failure about yourself  3 3 3    Trouble concentrating 3 3 3    Moving slowly or fidgety/restless 0 0 3   Suicidal thoughts 0 0 0   PHQ-9 Score 21 21 24    Difficult doing work/chores Not difficult at all Somewhat difficult Extremely dIfficult         02/04/2022    2:01 PM  GAD 7 : Generalized Anxiety Score  Nervous, Anxious, on Edge 3  Control/stop worrying 3  Worry too much - different things 3  Trouble relaxing 3  Restless 3  Easily annoyed or irritable 3  Afraid - awful might happen 3  Total GAD 7 Score 21  Anxiety Difficulty Not difficult at all     Review of Systems:   Pertinent items are noted in HPI Denies any headaches, blurred vision, fatigue, shortness of breath, chest pain, abdominal pain, abnormal vaginal discharge/itching/odor/irritation, problems  with periods, bowel movements, urination, or intercourse unless otherwise stated above. Pertinent History Reviewed:  Reviewed past medical,surgical, social and family history.  Reviewed problem list, medications and allergies. Physical Assessment:   Vitals:   12/29/23 1114  BP: 110/71  Pulse: 88  Weight: 220 lb (99.8 kg)  Height: 5\' 2"  (1.575 m)  Body mass index is 40.24 kg/m.        Physical Examination:   General appearance - well appearing, and in no distress  Mental status - alert, oriented   Psych:  She has a normal mood and affect  Skin - warm and dry, normal color, no suspicious lesions noted  Chest - effort normal, all lung fieldsclear  Heart - normal rate and regular rhythm  Neck:  midline trachea, no thyromegaly or nodules  Breasts - breasts appear normal, no suspicious masses, no skin or nipple changes or  axillary nodes  Abdomen - soft, nontender, nondistended, no masses or organomegaly  Pelvic - VULVA: normal appearing vulva with no masses, tenderness or lesions  VAGINA: normal appearing vagina with normal color and discharge, no lesions  CERVIX: normal appearing cervix without discharge or lesions, no CMT  Thin prep pap is done w HR HPV cotesting  UTERUS: uterus is felt to be normal size, shape, consistency  and nontender   ADNEXA: No adnexal masses or tenderness noted.  Extremities:  No swelling or varicosities noted  Chaperone present for exam  No results found for this or any previous visit (from the past 24 hours).  Assessment & Plan:  1. Encounter for annual routine gynecological examination (Primary) Pap today Mammogram at 38  - Cytology - PAP( [place])  2. Cervical cancer screening  - Cytology - PAP( [place])  3. Screen for STD (sexually transmitted disease)  - Cervicovaginal ancillary only( [place]) - HIV Antibody (routine testing w rflx) - RPR - Hepatitis C Antibody - Hepatitis B Surface AntiGEN   Labs/procedures today:    Mammogram: @ 35yo, or sooner if problems Colonoscopy: @ 35yo, or sooner if problems  Orders Placed This Encounter  Procedures   HIV Antibody (routine testing w rflx)   RPR   Hepatitis C Antibody   Hepatitis B Surface AntiGEN    Meds: No orders of the defined types were placed in this encounter.   Follow-up: Return in about 1 year (around 12/28/2024) for Zoanne Hinders, FNP

## 2023-12-30 LAB — CERVICOVAGINAL ANCILLARY ONLY
Bacterial Vaginitis (gardnerella): NEGATIVE
Candida Glabrata: NEGATIVE
Candida Vaginitis: NEGATIVE
Chlamydia: NEGATIVE
Comment: NEGATIVE
Comment: NEGATIVE
Comment: NEGATIVE
Comment: NEGATIVE
Comment: NEGATIVE
Comment: NORMAL
Neisseria Gonorrhea: NEGATIVE
Trichomonas: NEGATIVE

## 2023-12-30 LAB — HEPATITIS C ANTIBODY: Hep C Virus Ab: NONREACTIVE

## 2023-12-30 LAB — RPR: RPR Ser Ql: NONREACTIVE

## 2023-12-30 LAB — HIV ANTIBODY (ROUTINE TESTING W REFLEX): HIV Screen 4th Generation wRfx: NONREACTIVE

## 2023-12-30 LAB — HEPATITIS B SURFACE ANTIGEN: Hepatitis B Surface Ag: NEGATIVE

## 2023-12-31 LAB — CYTOLOGY - PAP
Comment: NEGATIVE
Diagnosis: NEGATIVE
High risk HPV: NEGATIVE

## 2024-01-05 ENCOUNTER — Encounter: Payer: Self-pay | Admitting: Pharmacist

## 2024-01-05 NOTE — Progress Notes (Unsigned)
 Opened in error: erroneous encounter.

## 2024-01-06 ENCOUNTER — Other Ambulatory Visit: Payer: Self-pay | Admitting: Family Medicine

## 2024-01-06 DIAGNOSIS — F332 Major depressive disorder, recurrent severe without psychotic features: Secondary | ICD-10-CM | POA: Diagnosis not present

## 2024-01-07 MED ORDER — METHYLPHENIDATE HCL 20 MG PO TABS: 20.0000 mg | ORAL_TABLET | Freq: Three times a day (TID) | ORAL | 0 refills | Status: DC

## 2024-01-07 NOTE — Telephone Encounter (Signed)
 Requesting: Ritalin  20mg   Contract: None UDS: 10/23/22 Last Visit: 12/01/23 Next Visit: None Last Refill: 12/14/23 #90 and 0RF   Please Advise

## 2024-01-08 ENCOUNTER — Other Ambulatory Visit: Payer: Self-pay | Admitting: Family Medicine

## 2024-01-08 MED ORDER — TIRZEPATIDE 10 MG/0.5ML ~~LOC~~ SOAJ: 10.0000 mg | SUBCUTANEOUS | 0 refills | Status: DC

## 2024-01-09 ENCOUNTER — Encounter: Payer: Self-pay | Admitting: Emergency Medicine

## 2024-01-09 ENCOUNTER — Telehealth

## 2024-01-12 ENCOUNTER — Other Ambulatory Visit (HOSPITAL_COMMUNITY): Payer: Self-pay

## 2024-01-12 ENCOUNTER — Ambulatory Visit (HOSPITAL_BASED_OUTPATIENT_CLINIC_OR_DEPARTMENT_OTHER): Payer: Self-pay | Admitting: Family

## 2024-01-12 VITALS — BP 110/79 | HR 106 | Ht 61.5 in | Wt 220.0 lb

## 2024-01-12 DIAGNOSIS — F39 Unspecified mood [affective] disorder: Secondary | ICD-10-CM | POA: Diagnosis not present

## 2024-01-12 DIAGNOSIS — R41840 Attention and concentration deficit: Secondary | ICD-10-CM | POA: Diagnosis not present

## 2024-01-12 DIAGNOSIS — F419 Anxiety disorder, unspecified: Secondary | ICD-10-CM | POA: Diagnosis not present

## 2024-01-12 DIAGNOSIS — F332 Major depressive disorder, recurrent severe without psychotic features: Secondary | ICD-10-CM | POA: Diagnosis not present

## 2024-01-12 DIAGNOSIS — F32A Depression, unspecified: Secondary | ICD-10-CM | POA: Diagnosis not present

## 2024-01-12 MED ORDER — ARIPIPRAZOLE 5 MG PO TABS
5.0000 mg | ORAL_TABLET | Freq: Every day | ORAL | 1 refills | Status: DC
  Filled 2024-01-12: qty 30, 30d supply, fill #0

## 2024-01-12 MED ORDER — LAMOTRIGINE 25 MG PO TABS
ORAL_TABLET | ORAL | 0 refills | Status: DC
  Filled 2024-01-12: qty 90, 43d supply, fill #0

## 2024-01-12 MED ORDER — LAMOTRIGINE 25 MG PO TABS
ORAL_TABLET | ORAL | 0 refills | Status: DC
  Filled 2024-01-12: qty 53, 30d supply, fill #0

## 2024-01-12 MED ORDER — PAROXETINE HCL 10 MG PO TABS
ORAL_TABLET | ORAL | 0 refills | Status: DC
  Filled 2024-01-12: qty 270, 90d supply, fill #0

## 2024-01-12 MED ORDER — ATOMOXETINE HCL 10 MG PO CAPS
10.0000 mg | ORAL_CAPSULE | Freq: Every day | ORAL | 0 refills | Status: DC
  Filled 2024-01-12: qty 30, 30d supply, fill #0

## 2024-01-12 NOTE — Addendum Note (Signed)
 Addended by: Levester Reagin on: 01/12/2024 04:05 PM   Modules accepted: Level of Service
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
## 2024-01-14 ENCOUNTER — Telehealth: Payer: Self-pay

## 2024-01-14 DIAGNOSIS — Z7984 Long term (current) use of oral hypoglycemic drugs: Secondary | ICD-10-CM

## 2024-01-14 NOTE — Telephone Encounter (Signed)
 Copied from CRM [id number]. Topic: Clinical - Medication Question >> Jan 14, 2024 10:59 AM Juleen Oakland F wrote: Reason for CRM: Patient wants to know if Dr. Rodrick Clapper will prescribe Lisdexamfetamine medication at 60-70mg  since the 50mg  is not covered by her insurance. Please call her at [phone] (H)

## 2024-01-18 NOTE — Telephone Encounter (Signed)
 Patient was advised and scheduled a MyChart virtual visit on 02/01/24 @ 9:00 AM the only visit she could do. She reports also send a message to Dr. Rodrick Clapper and message not in the chart.

## 2024-01-19 ENCOUNTER — Telehealth (INDEPENDENT_AMBULATORY_CARE_PROVIDER_SITE_OTHER): Admitting: Student

## 2024-01-19 ENCOUNTER — Encounter: Payer: Self-pay | Admitting: Pharmacist

## 2024-01-19 ENCOUNTER — Encounter: Payer: Self-pay | Admitting: Student

## 2024-01-19 DIAGNOSIS — F39 Unspecified mood [affective] disorder: Secondary | ICD-10-CM | POA: Diagnosis not present

## 2024-01-19 DIAGNOSIS — F32A Depression, unspecified: Secondary | ICD-10-CM

## 2024-01-19 DIAGNOSIS — F419 Anxiety disorder, unspecified: Secondary | ICD-10-CM | POA: Diagnosis not present

## 2024-01-19 NOTE — Assessment & Plan Note (Addendum)
 Patient recently had initial psych evaluation and was started on Strattera  10 mg, and Lamotrigine  (Lamictal ) 25 mg to taper to 50 mg by behavioral health.  Based on todays visit, recent [facility] visit and discussion with Dr. Nathalie Baize, MD, the patient appears to be exhibiting a clinical change that is not consistent with prior presentations.Per the recommendation of Behavioral Health and Dr. Nathalie Baize, MD, Ritalin  was discontinued today. Per behavioral health guidance, the patient was encouraged to follow up for comprehensive psychological testing for adult ADHD through [facility] and/or [facility]. She is requesting a stimulant medication for ADHD; however, given the recent clinical changes and atypical behaviors observed during today's visit, I concur with BH that a comprehensive psychiatric evaluation is warranted to reassess ADHD diagnosis, medication management, and the appropriateness of stimulant use.   Patient was encouraged to schedule an in-person visit to discuss concerns further and allow for updated UDS.

## 2024-01-19 NOTE — Progress Notes (Addendum)
 Subjective:     Patient ID: Melissa [name], female    DOB: Nov 17, 1990, 33 y.o.   MRN: [id number]  Chief Complaint  Patient presents with   discuss your medication    HPI Melissa [name] present for video visit  Video Visit Patient location: Patient actively driving at the start of the video visit   I discussed the limitations of evaluation and management by telemedicine and the availability of in person appointments. The patient expressed understanding and agreed to proceed.  Patient was actively driving vehicle at the start of the video visit. Patient instructed to call back once safely parked to ensure her safety during encounter. Upon reconnecting while parked, audio issues prevented video communication, and the remainder of the visit was completed via telephone.  She recently completed an initial assessment with [facility]) on 01/12/24. During this visit, she was started on  -Strattera  (non-stimulant) 10 mg daily  -Lamotrigine  (Lamictal ) 25 mg to taper to 50 mg.   Abilify  2 mg was discontinued at this visit. Per [facility] NP note- patient reported minimal efficacy with Abilify .  She requested during video visit today to restart Abilify . She reports that Strattera  has not been ineffective, and requesting stimulant medication for reported ADHD (Vyvanse ). Patient stated she does not wish to follow up with psychiatry and disagrees with the psychiatrist's assessment and treatment plan  Patient reports mood instability, describing frequent fluctuations and episodes of tearfulness throughout the day. She expresses disbelief regarding mood disorder diagnosis and states she does not understand why Lamictal  was prescribed by [facility].  History of Present Illness              Health Maintenance Due  Topic Date Due   FOOT EXAM  Never done   Diabetic kidney evaluation - Urine ACR  Never done   COVID-19 Vaccine (4 - 2024-25 season) 05/03/2023    Past Medical History:   Diagnosis Date   Abnormal Pap smear of cervix 07/11/2011   Acid reflux    Anxiety    Asthma    activity induced   Diabetes mellitus without complication (HCC)    Hip abductor tendinitis 01/23/2015   Irregular menses    Polycystic ovarian syndrome     Past Surgical History:  Procedure Laterality Date   NO PAST SURGERIES      Family History  Problem Relation Age of Onset   Gallbladder disease Mother    Colon polyps Mother    Hypertension Father    Other Father        Hepatitis A   Diabetes Father    Drug abuse Father    Post-traumatic stress disorder Father    Stomach cancer Maternal Grandmother    Diabetes Maternal Grandmother    Colon polyps Maternal Grandmother    Colon cancer Maternal Grandmother    Cancer Maternal Grandmother        stomach cancer   Alcohol abuse Maternal Grandmother    Emphysema Maternal Grandfather    Cancer Maternal Grandfather        smoke, lung cancer   Alcohol abuse Maternal Grandfather    Heart disease Paternal Grandfather    Alcohol abuse Paternal Grandfather    Breast cancer Other        mat great aunts x 2   Colon cancer Other        mat great uncles x 2   Irritable bowel syndrome Sister    Multiple sclerosis Sister    Bipolar disorder Sister  Colon polyps Paternal Uncle    Alcohol abuse Paternal Grandmother    Pancreatic cancer Neg Hx    Esophageal cancer Neg Hx     Social History   Socioeconomic History   Marital status: Married    Spouse name: Not on file   Number of children: 0   Years of education: Not on file   Highest education level: Associate degree: occupational, Scientist, product/process development, or vocational program  Occupational History   Occupation: Dentist: [employer]   Tobacco Use   Smoking status: Never   Smokeless tobacco: Never  Vaping Use   Vaping status: Never Used  Substance and Sexual Activity   Alcohol use: Not Currently    Alcohol/week: 4.0 - 7.0 standard drinks of alcohol    Types: 4 - 7 Cans  of beer per week    Comment: 1 beer every other ay   Drug use: Not Currently    Types: Marijuana    Comment: edible   Sexual activity: Yes    Partners: Male    Birth control/protection: None  Other Topics Concern   Not on file  Social History Narrative   Caffeine  use:  1 can soda daily   Regular exercise:  Just started   2 sisters.      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    Social Drivers of Corporate investment banker Strain: High Risk (08/17/2023)   Overall Financial Resource Strain (CARDIA)    Difficulty of Paying Living Expenses: Hard  Food Insecurity: No Food Insecurity (10/18/2023)   Hunger Vital Sign    Worried About Running Out of Food in the Last Year: Never true    Ran Out of Food in the Last Year: Never true  Recent Concern: Food Insecurity - Food Insecurity Present (08/17/2023)   Hunger Vital Sign    Worried About Running Out of Food in the Last Year: Sometimes true    Ran Out of Food in the Last Year: Sometimes true  Transportation Needs: No Transportation Needs (10/18/2023)   PRAPARE - Administrator, Civil Service (Medical): No    Lack of Transportation (Non-Medical): No  Recent Concern: Transportation Needs - Unmet Transportation Needs (08/17/2023)   PRAPARE - Transportation    Lack of Transportation (Medical): Yes    Lack of Transportation (Non-Medical): Yes  Physical Activity: Unknown (08/17/2023)   Exercise Vital Sign    Days of Exercise per Week: 0 days    Minutes of Exercise per Session: Not on file  Stress: Stress Concern Present (08/17/2023)   [employer] of Occupational Health - Occupational Stress Questionnaire    Feeling of Stress : Very much  Social Connections: Moderately Integrated (08/17/2023)   Social Connection and Isolation Panel [NHANES]    Frequency of Communication  with Friends and Family: More than three times a week    Frequency of Social Gatherings with Friends and Family: Never    Attends Religious Services: More than 4 times per year    Active Member of [employer] or Organizations: No    Attends Banker Meetings: Not on file    Marital Status: Married  Intimate Partner Violence: Not At Risk (10/18/2023)   Humiliation, Afraid, Rape, and Kick questionnaire    Fear of Current or Ex-Partner: No  Emotionally Abused: No    Physically Abused: No    Sexually Abused: No    Outpatient Medications Prior to Visit  Medication Sig Dispense Refill   albuterol  (VENTOLIN  HFA) 108 (90 Base) MCG/ACT inhaler Inhale 2 puffs into the lungs every 6 (six) hours as needed for wheezing or shortness of breath. 8 g 1   aspirin-acetaminophen -caffeine  (EXCEDRIN MIGRAINE) 250-250-65 MG tablet Take 3 tablets by mouth every 6 (six) hours as needed for headache.     atomoxetine  (STRATTERA ) 10 MG capsule Take 1 capsule (10 mg total) by mouth daily. 30 capsule 0   Blood Glucose Monitoring Suppl (BLOOD GLUCOSE MONITOR SYSTEM) w/Device KIT Use to check blood sugar as directed (Patient taking differently: 1 each by Does not apply route 3 (three) times daily. Freestyle Lite) 1 kit 0   Blood Glucose Monitoring Suppl (FREESTYLE LITE) DEVI Use to check blood glucose as indicated by Continuous Glucose Monitor for confirmation up to twice a day. 1 each 0   busPIRone  (BUSPAR ) 30 MG tablet TAKE 1 TABLET BY MOUTH 2 TIMES DAILY. 180 tablet 1   clonazePAM  (KLONOPIN ) 1 MG tablet Take 1 tablet (1 mg total) by mouth 2 (two) times daily as needed. for anxiety 60 tablet 2   gabapentin  (NEURONTIN ) 300 MG capsule Take 1 capsule (300 mg total) by mouth 3 (three) times daily. 270 capsule 0   Glucose Blood (BLOOD GLUCOSE TEST STRIPS) STRP Use to check blood glucose when indicated by Continuous Glucose Monitor to making dosing decisions. May use up to twice a day 100 strip 1   insulin  glargine-yfgn  (SEMGLEE ) 100 UNIT/ML Pen Inject 10-20 Units into the skin daily. (increasing by 2 units every 2 days until fasting blood glucose is < 130).     Insulin  Pen Needle (PEN NEEDLES) 31G X 5 MM MISC 1 each by Does not apply route daily. Use to inject insulin  once a day 100 each 0   lamoTRIgine  (LAMICTAL ) 25 MG tablet Take 1 tablet (25 mg total) by mouth daily for 7 days, THEN 2 tablets (50 mg total) daily. 67 tablet 0   Lancets MISC 1 each by Does not apply route in the morning and at bedtime. 100 each 1   metFORMIN  (GLUCOPHAGE -XR) 500 MG 24 hr tablet Take 1 tablet (500 mg total) by mouth daily for 7 days, THEN 2 tablets (1,000 mg total) daily. Take with largest meal of the day. *Replaces regular release metformin .* 60 tablet 1   norgestimate -ethinyl estradiol  (ORTHO-CYCLEN) 0.25-35 MG-MCG tablet Take 1 tablet by mouth daily. 84 tablet 1   ondansetron  (ZOFRAN -ODT) 4 MG disintegrating tablet Take 1 tablet (4 mg total) by mouth every 6 (six) hours as needed for nausea or vomiting. 30 tablet 3   PARoxetine  (PAXIL ) 10 MG tablet Take 2 tablets (20 mg total) by mouth in the morning AND 1 tablet (10 mg total) every evening. 270 tablet 0   tirzepatide (MOUNJARO) 10 MG/0.5ML Pen Inject 10 mg into the skin once a week. 2 mL 0   methylphenidate  (RITALIN ) 20 MG tablet Take 1 tablet (20 mg total) by mouth 3 (three) times daily with meals. 90 tablet 0   Continuous Glucose Sensor (DEXCOM G7 SENSOR) MISC Use to check blood glucose continuously and assist in administration of short acting insulin  / Novolog . (Patient not taking: Reported on 01/19/2024) 9 each 1   No facility-administered medications prior to visit.    Allergies  Allergen Reactions   Diphenhydramine  Hcl Other (See Comments)   Flagyl  [Metronidazole ]  Swelling    ROS     Objective:     Physical Exam Patient appeared well-nourished, in no acute distress.  Respirations normal; no signs of respiratory distress. Normal rate. Respiratory: breathing  unlabored, no coughing or wheezing observed.  Psych: Speech pressured and rapid, with a labile affect. Thought processes appeared disorganized. Judgment and impulse control appeared impaired based on observed interactions. Confrontational behavior with minimal cooperation throughout encounter.   LMP 11/14/2023 (Approximate)  Wt Readings from Last 3 Encounters:  01/12/24 220 lb (99.8 kg)  12/29/23 220 lb (99.8 kg)  10/29/23 252 lb 6.4 oz (114.5 kg)       Assessment & Plan:   Problem List Items Addressed This Visit     Anxiety and depression - Primary   Continue Paxil  30 mg. Patient was encouraged to maintain follow-up with Behavioral Health for ongoing psychiatric medication management to ensure continuity of care and optimal treatment regimen      Mood disorder [facility])   Patient recently had initial psych evaluation and was started on Strattera  10 mg, and Lamotrigine  (Lamictal ) 25 mg to taper to 50 mg by behavioral health.  Based on todays visit, recent [facility] visit and discussion with Dr. Nathalie Baize, MD, the patient appears to be exhibiting a clinical change that is not consistent with prior presentations.Per the recommendation of Behavioral Health and Dr. Nathalie Baize, MD, Ritalin  was discontinued today. Per behavioral health guidance, the patient was encouraged to follow up for comprehensive psychological testing for adult ADHD through [facility] and/or [facility]. She is requesting a stimulant medication for ADHD; however, given the recent clinical changes and atypical behaviors observed during today's visit, I concur with BH that a comprehensive psychiatric evaluation is warranted to reassess ADHD diagnosis, medication management, and the appropriateness of stimulant use.   Patient was encouraged to schedule an in-person visit to discuss concerns further and allow for updated UDS.          I have discontinued Melissa [name]'s methylphenidate . I am also having her maintain her  albuterol , aspirin-acetaminophen -caffeine , Blood Glucose Monitor System, norgestimate -ethinyl estradiol , Dexcom G7 Sensor, gabapentin , clonazePAM , BLOOD GLUCOSE TEST STRIPS, insulin  glargine-yfgn, Pen Needles, Lancets, FreeStyle Lite, ondansetron , metFORMIN , tirzepatide, busPIRone , PARoxetine , atomoxetine , and lamoTRIgine .  No orders of the defined types were placed in this encounter.

## 2024-01-19 NOTE — Assessment & Plan Note (Addendum)
 Continue Paxil  30 mg. Patient was encouraged to maintain follow-up with Behavioral Health for ongoing psychiatric medication management to ensure continuity of care and optimal treatment regimen

## 2024-01-21 ENCOUNTER — Other Ambulatory Visit: Payer: Self-pay

## 2024-01-21 ENCOUNTER — Telehealth: Payer: Self-pay | Admitting: *Deleted

## 2024-01-21 DIAGNOSIS — F332 Major depressive disorder, recurrent severe without psychotic features: Secondary | ICD-10-CM | POA: Diagnosis not present

## 2024-01-21 MED ORDER — INSULIN GLARGINE-YFGN 100 UNIT/ML ~~LOC~~ SOPN
10.0000 [IU] | PEN_INJECTOR | Freq: Every day | SUBCUTANEOUS | 3 refills | Status: DC
Start: 2024-01-21 — End: 2024-06-01

## 2024-01-21 NOTE — Telephone Encounter (Signed)
 Patient scheduled for 01/28/24

## 2024-01-21 NOTE — Telephone Encounter (Signed)
 Patient was already advised via another message and scheduled for appointment.

## 2024-01-21 NOTE — Telephone Encounter (Signed)
-----   Message from Nurse Jullie Oiler B sent at 01/20/2024  2:03 PM EDT -----  ----- Message ----- From: Neda Balk, MD Sent: 01/19/2024   9:52 PM EDT To: Glenys Lapine, RN; Mammie Sears McClurkin, CMA; #  Agree with discontinuation of stimulants until patient is further evaluated per psychiatry recommendations. Patient baseline does not include rapid speech and high energy so this is a definite clinical change. Will have nursing reach out and let her know I would like to see her soon and if she is not feeling herself she should present to the [facility] hospital for further treatment ----- Message ----- From: Jackqueline Mason, NP Sent: 01/19/2024   5:18 PM EDT To: Neda Balk, MD  Dr. Nathalie Baize, I had a video visit with Raegen today and noted that her behavior and affect were notably different from prior encounters. She appeared frantic, with pressured and rapid speech, and was irritable, confrontational, and visibly distressed throughout the conversation. Her overall presentation was significantly heightened compared to her baseline demeanor. Per BH/ Psych A/P on 01/12/24:  -Continue Paxil  30 mg daily.Discontinued Abilify  2 mg as she reports minimal efficacy with medication. -Initiated Lamictal  25 mg to taper up to 50 mg education provided with signs and symptoms with rash -Discussed following up for psychological testing for adult ADHD testing through [facility]or [facility]. - Consideration for discontinuing Ritalin . (Patient observed talking to herself throughout this assessment)  The patient appeared notably irritable during today's video visit. Given the atypical behavior observed and in alignment with behavioral health recommendations, I attempted to address her questions and concerns, but was repeatedly interrupted and met with increasing hostility. Based on her presentation and ongoing concerns regarding stimulant use, I believe it is in the  patient's best interest to undergo further evaluation with [facility] and/or [facility]. If you have any differing recommendations or concerns, please let me know so we can best coordinate care and support this patient effectively.

## 2024-01-22 ENCOUNTER — Encounter: Payer: Self-pay | Admitting: Pharmacist

## 2024-01-22 ENCOUNTER — Other Ambulatory Visit: Payer: Self-pay | Admitting: Pharmacist

## 2024-01-22 MED ORDER — MOUNJARO 12.5 MG/0.5ML ~~LOC~~ SOAJ: 12.5000 mg | SUBCUTANEOUS | 0 refills | Status: DC

## 2024-01-22 NOTE — Progress Notes (Signed)
 01/22/24 Name: Melissa [name] MRN: [id number] DOB: 11-Feb-1991  Chief Complaint  Patient presents with   Diabetes   Medication Management    Melissa [name] is a 33 y.o. year old female who presented for a telephone visit. 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    They were referred to the pharmacist by their PCP for assistance in managing diabetes and medication access.    Subjective: Patient was seen in ED 10/17/2023 and then hospitalized 02/162025 to 10/19/2023 for uncontrolled DM. She is newly diagnosed type 2 DM 10/17/2023. She initially was started on a basal / bolus insulin  regimen but has been able to stop using bolus insulin .  Melissa [name] did not have insurance initially but she has been added to her husband's work Tour manager with [employer] for medical and [employer] for pharmacy benefits.   Care Team: Primary Care Provider: Neda Balk, MD ; Next Scheduled Visit: 01/28/2024 Referred to Endocrinology 02/18 but no appointment set yet GYN: Susi Eric, NP - initial appointment 01/26/2024  Medication Access/Adherence  Current Pharmacy:  [id number] - [place], [place] - [address] [place] [address] Phone: [phone] Fax: [phone]   Patient reports affordability concerns with their medications: No  Patient reports access/transportation concerns to their pharmacy: No  Patient reports adherence concerns with their medications:  No     Diabetes:  Current medications:  Metformin  XR 500mg  - 2 tablets = 1000mg  once a day in the evening Mounjaro 10mg  weekly - She has tolerated it well, denies nausea Semglee  14 units once a day - in morning (we have been able to lower dose of Semglee  since she started Mounjaro)   Patient denies nausea - she has not needed to take ondansetron  in the last 2 weeks.  She reports decreased appetite with Mounjaro but only for the first 3 days, then appetite returns She is  taking generic Ortho-Cyclen but she was interested in getting pregnant and was to see gyn. She reports today that she has changed her mind and doesn't want to have another child. She also reports that she and her husband are not currently sexually active.   Medications taken in the past: Novolog  / Humalog  insulin  - gradually was able to stop since Mounjaro started.   Current weight = 217 lbs (reported home weight today)  Starting weight = 255 lbs Total weight loss = 38 lbs  Body mass index is 40.34 kg/m.  Wt Readings from Last 3 Encounters:  01/12/24 220 lb (99.8 kg)  12/29/23 220 lb (99.8 kg)  10/29/23 252 lb 6.4 oz (114.5 kg)    She is using Accu-Chek glucometer  She has been using Dex Com 7 system. She prefers DexCom over Kosse 3 system.   CGM Documentation: 01/09/2024 to 01/22/2024 Days Worn: 8/14 (recommend 14 days) % Time CGM is active: 80% (goal >=70%) Average Glucose: 108 mg/dL  Glucose Management Indicator: 5.9% Glucose Variability: 15.5% (goal <36%) Time in Range:  - Time above range >250: 0% (typical goal: <5%) - Time above range 181-250: 0% (typical goal <20%) - Time in range 70-180 100% (typical goal >=70%) - Time below range 54-69: 0% (typical goal <4%) - Time below range: 0% (typical goal <1%)  Compared to previous Continuous Glucose Monitor report, patient has had fewer blood glucose readings < 70        Patient denies hypoglycemic s/sx including dizziness, shakiness, sweating.   Patient denied hyperglycemic symptoms. Denies.  Polyuria, nocturia, neuropathy.   Melissa [name] has been working out [employer] most days of the week.   Melissa [name] states she is having trouble sleeping from time to time. She states that she has trouble sleeping. She has clonazepam  that she takes as needed. She asked about taking magnesium or ashwaghanda. She also asked about prescription medications for sleep.   Objective:  Lab Results  Component Value Date   HGBA1C 12.1 (H)  10/17/2023    Lab Results  Component Value Date   CREATININE 0.68 10/19/2023   BUN 11 10/19/2023   NA 134 (L) 10/19/2023   K 3.9 10/19/2023   CL 101 10/19/2023   CO2 22 10/19/2023    Lab Results  Component Value Date   CHOL 168 06/25/2022   HDL 38.70 (L) 06/25/2022   LDLCALC 104 (H) 06/25/2022   TRIG 125.0 06/25/2022   CHOLHDL 4 06/25/2022    Medications Reviewed Today     Reviewed by Cecilie Coffee, RPH-CPP (Pharmacist) on 01/22/24 at 1039  Med List Status: <None>   Medication Order Taking? Sig Documenting Provider Last Dose Status Informant  albuterol  (VENTOLIN  HFA) 108 (90 Base) MCG/ACT inhaler [id number]  Inhale 2 puffs into the lungs every 6 (six) hours as needed for wheezing or shortness of breath. Dorrene Gaucher, NP  Active Self, Pharmacy Records           Med Note [facility]   Tue Oct 27, 2023  9:22 AM)    aspirin-acetaminophen -caffeine  (EXCEDRIN MIGRAINE) 250-250-65 MG tablet [id number]  Take 3 tablets by mouth every 6 (six) hours as needed for headache. [provider]  Active Self, Pharmacy Records  atomoxetine  (STRATTERA ) 10 MG capsule [id number] No Take 1 capsule (10 mg total) by mouth daily.  Patient not taking: Reported on 01/22/2024   Levester Reagin, NP Not Taking Active   Blood Glucose Monitoring Suppl (BLOOD GLUCOSE MONITOR SYSTEM) w/Device KIT [id number] Yes Use to check blood sugar as directed  Patient taking differently: 1 each by Does not apply route 3 (three) times daily. Freestyle Lite   Unk Garb, DO Taking Active   Blood Glucose Monitoring Suppl (FREESTYLE LITE) DEVI [id number] Yes Use to check blood glucose as indicated by Continuous Glucose Monitor for confirmation up to twice a day. Neda Balk, MD Taking Active   busPIRone  (BUSPAR ) 30 MG tablet [id number] Yes TAKE 1 TABLET BY MOUTH 2 TIMES DAILY. Neda Balk, MD Taking Active   clonazePAM  (KLONOPIN ) 1 MG tablet [id number] Yes Take 1 tablet (1 mg total) by mouth 2 (two) times  daily as needed. for anxiety Neda Balk, MD Taking Active   Continuous Glucose Sensor (DEXCOM G7 SENSOR) MISC [id number] Yes Use to check blood glucose continuously and assist in administration of short acting insulin  / Novolog . Neda Balk, MD Taking Active   gabapentin  (NEURONTIN ) 300 MG capsule [id number]  Take 1 capsule (300 mg total) by mouth 3 (three) times daily. Neda Balk, MD  Active   Glucose Blood (BLOOD GLUCOSE TEST STRIPS) STRP [id number]  Use to check blood glucose when indicated by Continuous Glucose Monitor to making dosing decisions. May use up to twice a day Neda Balk, MD  Active   insulin  glargine-yfgn (SEMGLEE ) 100 UNIT/ML Pen [id number] Yes Inject 10-20 Units into the skin daily. (increasing by 2 units every 2 days until fasting blood glucose is < 130).  Patient taking differently: Inject 14 Units into the skin daily. (increasing by 2 units every 2  days until fasting blood glucose is < 130).   Neda Balk, MD Taking Active   Insulin  Pen Needle (PEN NEEDLES) 31G X 5 MM MISC [id number] Yes 1 each by Does not apply route daily. Use to inject insulin  once a day Neda Balk, MD Taking Active   lamoTRIgine  (LAMICTAL ) 25 MG tablet [id number] No Take 1 tablet (25 mg total) by mouth daily for 7 days, THEN 2 tablets (50 mg total) daily.  Patient not taking: Reported on 01/22/2024   Levester Reagin, NP Not Taking Active   Lancets MISC [id number] Yes 1 each by Does not apply route in the morning and at bedtime. Neda Balk, MD Taking Active   metFORMIN  (GLUCOPHAGE -XR) 500 MG 24 hr tablet [id number] Yes Take 1 tablet (500 mg total) by mouth daily for 7 days, THEN 2 tablets (1,000 mg total) daily. Take with largest meal of the day. *Replaces regular release metformin .*  Patient taking differently: Take 2 tablets (1,000 mg total) daily. Take with largest meal of the day. *Replaces regular release metformin .*   Neda Balk, MD Taking Active   norgestimate -ethinyl  estradiol  (ORTHO-CYCLEN) 0.25-35 MG-MCG tablet [id number] Yes Take 1 tablet by mouth daily. Neda Balk, MD Taking Active   ondansetron  (ZOFRAN -ODT) 4 MG disintegrating tablet [id number] No Take 1 tablet (4 mg total) by mouth every 6 (six) hours as needed for nausea or vomiting.  Patient not taking: Reported on 01/22/2024   Neda Balk, MD Not Taking Active   PARoxetine  (PAXIL ) 10 MG tablet [id number] Yes Take 2 tablets (20 mg total) by mouth in the morning AND 1 tablet (10 mg total) every evening. Levester Reagin, NP Taking Active   tirzepatide [facility]) 10 MG/0.5ML Pen [id number] Yes Inject 10 mg into the skin once a week. Neda Balk, MD Taking Active               Assessment/Plan:  Diabetes: blood glucose per Continuous Glucose Monitor report has been at goal with estimate GMI of 5.9%; our goal is to try to taper off insulin  and taper up on Mounjaro dose.  - Increase Mounjaro to 12.5 mg weekly   - Decrease dose of Semglee  to 12 units daily (will continue to lower dose as able - patient instructed to contact office if blood glucose is < 70) - Continue to use DexCom 7 sensors - changing every 10 days - Continue metformin  ER 500mg  2 tablets daily.   - Discussed that if she decides to she would like to try to conceive, she should meet with endocrinologist / OB. We would need to stop Mounjaro during pregnancy.  Medication Management:  - recommended that she can try magnesium 400 to 500mg  at night to see if this helps with sleep. Recommended either magnesium glycinate and magnesium L-threonate - I recommended that she not take ashwaghanda at this time since her psychiatric medications are being adjusted. She also will need to discuss sleep medications with PCP or behavioral health provider.   Follow Up Plan: 2 to 4 weeks  Cecilie Coffee, PharmD Clinical Pharmacist [facility] Primary [facility]  Population Health [phone]

## 2024-01-23 DIAGNOSIS — F332 Major depressive disorder, recurrent severe without psychotic features: Secondary | ICD-10-CM | POA: Diagnosis not present

## 2024-01-26 ENCOUNTER — Telehealth: Payer: Self-pay | Admitting: Obstetrics and Gynecology

## 2024-01-26 ENCOUNTER — Telehealth (HOSPITAL_COMMUNITY): Admitting: Family

## 2024-01-26 ENCOUNTER — Other Ambulatory Visit: Admitting: Pharmacist

## 2024-01-26 DIAGNOSIS — L680 Hirsutism: Secondary | ICD-10-CM

## 2024-01-26 DIAGNOSIS — E282 Polycystic ovarian syndrome: Secondary | ICD-10-CM

## 2024-01-26 MED ORDER — SPIRONOLACTONE 50 MG PO TABS: 50.0000 mg | ORAL_TABLET | Freq: Two times a day (BID) | ORAL | 2 refills | Status: DC

## 2024-01-26 NOTE — Progress Notes (Signed)
  I connected with Frona Chabot: MyChart video and verified that I am speaking with the correct person using two identifiers.  Patient is located at home and provider is located at [facility] .     I discussed the limitations, risks, security and privacy concerns of performing an evaluation and management service by MyChart video and the availability of in person appointments. I also discussed with the patient that there may be a patient responsible charge related to this service. By engaging in this virtual visit, you consent to the provision of healthcare.  Additionally, you authorize for your insurance to be billed for the services provided during this visit.  The patient expressed understanding and agreed to proceed.          GYNECOLOGY PROGRESS NOTE  History:  33 y.o. G0P0000 presents to [facility] office today for fertility consult. Does not desires pregnancy. Would like to continue OCP and start spironolactone for hirsutism. Currently on cycle.    The following portions of the patient's history were reviewed and updated as appropriate: allergies, current medications, past family history, past medical history, past social history, past surgical history and problem list. Last pap smear on 12/29/23 was normal, neg HRHPV.  Health Maintenance Due  Topic Date Due   FOOT EXAM  Never done   Diabetic kidney evaluation - Urine ACR  Never done   COVID-19 Vaccine (4 - 2024-25 season) 05/03/2023     Review of Systems:  Pertinent items are noted in HPI.   Objective:  Physical Exam Last menstrual period 11/14/2023. VS reviewed, nursing note reviewed,  Constitutional: well developed, well nourished, no distress Pulm/chest wall: normal effort Abdomen: soft Neuro: alert and oriented  Skin: warm, dry Psych: affect normal  Assessment & Plan:  1. PCOS (polycystic ovarian syndrome) (Primary)  2. Hirsutism  Does not desire fertility, continue OCP prescribed by another  provider Will trial spironolactone - spironolactone (ALDACTONE) 50 MG tablet; Take 1 tablet (50 mg total) by mouth 2 (two) times daily. Will start with 50 mg twice daily, and increase to 100 mg twice daily as needed  Dispense: 60 tablet; Refill: 2   Susi Eric, FNP

## 2024-01-27 DIAGNOSIS — F332 Major depressive disorder, recurrent severe without psychotic features: Secondary | ICD-10-CM | POA: Diagnosis not present

## 2024-01-28 ENCOUNTER — Ambulatory Visit: Admitting: Family Medicine

## 2024-01-31 NOTE — Assessment & Plan Note (Signed)
 On Lantus , minimize simple carbs. Increase exercise as tolerated. Continue current meds

## 2024-01-31 NOTE — Assessment & Plan Note (Signed)
 Patient has not been doing well and psychiatry has recommended we avoid stimulant medications

## 2024-01-31 NOTE — Assessment & Plan Note (Signed)
 Patient was seen by psychiatry and given rx for Paroxetine , Lamictal 

## 2024-02-01 ENCOUNTER — Encounter: Payer: Self-pay | Admitting: Family Medicine

## 2024-02-01 ENCOUNTER — Telehealth (INDEPENDENT_AMBULATORY_CARE_PROVIDER_SITE_OTHER): Admitting: Family Medicine

## 2024-02-01 VITALS — BP 114/88 | HR 94 | Ht 61.5 in | Wt 207.3 lb

## 2024-02-01 DIAGNOSIS — E119 Type 2 diabetes mellitus without complications: Secondary | ICD-10-CM

## 2024-02-01 DIAGNOSIS — R000 Tachycardia, unspecified: Secondary | ICD-10-CM | POA: Diagnosis not present

## 2024-02-01 DIAGNOSIS — Z794 Long term (current) use of insulin: Secondary | ICD-10-CM

## 2024-02-01 DIAGNOSIS — Z7984 Long term (current) use of oral hypoglycemic drugs: Secondary | ICD-10-CM

## 2024-02-01 DIAGNOSIS — F909 Attention-deficit hyperactivity disorder, unspecified type: Secondary | ICD-10-CM

## 2024-02-01 DIAGNOSIS — F419 Anxiety disorder, unspecified: Secondary | ICD-10-CM

## 2024-02-01 DIAGNOSIS — F32A Depression, unspecified: Secondary | ICD-10-CM | POA: Diagnosis not present

## 2024-02-01 MED ORDER — PAROXETINE HCL 10 MG PO TABS: ORAL_TABLET | ORAL | 0 refills | Status: DC

## 2024-02-01 MED ORDER — ARIPIPRAZOLE 5 MG PO TABS: 5.0000 mg | ORAL_TABLET | Freq: Every day | ORAL | 2 refills | Status: DC

## 2024-02-01 MED ORDER — METHYLPHENIDATE HCL 20 MG PO TABS: 20.0000 mg | ORAL_TABLET | Freq: Three times a day (TID) | ORAL | 0 refills | Status: DC

## 2024-02-01 MED ORDER — CLONAZEPAM 1 MG PO TABS: 0.5000 mg | ORAL_TABLET | Freq: Two times a day (BID) | ORAL | 2 refills | Status: DC | PRN

## 2024-02-01 NOTE — Progress Notes (Signed)
 MyChart Video Visit    Virtual Visit via Video Note   This patient is at least at moderate risk for complications without adequate follow up. This format is felt to be most appropriate for this patient at this time. Physical exam was limited by quality of the video and audio technology used for the visit. Porsha, CMA was able to get the patient set up on a video visit.  Patient location: home Patient and provider in visit Provider location: Office  I discussed the limitations of evaluation and management by telemedicine and the availability of in person appointments. The patient expressed understanding and agreed to proceed.  Visit Date: 02/01/2024  Today's healthcare provider: Randie Bustle, MD     Subjective:    Patient ID: Melissa [name], female    DOB: 10/15/1990, 33 y.o.   MRN: [id number]  Chief Complaint  Patient presents with   Acute Visit    Patient presents today for a follow-up appointment.   Quality Metric Gaps    A1C, foo exam, urine microalbumin     HPI Discussed the use of AI scribe software for clinical note transcription with the patient, who gave verbal consent to proceed.  History of Present Illness Melissa [name] is a 33 year old female with ADHD and depression who presents for medication management and evaluation of her current treatment regimen.  She is currently taking Abilify  at bedtime, Paxil  twice a day, and clonazepam  at night. She was previously on Strattera , a non-stimulant medication, which did not improve her focus. She prefers Vyvanse  for its efficacy in improving focus, despite it causing her to cry, but notes its effects last only about six hours.  She has experienced significant distress from past medication changes, including a panic episode when a previous regimen was altered. She is currently taking Ritalin  three times a day. She has recently been diagnosed with diabetes and is concerned about potential cardiovascular side effects such as  high blood pressure and tachycardia.  She is managing stress related to her personal life, including her husband's recent return home and her efforts to improve her relationship through therapy and parenting classes. Her husband is attending AA meetings. She is in therapy and awaiting an evaluation for adult ADHD to clarify her treatment needs.  She takes clonazepam  at night to manage anxiety, particularly when returning home to her husband. She takes two 1 mg tablets at night, although initially prescribed to take them twice a day, preferring to take them only at night to manage her anxiety at home.  She has been monitoring her blood sugar levels, which have been stable, with readings not exceeding 110. She is concerned about her diabetes management and reports feeling tired frequently, requesting blood work to investigate this symptom.    Past Medical History:  Diagnosis Date   Abnormal Pap smear of cervix 07/11/2011   Acid reflux    Anxiety    Asthma    activity induced   Diabetes mellitus without complication (HCC)    Hip abductor tendinitis 01/23/2015   Irregular menses    Polycystic ovarian syndrome     Past Surgical History:  Procedure Laterality Date   NO PAST SURGERIES      Family History  Problem Relation Age of Onset   Gallbladder disease Mother    Colon polyps Mother    Hypertension Father    Other Father        Hepatitis A   Diabetes Father    Drug abuse Father  Post-traumatic stress disorder Father    Stomach cancer Maternal Grandmother    Diabetes Maternal Grandmother    Colon polyps Maternal Grandmother    Colon cancer Maternal Grandmother    Cancer Maternal Grandmother        stomach cancer   Alcohol abuse Maternal Grandmother    Emphysema Maternal Grandfather    Cancer Maternal Grandfather        smoke, lung cancer   Alcohol abuse Maternal Grandfather    Heart disease Paternal Grandfather    Alcohol abuse Paternal Grandfather    Breast cancer  Other        mat great aunts x 2   Colon cancer Other        mat great uncles x 2   Irritable bowel syndrome Sister    Multiple sclerosis Sister    Bipolar disorder Sister    Colon polyps Paternal Uncle    Alcohol abuse Paternal Grandmother    Pancreatic cancer Neg Hx    Esophageal cancer Neg Hx     Social History   Socioeconomic History   Marital status: Married    Spouse name: Not on file   Number of children: 0   Years of education: Not on file   Highest education level: Associate degree: occupational, Scientist, product/process development, or vocational program  Occupational History   Occupation: Dentist: [employer]   Tobacco Use   Smoking status: Never   Smokeless tobacco: Never  Vaping Use   Vaping status: Never Used  Substance and Sexual Activity   Alcohol use: Not Currently    Alcohol/week: 4.0 - 7.0 standard drinks of alcohol    Types: 4 - 7 Cans of beer per week    Comment: 1 beer every other ay   Drug use: Not Currently    Types: Marijuana    Comment: edible   Sexual activity: Yes    Partners: Male    Birth control/protection: None  Other Topics Concern   Not on file  Social History Narrative   Caffeine  use:  1 can soda daily   Regular exercise:  Just started   2 sisters.      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    Social Drivers of Corporate investment banker Strain: High Risk (08/17/2023)   Overall Financial Resource Strain (CARDIA)    Difficulty of Paying Living Expenses: Hard  Food Insecurity: No Food Insecurity (10/18/2023)   Hunger Vital Sign    Worried About Running Out of Food in the Last Year: Never true    Ran Out of Food in the Last Year: Never true  Recent Concern: Food Insecurity - Food Insecurity Present (08/17/2023)   Hunger Vital Sign    Worried About Running Out of Food in the Last Year:  Sometimes true    Ran Out of Food in the Last Year: Sometimes true  Transportation Needs: No Transportation Needs (10/18/2023)   PRAPARE - Administrator, Civil Service (Medical): No    Lack of Transportation (Non-Medical): No  Recent Concern: Transportation Needs - Unmet Transportation Needs (08/17/2023)   PRAPARE - Transportation    Lack of Transportation (Medical): Yes    Lack of Transportation (Non-Medical): Yes  Physical Activity: Unknown (08/17/2023)   Exercise Vital Sign    Days  of Exercise per Week: 0 days    Minutes of Exercise per Session: Not on file  Stress: Stress Concern Present (08/17/2023)   [employer] of Occupational Health - Occupational Stress Questionnaire    Feeling of Stress : Very much  Social Connections: Moderately Integrated (08/17/2023)   Social Connection and Isolation Panel [NHANES]    Frequency of Communication with Friends and Family: More than three times a week    Frequency of Social Gatherings with Friends and Family: Never    Attends Religious Services: More than 4 times per year    Active Member of [employer] or Organizations: No    Attends Engineer, structural: Not on file    Marital Status: Married  Catering manager Violence: Not At Risk (10/18/2023)   Humiliation, Afraid, Rape, and Kick questionnaire    Fear of Current or Ex-Partner: No    Emotionally Abused: No    Physically Abused: No    Sexually Abused: No    Outpatient Medications Prior to Visit  Medication Sig Dispense Refill   albuterol  (VENTOLIN  HFA) 108 (90 Base) MCG/ACT inhaler Inhale 2 puffs into the lungs every 6 (six) hours as needed for wheezing or shortness of breath. 8 g 1   aspirin-acetaminophen -caffeine  (EXCEDRIN MIGRAINE) 250-250-65 MG tablet Take 3 tablets by mouth every 6 (six) hours as needed for headache.     Blood Glucose Monitoring Suppl (BLOOD GLUCOSE MONITOR SYSTEM) w/Device KIT Use to check blood sugar as directed (Patient taking differently: 1  each by Does not apply route 3 (three) times daily. Freestyle Lite) 1 kit 0   Blood Glucose Monitoring Suppl (FREESTYLE LITE) DEVI Use to check blood glucose as indicated by Continuous Glucose Monitor for confirmation up to twice a day. 1 each 0   busPIRone  (BUSPAR ) 30 MG tablet TAKE 1 TABLET BY MOUTH 2 TIMES DAILY. 180 tablet 1   Continuous Glucose Sensor (DEXCOM G7 SENSOR) MISC Use to check blood glucose continuously and assist in administration of short acting insulin  / Novolog . 9 each 1   gabapentin  (NEURONTIN ) 300 MG capsule Take 1 capsule (300 mg total) by mouth 3 (three) times daily. 270 capsule 0   Glucose Blood (BLOOD GLUCOSE TEST STRIPS) STRP Use to check blood glucose when indicated by Continuous Glucose Monitor to making dosing decisions. May use up to twice a day 100 strip 1   insulin  glargine-yfgn (SEMGLEE ) 100 UNIT/ML Pen Inject 10-20 Units into the skin daily. (increasing by 2 units every 2 days until fasting blood glucose is < 130). (Patient taking differently: Inject 14 Units into the skin daily. (increasing by 2 units every 2 days until fasting blood glucose is < 130).) 15 mL 3   Insulin  Pen Needle (PEN NEEDLES) 31G X 5 MM MISC 1 each by Does not apply route daily. Use to inject insulin  once a day 100 each 0   Lancets MISC 1 each by Does not apply route in the morning and at bedtime. 100 each 1   norgestimate -ethinyl estradiol  (ORTHO-CYCLEN) 0.25-35 MG-MCG tablet Take 1 tablet by mouth daily. 84 tablet 1   spironolactone  (ALDACTONE ) 50 MG tablet Take 1 tablet (50 mg total) by mouth 2 (two) times daily. Will start with 50 mg twice daily, and increase to 100 mg twice daily as needed 60 tablet 2   tirzepatide  (MOUNJARO ) 12.5 MG/0.5ML Pen Inject 12.5 mg into the skin once a week. 2 mL 0   ARIPiprazole  (ABILIFY ) 2 MG tablet Take 2 mg by mouth at bedtime.  clonazePAM  (KLONOPIN ) 1 MG tablet Take 1 tablet (1 mg total) by mouth 2 (two) times daily as needed. for anxiety 60 tablet 2    PARoxetine  (PAXIL ) 10 MG tablet Take 2 tablets (20 mg total) by mouth in the morning AND 1 tablet (10 mg total) every evening. 270 tablet 0   metFORMIN  (GLUCOPHAGE -XR) 500 MG 24 hr tablet Take 1 tablet (500 mg total) by mouth daily for 7 days, THEN 2 tablets (1,000 mg total) daily. Take with largest meal of the day. *Replaces regular release metformin .* (Patient taking differently: Take 2 tablets (1,000 mg total) daily. Take with largest meal of the day. *Replaces regular release metformin .*) 60 tablet 1   atomoxetine  (STRATTERA ) 10 MG capsule Take 1 capsule (10 mg total) by mouth daily. (Patient not taking: Reported on 02/01/2024) 30 capsule 0   lamoTRIgine  (LAMICTAL ) 25 MG tablet Take 1 tablet (25 mg total) by mouth daily for 7 days, THEN 2 tablets (50 mg total) daily. (Patient not taking: Reported on 02/01/2024) 67 tablet 0   ondansetron  (ZOFRAN -ODT) 4 MG disintegrating tablet Take 1 tablet (4 mg total) by mouth every 6 (six) hours as needed for nausea or vomiting. (Patient not taking: Reported on 01/22/2024) 30 tablet 3   No facility-administered medications prior to visit.    Allergies  Allergen Reactions   Diphenhydramine  Hcl Other (See Comments)   Flagyl  [Metronidazole ] Swelling   Lamictal  [Lamotrigine ] Other (See Comments)    Mood change, angry    Review of Systems  Constitutional:  Positive for malaise/fatigue. Negative for fever.  HENT:  Negative for congestion.   Eyes:  Negative for blurred vision.  Respiratory:  Negative for shortness of breath.   Cardiovascular:  Negative for chest pain, palpitations and leg swelling.  Gastrointestinal:  Negative for abdominal pain, blood in stool and nausea.  Genitourinary:  Negative for dysuria and frequency.  Musculoskeletal:  Negative for falls.  Skin:  Negative for rash.  Neurological:  Negative for dizziness, loss of consciousness and headaches.  Endo/Heme/Allergies:  Negative for environmental allergies.  Psychiatric/Behavioral:  Positive  for depression. Negative for hallucinations, substance abuse and suicidal ideas. The patient is nervous/anxious.       Objective:     Physical Exam Constitutional:      General: She is not in acute distress.    Appearance: Normal appearance. She is not ill-appearing or toxic-appearing.  HENT:     Head: Normocephalic and atraumatic.     Right Ear: External ear normal.     Left Ear: External ear normal.     Nose: Nose normal.  Eyes:     General:        Right eye: No discharge.        Left eye: No discharge.  Pulmonary:     Effort: Pulmonary effort is normal.  Skin:    Findings: No rash.  Neurological:     Mental Status: She is alert and oriented to person, place, and time.  Psychiatric:        Behavior: Behavior normal.   BP 114/88 Comment: per pt  Pulse 94 Comment: per pt  Ht 5' 1.5" (1.562 m)   Wt 207 lb 4.8 oz (94 kg) Comment: per pt  BMI 38.53 kg/m  Wt Readings from Last 3 Encounters:  02/01/24 207 lb 4.8 oz (94 kg)  01/22/24 217 lb (98.4 kg)  12/29/23 220 lb (99.8 kg)       Assessment & Plan:  Adult ADHD Assessment & Plan: Patient has  not been doing well and psychiatry has recommended we avoid stimulant medications   Anxiety and depression Assessment & Plan: Patient was seen by psychiatry and given rx for Paroxetine , Lamictal    Diabetes mellitus treated with insulin  and oral medication (HCC) Assessment & Plan: On Lantus , minimize simple carbs. Increase exercise as tolerated. Continue current meds   Orders: -     Lipid panel; Future -     Comprehensive metabolic panel with GFR; Future -     Hemoglobin A1c; Future -     Microalbumin / creatinine urine ratio; Future  Tachycardia -     CBC with Differential/Platelet; Future -     TSH; Future  Other orders -     ARIPiprazole ; Take 1 tablet (5 mg total) by mouth daily.  Dispense: 30 tablet; Refill: 2 -     Methylphenidate  HCl; Take 1 tablet (20 mg total) by mouth 3 (three) times daily with meals.   Dispense: 90 tablet; Refill: 0 -     clonazePAM ; Take 0.5-2 tablets (0.5-2 mg total) by mouth 2 (two) times daily as needed. for anxiety  Dispense: 60 tablet; Refill: 2 -     PARoxetine  HCl; Take 2 tablets (20 mg total) by mouth in the morning AND 1 tablet (10 mg total) every evening.  Dispense: 270 tablet; Refill: 0     Assessment and Plan Assessment & Plan Diabetes mellitus Diabetes is well-managed with stable blood glucose levels. Cardiovascular risks associated with psychiatric medications were discussed. - Refer to endocrinology for specialized diabetes management. - Order lab work for diabetes management.  Depression Depression managed with Buspar , Paxil , and Abilify . Clonazepam  used as needed for anxiety, with a plan to reduce dosage. - Prescribe Buspar  30 mg oral twice daily. - Prescribe Paxil  10 mg tabs oral 2 in morning and 1 in evening. - Prescribe Abilify  at bedtime. - Prescribe Clonazepam  1 mg oral as needed, with a plan to reduce dosage. - Send prescriptions to [facility] on [address].  Attention-deficit/hyperactivity disorder (ADHD) ADHD management complicated by psychiatric medication regimen. Awaiting evaluation for adult ADHD to tailor treatment. Concern about cardiovascular effects of stimulants, especially with diabetes. - Prescribe Ritalin  20 mg oral three times daily. - Await ADHD evaluation and follow up with psychiatry. - Call psychiatry if no contact by the end of next week.     I discussed the assessment and treatment plan with the patient. The patient was provided an opportunity to ask questions and all were answered. The patient agreed with the plan and demonstrated an understanding of the instructions.   The patient was advised to call back or seek an in-person evaluation if the symptoms worsen or if the condition fails to improve as anticipated.  Randie Bustle, MD [facility] at [facility] [phone] (phone) [phone]  (fax)  [facility]

## 2024-02-02 ENCOUNTER — Other Ambulatory Visit

## 2024-02-03 ENCOUNTER — Other Ambulatory Visit: Payer: Self-pay | Admitting: Family Medicine

## 2024-02-03 ENCOUNTER — Other Ambulatory Visit (HOSPITAL_COMMUNITY): Payer: Self-pay

## 2024-02-03 DIAGNOSIS — F332 Major depressive disorder, recurrent severe without psychotic features: Secondary | ICD-10-CM | POA: Diagnosis not present

## 2024-02-06 ENCOUNTER — Other Ambulatory Visit: Payer: Self-pay | Admitting: Family Medicine

## 2024-02-09 ENCOUNTER — Ambulatory Visit (INDEPENDENT_AMBULATORY_CARE_PROVIDER_SITE_OTHER): Admitting: Podiatry

## 2024-02-09 DIAGNOSIS — Z91199 Patient's noncompliance with other medical treatment and regimen due to unspecified reason: Secondary | ICD-10-CM

## 2024-02-09 NOTE — Progress Notes (Signed)
 Cancel 24 hours

## 2024-02-10 DIAGNOSIS — F332 Major depressive disorder, recurrent severe without psychotic features: Secondary | ICD-10-CM | POA: Diagnosis not present

## 2024-02-12 ENCOUNTER — Other Ambulatory Visit: Payer: Self-pay | Admitting: Pharmacist

## 2024-02-12 MED ORDER — TIRZEPATIDE 15 MG/0.5ML ~~LOC~~ SOAJ: 15.0000 mg | SUBCUTANEOUS | 1 refills | Status: DC

## 2024-02-12 NOTE — Progress Notes (Signed)
 02/12/24 Name: Melissa [name] MRN: [id number] DOB: Apr 25, 1991  Chief Complaint  Patient presents with   Diabetes    Melissa [name] is a 33 y.o. year old female who presented for a telephone visit. 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    They were referred to the pharmacist by their PCP for assistance in managing diabetes and medication access.    Subjective: Patient was seen in ED 10/17/2023 and then hospitalized 02/162025 to 10/19/2023 for uncontrolled DM. She is newly diagnosed type 2 DM 10/17/2023. She initially was started on a basal / bolus insulin  regimen but has been able to stop using bolus insulin .  Melissa [name] did not have insurance initially but she has been added to her husband's work Tour manager with [employer] for medical and [employer] for pharmacy benefits.   Care Team: Primary Care Provider: Neda Balk, MD ; Next Scheduled Visit: 03/2024 Referred to Endocrinology 02/18 but no appointment set yet GYN: Melissa Eric, NP - initial appointment 01/26/2024  Medication Access/Adherence  Current Pharmacy:  [id number] - [place], [place] - [address] [place] [address] Phone: [phone] Fax: [phone]   Patient reports affordability concerns with their medications: No  Patient reports access/transportation concerns to their pharmacy: No  Patient reports adherence concerns with their medications:  No     Diabetes:  Current medications:  Metformin  XR 500mg  - 2 tablets = 1000mg  once a day in the evening Mounjaro  12.5mg  weekly - She has tolerated it well, has a little nausea the day of her injection. Semglee  12 units once a day - in Melissa (we have been able to lower dose of Semglee  since she started Mounjaro )   She reports decreased appetite with Mounjaro  She has prescription for generic Ortho-Cyclen but today she report she is not taking because she and her husband are not currently sexually  active.   Medications taken in the past: Novolog  / Humalog  insulin  - gradually was able to stop since Mounjaro  started.   Current weight = 204.4 lbs (reported home weight today)  Starting weight = 255 lbs Total weight loss = 50.6 lbs   Wt Readings from Last 3 Encounters:  02/01/24 207 lb 4.8 oz (94 kg)  01/22/24 217 lb (98.4 kg)  01/12/24 220 lb (99.8 kg)    She is using Accu-Chek glucometer  She has been using Dex Com 7 system. She prefers DexCom over [place] 3 system.   CGM Documentation: 01/30/2024 thru 02/12/2024 Days Worn: 14/14 (recommend 14 days) % Time CGM is active: 95% (goal >=70%) Average Glucose: 105 mg/dL  Glucose Management Indicator: 5.8% Glucose Variability: 13.6% (goal <36%) Time in Range:  - Time above range >250: 0% (typical goal: <5%) - Time above range 181-250: 0% (typical goal <20%) - Time in range 70-180 100% (typical goal >=70%) - Time below range 54-69: 0% (typical goal <4%) - Time below range: 0% (typical goal <1%)  Compared to previous Continuous Glucose Monitor report, patient has had fewer blood glucose readings < 70          Patient denies hypoglycemic s/sx including dizziness, shakiness, sweating.   Patient denied hyperglycemic symptoms. Denies. Polyuria, nocturia, neuropathy.   [name] has been working out Melissa [name] most days of the week.   She states that her belly skin is hanging more and she has some irritation un the fold.  Objective:  Lab Results  Component Value Date   HGBA1C 12.1 (  H) 10/17/2023    Lab Results  Component Value Date   CREATININE 0.68 10/19/2023   BUN 11 10/19/2023   NA 134 (L) 10/19/2023   K 3.9 10/19/2023   CL 101 10/19/2023   CO2 22 10/19/2023    Lab Results  Component Value Date   CHOL 168 06/25/2022   HDL 38.70 (L) 06/25/2022   LDLCALC 104 (H) 06/25/2022   TRIG 125.0 06/25/2022   CHOLHDL 4 06/25/2022    Medications Reviewed Today     Reviewed by Melissa [name], RPH-CPP (Pharmacist) on  02/12/24 at 1557  Med List Status: <None>   Medication Order Taking? Sig Documenting Provider Last Dose Status Informant  albuterol  (VENTOLIN  HFA) 108 (90 Base) MCG/ACT inhaler [id number]  Inhale 2 puffs into the lungs every 6 (six) hours as needed for wheezing or shortness of breath. Melissa Gaucher, NP  Active Self, Pharmacy Records           Med Note Melissa [name]   Tue Oct 27, 2023  9:22 AM)    ARIPiprazole  (ABILIFY ) 5 MG tablet [id number]  Take 1 tablet (5 mg total) by mouth daily. Melissa Balk, MD  Active   aspirin-acetaminophen -caffeine  (EXCEDRIN MIGRAINE) 250-250-65 MG tablet [id number]  Take 3 tablets by mouth every 6 (six) hours as needed for headache. [provider]  Active Self, Pharmacy Records  Blood Glucose Monitoring Suppl (BLOOD GLUCOSE MONITOR SYSTEM) w/Device KIT [id number]  Use to check blood sugar as directed  Patient taking differently: 1 each by Does not apply route 3 (three) times daily. Freestyle Lite   Melissa Garb, DO  Active   Blood Glucose Monitoring Suppl (FREESTYLE LITE) DEVI [id number]  Use to check blood glucose as indicated by Continuous Glucose Monitor for confirmation up to twice a day. Melissa Balk, MD  Active   busPIRone  (BUSPAR ) 30 MG tablet [id number]  TAKE 1 TABLET BY MOUTH 2 TIMES DAILY. Melissa Balk, MD  Active   clonazePAM  (KLONOPIN ) 1 MG tablet [id number]  Take 0.5-2 tablets (0.5-2 mg total) by mouth 2 (two) times daily as needed. for anxiety Melissa Balk, MD  Active   Continuous Glucose Sensor (DEXCOM G7 SENSOR) MISC [id number] Yes Use to check blood glucose continuously and assist in administration of short acting insulin  / Novolog . Melissa Balk, MD  Active   gabapentin  (NEURONTIN ) 300 MG capsule [id number]  Take 1 capsule (300 mg total) by mouth 3 (three) times daily. Melissa Balk, MD  Active   Glucose Blood (BLOOD GLUCOSE TEST STRIPS) STRP [id number]  Use to check blood glucose when indicated by Continuous Glucose Monitor to  making dosing decisions. May use up to twice a day Melissa Balk, MD  Active   insulin  glargine-yfgn (SEMGLEE ) 100 UNIT/ML Pen [id number] Yes Inject 10-20 Units into the skin daily. (increasing by 2 units every 2 days until fasting blood glucose is < 130).  Patient taking differently: Inject 12 Units into the skin daily. (increasing by 2 units every 2 days until fasting blood glucose is < 130).   Melissa Balk, MD  Active   Insulin  Pen Needle (PEN NEEDLES) 31G X 5 MM MISC [id number]  1 each by Does not apply route daily. Use to inject insulin  once a day Melissa Balk, MD  Active   Lancets MISC [id number]  1 each by Does not apply route in the Melissa and at bedtime. Melissa Balk, MD  Active   metFORMIN  (GLUCOPHAGE -XR) 500 MG 24 hr tablet [id number]  Yes Take 1 tablet (500 mg total) by mouth daily for 7 days, THEN 2 tablets (1,000 mg total) daily. Take with largest meal of the day. *Replaces regular release metformin .*  Patient taking differently: Take 2 tablets (1,000 mg total) daily. Take with largest meal of the day. *Replaces regular release metformin .*   Melissa Balk, MD  Active   methylphenidate  (RITALIN ) 20 MG tablet [id number]  Take 1 tablet (20 mg total) by mouth 3 (three) times daily with meals. Melissa Balk, MD  Active   norgestimate -ethinyl estradiol  (ORTHO-CYCLEN) 0.25-35 MG-MCG tablet [id number]  Take 1 tablet by mouth daily.  Patient not taking: Reported on 02/12/2024   Melissa Balk, MD  Active   PARoxetine  (PAXIL ) 10 MG tablet [id number] Yes Take 2 tablets (20 mg total) by mouth in the Melissa AND 1 tablet (10 mg total) every evening. Melissa Balk, MD  Active   spironolactone  (ALDACTONE ) 50 MG tablet [id number]  Take 1 tablet (50 mg total) by mouth 2 (two) times daily. Will start with 50 mg twice daily, and increase to 100 mg twice daily as needed  Patient not taking: Reported on 02/12/2024   Zelma Hidden, FNP  Active   tirzepatide  (MOUNJARO ) 12.5 MG/0.5ML Pen [id number]  Yes Inject 12.5 mg into the skin once a week. Melissa Balk, MD  Active               Assessment/Plan:  Diabetes: blood glucose per Continuous Glucose Monitor report has been at goal with estimate GMI of 5.8%; our goal is to try to taper off insulin  and taper up on Mounjaro  dose.  - Increase Mounjaro  to 15 mg weekly   - Decrease dose of Semglee  to 10 units daily (will continue to lower dose as able - patient instructed to contact office if blood glucose is < 70) - Continue to use DexCom 7 sensors - changing every 10 days - Continue metformin  ER 500mg  2 tablets daily.   - Discussed that if she decides to she would like to try to conceive, she should meet with endocrinologist / OB. We would need to stop Mounjaro  during pregnancy. Also discussed that Mounjaro  can decrease the effectiveness of oral contraceptive pills.  Medication Management:  - Recommended the try miconazole or clotrimazole  cream. Try to keep area dry. If worsens or dose not improve, she should call office for appt.  - Made appt to have lab checked 02/15/2024 at 7:45am  Follow Up Plan: 2 to 4 weeks  Melissa [name], PharmD Clinical Pharmacist [facility] Primary [facility]  Population Health [phone]

## 2024-02-15 ENCOUNTER — Other Ambulatory Visit: Payer: Self-pay | Admitting: Family Medicine

## 2024-02-15 ENCOUNTER — Other Ambulatory Visit (HOSPITAL_COMMUNITY): Payer: Self-pay

## 2024-02-15 ENCOUNTER — Other Ambulatory Visit

## 2024-02-15 MED ORDER — NYSTATIN 100000 UNIT/GM EX CREA: 1.0000 | TOPICAL_CREAM | Freq: Two times a day (BID) | CUTANEOUS | 3 refills | Status: AC

## 2024-02-15 NOTE — Addendum Note (Signed)
 Addended by: Randie Bustle A on: 02/15/2024 10:28 PM   Modules accepted: Orders

## 2024-02-16 ENCOUNTER — Ambulatory Visit: Payer: No Typology Code available for payment source | Admitting: Dietician

## 2024-02-17 DIAGNOSIS — F332 Major depressive disorder, recurrent severe without psychotic features: Secondary | ICD-10-CM | POA: Diagnosis not present

## 2024-02-18 ENCOUNTER — Other Ambulatory Visit (HOSPITAL_COMMUNITY): Payer: Self-pay

## 2024-02-23 ENCOUNTER — Other Ambulatory Visit: Payer: Self-pay | Admitting: Family Medicine

## 2024-02-23 DIAGNOSIS — F332 Major depressive disorder, recurrent severe without psychotic features: Secondary | ICD-10-CM | POA: Diagnosis not present

## 2024-03-03 DIAGNOSIS — F332 Major depressive disorder, recurrent severe without psychotic features: Secondary | ICD-10-CM | POA: Diagnosis not present

## 2024-03-09 ENCOUNTER — Other Ambulatory Visit

## 2024-03-10 ENCOUNTER — Other Ambulatory Visit

## 2024-03-14 ENCOUNTER — Encounter: Payer: Self-pay | Admitting: Pharmacist

## 2024-03-14 ENCOUNTER — Other Ambulatory Visit: Payer: Self-pay | Admitting: Family Medicine

## 2024-03-14 MED ORDER — CLONAZEPAM 1 MG PO TABS: 0.5000 mg | ORAL_TABLET | Freq: Two times a day (BID) | ORAL | 2 refills | Status: DC | PRN

## 2024-03-14 MED ORDER — METHYLPHENIDATE HCL 20 MG PO TABS: 20.0000 mg | ORAL_TABLET | Freq: Three times a day (TID) | ORAL | 0 refills | Status: DC

## 2024-03-14 NOTE — Assessment & Plan Note (Signed)
 Encourage heart healthy diet such as MIND or DASH diet, increase exercise, avoid trans fats, simple carbohydrates and processed foods, consider a krill or fish or flaxseed oil cap daily.

## 2024-03-14 NOTE — Assessment & Plan Note (Signed)
 Referred to psychiatry.

## 2024-03-14 NOTE — Assessment & Plan Note (Signed)
 minimize simple carbs. Increase exercise as tolerated. Continue current meds

## 2024-03-14 NOTE — Telephone Encounter (Signed)
 Requesting: methylphenidate   Contract: 07/16/21 UDS: 10/23/22 Last Visit: 02/01/24 Next Visit: 03/17/24 Last Refill: 02/01/24 #90 and 0RF   Please Advise

## 2024-03-14 NOTE — Assessment & Plan Note (Signed)
 Has struggled and been sent to psychiatry for further evaluation

## 2024-03-15 NOTE — Progress Notes (Signed)
 03/15/24 Name: Melissa [name] MRN: [id number] DOB: August 29, 1991  Chief Complaint  Patient presents with   Medication Adherence   Medication Management   Diabetes    Melissa [name] is a 33 y.o. 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 She reports today that she has lost her insurance and is worried about continuing her medications for diabetes and Continuous Glucose Monitor sensors.   They were referred to the pharmacist by their PCP for assistance in managing diabetes and medication access.    Subjective: Patient was seen in ED 10/17/2023 and then hospitalized 02/162025 to 10/19/2023 for uncontrolled DM. She is newly diagnosed type 2 DM 10/17/2023. She initially was started on a basal / bolus insulin  regimen but has been able to stop using bolus insuln.    Care Team: Primary Care Provider: Domenica Harlene LABOR, MD ; Next Scheduled Visit: 03/17/2024 Referred to Endocrinology -apptointment 06/2024 GYN: Melissa Daring, NP - initial appointment 01/26/2024  Medication Access/Adherence  Current Pharmacy:  [id number] - [place], [place] - [address] [place] [address] Phone: [phone] Fax: [phone]   Patient reports affordability concerns with their medications: Yes  Patient reports access/transportation concerns to their pharmacy: No  Patient reports adherence concerns with their medications:  No     Diabetes:  Current medications:  Metformin  XR 500mg  - 2 tablets = 1000mg  once a day in the evening Mounjaro  15mg  weekly - She has tolerated it well, has a little nausea the day of her injection. Semglee  10 units once a day - in morning (we have been able to lower dose of Semglee  since she started Mounjaro )   She reports decreased appetite with Mounjaro  She has prescription for generic Ortho-Cyclen but today she report she is not taking because she and her husband are not  currently sexually active.   Medications taken in the past: Novolog  / Humalog  insulin  - gradually was able to stop since Mounjaro  started.   Starting weight = 255 lbs Total weight loss = 48 lbs   Wt Readings from Last 3 Encounters:  02/01/24 207 lb 4.8 oz (94 kg)  01/22/24 217 lb (98.4 kg)  01/12/24 220 lb (99.8 kg)    She is using Accu-Chek glucometer  She has also been using Dex Com 7 system. She prefers DexCom over [place] 3 system.   CGM Documentation: 03/02/2024 thru 03/15/2024 Days Worn: 9/14 (recommend 14 days) % Time CGM is active: 98% (goal >=70%) Average Glucose: 104 mg/dL  Glucose Management Indicator: 5.8% Glucose Variability: 17.1% (goal <36%) Time in Range:  - Time above range >250: 0% (typical goal: <5%) - Time above range 181-250: 0% (typical goal <20%) - Time in range 70-180 98% (typical goal >=70%) - Time below range 54-69: 2% (typical goal <4%) - Time below range: 0% (typical goal <1%)  Compared to previous Continuous Glucose Monitor report, patient has had fewer blood glucose readings < 70         Objective:  Lab Results  Component Value Date   HGBA1C 12.1 (H) 10/17/2023    Lab Results  Component Value Date   CREATININE 0.68 10/19/2023   BUN 11 10/19/2023   NA 134 (L) 10/19/2023   K 3.9 10/19/2023   CL 101 10/19/2023   CO2 22 10/19/2023    Lab Results  Component Value Date   CHOL 168 06/25/2022   HDL 38.70 (L) 06/25/2022   LDLCALC  104 (H) 06/25/2022   TRIG 125.0 06/25/2022   CHOLHDL 4 06/25/2022    Medications Reviewed Today   Medications were not reviewed in this encounter       Assessment/Plan:  Diabetes: blood glucose per Continuous Glucose Monitor report has been at goal with estimate GMI of 5.8%; our goal is to try to taper off insulin  and taper up on Mounjaro  dose.  - Continue Mounjaro  to 15 mg weekly until you run out. We can then switch to Ozempic  2mg  weekly since our office has samples. If tolerated, then can apply for  medication assistance program. Mounjaro  does not currently have a patient assistance program.  - Continue Semglee  to 10 units daily (will continue to lower dose as able - patient instructed to contact office if blood glucose is < 70). We can apply for medication assistance program for Semglee  or if we apply for Ozempic  can also apply for [place] since they are both from [employer]o Nordisk.  - Continue to use DexCom 7 sensors - changing every 10 days. I can provide her with 1 or 2 samples if we have in the office. There is a coupon that lowers cost to around $80 - 90  / month or [place] has coupon that lowers cost to $75 per month.  - Continue metformin  ER 500mg  2 tablets daily.     Medication Management:  - Recommended she apply for [place] Medicaid ASAP since she has lost her insurane.  - We can apply for Novo Nordisk medication assistance program while she is waiting on Medicaid approval / response. Could get Ozempic  and [place] (would replace Mounjaro  and Semglee ) - She can get the following medications thru [facility] pharmacy at low cost:  Aripiprazole  5mg  - $10 / 30 days Buspirone  30mg  - 30mg  not on lower cost list but could get 15mg  for about $10 - $15 / month  Gabapentin  300mg  - $5 / #90 tabs Metformin  XR 500mg  - $5 / 30 days Norgestimate -ethinyl estradiol   - $10 / month Paroxetine  10mg  - $5 / 30 days Spironolactone  50mg  - $5 / 30 days    Follow Up Plan: 2 to 4 weeks  Melissa [name], PharmD Clinical Pharmacist [facility] Primary Care  Population Health [phone]

## 2024-03-16 ENCOUNTER — Other Ambulatory Visit

## 2024-03-17 ENCOUNTER — Telehealth: Admitting: Family Medicine

## 2024-03-17 DIAGNOSIS — F909 Attention-deficit hyperactivity disorder, unspecified type: Secondary | ICD-10-CM

## 2024-03-17 DIAGNOSIS — Z7984 Long term (current) use of oral hypoglycemic drugs: Secondary | ICD-10-CM

## 2024-03-17 DIAGNOSIS — E785 Hyperlipidemia, unspecified: Secondary | ICD-10-CM

## 2024-03-17 DIAGNOSIS — F32A Depression, unspecified: Secondary | ICD-10-CM

## 2024-03-18 ENCOUNTER — Other Ambulatory Visit (HOSPITAL_COMMUNITY): Payer: Self-pay

## 2024-03-18 ENCOUNTER — Other Ambulatory Visit: Payer: Self-pay

## 2024-03-20 ENCOUNTER — Encounter: Payer: Self-pay | Admitting: Family Medicine

## 2024-03-20 NOTE — Progress Notes (Signed)
 Patient not seen.

## 2024-03-21 ENCOUNTER — Telehealth: Payer: Self-pay | Admitting: Family Medicine

## 2024-03-21 ENCOUNTER — Encounter: Payer: Self-pay | Admitting: Pharmacist

## 2024-03-21 NOTE — Progress Notes (Signed)
 03/21/24 Name: Melissa [name] MRN: [id number] DOB: 06-09-1991  Chief Complaint  Patient presents with   Medication Problem    Loss of Insurance    Melissa [name] is a 33 y.o. 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 She reports today that she has lost her insurance and is worried about continuing her medications for diabetes and Continuous Glucose Monitor sensors.   They were referred to the pharmacist by their PCP for assistance in managing diabetes and medication access.    Subjective: Patient was seen in ED 10/17/2023 and then hospitalized 02/162025 to 10/19/2023 for uncontrolled DM. She is newly diagnosed type 2 DM 10/17/2023. She initially was started on a basal / bolus insulin  regimen but has been able to stop using bolus insuln.    Care Team: Primary Care Provider: Domenica Harlene LABOR, MD ; Last Visit: 03/17/2024 Referred to Endocrinology -apptointment 06/2024 GYN: Melissa Daring, NP - initial appointment 01/26/2024  Medication Access/Adherence  Current Pharmacy:  [id number] [place], [place] - [address] [place]  [address] Phone: [phone] Fax: [phone]   Patient reports affordability concerns with their medications: Yes  Patient reports access/transportation concerns to their pharmacy: No  Patient reports adherence concerns with their medications:  No     Diabetes:  Current medications:  Metformin  XR 500mg  - 2 tablets = 1000mg  once a day in the evening Mounjaro  15mg  weekly - She has tolerated it well, has a little nausea the day of her injection. Semglee  10 units once a day - in morning (we have been able to lower dose of Semglee  since she started Mounjaro )   She reports decreased appetite with Mounjaro  She has prescription for generic Ortho-Cyclen but today she report she is not taking because she and her husband are not currently sexually active.    Medications taken in the past: Novolog  / Humalog  insulin  - gradually was able to stop since Mounjaro  started.   Starting weight = 255 lbs Total weight loss = 48 lbs   Wt Readings from Last 3 Encounters:  02/01/24 207 lb 4.8 oz (94 kg)  01/22/24 217 lb (98.4 kg)  01/12/24 220 lb (99.8 kg)    She is using Accu-Chek glucometer  She has also been using Dex Com 7 system. She prefers DexCom over [place] 3 system.   CGM Documentation: 03/07/2024 thru 03/20/2024 Days Worn: 10/14 (recommend 14 days) % Time CGM is active: 90% (goal >=70%) Average Glucose: 104 mg/dL  Glucose Management Indicator: 5.8% Glucose Variability: 18.8% (goal <36%) Time in Range:  - Time above range >250: 0% (typical goal: <5%) - Time above range 181-250: 1% (typical goal <20%) - Time in range 70-180 98% (typical goal >=70%) - Time below range 54-69: 1% (typical goal <4%) - Time below range: <1% (typical goal <1%)     Objective:  Lab Results  Component Value Date   HGBA1C 12.1 (H) 10/17/2023    Lab Results  Component Value Date   CREATININE 0.68 10/19/2023   BUN 11 10/19/2023   NA 134 (L) 10/19/2023   K 3.9 10/19/2023   CL 101 10/19/2023   CO2 22 10/19/2023    Lab Results  Component Value Date   CHOL 168 06/25/2022   HDL 38.70 (L) 06/25/2022   LDLCALC 104 (H) 06/25/2022   TRIG 125.0 06/25/2022   CHOLHDL 4 06/25/2022    Medications Reviewed Today   Medications were  not reviewed in this encounter       Assessment/Plan:  Diabetes: blood glucose per Continuous Glucose Monitor report has been at goal with estimate GMI of 5.8% however we will likely need to adjust her treatment regimen due to loss of insurance.   - Change from Mounjaro  15mg  to  Ozempic  2mg  weekly - provided 1 pen sample = 4 weeks supply, since Mounjaro  does not currently have a patient assistance program. Provided application for Ozempic  / Novo Nordisk medication assistance program.  - Continue Semglee10 units daily but we  will apply for medication assistance program for [place] since it is same manufacturer as Ozempic  to consolidate application process. Patient will start with [place] 10 units daily and increase as needed. She might need higher dose when she switches from Mounjaro  to Ozempic   - Continue to use DexCom 7 sensors - changing every 10 days. I provided her with 1 samples.  - Continue metformin  ER 500mg  2 tablets daily.    Medication Management:  - Patient is applying for [place] Medicaid since she has lost her insurane.  - Provided application for Novo Nordisk medication assistance program while she is waiting on Medicaid approval / response. Could get Ozempic  and [place] (would replace Mounjaro  and Semglee ) - She can get the following medications thru [facility] pharmacy at low cost:  Aripiprazole  5mg  - $10 / 30 days Buspirone  30mg  - 30mg  not on lower cost list but could get 15mg  for about $10 - $15 / month  Gabapentin  300mg  - $5 / #90 tabs Metformin  XR 500mg  - $5 / 30 days Norgestimate -ethinyl estradiol   - $10 / month Paroxetine  10mg  - $5 / 30 days Spironolactone  50mg  - $5 / 30 days    Follow Up Plan: 2 to 4 weeks  Melissa [name], PharmD Clinical Pharmacist [facility] Primary Care  Population Health [phone]

## 2024-03-22 NOTE — Telephone Encounter (Signed)
 Please disregard

## 2024-03-28 ENCOUNTER — Telehealth: Payer: Self-pay | Admitting: Pharmacist

## 2024-03-28 ENCOUNTER — Other Ambulatory Visit (HOSPITAL_COMMUNITY): Payer: Self-pay

## 2024-03-28 NOTE — Telephone Encounter (Signed)
 Received application from patient for [employer] for [place], pen needles and Ozempic  2mg .  Completed provider portion and forwarded to PCP to review and sign.

## 2024-03-29 ENCOUNTER — Telehealth: Admitting: Family Medicine

## 2024-04-04 ENCOUNTER — Telehealth: Payer: Self-pay | Admitting: Pharmacist

## 2024-04-04 NOTE — Telephone Encounter (Signed)
 Patient patient assistance program application for Novo Nordisk / Ozempic  2mg  was faxed to company.  Sent to batch scan.

## 2024-04-04 NOTE — Addendum Note (Signed)
 Addended by: [name] B on: 04/04/2024 09:22 AM   Modules accepted: Orders

## 2024-04-06 MED ORDER — OZEMPIC (2 MG/DOSE) 8 MG/3ML ~~LOC~~ SOPN: 2.0000 mg | PEN_INJECTOR | SUBCUTANEOUS | Status: DC

## 2024-04-06 NOTE — Telephone Encounter (Signed)
 Patient was unable to come by office to sign form today but will come tomorrow. Unfortunately I will be out of the office until 04/18/2024. Will forward to Dr Elisabeth CMA to request that she check my box later this week or next week to see if needed page has been signed and fax to [employer] at [phone]

## 2024-04-06 NOTE — Addendum Note (Signed)
 Addended by: [name] B on: 04/06/2024 03:48 PM   Modules accepted: Orders

## 2024-04-06 NOTE — Telephone Encounter (Signed)
 Yes ma'am. Making a reminder note now.

## 2024-04-06 NOTE — Telephone Encounter (Signed)
 Received the following message from the medication assistance team regarding patient's application for Ozempic .   Good morning, I checked on enrollment status for [name] DOB 08-Jan-1991 for Ozempic  and on Page 2 section c- missing a complete signature. will need to get signature to match other document and re-fax to [employer]- pt ID is [id number].  I cannot see the document - did not see it loaded into Media tab- unless I missed it.  Document has been sent to batch scan (sent 04/04/2024)  and I cannot review it. Has not been scanned to her chart yet.   Called patient and asked if she could come by today to sign page 2 of the Novo Nordisk application. Placed paperwork in envelope at from desk. Patient will come by today.

## 2024-04-12 ENCOUNTER — Encounter: Payer: Self-pay | Admitting: Family Medicine

## 2024-04-12 ENCOUNTER — Other Ambulatory Visit: Payer: Self-pay

## 2024-04-12 ENCOUNTER — Other Ambulatory Visit (HOSPITAL_COMMUNITY): Payer: Self-pay

## 2024-04-12 DIAGNOSIS — F909 Attention-deficit hyperactivity disorder, unspecified type: Secondary | ICD-10-CM

## 2024-04-12 DIAGNOSIS — F32A Depression, unspecified: Secondary | ICD-10-CM

## 2024-04-12 DIAGNOSIS — F39 Unspecified mood [affective] disorder: Secondary | ICD-10-CM

## 2024-04-12 MED ORDER — METHYLPHENIDATE HCL 20 MG PO TABS
20.0000 mg | ORAL_TABLET | Freq: Three times a day (TID) | ORAL | 0 refills | Status: DC
Start: 2024-04-12 — End: 2024-05-16

## 2024-04-12 MED ORDER — ARIPIPRAZOLE 5 MG PO TABS: 5.0000 mg | ORAL_TABLET | Freq: Every day | ORAL | 1 refills | Status: AC

## 2024-04-12 NOTE — Telephone Encounter (Signed)
 Requesting: Ritalin  20 MG Contract: none UDS: 10/23/22 Last Visit: 03/17/2024 Next Visit: Visit date not found Last Refill: 03/14/24  Please Advise

## 2024-04-22 ENCOUNTER — Other Ambulatory Visit (HOSPITAL_COMMUNITY): Payer: Self-pay

## 2024-04-22 ENCOUNTER — Other Ambulatory Visit: Payer: Self-pay | Admitting: Family Medicine

## 2024-04-22 MED ORDER — ONDANSETRON 4 MG PO TBDP
4.0000 mg | ORAL_TABLET | Freq: Four times a day (QID) | ORAL | 3 refills | Status: AC | PRN
Start: 2024-04-22 — End: ?

## 2024-04-25 ENCOUNTER — Telehealth: Payer: Self-pay

## 2024-04-25 NOTE — Telephone Encounter (Signed)
 Reached out to patient to come by and sign Pap application form for Ozempic  (Novo Nordisk) at the office. She acknowledged she would come by tomorrow and also bring verification of Income document that was needed per letter from [employer] as well.

## 2024-04-27 NOTE — Progress Notes (Addendum)
 Patient signed needed Page 2 of [employer]  Ozempic  application. Will fax to [employer] on the medication assistance team but I don't see any additional documents that patient left regarding income verification. She did sign Section C of the application which allows [employer] to verify her income  thru her credit profile (e-income verification). Per the [employer] Letter - it looks like that might be all they needed  Faxed needed Section C and Section D Novo patient assistance program and to medication assistance team.

## 2024-05-03 ENCOUNTER — Other Ambulatory Visit (HOSPITAL_COMMUNITY): Payer: Self-pay

## 2024-05-04 NOTE — Telephone Encounter (Signed)
 PAP: Patient assistance application for Ozempic  and [place] has been approved by PAP Companies: [employer] from 05/03/2024 to 04/23/2025. Medication should be delivered to PAP Delivery: Provider's office. For further shipping updates, please contact [employer] at [phone]. Patient ID is: not provided

## 2024-05-13 ENCOUNTER — Encounter: Payer: Self-pay | Admitting: Family Medicine

## 2024-05-13 DIAGNOSIS — F909 Attention-deficit hyperactivity disorder, unspecified type: Secondary | ICD-10-CM

## 2024-05-16 ENCOUNTER — Other Ambulatory Visit: Payer: Self-pay | Admitting: Family

## 2024-05-16 DIAGNOSIS — F909 Attention-deficit hyperactivity disorder, unspecified type: Secondary | ICD-10-CM

## 2024-05-16 MED ORDER — METHYLPHENIDATE HCL 20 MG PO TABS
20.0000 mg | ORAL_TABLET | Freq: Three times a day (TID) | ORAL | 0 refills | Status: AC
Start: 2024-05-16 — End: ?

## 2024-05-16 NOTE — Telephone Encounter (Signed)
 Requesting: Ritalin  20mg   Contract: 07/16/21 UDS: 10/23/22 Last Visit: 03/17/24 Next Visit: None Last Refill: 04/12/24 #90 and 0RF   Please Advise

## 2024-05-17 ENCOUNTER — Other Ambulatory Visit: Payer: Self-pay

## 2024-05-17 ENCOUNTER — Other Ambulatory Visit: Payer: Self-pay | Admitting: Family Medicine

## 2024-05-17 MED ORDER — PAROXETINE HCL 10 MG PO TABS: ORAL_TABLET | ORAL | 0 refills | Status: DC

## 2024-05-18 ENCOUNTER — Encounter: Payer: Self-pay | Admitting: Family Medicine

## 2024-05-18 MED ORDER — OZEMPIC (2 MG/DOSE) 8 MG/3ML ~~LOC~~ SOPN: 2.0000 mg | PEN_INJECTOR | SUBCUTANEOUS | 0 refills | Status: DC

## 2024-05-23 ENCOUNTER — Encounter: Payer: Self-pay | Admitting: Pharmacist

## 2024-05-23 ENCOUNTER — Other Ambulatory Visit: Payer: Self-pay | Admitting: Pharmacist

## 2024-05-23 MED ORDER — DEXCOM G7 SENSOR MISC: Status: AC

## 2024-06-01 ENCOUNTER — Other Ambulatory Visit (INDEPENDENT_AMBULATORY_CARE_PROVIDER_SITE_OTHER): Payer: Self-pay | Admitting: Pharmacist

## 2024-06-01 DIAGNOSIS — Z794 Long term (current) use of insulin: Secondary | ICD-10-CM

## 2024-06-01 DIAGNOSIS — E119 Type 2 diabetes mellitus without complications: Secondary | ICD-10-CM

## 2024-06-01 DIAGNOSIS — Z7984 Long term (current) use of oral hypoglycemic drugs: Secondary | ICD-10-CM

## 2024-06-01 NOTE — Progress Notes (Signed)
 06/01/24 Name: Melissa [name] MRN: [id number] DOB: 01-28-1991  Chief Complaint  Patient presents with   Diabetes   Medication Management    Melissa [name] is a 33 y.o. 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 She is married with 2 children. She had lost her insurance a few months ago but today she reports she will soon have insurance again thru her employer.    They were referred to the pharmacist by their PCP for assistance in managing diabetes and medication access.    Subjective: Patient was seen in ED 10/17/2023 and then hospitalized 02/162025 to 10/19/2023 for uncontrolled DM. She is newly diagnosed type 2 DM 10/17/2023. She initially was started on a basal / bolus insulin  regimen but has been able to stop using bolus insuln.    Care Team: Primary Care Provider: Domenica Harlene LABOR, MD ; Last Visit: 03/17/2024 Referred to Endocrinology -apptointment 06/23/2024 GYN: Nidia Daring, NP - initial appointment 01/26/2024  Medication Access/Adherence  Current Pharmacy:  [id number] [address], [place] - [address] [place] [address] Phone: [phone] Fax: [phone]   Patient reports affordability concerns with their medications: Yes  - she was just recently approved to received Ozempic  2mg  and Tresiba thru [employer] medication assistance program 05/26/2024. She has received her first delivery but has not pick up from our office yet.  Patient reports access/transportation concerns to their pharmacy: No  Patient reports adherence concerns with their medications:  No     Diabetes:  Current medications:  Metformin  XR 500mg  - 2 tablets = 1000mg  once a day in the evening Ozempic  2mg  weekly  Tresiba 10 units once a day (has not taken in the last 2 weeks)   She was previously on Mounjaro  15mg  weekly but stopped due to cost when she lost her insurance. She would like to go back to Moujaro once her insurace is  re-instates.She tolerated it well, has a little nausea the day of her injection.  She has prescription for generic Ortho-Cyclen but today she report she is not taking because she and her husband are not currently sexually active.   Medications taken in the past: Novolog  / Humalog  insulin  - gradually was able to stop since Mounjaro  started. Semglee  - stopped due to cost / loss of insurance.  Starting weight = 255 lbs Total weight loss = 48 lbs   Wt Readings from Last 3 Encounters:  02/01/24 207 lb 4.8 oz (94 kg)  01/22/24 217 lb (98.4 kg)  01/12/24 220 lb (99.8 kg)    She is using Accu-Chek glucometer  She has also been using Dex Com 7 system. She prefers DexCom over [place] 3 system.   CGM Documentation: 05/08/2024 to 05/21/2024 Days Worn: 11/14 (recommend 14 days) % Time CGM is active: 95% (goal >=70%) Average Glucose: 102 mg/dL  Glucose Management Indicator: 5.7% Glucose Variability: 16.1% (goal <36%) Time in Range:  - Time above range >250: 0% (typical goal: <5%) - Time above range 181-250: 0% (typical goal <20%) - Time in range 70-180 99% (typical goal >=70%) - Time below range 54-69: 1% (typical goal <4%) - Time below range: 0% (typical goal <1%)     Objective:  Lab Results  Component Value Date   HGBA1C 12.1 (H) 10/17/2023    Lab Results  Component Value Date   CREATININE 0.68 10/19/2023   BUN 11 10/19/2023   NA 134 (L) 10/19/2023   K 3.9 10/19/2023  CL 101 10/19/2023   CO2 22 10/19/2023    Lab Results  Component Value Date   CHOL 168 06/25/2022   HDL 38.70 (L) 06/25/2022   LDLCALC 104 (H) 06/25/2022   TRIG 125.0 06/25/2022   CHOLHDL 4 06/25/2022    Medications Reviewed Today     Reviewed by [name], RPH-CPP (Pharmacist) on 06/01/24 at [phone]  Med List Status: <None>   Medication Order Taking? Sig Documenting Provider Last Dose Status Informant  albuterol  (VENTOLIN  HFA) 108 (90 Base) MCG/ACT inhaler [id number]  Inhale 2 puffs into the lungs every  6 (six) hours as needed for wheezing or shortness of breath. Daryl Setter, NP  Active Self, Pharmacy Records           Med Note [facility]   Tue Oct 27, 2023  9:22 AM)    ARIPiprazole  (ABILIFY ) 5 MG tablet [id number]  Take 1 tablet (5 mg total) by mouth daily. Douglass Caul B, FNP  Active   aspirin-acetaminophen -caffeine  (EXCEDRIN MIGRAINE) 250-250-65 MG tablet [id number]  Take 3 tablets by mouth every 6 (six) hours as needed for headache. [provider]  Active Self, Pharmacy Records  Blood Glucose Monitoring Suppl (BLOOD GLUCOSE MONITOR SYSTEM) w/Device KIT [id number]  Use to check blood sugar as directed  Patient taking differently: 1 each by Does not apply route 3 (three) times daily. Freestyle Lite   Laurence Locus, DO  Active   Blood Glucose Monitoring Suppl (FREESTYLE LITE) DEVI [id number]  Use to check blood glucose as indicated by Continuous Glucose Monitor for confirmation up to twice a day. Domenica Harlene LABOR, MD  Active   busPIRone  (BUSPAR ) 30 MG tablet [id number]  TAKE 1 TABLET BY MOUTH 2 TIMES DAILY. Domenica Harlene LABOR, MD  Active   clonazePAM  (KLONOPIN ) 1 MG tablet [id number]  Take 0.5-2 tablets (0.5-2 mg total) by mouth 2 (two) times daily as needed. for anxiety Domenica Harlene LABOR, MD  Active   Continuous Glucose Sensor (DEXCOM G7 SENSOR) MISC [id number]  Use to check blood glucose continuously and assist in administration of short acting insulin  / Novolog . Carla, Doris Gruhn, RPH-CPP  Active   gabapentin  (NEURONTIN ) 300 MG capsule [id number]  Take 1 capsule (300 mg total) by mouth 3 (three) times daily. Domenica Harlene LABOR, MD  Active   Glucose Blood (BLOOD GLUCOSE TEST STRIPS) STRP [id number]  Use to check blood glucose when indicated by Continuous Glucose Monitor to making dosing decisions. May use up to twice a day Domenica Harlene LABOR, MD  Active   insulin  degludec (TRESIBA FLEXTOUCH) 100 UNIT/ML FlexTouch Pen [id number] Yes Inject 12 Units into the skin daily. Novo Nordisk patient assistance  program starting 05/26/2024 [provider]  Active    Patient not taking:   Discontinued 06/01/24 0948 (Change in therapy)   Insulin  Pen Needle (PEN NEEDLES) 31G X 5 MM MISC [id number]  1 each by Does not apply route daily. Use to inject insulin  once a day Domenica Harlene LABOR, MD  Active   Lancets MISC [id number]  1 each by Does not apply route in the morning and at bedtime. Domenica Harlene LABOR, MD  Active   metFORMIN  (GLUCOPHAGE -XR) 500 MG 24 hr tablet [id number]  Take 1 tablet (500 mg total) by mouth daily for 7 days, THEN 2 tablets (1,000 mg total) daily. Take with largest meal of the day. *Replaces regular release metformin .*  Patient taking differently: Take 2 tablets (1,000 mg total) daily. Take with largest meal of the day. *Replaces regular release metformin .*  Domenica Harlene LABOR, MD  Expired 01/31/24 2359   methylphenidate  (RITALIN ) 20 MG tablet [id number]  Take 1 tablet (20 mg total) by mouth 3 (three) times daily with meals. Webb, Padonda B, FNP  Active   norgestimate -ethinyl estradiol  (ORTHO-CYCLEN) 0.25-35 MG-MCG tablet [id number]  Take 1 tablet by mouth daily. Domenica Harlene LABOR, MD  Active   nystatin  cream (MYCOSTATIN ) [id number]  Apply 1 Application topically 2 (two) times daily. Domenica Harlene LABOR, MD  Active   ondansetron  (ZOFRAN -ODT) 4 MG disintegrating tablet [id number]  Take 1 tablet (4 mg total) by mouth every 6 (six) hours as needed for nausea or vomiting. Domenica Harlene LABOR, MD  Active   PARoxetine  (PAXIL ) 10 MG tablet [id number]  Take 2 tablets (20 mg total) by mouth in the morning AND 1 tablet (10 mg total) every evening. Domenica Harlene LABOR, MD  Active   Semaglutide , 2 MG/DOSE, (OZEMPIC , 2 MG/DOSE,) 8 MG/3ML SOPN [id number]  Inject 2 mg into the skin once a week. [name], RPH-CPP  Active   spironolactone  (ALDACTONE ) 50 MG tablet [id number]  Take 1 tablet (50 mg total) by mouth 2 (two) times daily. Will start with 50 mg twice daily, and increase to 100 mg twice daily as needed Delores Nidia CROME, FNP  Active               Assessment/Plan:  Diabetes: blood glucose per Continuous Glucose Monitor report has been at goal with estimate GMI of 5.7% - she is due to have A1c rechecked but we have not done so due to loss of insurance.  - Continue Ozempic  2mg  weekly (will consider change back to Mounjaro  15mg  weekly due to patient preference once her insurnace is re-instates and if it will be covered by her new plan)  - Hold on to [place] you received from Novo Nordisk in case it is needed again in the future. - Continue to use DexCom 7 sensors - changing every 10 days.  - Continue metformin  ER 500mg  2 tablets daily.    Follow Up Plan: 2 to 4 weeks  Milling Carla, PharmD Clinical Pharmacist [facility]  Population Health [phone]

## 2024-06-07 NOTE — Telephone Encounter (Signed)
Insurance has been added and verified

## 2024-06-13 ENCOUNTER — Other Ambulatory Visit (HOSPITAL_COMMUNITY): Payer: Self-pay

## 2024-06-13 ENCOUNTER — Telehealth: Payer: Self-pay | Admitting: *Deleted

## 2024-06-13 ENCOUNTER — Telehealth: Payer: Self-pay

## 2024-06-13 MED ORDER — TIRZEPATIDE 15 MG/0.5ML ~~LOC~~ SOAJ: 15.0000 mg | SUBCUTANEOUS | 2 refills | Status: DC

## 2024-06-13 NOTE — Telephone Encounter (Signed)
 Pharmacy Patient Advocate Encounter   Received notification from CoverMyMeds that prior authorization for Mounjaro  15MG /0.5ML Auto-injectors is required/requested.   Insurance verification completed.   The patient is insured through [employer].   Per test claim: PA required; PA submitted to above mentioned insurance via Latent Key/confirmation #/EOC [id number] Status is pending

## 2024-06-13 NOTE — Telephone Encounter (Signed)
 Patient came into office today.  She would like to start back on the Mounjaro .  She only picked up 2 of the Ozempic  to hold over if she needs prior auth.    Spoke with Tammy and she advised to send in Mounjaro  15mg .  Advise pt that if she has any nausea or abdominal pain to let us  know and that we may have to take it back down to 10mg .  Rx sent in and patient notified via mychart.

## 2024-06-16 ENCOUNTER — Other Ambulatory Visit: Payer: Self-pay | Admitting: Family Medicine

## 2024-06-16 DIAGNOSIS — F909 Attention-deficit hyperactivity disorder, unspecified type: Secondary | ICD-10-CM

## 2024-06-16 NOTE — Telephone Encounter (Signed)
 Copied from CRM [id number]. Topic: Clinical - Medication Refill >> Jun 16, 2024  3:30 PM [place] E wrote: Medication: methylphenidate  (RITALIN ) 20 MG tablet  Has the patient contacted their pharmacy? No (Agent: If no, request that the patient contact the pharmacy for the refill. If patient does not wish to contact the pharmacy document the reason why and proceed with request.) (Agent: If yes, when and what did the pharmacy advise?)  This is the patient's preferred pharmacy:  [id number] [address], [place] - [address] [place] [address] Phone: [phone] Fax: [phone]  Is this the correct pharmacy for this prescription? Yes If no, delete pharmacy and type the correct one.   Has the prescription been filled recently? No  Is the patient out of the medication? Yes  Has the patient been seen for an appointment in the last year OR does the patient have an upcoming appointment? Yes  Can we respond through MyChart? Yes  Agent: Please be advised that Rx refills may take up to 3 business days. We ask that you follow-up with your pharmacy.

## 2024-06-17 ENCOUNTER — Other Ambulatory Visit: Payer: Self-pay | Admitting: Family

## 2024-06-17 DIAGNOSIS — F909 Attention-deficit hyperactivity disorder, unspecified type: Secondary | ICD-10-CM

## 2024-06-17 MED ORDER — METHYLPHENIDATE HCL 20 MG PO TABS: 20.0000 mg | ORAL_TABLET | Freq: Three times a day (TID) | ORAL | 0 refills | Status: DC

## 2024-06-17 NOTE — Telephone Encounter (Signed)
 Duplicate request. Refilled by Padonda this morning.

## 2024-06-20 ENCOUNTER — Other Ambulatory Visit: Payer: Self-pay | Admitting: Family Medicine

## 2024-06-20 ENCOUNTER — Telehealth: Payer: Self-pay

## 2024-06-20 MED ORDER — TIRZEPATIDE 10 MG/0.5ML ~~LOC~~ SOAJ: 10.0000 mg | SUBCUTANEOUS | 2 refills | Status: AC

## 2024-06-20 MED ORDER — TIRZEPATIDE 2.5 MG/0.5ML ~~LOC~~ SOAJ: 2.5000 mg | SUBCUTANEOUS | 1 refills | Status: DC

## 2024-06-20 NOTE — Addendum Note (Signed)
 Addended by: DORLENE CHIQUITA RAMAN on: 06/20/2024 01:39 PM   Modules accepted: Orders

## 2024-06-20 NOTE — Telephone Encounter (Signed)
 Copied from CRM #[id number]. Topic: Clinical - Medication Question >> Jun 20, 2024  8:49 AM Melissa [name] wrote: Reason for CRM: Patient would like to know if a lower dose of tirzepatide  (MOUNJARO ) 15 MG/0.5ML Pen can be called into the pharmacy. Patient is scare to take higher dose since she has been off of medication for 8 weeks.    Please contact patient to advise. CB#[phone] (H)

## 2024-06-20 NOTE — Telephone Encounter (Signed)
 Pt notified of 2.5 mg prescript and stated  I dont want to go that low , I want to try 10 mg  .SABRA 10 mg sent to pharmacy

## 2024-06-22 ENCOUNTER — Other Ambulatory Visit (HOSPITAL_COMMUNITY): Payer: Self-pay

## 2024-06-22 ENCOUNTER — Encounter: Payer: Self-pay | Admitting: Pharmacist

## 2024-06-22 ENCOUNTER — Other Ambulatory Visit (INDEPENDENT_AMBULATORY_CARE_PROVIDER_SITE_OTHER): Admitting: Pharmacist

## 2024-06-22 DIAGNOSIS — E66813 Obesity, class 3: Secondary | ICD-10-CM

## 2024-06-22 DIAGNOSIS — E1165 Type 2 diabetes mellitus with hyperglycemia: Secondary | ICD-10-CM

## 2024-06-22 NOTE — Telephone Encounter (Signed)
 Pharmacy Patient Advocate Encounter  Received notification from [employer] that Prior Authorization for  Mounjaro  15MG /0.5ML Auto-injectors  has been APPROVED from 06/14/24 to 08/31/2098   PA #/Case ID/Reference #: [id number]

## 2024-06-22 NOTE — Progress Notes (Signed)
 06/22/24 Name: Melissa [name] MRN: [id number] DOB: 1991-05-28  Chief Complaint  Patient presents with   Medication Management   Diabetes    Melissa [name] is a 33 y.o. year old female who presented for a MyChart Visit She had lost her insurance a few months ago but she has qualify   They were referred to the pharmacist by their PCP for assistance in managing diabetes and medication access.    Subjective: Patient was seen in ED 10/17/2023 and then hospitalized 02/162025 to 10/19/2023 for uncontrolled DM. She is newly diagnosed type 2 DM 10/17/2023. She initially was started on a basal / bolus insulin  regimen but has been able to stop using bolus insuln.    Care Team: Primary Care Provider: Domenica Harlene LABOR, [name] ; Last Visit: 03/17/2024 Referred to Endocrinology -apptointment 06/23/2024 GYN: Melissa Daring, [name] - initial appointment 01/26/2024  Medication Access/Adherence  Current Pharmacy:  [id number] [address], [place] - [address] [place] [address] Phone: [phone] Fax: [phone]   Patient reports affordability concerns with their medications: Yes  - she was just recently approved to received Ozempic  2mg  and Tresiba thru Novo medication assistance program 05/26/2024. She has received her first delivery.  Since she has new insurance,she has requested to restart Mounjaro  which she felt worked better than Ozempic . Patient reports access/transportation concerns to their pharmacy: No  Patient reports adherence concerns with their medications:  No     Diabetes:  Current medications:  Metformin  XR 500mg  - 2 tablets = 1000mg  once a day in the evening Ozempic  2mg  weekly thereafter.  Tresiba 10 units once a day (has not taken in the last 4 weeks.   She was previously on Mounjaro  15mg  weekly but stopped due to cost when she lost her insurance. She would like to go back to Moujaro since her insurace is re-instated. Plan to start with Mounjaro   10mg  weekly 07/04/2024 for 1 month, then 12.5mg  weekly for 1 month, then 15mg  weekly   She has prescription for generic Ortho-Cyclen but today she report she is not taking because she and her husband are not currently sexually active.   Medications taken in the past: Novolog  / Humalog  insulin  - gradually was able to stop since Mounjaro  started. Semglee  - stopped due to cost / loss of insurance.  Starting weight = 255 lbs Total weight loss = 48 lbs   Wt Readings from Last 3 Encounters:  02/01/24 207 lb 4.8 oz (94 kg)  01/22/24 217 lb (98.4 kg)  01/12/24 220 lb (99.8 kg)    She is using Accu-Chek glucometer  She has also been using Dex Com 7 system. She prefers DexCom over [place] 3 system.   CGM Documentation:  Days Worn: 10/14 (recommend 14 days) % Time CGM is active: 91% (goal >=70%) Average Glucose: 103 mg/dL  Glucose Management Indicator: 5.8% Glucose Variability: 16.8% (goal <36%) Time in Range:  - Time above range >250: 0% (typical goal: <5%) - Time above range 181-250: 1% (typical goal <20%) - Time in range 70-180 99% (typical goal >=70%) - Time below range 54-69: 0% (typical goal <4%) - Time below range: 0% (typical goal <1%)         Objective:  Lab Results  Component Value Date   HGBA1C 12.1 (H) 10/17/2023    Lab Results  Component Value Date   CREATININE 0.68 10/19/2023   BUN 11 10/19/2023   NA 134 (L) 10/19/2023   K 3.9 10/19/2023   CL 101 10/19/2023  CO2 22 10/19/2023    Lab Results  Component Value Date   CHOL 168 06/25/2022   HDL 38.70 (L) 06/25/2022   LDLCALC 104 (H) 06/25/2022   TRIG 125.0 06/25/2022   CHOLHDL 4 06/25/2022    Medications Reviewed Today     Reviewed by Melissa [name], [name] (Pharmacist) on 06/22/24 at 1226  Med List Status: <None>   Medication Order Taking? Sig Documenting Provider Last Dose Status Informant  albuterol  (VENTOLIN  HFA) 108 (90 Base) MCG/ACT inhaler [id number]  Inhale 2 puffs into the lungs every 6 (six)  hours as needed for wheezing or shortness of breath. Melissa [name]  Active Self, Pharmacy Records           Med Note [facility] [name]   Tue Oct 27, 2023  9:22 AM)    ARIPiprazole  (ABILIFY ) 5 MG tablet [id number]  Take 1 tablet (5 mg total) by mouth daily. Webb, Melissa B, FNP  Active   aspirin-acetaminophen -caffeine  (EXCEDRIN MIGRAINE) 250-250-65 MG tablet [id number]  Take 3 tablets by mouth every 6 (six) hours as needed for headache. Provider, Historical, [name]  Active Self, Pharmacy Records  Blood Glucose Monitoring Suppl (BLOOD GLUCOSE MONITOR SYSTEM) w/Device KIT [id number]  Use to check blood sugar as directed  Patient taking differently: 1 each by Does not apply route 3 (three) times daily. Freestyle Lite   Melissa Locus, DO  Active   Blood Glucose Monitoring Suppl (FREESTYLE LITE) DEVI [id number]  Use to check blood glucose as indicated by Continuous Glucose Monitor for confirmation up to twice a day. Melissa [name]  Active   busPIRone  (BUSPAR ) 30 MG tablet [id number]  TAKE 1 TABLET BY MOUTH 2 TIMES DAILY. Melissa [name]  Active   clonazePAM  (KLONOPIN ) 1 MG tablet [id number]  Take 0.5-2 tablets (0.5-2 mg total) by mouth 2 (two) times daily as needed. for anxiety Melissa [name]  Active   Continuous Glucose Sensor (DEXCOM G7 SENSOR) MISC [id number]  Use to check blood glucose continuously and assist in administration of short acting insulin  / Novolog . Melissa [name]  Active   gabapentin  (NEURONTIN ) 300 MG capsule [id number]  Take 1 capsule (300 mg total) by mouth 3 (three) times daily. Melissa [name]  Active   Glucose Blood (BLOOD GLUCOSE TEST STRIPS) STRP [id number]  Use to check blood glucose when indicated by Continuous Glucose Monitor to making dosing decisions. May use up to twice a day Melissa [name]  Active   insulin  degludec (TRESIBA FLEXTOUCH) 100 UNIT/ML FlexTouch Pen [id number]  Inject 12 Units into the skin daily. Novo Nordisk patient assistance program  starting 05/26/2024  Patient not taking: Reported on 06/22/2024   Provider, Historical, [name]  Active   Insulin  Pen Needle (PEN NEEDLES) 31G X 5 MM MISC [id number]  1 each by Does not apply route daily. Use to inject insulin  once a day Melissa [name]  Active   Lancets MISC [id number]  1 each by Does not apply route in the morning and at bedtime. Melissa [name]  Active   metFORMIN  (GLUCOPHAGE -XR) 500 MG 24 hr tablet [id number]  Take 1 tablet (500 mg total) by mouth daily for 7 days, THEN 2 tablets (1,000 mg total) daily. Take with largest meal of the day. *Replaces regular release metformin .*  Patient taking differently: Take 2 tablets (1,000 mg total) daily. Take with largest meal of the day. *Replaces regular release metformin .DEWAINE Melissa [name]  Expired 01/31/24 [phone]  methylphenidate  (RITALIN ) 20 MG tablet [id number]  Take 1 tablet (20 mg total) by mouth 3 (three) times daily with meals. Webb, Melissa B, FNP  Active   norgestimate -ethinyl estradiol  (ORTHO-CYCLEN) 0.25-35 MG-MCG tablet [id number]  Take 1 tablet by mouth daily. Melissa [name]  Active   nystatin  cream (MYCOSTATIN ) [id number]  Apply 1 Application topically 2 (two) times daily. Melissa [name]  Active   ondansetron  (ZOFRAN -ODT) 4 MG disintegrating tablet [id number]  Take 1 tablet (4 mg total) by mouth every 6 (six) hours as needed for nausea or vomiting. Melissa [name]  Active   PARoxetine  (PAXIL ) 10 MG tablet [id number]  Take 2 tablets (20 mg total) by mouth in the morning AND 1 tablet (10 mg total) every evening. Melissa [name]  Active   spironolactone  (ALDACTONE ) 50 MG tablet [id number]  Take 1 tablet (50 mg total) by mouth 2 (two) times daily. Will start with 50 mg twice daily, and increase to 100 mg twice daily as needed Delores Melissa CROME, FNP  Active   tirzepatide  (MOUNJARO ) 10 MG/0.5ML Pen [id number]  Inject 10 mg into the skin once a week. Melissa [name]  Active               Assessment/Plan:   Diabetes: blood glucose per Continuous Glucose Monitor report has been at goal with estimate GMI of 5.8% - she is due to have A1c rechecked but we have not done so due to loss of insurance.  - Continue Ozempic  2mg  weekly until insurance will allow Mounjaro  10mg  to be filled 11/3/205. Then will plan to restart Mounjaro  10mg  weekly for 1 month, then 12.5mg  weekly for 1 month, then 15mg  weekly thereafter. - Discontinue [place] - Continue to use DexCom 7 sensors - changing every 10 days.  - Continue metformin  ER 500mg  2 tablets daily.   - A1c is due - expect it will be check at endo visit tomorrow 06/23/2024  Follow Up Plan: 2 to 4 weeks  Madelin Ray, PharmD Clinical Pharmacist [facility]  Population Health [phone]

## 2024-06-23 ENCOUNTER — Ambulatory Visit: Admitting: Endocrinology

## 2024-06-24 ENCOUNTER — Other Ambulatory Visit (HOSPITAL_COMMUNITY): Payer: Self-pay

## 2024-06-29 ENCOUNTER — Emergency Department (HOSPITAL_COMMUNITY)
Admission: EM | Admit: 2024-06-29 | Discharge: 2024-06-29 | Discharge status: Against Medical Advice | Attending: Emergency Medicine | Admitting: Emergency Medicine

## 2024-06-29 ENCOUNTER — Encounter (HOSPITAL_COMMUNITY): Payer: Self-pay | Admitting: Emergency Medicine

## 2024-06-29 DIAGNOSIS — M25562 Pain in left knee: Secondary | ICD-10-CM | POA: Diagnosis not present

## 2024-06-29 DIAGNOSIS — M549 Dorsalgia, unspecified: Secondary | ICD-10-CM | POA: Insufficient documentation

## 2024-06-29 DIAGNOSIS — S60812A Abrasion of left wrist, initial encounter: Secondary | ICD-10-CM | POA: Diagnosis not present

## 2024-06-29 DIAGNOSIS — M25561 Pain in right knee: Secondary | ICD-10-CM | POA: Insufficient documentation

## 2024-06-29 DIAGNOSIS — S6992XA Unspecified injury of left wrist, hand and finger(s), initial encounter: Secondary | ICD-10-CM | POA: Diagnosis present

## 2024-06-29 DIAGNOSIS — Z5321 Procedure and treatment not carried out due to patient leaving prior to being seen by health care provider: Secondary | ICD-10-CM | POA: Insufficient documentation

## 2024-06-29 DIAGNOSIS — Y92410 Unspecified street and highway as the place of occurrence of the external cause: Secondary | ICD-10-CM | POA: Insufficient documentation

## 2024-06-29 NOTE — ED Notes (Signed)
 Pt in bathroom, unable to obtain vitals until she comes out.

## 2024-06-29 NOTE — ED Notes (Signed)
 Pt decided that she did not want you stay. Pt stated I think I'm okay  Pt was advised that it would be AMA is she left. Pt was adamant about leaving.  Pt signed AMA document and walked out of the front door.
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
## 2024-06-29 NOTE — ED Notes (Signed)
 Pt removed C-collar on her own

## 2024-07-09 ENCOUNTER — Other Ambulatory Visit: Payer: Self-pay | Admitting: Obstetrics and Gynecology

## 2024-07-09 DIAGNOSIS — L680 Hirsutism: Secondary | ICD-10-CM

## 2024-07-26 ENCOUNTER — Other Ambulatory Visit: Payer: Self-pay | Admitting: Family Medicine

## 2024-07-26 DIAGNOSIS — F909 Attention-deficit hyperactivity disorder, unspecified type: Secondary | ICD-10-CM

## 2024-07-26 MED ORDER — METHYLPHENIDATE HCL 20 MG PO TABS: 20.0000 mg | ORAL_TABLET | Freq: Three times a day (TID) | ORAL | 0 refills | Status: DC

## 2024-07-26 NOTE — Telephone Encounter (Unsigned)
 Copied from CRM #[id number]. Topic: Clinical - Medication Refill >> Jul 26, 2024 12:05 PM Dedra B wrote: Medication: methylphenidate  (RITALIN ) 20 MG tablet  Has the patient contacted their pharmacy? no refills   This is the patient's preferred pharmacy:  [facility] [address] [place] - [address] [place] [address] Phone: [phone] Fax: [phone]  Is this the correct pharmacy for this prescription? Yes   Has the prescription been filled recently? No  Is the patient out of the medication? Yes  Has the patient been seen for an appointment in the last year OR does the patient have an upcoming appointment? Yes  Can we respond through MyChart? Yes  Agent: Please be advised that Rx refills may take up to 3 business days. We ask that you follow-up with your pharmacy.

## 2024-07-26 NOTE — Telephone Encounter (Signed)
 Requesting: methylphenidate  20mg   Contract:07/16/21 UDS:10/23/22 Last Visit: 03/17/24 Next Visit: None Last Refill: 06/17/24 #90 and 0RF   Please Advise

## 2024-08-09 ENCOUNTER — Ambulatory Visit: Admitting: Endocrinology

## 2024-08-24 ENCOUNTER — Other Ambulatory Visit: Payer: Self-pay | Admitting: Family Medicine

## 2024-08-24 DIAGNOSIS — F909 Attention-deficit hyperactivity disorder, unspecified type: Secondary | ICD-10-CM

## 2024-08-24 MED ORDER — BUSPIRONE HCL 30 MG PO TABS: 30.0000 mg | ORAL_TABLET | Freq: Two times a day (BID) | ORAL | 0 refills | Status: AC

## 2024-08-24 MED ORDER — PAROXETINE HCL 10 MG PO TABS: ORAL_TABLET | ORAL | 0 refills | Status: AC

## 2024-08-24 NOTE — Telephone Encounter (Signed)
 Unable to pend Ozempic 

## 2024-08-24 NOTE — Telephone Encounter (Signed)
 Copied from CRM #[id number]. Topic: Clinical - Prescription Issue >> Aug 24, 2024 11:49 AM China J wrote: Reason for CRM: The patient is out of her methylphenidate  (RITALIN ) 20 MG tablet and emphasized needing this medication before Christmas. She said that she will not be able to function at work correctly without the medication. I let her know that Dr. Domenica is out of office (per CAL) and we would need her to approve this.

## 2024-08-24 NOTE — Telephone Encounter (Unsigned)
 Copied from CRM #[id number]. Topic: Clinical - Medication Refill >> Aug 24, 2024 11:43 AM [place] J wrote: Medication:  methylphenidate  (RITALIN ) 20 MG tablet busPIRone  (BUSPAR ) 30 MG tablet PARoxetine  (PAXIL ) 10 MG tablet Ozempic  (highest dose)  Has the patient contacted their pharmacy? No (Agent: If no, request that the patient contact the pharmacy for the refill. If patient does not wish to contact the pharmacy document the reason why and proceed with request.) (Agent: If yes, when and what did the pharmacy advise?)  This is the patient's preferred pharmacy:  [id number] [place], [place] - [address] [place] [address] Phone: [phone] Fax: [phone]  Is this the correct pharmacy for this prescription? Yes If no, delete pharmacy and type the correct one.   Has the prescription been filled recently? No  Is the patient out of the medication? Yes  Has the patient been seen for an appointment in the last year OR does the patient have an upcoming appointment? Yes  Can we respond through MyChart? No  Agent: Please be advised that Rx refills may take up to 3 business days. We ask that you follow-up with your pharmacy.

## 2024-08-24 NOTE — Telephone Encounter (Signed)
 Requesting: Ritalin  20 MG Contract: None UDS: 10/23/2022 Last Visit: 10/29/2023 Next Visit: Visit date not found Last Refill: 07/26/2024  Please Advise

## 2024-08-26 MED ORDER — METHYLPHENIDATE HCL 20 MG PO TABS: 20.0000 mg | ORAL_TABLET | Freq: Three times a day (TID) | ORAL | 0 refills | Status: DC

## 2024-08-30 ENCOUNTER — Encounter: Payer: Self-pay | Admitting: Pharmacist

## 2024-09-06 ENCOUNTER — Other Ambulatory Visit: Payer: Self-pay | Admitting: Family Medicine

## 2024-09-06 ENCOUNTER — Other Ambulatory Visit: Payer: Self-pay

## 2024-09-06 NOTE — Telephone Encounter (Signed)
 Requesting: clonazepam  1mg  Contract:07/16/21 UDS: 10/23/22 Last Visit: 03/17/24 Next Visit: None Last Refill: 03/14/24 #60 and 2RF   Please Advise

## 2024-09-06 NOTE — Progress Notes (Signed)
 Open in error

## 2024-09-06 NOTE — Telephone Encounter (Signed)
 Pt scheduled appointment on 10/20/2024 @ 11:20 AM.

## 2024-09-27 ENCOUNTER — Telehealth: Payer: Self-pay | Admitting: Pharmacist

## 2024-09-27 NOTE — Telephone Encounter (Signed)
 Patient sent Clinical Pharmacist Practitioner a message thru MyChart requesting re-enrollment in [employer] patient assistance program. She was enrolled at the end of 2025 when she lost her insurance but she notified our office that she regained insurance coverage and as required by medication assistance program we notified [employer] and her enrollment was suspended.  Patient has lost insurance again. She won't qualify for insurance again until mid to late February.   Will get Rx Assist Team to send her application for [employer] patient assistance program / Ozempic  but it usually take 2 to 4 weeks to process and receive first delivery.  Patient is aware that timing might cause her to be ineligible for assistance.

## 2024-09-28 ENCOUNTER — Encounter: Payer: Self-pay | Admitting: Family Medicine

## 2024-09-28 ENCOUNTER — Other Ambulatory Visit: Payer: Self-pay | Admitting: Family

## 2024-09-28 DIAGNOSIS — F909 Attention-deficit hyperactivity disorder, unspecified type: Secondary | ICD-10-CM

## 2024-09-28 MED ORDER — METHYLPHENIDATE HCL 20 MG PO TABS: 20.0000 mg | ORAL_TABLET | Freq: Three times a day (TID) | ORAL | 0 refills | Status: AC

## 2024-09-28 NOTE — Telephone Encounter (Signed)
 Requesting: Ritalin  20mg   Contract: None seen in media UDS:10/23/22 Last Visit: 03/17/24 Next Visit: 10/20/24 Last Refill: 08/26/24 #90 and 0RF   Please Advise

## 2024-10-02 ENCOUNTER — Telehealth: Admitting: Family

## 2024-10-02 DIAGNOSIS — B3731 Acute candidiasis of vulva and vagina: Secondary | ICD-10-CM

## 2024-10-02 MED ORDER — FLUCONAZOLE 150 MG PO TABS: 150.0000 mg | ORAL_TABLET | ORAL | 0 refills | Status: DC | PRN

## 2024-10-02 NOTE — Addendum Note (Signed)
 Addended by: LAVELL LYE A on: 10/02/2024 05:44 PM   Modules accepted: Orders

## 2024-10-02 NOTE — Progress Notes (Signed)

## 2024-10-03 MED ORDER — FLUCONAZOLE 150 MG PO TABS: 150.0000 mg | ORAL_TABLET | ORAL | 0 refills | Status: AC | PRN

## 2024-10-17 ENCOUNTER — Other Ambulatory Visit

## 2024-10-20 ENCOUNTER — Ambulatory Visit: Admitting: Family Medicine

## 2024-12-12 ENCOUNTER — Ambulatory Visit: Admitting: Family Medicine
